# Patient Record
Sex: Male | Born: 1945 | Smoking: Former smoker
Health system: Southern US, Community
[De-identification: ages and names within clinical notes are randomized; demographics above are authoritative.]

## PROBLEM LIST (undated history)

## (undated) DIAGNOSIS — J9691 Respiratory failure, unspecified with hypoxia: Secondary | ICD-10-CM

## (undated) DIAGNOSIS — E7800 Pure hypercholesterolemia, unspecified: Secondary | ICD-10-CM

## (undated) DIAGNOSIS — M1990 Unspecified osteoarthritis, unspecified site: Secondary | ICD-10-CM

## (undated) DIAGNOSIS — N200 Calculus of kidney: Secondary | ICD-10-CM

## (undated) DIAGNOSIS — E669 Obesity, unspecified: Secondary | ICD-10-CM

## (undated) DIAGNOSIS — I10 Essential (primary) hypertension: Secondary | ICD-10-CM

## (undated) DIAGNOSIS — I509 Heart failure, unspecified: Secondary | ICD-10-CM

## (undated) DIAGNOSIS — G4730 Sleep apnea, unspecified: Secondary | ICD-10-CM

---

## 2005-06-22 ENCOUNTER — Ambulatory Visit: Payer: Self-pay | Admitting: Gastroenterology

## 2005-07-06 ENCOUNTER — Ambulatory Visit: Payer: Self-pay | Admitting: Gastroenterology

## 2010-12-28 ENCOUNTER — Other Ambulatory Visit: Payer: Self-pay | Admitting: Cardiology

## 2010-12-28 ENCOUNTER — Ambulatory Visit
Admission: RE | Admit: 2010-12-28 | Discharge: 2010-12-28 | Disposition: A | Discharge status: Home / Self Care | Payer: Medicare Other | Source: Ambulatory Visit | Attending: Cardiology | Admitting: Cardiology

## 2010-12-28 DIAGNOSIS — R0602 Shortness of breath: Secondary | ICD-10-CM

## 2011-02-08 ENCOUNTER — Ambulatory Visit: Payer: Medicare Other | Admitting: *Deleted

## 2011-08-16 ENCOUNTER — Telehealth: Payer: Self-pay | Admitting: Internal Medicine

## 2011-08-16 NOTE — Telephone Encounter (Signed)
lmonvm adviisng the pt of his new pt appt in may with dr Julien Nordmann and for him to return my call to confirm the message

## 2011-08-18 ENCOUNTER — Telehealth: Payer: Self-pay | Admitting: Internal Medicine

## 2011-08-18 NOTE — Telephone Encounter (Signed)
Dave Brown called from dr Donato Heinz office regarding the pt is aware of the appt on 08/30/2011 with dr Julien Nordmann.

## 2011-08-19 ENCOUNTER — Telehealth: Payer: Self-pay | Admitting: Internal Medicine

## 2011-08-19 NOTE — Telephone Encounter (Signed)
Referred by Dr. Corliss Parish Dx- M-Spike

## 2011-08-30 ENCOUNTER — Other Ambulatory Visit (HOSPITAL_BASED_OUTPATIENT_CLINIC_OR_DEPARTMENT_OTHER): Payer: Medicare Other | Admitting: Lab

## 2011-08-30 ENCOUNTER — Ambulatory Visit (HOSPITAL_BASED_OUTPATIENT_CLINIC_OR_DEPARTMENT_OTHER): Payer: Medicare Other | Admitting: Internal Medicine

## 2011-08-30 ENCOUNTER — Telehealth: Payer: Self-pay | Admitting: Internal Medicine

## 2011-08-30 ENCOUNTER — Ambulatory Visit: Payer: Medicare Other

## 2011-08-30 VITALS — BP 122/81 | HR 74 | Temp 97.9°F | Ht 70.5 in | Wt 283.0 lb

## 2011-08-30 DIAGNOSIS — D472 Monoclonal gammopathy: Secondary | ICD-10-CM

## 2011-08-30 DIAGNOSIS — I10 Essential (primary) hypertension: Secondary | ICD-10-CM

## 2011-08-30 DIAGNOSIS — E785 Hyperlipidemia, unspecified: Secondary | ICD-10-CM

## 2011-08-30 LAB — CBC WITH DIFFERENTIAL/PLATELET
BASO%: 0.7 % (ref 0.0–2.0)
EOS%: 4 % (ref 0.0–7.0)
LYMPH%: 33.2 % (ref 14.0–49.0)
MCHC: 34.6 g/dL (ref 32.0–36.0)
MCV: 92.6 fL (ref 79.3–98.0)
MONO%: 10.5 % (ref 0.0–14.0)
Platelets: 167 10*3/uL (ref 140–400)
RBC: 4.89 10*6/uL (ref 4.20–5.82)
WBC: 8.4 10*3/uL (ref 4.0–10.3)
nRBC: 0 % (ref 0–0)

## 2011-08-30 NOTE — Telephone Encounter (Signed)
appts made and printed for pt,pt aware that rad. Will all with bmbx time

## 2011-08-30 NOTE — Progress Notes (Signed)
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  HPI Tamim Puma is a 66 y.o. male was past medical history significant for hypertension, dyslipidemia, degenerative joint disease, sleep apnea, history of kidney stones as well as borderline diabetes mellitus. The patient has a long history of hypertension but she was not followed by a physician for several years. He was recently seen by her primary care physician and was found to have renal insufficiency. The patient was referred to Dr. Moshe Cipro. Sure to several studies including serum protein electrophoreses which showed elevated M spike of 0.9 g/dL. Quantitative immunoglobulin showed IgG was 1941, IgA 213 and IgM 96. Immunofixation showed IgG monoclonal protein with Light chain specificity. She referred the patient to me today for evaluation and recommendation regarding this abnormality and to rule out multiple myeloma. The patient is feeling fine with no specific complaints. He denied having any significant weight loss or night sweats, no aching pain. He has some shortness of breath with exertion secondary to overweight.  Past medical history: Significant for hypertension, dyslipidemia, degenerative joint disease, borderline diabetes mellitus, sleep apnea and a history of kidney stones. The patient denied having any history of coronary artery disease or stroke  Family history: Mother had bone cancer and father had cancer of unknown type.  Social history: He is married and has 4 children. He was accompanied his wife Jenny Reichmann today. He used to work in farming as well as Architect. He has a history of smoking and but quit 20 years ago. No history of alcohol or drug abuse.    Current Outpatient Prescriptions  Medication Sig Dispense Refill  . amLODipine (NORVASC) 5 MG tablet Take 5 mg by mouth daily.      .  hydrochlorothiazide (HYDRODIURIL) 25 MG tablet Take 25 mg by mouth daily.      Marland Kitchen lisinopril (PRINIVIL,ZESTRIL) 10 MG tablet Take 10 mg by mouth daily.      . metoprolol (LOPRESSOR) 50 MG tablet Take 50 mg by mouth 2 (two) times daily.      . rosuvastatin (CRESTOR) 10 MG tablet Take 10 mg by mouth daily.        Review of Systems  A comprehensive review of systems was negative.  Physical Exam  [id number], healthy, no distress, well nourished and well developed SKIN: skin color, texture, turgor are normal HEAD: Normocephalic, No masses, lesions, tenderness or abnormalities EYES: normal EARS: External ears normal OROPHARYNX:no exudate and no erythema  NECK: supple, no adenopathy LYMPH:  no palpable lymphadenopathy, no hepatosplenomegaly LUNGS: clear to auscultation  HEART: regular rate & rhythm, no murmurs and no gallops ABDOMEN:abdomen soft, non-tender, normal bowel sounds and no masses or organomegaly BACK: Back symmetric, no curvature. EXTREMITIES:no joint deformities, effusion, or inflammation, no edema, no skin discoloration, no clubbing, no cyanosis  NEURO: alert & oriented x 3 with fluent speech, no focal motor/sensory deficits  PERFORMANCE STATUS: ECOG   Studies/Results: No results found.   ASSESSMENT: This is a very pleasant 66 years old white male was that monoclonal paraproteinemia to rule out multiple myeloma.  PLAN: I have a lengthy discussion with the patient today about his current condition. I ordered several studies today including repeat CBC, commence metabolic panel, LDH, myeloma panel. I also referred the patient to interventional radiology for consideration of bone marrow biopsy and aspirate. Worse in the specimen for morphology, flow cytometry, cytogenetics and myeloma panel. I  would see the patient back for followup visit in 3 weeks for evaluation and discussion of his scan and biopsy results.  All questions were answered. The patient knows to call the clinic  with any problems, questions or concerns. We can certainly see the patient much sooner if necessary.  Thank you so much for allowing me to participate in the care of Dave Brown. I will continue to follow up the patient with you and assist in his care.  I spent 25 minutes counseling the patient face to face. The total time spent in the appointment was 50 minutes.   Carime Dinkel K. 08/30/2011, 2:50 PM

## 2011-09-02 ENCOUNTER — Other Ambulatory Visit: Payer: Self-pay | Admitting: Radiology

## 2011-09-02 LAB — COMPREHENSIVE METABOLIC PANEL
ALT: 12 U/L (ref 0–53)
AST: 17 U/L (ref 0–37)
Alkaline Phosphatase: 85 U/L (ref 39–117)
Creatinine, Ser: 1.94 mg/dL — ABNORMAL HIGH (ref 0.50–1.35)
Total Bilirubin: 0.6 mg/dL (ref 0.3–1.2)

## 2011-09-02 LAB — SPEP & IFE WITH QIG
Albumin ELP: 52.1 % — ABNORMAL LOW (ref 55.8–66.1)
Alpha-2-Globulin: 12.5 % — ABNORMAL HIGH (ref 7.1–11.8)
Beta Globulin: 5.5 % (ref 4.7–7.2)
Total Protein, Serum Electrophoresis: 7.9 g/dL (ref 6.0–8.3)

## 2011-09-02 LAB — BETA 2 MICROGLOBULIN, SERUM: Beta-2 Microglobulin: 4.3 mg/L — ABNORMAL HIGH (ref 1.01–1.73)

## 2011-09-02 LAB — KAPPA/LAMBDA LIGHT CHAINS: Kappa free light chain: 40 mg/dL — ABNORMAL HIGH (ref 0.33–1.94)

## 2011-09-05 ENCOUNTER — Ambulatory Visit (HOSPITAL_COMMUNITY)
Admission: RE | Admit: 2011-09-05 | Discharge: 2011-09-05 | Disposition: A | Discharge status: Home / Self Care | Payer: Medicare Other | Source: Ambulatory Visit | Attending: Internal Medicine | Admitting: Internal Medicine

## 2011-09-05 ENCOUNTER — Encounter (HOSPITAL_COMMUNITY): Payer: Self-pay

## 2011-09-05 DIAGNOSIS — R7309 Other abnormal glucose: Secondary | ICD-10-CM | POA: Insufficient documentation

## 2011-09-05 DIAGNOSIS — I10 Essential (primary) hypertension: Secondary | ICD-10-CM | POA: Insufficient documentation

## 2011-09-05 DIAGNOSIS — G4730 Sleep apnea, unspecified: Secondary | ICD-10-CM | POA: Insufficient documentation

## 2011-09-05 DIAGNOSIS — E785 Hyperlipidemia, unspecified: Secondary | ICD-10-CM | POA: Insufficient documentation

## 2011-09-05 DIAGNOSIS — D472 Monoclonal gammopathy: Secondary | ICD-10-CM

## 2011-09-05 HISTORY — DX: Calculus of kidney: N20.0

## 2011-09-05 HISTORY — DX: Sleep apnea, unspecified: G47.30

## 2011-09-05 HISTORY — DX: Unspecified osteoarthritis, unspecified site: M19.90

## 2011-09-05 HISTORY — DX: Essential (primary) hypertension: I10

## 2011-09-05 HISTORY — DX: Pure hypercholesterolemia, unspecified: E78.00

## 2011-09-05 HISTORY — DX: Obesity, unspecified: E66.9

## 2011-09-05 LAB — CBC
Hemoglobin: 14.9 g/dL (ref 13.0–17.0)
MCH: 31.4 pg (ref 26.0–34.0)
RBC: 4.75 MIL/uL (ref 4.22–5.81)
WBC: 8.1 10*3/uL (ref 4.0–10.5)

## 2011-09-05 LAB — PROTIME-INR
INR: 0.98 (ref 0.00–1.49)
Prothrombin Time: 13.2 seconds (ref 11.6–15.2)

## 2011-09-05 MED ORDER — FENTANYL CITRATE 0.05 MG/ML IJ SOLN
INTRAMUSCULAR | Status: AC | PRN
  Administered 2011-09-05: 50 ug via INTRAVENOUS
  Administered 2011-09-05: 100 ug via INTRAVENOUS

## 2011-09-05 MED ORDER — MIDAZOLAM HCL 5 MG/5ML IJ SOLN
INTRAMUSCULAR | Status: AC | PRN
  Administered 2011-09-05: 1 mg via INTRAVENOUS
  Administered 2011-09-05: 2 mg via INTRAVENOUS

## 2011-09-05 MED ORDER — SODIUM CHLORIDE 0.9 % IV SOLN
Freq: Once | INTRAVENOUS | Status: AC
  Administered 2011-09-05: 08:00:00 via INTRAVENOUS

## 2011-09-05 NOTE — Procedures (Signed)
CT Left iliac bone marrow bx No complication No blood loss. See complete dictation in [facility].

## 2011-09-05 NOTE — H&P (Signed)
Dave Brown is an 66 y.o. male.   Chief Complaint: "I'm here for a bone marrow biopsy" HPI: Patient with history of monoclonal paraproteinemia presents today for CT guided bone marrow biopsy to r/o multiple myeloma.  PMH: hypertension, dyslipidemia, DJD, borderline DM, sleep apnea, kidney stones, renal insufficiency No past surgical history on file. Family History:  Mother with bone cancer; father had cancer of unknown type                                                                                                                                    Social History: Prior smoker, lives in [place]. Married with 4 children. Previously worked in Conservation officer, historic buildings.No alcohol use.  Allergies: Not on File  Current outpatient prescriptions:amLODipine (NORVASC) 5 MG tablet, Take 5 mg by mouth daily., Disp: , Rfl: ;  hydrochlorothiazide (HYDRODIURIL) 25 MG tablet, Take 25 mg by mouth daily., Disp: , Rfl: ;  lisinopril (PRINIVIL,ZESTRIL) 10 MG tablet, Take 10 mg by mouth daily., Disp: , Rfl: ;  metoprolol (LOPRESSOR) 50 MG tablet, Take 50 mg by mouth 2 (two) times daily., Disp: , Rfl:  rosuvastatin (CRESTOR) 10 MG tablet, Take 10 mg by mouth daily., Disp: , Rfl:  Current facility-administered medications:0.9 %  sodium chloride infusion, , Intravenous, Once, Ascencion Dike, PA  Results for orders placed in visit on 08/30/11  CBC WITH DIFFERENTIAL      Component Value Range   WBC 8.4  4.0 - 10.3 (10e3/uL)   NEUT# 4.4  1.5 - 6.5 (10e3/uL)   HGB 15.7  13.0 - 17.1 (g/dL)   HCT 45.3  38.4 - 49.9 (%)   Platelets 167  140 - 400 (10e3/uL)   MCV 92.6  79.3 - 98.0 (fL)   MCH 32.0  27.2 - 33.4 (pg)   MCHC 34.6  32.0 - 36.0 (g/dL)   RBC 4.89  4.20 - 5.82 (10e6/uL)   RDW 14.3  11.0 - 14.6 (%)   lymph# 2.8  0.9 - 3.3 (10e3/uL)   MONO# 0.9  0.1 - 0.9 (10e3/uL)   Eosinophils Absolute 0.3  0.0 - 0.5 (10e3/uL)   Basophils Absolute 0.1  0.0 - 0.1 (10e3/uL)   NEUT% 51.6  39.0 - 75.0 (%)   LYMPH% 33.2  14.0  - 49.0 (%)   MONO% 10.5  0.0 - 14.0 (%)   EOS% 4.0  0.0 - 7.0 (%)   BASO% 0.7  0.0 - 2.0 (%)   nRBC 0  0 - 0 (%)  COMPREHENSIVE METABOLIC PANEL      Component Value Range   Sodium 136  135 - 145 (mEq/L)   Potassium 3.6  3.5 - 5.3 (mEq/L)   Chloride 99  96 - 112 (mEq/L)   CO2 26  19 - 32 (mEq/L)   Glucose, Bld 91  70 - 99 (mg/dL)   BUN 34 (*) 6 - 23 (mg/dL)   Creatinine, Ser 1.94 (*) 0.50 -  1.35 (mg/dL)   Total Bilirubin 0.6  0.3 - 1.2 (mg/dL)   Alkaline Phosphatase 85  39 - 117 (U/L)   AST 17  0 - 37 (U/L)   ALT 12  0 - 53 (U/L)   Total Protein 7.9  6.0 - 8.3 (g/dL)   Albumin 4.2  3.5 - 5.2 (g/dL)   Calcium 9.8  8.4 - 10.5 (mg/dL)  LACTATE DEHYDROGENASE      Component Value Range   LDH 133  94 - 250 (U/L)  BETA 2 MICROGLOBULINE, SERUM      Component Value Range   Beta-2 Microglobulin 4.30 (*) 1.01 - 1.73 (mg/L)  KAPPA/LAMBDA LIGHT CHAINS      Component Value Range   Kappa free light chain 40.00 (*) 0.33 - 1.94 (mg/dL)   Lambda Free Lght Chn 2.65 (*) 0.57 - 2.63 (mg/dL)   Kappa:Lambda Ratio 15.09 (*) 0.26 - 1.65   SPEP & IFE WITH QIG      Component Value Range   IgG (Immunoglobin G), Serum 1980 (*) 650 - 1600 (mg/dL)   IgA 202  68 - 379 (mg/dL)   IgM, Serum 90  41 - 251 (mg/dL)   Immunofix Electr Int *     Total Protein, serum electrophor 7.9  6.0 - 8.3 (g/dL)   Albumin ELP 52.1 (*) 55.8 - 66.1 (%)   Alpha-1-Globulin 4.7  2.9 - 4.9 (%)   Alpha-2-Globulin 12.5 (*) 7.1 - 11.8 (%)   Beta Globulin 5.5  4.7 - 7.2 (%)   Beta 2 4.5  3.2 - 6.5 (%)   Gamma Globulin 20.7 (*) 11.1 - 18.8 (%)   M-Spike, % 0.75     SPE Interp. *     COMMENT (PROTEIN ELECTROPHOR) *      Review of Systems  Constitutional: Positive for malaise/fatigue. Negative for fever and chills.  Respiratory: Positive for cough and shortness of breath.   Cardiovascular: Negative for chest pain.  Gastrointestinal: Negative for nausea, vomiting and abdominal pain.  Musculoskeletal: Positive for back pain.    Neurological: Negative for headaches.  Endo/Heme/Allergies: Does not bruise/bleed easily.    Blood pressure 141/81, pulse 88, temperature 97.4 F (36.3 C), temperature source Oral, resp. rate 20, height 5\' 10"  (1.778 m), weight 285 lb (129.275 kg), SpO2 97.00%. Physical Exam  Constitutional: He is oriented to person, place, and time. He appears well-developed and well-nourished.  Cardiovascular: Normal rate and regular rhythm.   Respiratory: Effort normal and breath sounds normal.  GI: Soft. Bowel sounds are normal. There is no tenderness.       obese  Musculoskeletal: Normal range of motion. He exhibits no edema.  Neurological: He is alert and oriented to person, place, and time.     Assessment/Plan Patient with monoclonal paraproteinemia; plan is for CT guided bone marrow biopsy to rule out multiple myeloma.Details/risks of procedure d/w pt with his understanding and consent.  Kela Baccari,D KEVIN 09/05/2011, 8:28 AM

## 2011-09-05 NOTE — Discharge Instructions (Signed)
Biopsy Care After Refer to this sheet in the next few weeks. These instructions provide you with information on caring for yourself after your procedure. Your caregiver may also give you more specific instructions. Your treatment has been planned according to current medical practices, but problems sometimes occur. Call your caregiver if you have any problems or questions after your procedure. If you had a fine needle biopsy, you may have soreness at the biopsy site for 1 to 2 days. If you had an open biopsy, you may have soreness at the biopsy site for 3 to 4 days. HOME CARE INSTRUCTIONS   You may resume normal diet and activities as directed.   Change bandages (dressings) as directed. If your wound was closed with a skin glue (adhesive), it will wear off and begin to peel in 7 days.   Only take over-the-counter or prescription medicines for pain, discomfort, or fever as directed by your caregiver.   Ask your caregiver when you can bathe and get your wound wet.  SEEK IMMEDIATE MEDICAL CARE IF:   You have increased bleeding (more than a small spot) from the biopsy site.   You notice redness, swelling, or increasing pain at the biopsy site.   You have pus coming from the biopsy site.   You have a fever.   You notice a bad smell coming from the biopsy site or dressing.   You have a rash, have difficulty breathing, or have any allergic problems.  MAKE SURE YOU:   Understand these instructions.   Will watch your condition.   Will get help right away if you are not doing well or get worse.  Document Released: 10/29/2004 Document Revised: 03/31/2011 Document Reviewed: 10/07/2010 [facility] Patient Information 2012 [place].

## 2011-09-09 ENCOUNTER — Telehealth: Payer: Self-pay | Admitting: Medical Oncology

## 2011-09-09 NOTE — Telephone Encounter (Signed)
Requests test results from Bone marrow biopsy. I returned wife's call and told her that the results are reviewed at f/u  appointment with Dr. Julien Nordmann. She voices understanding.

## 2011-09-21 ENCOUNTER — Telehealth: Payer: Self-pay | Admitting: Internal Medicine

## 2011-09-21 ENCOUNTER — Ambulatory Visit (HOSPITAL_BASED_OUTPATIENT_CLINIC_OR_DEPARTMENT_OTHER): Payer: Medicare Other | Admitting: Internal Medicine

## 2011-09-21 VITALS — BP 133/89 | HR 80 | Temp 97.9°F | Ht 70.0 in | Wt 284.3 lb

## 2011-09-21 DIAGNOSIS — D472 Monoclonal gammopathy: Secondary | ICD-10-CM

## 2011-09-21 NOTE — Progress Notes (Signed)
[place] Telephone:(336) [phone]   Fax:(336) [phone]  OFFICE PROGRESS NOTE  No primary provider on file. No primary provider on file.  DIAGNOSIS: Monoclonal gammopathy of undetermined significance (MGUS).  PRIOR THERAPY: None  CURRENT THERAPY: None  INTERVAL HISTORY: Dave Brown 66 y.o. male returns to the clinic today for followup visit accompanied by his wife. The patient is doing fine today with no specific complaints. He underwent several studies since his initial evaluation including myeloma panel as well as bone marrow biopsy and aspirate. He is here today for evaluation and discussion of his lab and biopsy results. He denied having any significant complaints except for shortness breath with exertion.  MEDICAL HISTORY: Past Medical History  Diagnosis Date  . Hypertension   . Obesity   . High cholesterol   . Sleep apnea   . Degenerative joint disease   . Kidney stones     ALLERGIES:   has no known allergies.  MEDICATIONS:  Current Outpatient Prescriptions  Medication Sig Dispense Refill  . amLODipine (NORVASC) 5 MG tablet Take 5 mg by mouth daily.      . hydrochlorothiazide (HYDRODIURIL) 25 MG tablet Take 25 mg by mouth daily.      Marland Kitchen lisinopril (PRINIVIL,ZESTRIL) 10 MG tablet Take 10 mg by mouth daily.      . metoprolol (LOPRESSOR) 50 MG tablet Take 50 mg by mouth 2 (two) times daily.      . rosuvastatin (CRESTOR) 10 MG tablet Take 10 mg by mouth daily.       REVIEW OF SYSTEMS:  A comprehensive review of systems was negative except for: Respiratory: positive for dyspnea on exertion   PHYSICAL EXAMINATION: General appearance: alert, cooperative and no distress Neck: no adenopathy Lymph nodes: Cervical, supraclavicular, and axillary nodes normal. Resp: clear to auscultation bilaterally Cardio: regular rate and rhythm, S1, S2 normal, no murmur, click, rub or gallop GI: soft, non-tender; bowel sounds normal; no masses,  no  organomegaly Extremities: extremities normal, atraumatic, no cyanosis or edema  ECOG PERFORMANCE STATUS: 1 - Symptomatic but completely ambulatory  Blood pressure 133/89, pulse 80, temperature 97.9 F (36.6 C), temperature source Oral, height 5\' 10"  (1.778 m), weight 284 lb 4.8 oz (128.958 kg).  LABORATORY DATA: Lab Results  Component Value Date   WBC 8.1 09/05/2011   HGB 14.9 09/05/2011   HCT 42.6 09/05/2011   MCV 89.7 09/05/2011   PLT 155 09/05/2011      Chemistry      Component Value Date/Time   NA 136 08/30/2011 1346   K 3.6 08/30/2011 1346   CL 99 08/30/2011 1346   CO2 26 08/30/2011 1346   BUN 34* 08/30/2011 1346   CREATININE 1.94* 08/30/2011 1346      Component Value Date/Time   CALCIUM 9.8 08/30/2011 1346   ALKPHOS 85 08/30/2011 1346   AST 17 08/30/2011 1346   ALT 12 08/30/2011 1346   BILITOT 0.6 08/30/2011 1346       RADIOGRAPHIC STUDIES: Ct Biopsy  09/05/2011  *RADIOLOGY REPORT*  Clinical data: Monoclonal paraproteinemia  CT GUIDED DEEP ILIAC BONE ASPIRATION AND CORE BIOPSY:  Technique: The procedure, risks (including but not limited to bleeding, infection, organ damage), benefits, and alternatives were explained to the patient.  Questions regarding the procedure were encouraged and answered.  The patient understands and consents to the procedure. Patient was placed supine on the CT gantry and limited axial scans through the pelvis were obtained. Appropriate skin entry site was identified.  Skin site was marked, prepped with Betadine,   draped in usual sterile fashion, and infiltrated locally with 1% lidocaine.  Intravenous Fentanyl and Versed were administered as conscious sedation during continuous cardiorespiratory monitoring by the radiology RN, with a total moderate sedation time of 10 minutes.  Under CT fluoroscopic guidance an 11-gauge Cook trocar bone needle was advanced into the  iliac bone just lateral to the sacroiliac joint. Once needle tip position was confirmed, coaxial core and  aspiration samples were obtained. The final sample was obtained using the guiding needle itself, which was then removed. Postprocedure scans show no hematoma or fracture. Patient tolerated procedure well, with no immediate complication.  IMPRESSION:  1. Technically successful CT guided  iliac bone core and aspiration biopsy.  Original Report Authenticated By: Trecia Rogers, M.D.    ASSESSMENT: This is a very pleasant 66 years old white male with monoclonal gammopathy of undetermined significance. The bone marrow biopsy and aspirate showed only 1% plasma cells, but his protein study showed elevated IgG level as well as free kappa light chain.  PLAN: I discussed the lab and biopsy results with the patient and his wife. I recommended for him continuous observation for now with repeat CBC, comprehensive metabolic panel, LDH and myeloma panel in 3 months. The patient was advised to call me immediately if he has any concerning symptoms in the interval.  All questions were answered. The patient knows to call the clinic with any problems, questions or concerns. We can certainly see the patient much sooner if necessary.  I spent 15 minutes counseling the patient face to face. The total time spent in the appointment was 25 minutes .

## 2011-09-21 NOTE — Telephone Encounter (Signed)
Gave pt appt for August 2013 lab before MD visit

## 2011-12-16 ENCOUNTER — Other Ambulatory Visit (HOSPITAL_BASED_OUTPATIENT_CLINIC_OR_DEPARTMENT_OTHER): Payer: Medicare Other | Admitting: Lab

## 2011-12-16 DIAGNOSIS — D472 Monoclonal gammopathy: Secondary | ICD-10-CM

## 2011-12-16 LAB — CBC WITH DIFFERENTIAL/PLATELET
Basophils Absolute: 0 10*3/uL (ref 0.0–0.1)
Eosinophils Absolute: 0.2 10*3/uL (ref 0.0–0.5)
HGB: 13.8 g/dL (ref 13.0–17.1)
MCV: 93.5 fL (ref 79.3–98.0)
MONO#: 0.6 10*3/uL (ref 0.1–0.9)
NEUT#: 3.1 10*3/uL (ref 1.5–6.5)
RBC: 4.3 10*6/uL (ref 4.20–5.82)
RDW: 13.8 % (ref 11.0–14.6)
WBC: 6.4 10*3/uL (ref 4.0–10.3)
lymph#: 2.4 10*3/uL (ref 0.9–3.3)

## 2011-12-20 LAB — COMPREHENSIVE METABOLIC PANEL
Albumin: 4 g/dL (ref 3.5–5.2)
BUN: 25 mg/dL — ABNORMAL HIGH (ref 6–23)
CO2: 25 mEq/L (ref 19–32)
Calcium: 9.4 mg/dL (ref 8.4–10.5)
Chloride: 104 mEq/L (ref 96–112)
Glucose, Bld: 123 mg/dL — ABNORMAL HIGH (ref 70–99)
Potassium: 3.8 mEq/L (ref 3.5–5.3)
Sodium: 138 mEq/L (ref 135–145)
Total Protein: 7.5 g/dL (ref 6.0–8.3)

## 2011-12-20 LAB — SPEP & IFE WITH QIG
Albumin ELP: 51 % — ABNORMAL LOW (ref 55.8–66.1)
Alpha-2-Globulin: 12.2 % — ABNORMAL HIGH (ref 7.1–11.8)
Beta Globulin: 5.6 % (ref 4.7–7.2)
IgG (Immunoglobin G), Serum: 1730 mg/dL — ABNORMAL HIGH (ref 650–1600)
M-Spike, %: 0.76 g/dL
Total Protein, Serum Electrophoresis: 7.5 g/dL (ref 6.0–8.3)

## 2011-12-20 LAB — BETA 2 MICROGLOBULIN, SERUM: Beta-2 Microglobulin: 3.83 mg/L — ABNORMAL HIGH (ref 1.01–1.73)

## 2011-12-20 LAB — KAPPA/LAMBDA LIGHT CHAINS: Kappa free light chain: 49.6 mg/dL — ABNORMAL HIGH (ref 0.33–1.94)

## 2011-12-21 ENCOUNTER — Telehealth: Payer: Self-pay | Admitting: Internal Medicine

## 2011-12-21 ENCOUNTER — Ambulatory Visit (HOSPITAL_BASED_OUTPATIENT_CLINIC_OR_DEPARTMENT_OTHER): Payer: Medicare Other | Admitting: Internal Medicine

## 2011-12-21 VITALS — BP 117/52 | HR 80 | Temp 98.4°F | Resp 20 | Ht 70.0 in | Wt 281.2 lb

## 2011-12-21 DIAGNOSIS — D472 Monoclonal gammopathy: Secondary | ICD-10-CM | POA: Insufficient documentation

## 2011-12-21 NOTE — Patient Instructions (Signed)
Follow up in 6 months 

## 2011-12-21 NOTE — Telephone Encounter (Signed)
Gave pt appt calendar for February and March 2014 lab and MD

## 2011-12-21 NOTE — Progress Notes (Signed)
[place] Telephone:(336) [phone]   Fax:(336) [phone]  OFFICE PROGRESS NOTE  DIAGNOSIS: Monoclonal gammopathy of undetermined significance (MGUS).   PRIOR THERAPY: None   CURRENT THERAPY: Observation.  INTERVAL HISTORY: Colie Castor 66 y.o. male returns to the clinic today for routine three-month followup visit accompanied by his wife. The patient is feeling fine with no specific complaints. He denied having any significant weight loss or night sweats. He has no chest pain, shortness breath, cough or hemoptysis. The patient denied having any bleeding issues. He denied having any aching pain. He has repeat CBC, comprehensive metabolic panel, LDH and myeloma panel performed recently and he is here today for evaluation and discussion of his lab results.  MEDICAL HISTORY: Past Medical History  Diagnosis Date  . Hypertension   . Obesity   . High cholesterol   . Sleep apnea   . Degenerative joint disease   . Kidney stones     ALLERGIES:   has no known allergies.  MEDICATIONS:  Current Outpatient Prescriptions  Medication Sig Dispense Refill  . amLODipine (NORVASC) 5 MG tablet Take 5 mg by mouth daily.      . hydrochlorothiazide (HYDRODIURIL) 25 MG tablet Take 25 mg by mouth daily.      Marland Kitchen lisinopril (PRINIVIL,ZESTRIL) 10 MG tablet Take 10 mg by mouth daily.      . metoprolol (LOPRESSOR) 50 MG tablet Take 50 mg by mouth 2 (two) times daily.      . rosuvastatin (CRESTOR) 10 MG tablet Take 10 mg by mouth daily.        REVIEW OF SYSTEMS:  A comprehensive review of systems was negative.   PHYSICAL EXAMINATION: General appearance: alert, cooperative and no distress Head: Normocephalic, without obvious abnormality, atraumatic Neck: no adenopathy Lymph nodes: Cervical, supraclavicular, and axillary nodes normal. Resp: clear to auscultation bilaterally Cardio: regular rate and rhythm, S1, S2 normal, no murmur, click, rub or gallop GI: soft, non-tender; bowel sounds  normal; no masses,  no organomegaly Extremities: extremities normal, atraumatic, no cyanosis or edema  ECOG PERFORMANCE STATUS: 1 - Symptomatic but completely ambulatory  Blood pressure 117/52, pulse 80, temperature 98.4 F (36.9 C), temperature source Oral, resp. rate 20, height 5\' 10"  (1.778 m), weight 281 lb 3.2 oz (127.551 kg).  LABORATORY DATA: Lab Results  Component Value Date   WBC 6.4 12/16/2011   HGB 13.8 12/16/2011   HCT 40.2 12/16/2011   MCV 93.5 12/16/2011   PLT 130* 12/16/2011      Chemistry      Component Value Date/Time   NA 138 12/16/2011 1000   K 3.8 12/16/2011 1000   CL 104 12/16/2011 1000   CO2 25 12/16/2011 1000   BUN 25* 12/16/2011 1000   CREATININE 1.88* 12/16/2011 1000      Component Value Date/Time   CALCIUM 9.4 12/16/2011 1000   ALKPHOS 75 12/16/2011 1000   AST 15 12/16/2011 1000   ALT 13 12/16/2011 1000   BILITOT 0.6 12/16/2011 1000       RADIOGRAPHIC STUDIES: No results found.  ASSESSMENT: This is a very pleasant 66 years old white male with monoclonal gammopathy of undetermined significance (MGUS) currently on observation. The patient is doing fine and he has no evidence for disease progression based on the recent myeloma panel.  PLAN: I discussed the lab result with the patient and his wife. I recommended for him to continue on observation for now. I would see him back for followup visit in 6  months with repeat CBC, comprehensive metabolic panel, LDH and myeloma panel. The patient was advised to call me immediately if he has any concerning symptoms in the interval.  All questions were answered. The patient knows to call the clinic with any problems, questions or concerns. We can certainly see the patient much sooner if necessary.

## 2012-06-22 ENCOUNTER — Other Ambulatory Visit (HOSPITAL_BASED_OUTPATIENT_CLINIC_OR_DEPARTMENT_OTHER): Payer: Medicare Other | Admitting: Lab

## 2012-06-22 DIAGNOSIS — D472 Monoclonal gammopathy: Secondary | ICD-10-CM

## 2012-06-22 LAB — COMPREHENSIVE METABOLIC PANEL (CC13)
ALT: 13 U/L (ref 0–55)
AST: 18 U/L (ref 5–34)
Albumin: 3.6 g/dL (ref 3.5–5.0)
Alkaline Phosphatase: 80 U/L (ref 40–150)
Chloride: 104 mEq/L (ref 98–107)
Potassium: 3.7 mEq/L (ref 3.5–5.1)
Sodium: 138 mEq/L (ref 136–145)
Total Protein: 7.8 g/dL (ref 6.4–8.3)

## 2012-06-22 LAB — CBC WITH DIFFERENTIAL/PLATELET
Basophils Absolute: 0 10*3/uL (ref 0.0–0.1)
EOS%: 3.4 % (ref 0.0–7.0)
HCT: 41.6 % (ref 38.4–49.9)
HGB: 14.2 g/dL (ref 13.0–17.1)
MCH: 31.4 pg (ref 27.2–33.4)
MCV: 91.8 fL (ref 79.3–98.0)
MONO%: 9.3 % (ref 0.0–14.0)
NEUT%: 48.1 % (ref 39.0–75.0)
RDW: 14.4 % (ref 11.0–14.6)

## 2012-06-25 ENCOUNTER — Encounter: Payer: Self-pay | Admitting: Internal Medicine

## 2012-06-25 ENCOUNTER — Ambulatory Visit (HOSPITAL_BASED_OUTPATIENT_CLINIC_OR_DEPARTMENT_OTHER): Payer: Medicare Other | Admitting: Internal Medicine

## 2012-06-25 ENCOUNTER — Telehealth: Payer: Self-pay | Admitting: Internal Medicine

## 2012-06-25 VITALS — BP 150/70 | HR 84 | Temp 98.1°F | Resp 20 | Ht 70.0 in | Wt 273.1 lb

## 2012-06-25 DIAGNOSIS — D472 Monoclonal gammopathy: Secondary | ICD-10-CM

## 2012-06-25 LAB — IGG, IGA, IGM
IgA: 207 mg/dL (ref 68–379)
IgG (Immunoglobin G), Serum: 1770 mg/dL — ABNORMAL HIGH (ref 650–1600)

## 2012-06-25 NOTE — Progress Notes (Signed)
[place] [phone]   [phone]  OFFICE PROGRESS NOTE  No primary provider on file. No primary provider on file.  DIAGNOSIS: Monoclonal gammopathy of undetermined significance (MGUS).   PRIOR THERAPY: None   CURRENT THERAPY: Observation.  INTERVAL HISTORY: Dave Brown 67 y.o. male returns to the clinic today for routine six-month followup visit accompanied his wife. The patient is feeling fine today with no specific complaints. He denied having any significant weight loss or night sweats. He has no chest pain, shortness breath, cough or hemoptysis. Has no aching pain. The patient had repeat myeloma panel performed recently and he is here for evaluation and discussion of his lab results. IgG was 1770, IgA 207 and IgM 85  MEDICAL HISTORY: Past Medical History  Diagnosis Date  . Hypertension   . Obesity   . High cholesterol   . Sleep apnea   . Degenerative joint disease   . Kidney stones     ALLERGIES:  has No Known Allergies.  MEDICATIONS:  Current Outpatient Prescriptions  Medication Sig Dispense Refill  . amLODipine (NORVASC) 5 MG tablet Take 5 mg by mouth daily.      . hydrochlorothiazide (HYDRODIURIL) 25 MG tablet Take 25 mg by mouth daily.      Marland Kitchen lisinopril (PRINIVIL,ZESTRIL) 10 MG tablet Take 10 mg by mouth daily.      . metoprolol (LOPRESSOR) 50 MG tablet Take 50 mg by mouth 2 (two) times daily.      . rosuvastatin (CRESTOR) 10 MG tablet Take 10 mg by mouth daily.      . metoprolol succinate (TOPROL-XL) 50 MG 24 hr tablet Take 50 mg by mouth daily.       No current facility-administered medications for this visit.   REVIEW OF SYSTEMS:  A comprehensive review of systems was negative.   PHYSICAL EXAMINATION: General appearance: alert, cooperative and no distress Head: Normocephalic, without obvious abnormality, atraumatic Neck: no adenopathy Lymph nodes: Cervical, supraclavicular, and axillary nodes normal. Resp: clear  to auscultation bilaterally Cardio: regular rate and rhythm, S1, S2 normal, no murmur, click, rub or gallop GI: soft, non-tender; bowel sounds normal; no masses,  no organomegaly Extremities: extremities normal, atraumatic, no cyanosis or edema  ECOG PERFORMANCE STATUS: 0 - Asymptomatic  Blood pressure 150/70, pulse 84, temperature 98.1 F (36.7 C), temperature source Oral, resp. rate 20, height 5\' 10"  (1.778 m), weight 273 lb 1.6 oz (123.877 kg).  LABORATORY DATA: Lab Results  Component Value Date   WBC 7.2 06/22/2012   HGB 14.2 06/22/2012   HCT 41.6 06/22/2012   MCV 91.8 06/22/2012   PLT 131* 06/22/2012      Chemistry      Component Value Date/Time   NA 138 06/22/2012 0901   NA 138 12/16/2011 1000   K 3.7 06/22/2012 0901   K 3.8 12/16/2011 1000   CL 104 06/22/2012 0901   CL 104 12/16/2011 1000   CO2 23 06/22/2012 0901   CO2 25 12/16/2011 1000   BUN 31.7* 06/22/2012 0901   BUN 25* 12/16/2011 1000   CREATININE 2.2* 06/22/2012 0901   CREATININE 1.88* 12/16/2011 1000      Component Value Date/Time   CALCIUM 9.9 06/22/2012 0901   CALCIUM 9.4 12/16/2011 1000   ALKPHOS 80 06/22/2012 0901   ALKPHOS 75 12/16/2011 1000   AST 18 06/22/2012 0901   AST 15 12/16/2011 1000   ALT 13 06/22/2012 0901   ALT 13 12/16/2011 1000   BILITOT  0.67 06/22/2012 0901   BILITOT 0.6 12/16/2011 1000       RADIOGRAPHIC STUDIES: No results found.  ASSESSMENT: This is a very pleasant 67 years old white male with monoclonal gammopathy of undetermined significance. The patient is currently on observation with no evidence for disease progression on his recent blood work.   PLAN: I discussed the lab result with the patient and his wife. I recommended for him to come back for followup visit in 6 months with repeat myeloma panel. The patient was advised to call immediately she has any concerning symptoms in the interval.  All questions were answered. The patient knows to call the clinic with any problems, questions or  concerns. We can certainly see the patient much sooner if necessary.

## 2012-06-25 NOTE — Telephone Encounter (Signed)
Gave pt appt for August ,labs and MD on September 2014

## 2012-06-25 NOTE — Patient Instructions (Signed)
No evidence for disease progression on his recent blood work.  Followup in 6 months with repeat myeloma panel.

## 2012-12-21 ENCOUNTER — Other Ambulatory Visit (HOSPITAL_BASED_OUTPATIENT_CLINIC_OR_DEPARTMENT_OTHER): Payer: Medicare Other | Admitting: Lab

## 2012-12-21 DIAGNOSIS — D472 Monoclonal gammopathy: Secondary | ICD-10-CM

## 2012-12-21 LAB — CBC WITH DIFFERENTIAL/PLATELET
Basophils Absolute: 0 10*3/uL (ref 0.0–0.1)
Eosinophils Absolute: 0.3 10*3/uL (ref 0.0–0.5)
HGB: 14.6 g/dL (ref 13.0–17.1)
MONO#: 0.7 10*3/uL (ref 0.1–0.9)
NEUT#: 3.5 10*3/uL (ref 1.5–6.5)
RDW: 14.5 % (ref 11.0–14.6)
WBC: 7.1 10*3/uL (ref 4.0–10.3)
lymph#: 2.6 10*3/uL (ref 0.9–3.3)

## 2012-12-21 LAB — COMPREHENSIVE METABOLIC PANEL (CC13)
Albumin: 3.6 g/dL (ref 3.5–5.0)
BUN: 29.8 mg/dL — ABNORMAL HIGH (ref 7.0–26.0)
Calcium: 9.6 mg/dL (ref 8.4–10.4)
Chloride: 105 mEq/L (ref 98–109)
Glucose: 109 mg/dl (ref 70–140)
Potassium: 3.8 mEq/L (ref 3.5–5.1)
Total Protein: 8.1 g/dL (ref 6.4–8.3)

## 2012-12-25 ENCOUNTER — Other Ambulatory Visit: Payer: Medicare Other

## 2012-12-25 LAB — BETA 2 MICROGLOBULIN, SERUM: Beta-2 Microglobulin: 3.65 mg/L — ABNORMAL HIGH (ref 1.01–1.73)

## 2012-12-25 LAB — KAPPA/LAMBDA LIGHT CHAINS: Kappa free light chain: 48.1 mg/dL — ABNORMAL HIGH (ref 0.33–1.94)

## 2012-12-25 LAB — IGG, IGA, IGM: IgA: 195 mg/dL (ref 68–379)

## 2012-12-26 ENCOUNTER — Other Ambulatory Visit: Payer: Medicare Other

## 2012-12-27 ENCOUNTER — Encounter: Payer: Self-pay | Admitting: Internal Medicine

## 2012-12-27 ENCOUNTER — Ambulatory Visit (HOSPITAL_BASED_OUTPATIENT_CLINIC_OR_DEPARTMENT_OTHER): Payer: Medicare Other | Admitting: Internal Medicine

## 2012-12-27 ENCOUNTER — Telehealth: Payer: Self-pay | Admitting: Internal Medicine

## 2012-12-27 VITALS — BP 141/85 | HR 78 | Temp 97.0°F | Resp 21 | Ht 70.0 in | Wt 277.4 lb

## 2012-12-27 DIAGNOSIS — D472 Monoclonal gammopathy: Secondary | ICD-10-CM

## 2012-12-27 NOTE — Progress Notes (Signed)
[place] [phone]   [phone]  OFFICE PROGRESS NOTE  Dave Brown [address] [place] [address]  DIAGNOSIS: Monoclonal gammopathy of undetermined significance (MGUS).   PRIOR THERAPY: None   CURRENT THERAPY: Observation.  INTERVAL HISTORY: Dave Brown 67 y.o. male returns to the clinic today for followup visit accompanied by his wife. The patient is feeling fine today with no specific complaints. He denied having any significant weight loss or night sweats. He denied having any palpable lymphadenopathy. He has no nausea or vomiting. He denied having any significant chest pain, shortness breath, cough or hemoptysis. He had repeat myeloma panel performed recently and he is here for evaluation and discussion of his lab results.  MEDICAL HISTORY: Past Medical History  Diagnosis Date  . Hypertension   . Obesity   . High cholesterol   . Sleep apnea   . Degenerative joint disease   . Kidney stones     ALLERGIES:  has No Known Allergies.  MEDICATIONS:  Current Outpatient Prescriptions  Medication Sig Dispense Refill  . amLODipine (NORVASC) 5 MG tablet Take 5 mg by mouth daily.      . hydrochlorothiazide (HYDRODIURIL) 25 MG tablet Take 25 mg by mouth daily.      Marland Kitchen lisinopril (PRINIVIL,ZESTRIL) 10 MG tablet Take 10 mg by mouth daily.      . metoprolol succinate (TOPROL-XL) 50 MG 24 hr tablet Take 50 mg by mouth daily.      . rosuvastatin (CRESTOR) 10 MG tablet Take 10 mg by mouth daily.       No current facility-administered medications for this visit.    REVIEW OF SYSTEMS:  A comprehensive review of systems was negative.   PHYSICAL EXAMINATION: General appearance: alert, cooperative and no distress Head: Normocephalic, without obvious abnormality, atraumatic Neck: no adenopathy Lymph nodes: Cervical, supraclavicular, and axillary nodes normal. Resp: clear to auscultation bilaterally Cardio: regular rate and  rhythm, S1, S2 normal, no murmur, click, rub or gallop GI: soft, non-tender; bowel sounds normal; no masses,  no organomegaly Extremities: extremities normal, atraumatic, no cyanosis or edema  ECOG PERFORMANCE STATUS: 1 - Symptomatic but completely ambulatory  Blood pressure 141/85, pulse 78, temperature 97 F (36.1 C), temperature source Oral, resp. rate 21, height 5\' 10"  (1.778 m), weight 277 lb 6.4 oz (125.828 kg).  LABORATORY DATA: Lab Results  Component Value Date   WBC 7.1 12/21/2012   HGB 14.6 12/21/2012   HCT 42.7 12/21/2012   MCV 91.5 12/21/2012   PLT 131* 12/21/2012      Chemistry      Component Value Date/Time   NA 139 12/21/2012 0924   NA 138 12/16/2011 1000   K 3.8 12/21/2012 0924   K 3.8 12/16/2011 1000   CL 104 06/22/2012 0901   CL 104 12/16/2011 1000   CO2 24 12/21/2012 0924   CO2 25 12/16/2011 1000   BUN 29.8* 12/21/2012 0924   BUN 25* 12/16/2011 1000   CREATININE 2.0* 12/21/2012 0924   CREATININE 1.88* 12/16/2011 1000      Component Value Date/Time   CALCIUM 9.6 12/21/2012 0924   CALCIUM 9.4 12/16/2011 1000   ALKPHOS 77 12/21/2012 0924   ALKPHOS 75 12/16/2011 1000   AST 17 12/21/2012 0924   AST 15 12/16/2011 1000   ALT 14 12/21/2012 0924   ALT 13 12/16/2011 1000   BILITOT 0.52 12/21/2012 0924   BILITOT 0.6 12/16/2011 1000       RADIOGRAPHIC STUDIES: No  results found.  ASSESSMENT AND PLAN: This is a very pleasant 67 years old white male with history of monoclonal neuropathy of undetermined significance currently on observation. The patient has no evidence for disease progression based on the recent myeloma panel. I discussed the lab result with the patient and recommended for him to continue on observation with repeat myeloma panel in 6 months. He was advised to call immediately if he has any concerning symptoms in the interval.  The patient voices understanding of current disease status and treatment options and is in agreement with the current care plan.  All  questions were answered. The patient knows to call the clinic with any problems, questions or concerns. We can certainly see the patient much sooner if necessary.

## 2012-12-27 NOTE — Telephone Encounter (Signed)
gv and printed appt sched and avs forpt for March 2015  °

## 2012-12-29 NOTE — Patient Instructions (Signed)
Followup visit in 6 months with repeat myeloma panel.

## 2013-01-23 IMAGING — CT CT BIOPSY
3 series · 18 of 32 positions shown, 23 images · IV contrast (APPLIED)
Comparison: none

CLINICAL DATA: Monoclonal paraproteinemia

CT GUIDED DEEP ILIAC BONE ASPIRATION AND CORE BIOPSY:
TECHNIQUE: The procedure, risks (including but not limited to
bleeding, infection, organ damage), benefits, and alternatives were
explained to the patient.  Questions regarding the procedure were
encouraged and answered.  The patient understands and consents to
the procedure. Patient was placed supine on the CT gantry and
limited axial scans through the pelvis were obtained. Appropriate
skin entry site was identified. Skin site was marked, prepped with
Betadine,   draped in usual sterile fashion, and infiltrated
locally with 1% lidocaine.

[Series 2: bone (person_name) · axial · 0.86mm/px · z∈[-210,-105]mm · 8 of 26 slices shown (1 of 2)]
[im 3/26  bone]
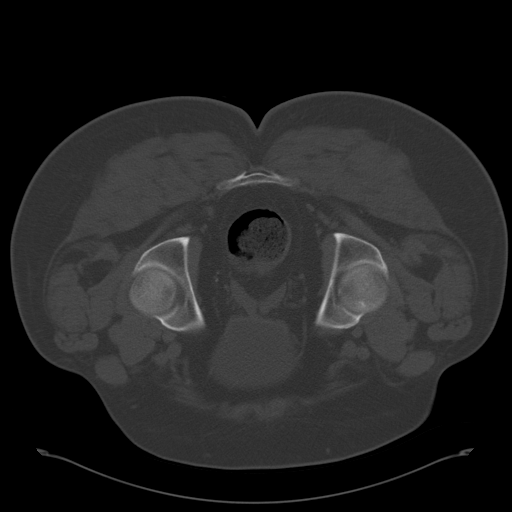
[im 6/26  bone]
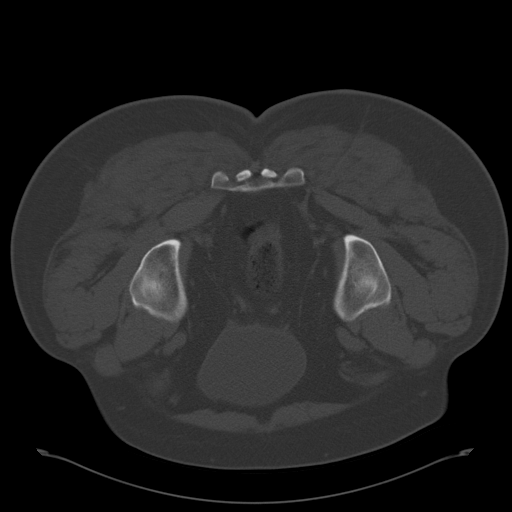
[im 9/26  bone]
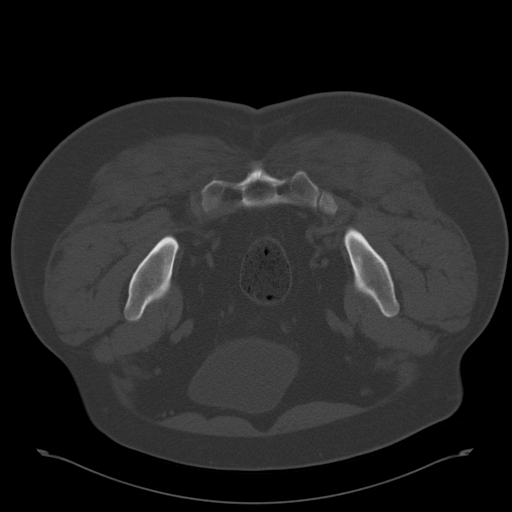
[im 12/26  bone]
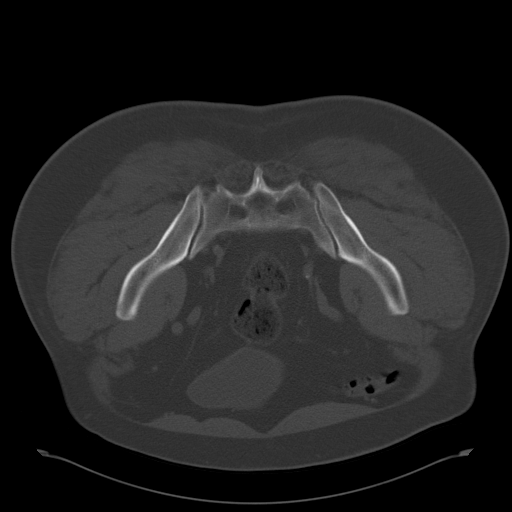
[im 15/26  bone]
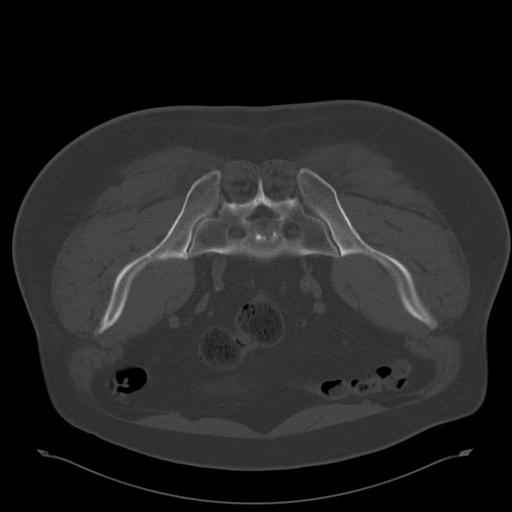
[im 18/26  bone]
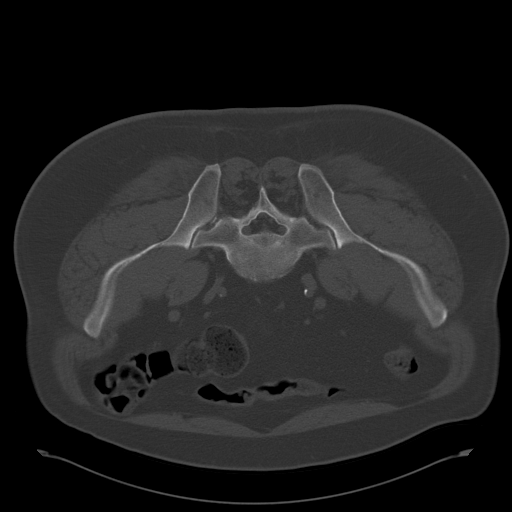
[im 21/26  bone]
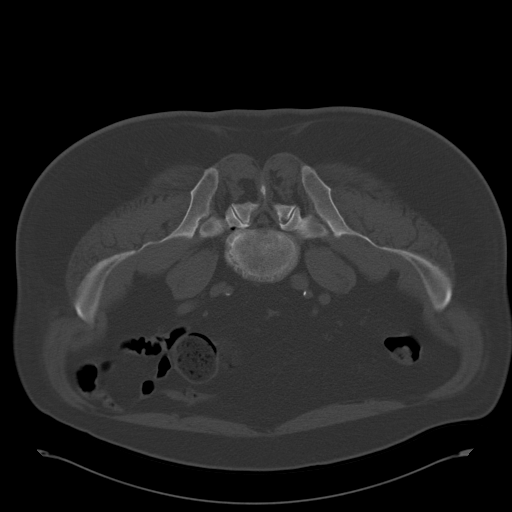
[im 24/26  bone]
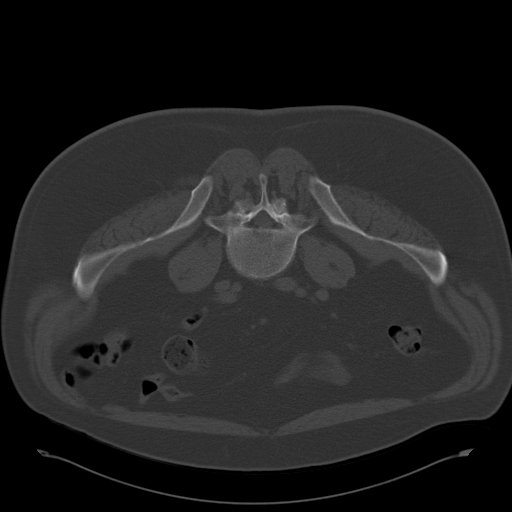

[Series 4: (hospital) 6.0 b30f · axial · 1.71mm/px · z∈[-160,-157]mm · 6 of 12 slices shown, 11 images]
[im 2/12  soft-tissue]
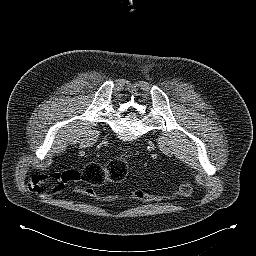
[im 2/12  lung]
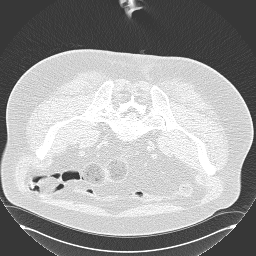
[im 2/12  bone]
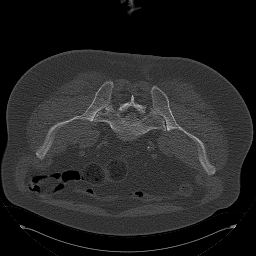
[im 4/12  soft-tissue]
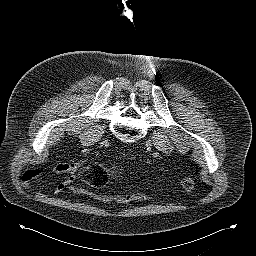
[im 4/12  lung]
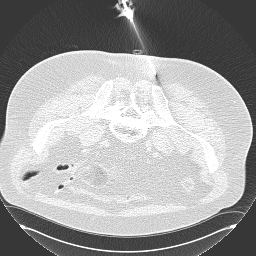
[im 5/12  soft-tissue]
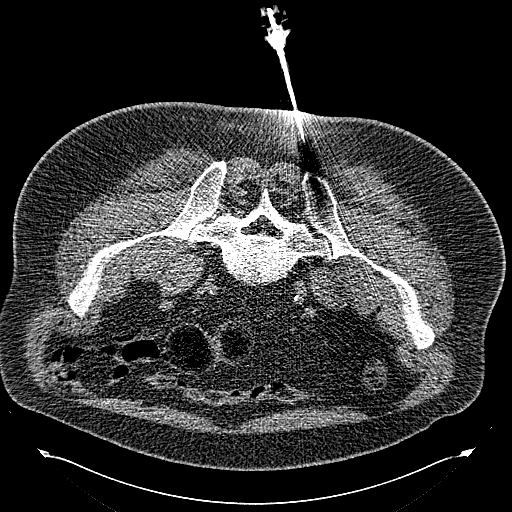
[im 5/12  lung]
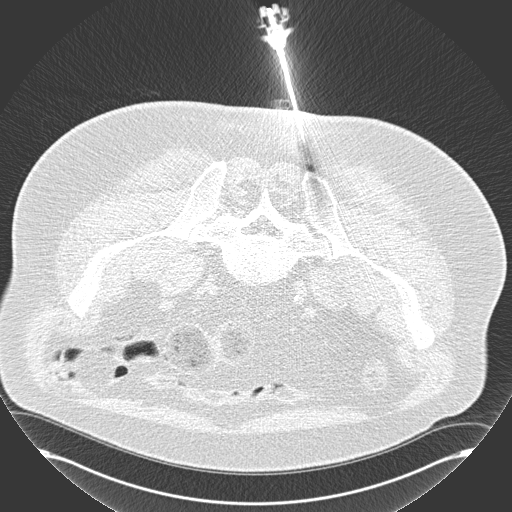
[im 7/12  soft-tissue]
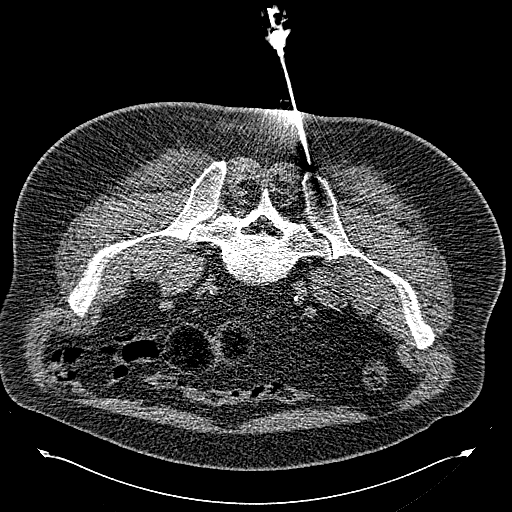
[im 8/12  soft-tissue]
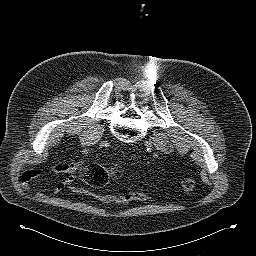
[im 10/12  soft-tissue]
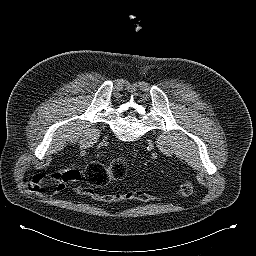
[im 10/12  lung]
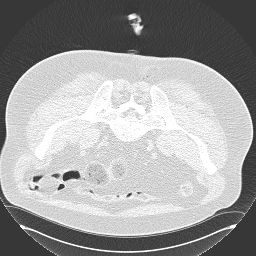

[Series 5: bone (person_name) · axial · 0.86mm/px · z∈[-167,-147]mm · 4 of 8 slices shown (2 of 2)]
[im 2/8  bone]
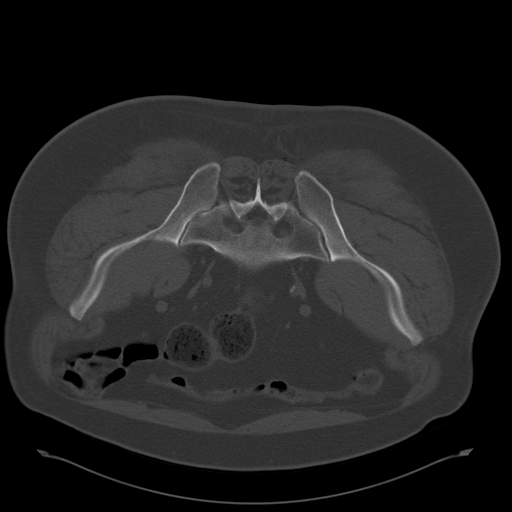
[im 3/8  bone]
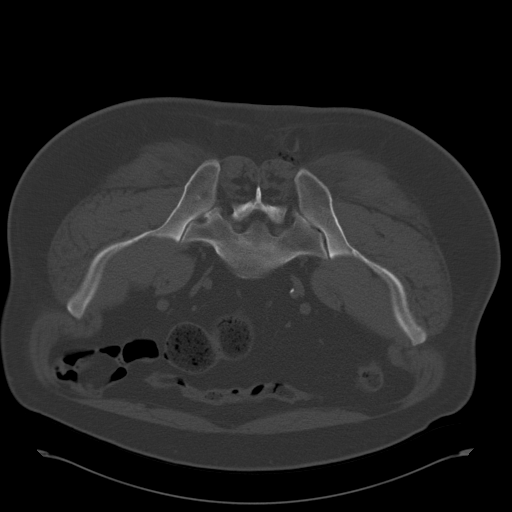
[im 5/8  bone]
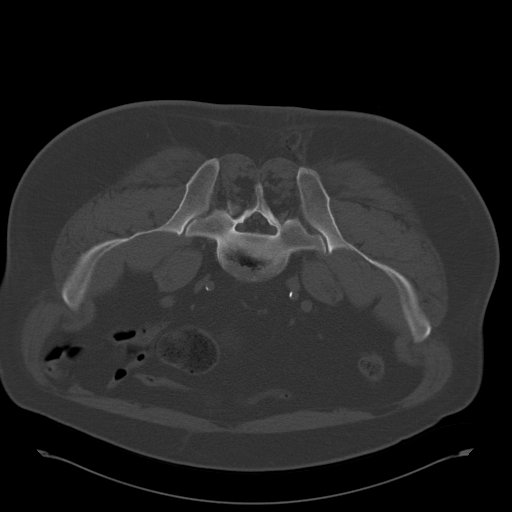
[im 6/8  bone]
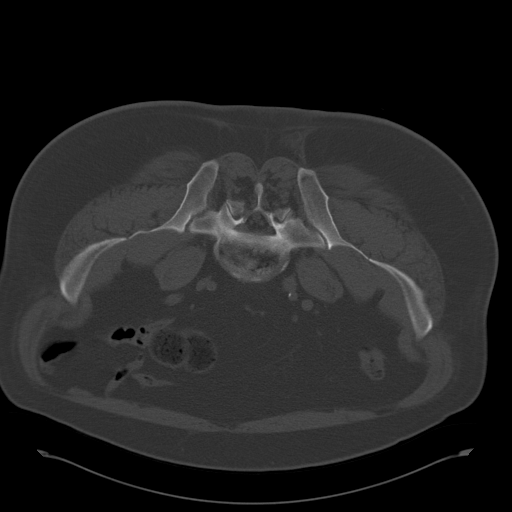

[18 of 32 positions shown; findings below may reference images not displayed]

Intravenous Fentanyl and Versed were administered as conscious
sedation during continuous cardiorespiratory monitoring by the
radiology RN, with a total moderate sedation time of 10 minutes.

Under CT fluoroscopic guidance an 11-gauge Cook trocar bone needle
was advanced into the  iliac bone just lateral to the sacroiliac
joint. Once needle tip position was confirmed, coaxial core and
aspiration samples were obtained. The final sample was obtained
using the guiding needle itself, which was then removed.
Postprocedure scans show no hematoma or fracture. Patient tolerated
procedure well, with no immediate complication.
IMPRESSION: 1. Technically successful CT guided  iliac bone core and aspiration
biopsy.

## 2013-06-26 ENCOUNTER — Other Ambulatory Visit: Payer: Self-pay | Admitting: *Deleted

## 2013-06-26 ENCOUNTER — Other Ambulatory Visit: Payer: Medicare Other

## 2013-06-26 ENCOUNTER — Ambulatory Visit: Payer: Medicare Other | Admitting: Internal Medicine

## 2013-06-27 ENCOUNTER — Telehealth: Payer: Self-pay | Admitting: Internal Medicine

## 2013-06-27 NOTE — Telephone Encounter (Signed)
lvm for pt regarding to March appt....mailed pt appt sched/avs and letter °

## 2013-07-15 ENCOUNTER — Other Ambulatory Visit: Payer: Medicare Other

## 2013-07-17 ENCOUNTER — Telehealth: Payer: Self-pay | Admitting: Internal Medicine

## 2013-07-17 NOTE — Telephone Encounter (Signed)
, °

## 2013-07-18 ENCOUNTER — Other Ambulatory Visit (HOSPITAL_BASED_OUTPATIENT_CLINIC_OR_DEPARTMENT_OTHER): Payer: Medicare Other

## 2013-07-18 ENCOUNTER — Other Ambulatory Visit: Payer: Self-pay | Admitting: Medical Oncology

## 2013-07-18 ENCOUNTER — Telehealth: Payer: Self-pay | Admitting: Medical Oncology

## 2013-07-18 DIAGNOSIS — D472 Monoclonal gammopathy: Secondary | ICD-10-CM

## 2013-07-18 LAB — COMPREHENSIVE METABOLIC PANEL (CC13)
ALT: 12 U/L (ref 0–55)
ANION GAP: 12 meq/L — AB (ref 3–11)
AST: 14 U/L (ref 5–34)
Albumin: 3.8 g/dL (ref 3.5–5.0)
Alkaline Phosphatase: 82 U/L (ref 40–150)
BILIRUBIN TOTAL: 0.59 mg/dL (ref 0.20–1.20)
BUN: 35.9 mg/dL — AB (ref 7.0–26.0)
CALCIUM: 10.1 mg/dL (ref 8.4–10.4)
CHLORIDE: 105 meq/L (ref 98–109)
CO2: 20 meq/L — AB (ref 22–29)
CREATININE: 2 mg/dL — AB (ref 0.7–1.3)
Glucose: 169 mg/dl — ABNORMAL HIGH (ref 70–140)
POTASSIUM: 3.4 meq/L — AB (ref 3.5–5.1)
SODIUM: 138 meq/L (ref 136–145)
Total Protein: 7.8 g/dL (ref 6.4–8.3)

## 2013-07-18 LAB — CBC WITH DIFFERENTIAL/PLATELET
BASO%: 0.5 % (ref 0.0–2.0)
Basophils Absolute: 0 10*3/uL (ref 0.0–0.1)
EOS ABS: 0.3 10*3/uL (ref 0.0–0.5)
EOS%: 3.8 % (ref 0.0–7.0)
HCT: 44.5 % (ref 38.4–49.9)
HGB: 14.9 g/dL (ref 13.0–17.1)
LYMPH%: 33 % (ref 14.0–49.0)
MCH: 30.8 pg (ref 27.2–33.4)
MCHC: 33.6 g/dL (ref 32.0–36.0)
MCV: 91.7 fL (ref 79.3–98.0)
MONO#: 0.8 10*3/uL (ref 0.1–0.9)
MONO%: 9.4 % (ref 0.0–14.0)
NEUT%: 53.3 % (ref 39.0–75.0)
NEUTROS ABS: 4.6 10*3/uL (ref 1.5–6.5)
Platelets: 145 10*3/uL (ref 140–400)
RBC: 4.85 10*6/uL (ref 4.20–5.82)
RDW: 15.5 % — AB (ref 11.0–14.6)
WBC: 8.6 10*3/uL (ref 4.0–10.3)
lymph#: 2.9 10*3/uL (ref 0.9–3.3)

## 2013-07-18 LAB — LACTATE DEHYDROGENASE (CC13): LDH: 173 U/L (ref 125–245)

## 2013-07-18 NOTE — Telephone Encounter (Signed)
Pt did nto show up for labs - I called and did not get an answer.

## 2013-07-22 ENCOUNTER — Encounter: Payer: Self-pay | Admitting: Internal Medicine

## 2013-07-22 ENCOUNTER — Ambulatory Visit (HOSPITAL_BASED_OUTPATIENT_CLINIC_OR_DEPARTMENT_OTHER): Payer: Medicare Other | Admitting: Internal Medicine

## 2013-07-22 VITALS — BP 153/86 | HR 93 | Temp 97.7°F | Resp 20 | Ht 70.0 in | Wt 282.1 lb

## 2013-07-22 DIAGNOSIS — D472 Monoclonal gammopathy: Secondary | ICD-10-CM

## 2013-07-22 LAB — KAPPA/LAMBDA LIGHT CHAINS
KAPPA FREE LGHT CHN: 29 mg/dL — AB (ref 0.33–1.94)
Kappa:Lambda Ratio: 8.81 — ABNORMAL HIGH (ref 0.26–1.65)
LAMBDA FREE LGHT CHN: 3.29 mg/dL — AB (ref 0.57–2.63)

## 2013-07-22 LAB — IGG, IGA, IGM
IGA: 200 mg/dL (ref 68–379)
IgG (Immunoglobin G), Serum: 1420 mg/dL (ref 650–1600)
IgM, Serum: 81 mg/dL (ref 41–251)

## 2013-07-22 LAB — BETA 2 MICROGLOBULIN, SERUM: BETA 2 MICROGLOBULIN: 5.66 mg/L — AB (ref <=2.51)

## 2013-07-22 NOTE — Progress Notes (Signed)
[place] [phone]) [phone]   Fax:(336) [phone]  OFFICE PROGRESS NOTE  [address] [place] [address]  DIAGNOSIS: Monoclonal gammopathy of undetermined significance (MGUS).   PRIOR THERAPY: None   CURRENT THERAPY: Observation.  INTERVAL HISTORY: [name] 68 y.o. male returns to the clinic today for followup visit. The patient is feeling fine today with no specific complaints except for increasing fatigue and shortness breath with exertion after he gained 10 more pounds since his last visit. He denied having any significant night sweats. He denied having any palpable lymphadenopathy. He has no nausea or vomiting. He denied having any significant chest pain, shortness breath, cough or hemoptysis. He had repeat myeloma panel performed recently and he is here for evaluation and discussion of his lab results.  MEDICAL HISTORY: Past Medical History  Diagnosis Date  . Hypertension   . Obesity   . High cholesterol   . Sleep apnea   . Degenerative joint disease   . Kidney stones     ALLERGIES:  has No Known Allergies.  MEDICATIONS:  Current Outpatient Prescriptions  Medication Sig Dispense Refill  . amLODipine (NORVASC) 5 MG tablet Take 5 mg by mouth daily.      . hydrochlorothiazide (HYDRODIURIL) 25 MG tablet Take 25 mg by mouth daily.      Marland Kitchen lisinopril (PRINIVIL,ZESTRIL) 10 MG tablet Take 10 mg by mouth daily.      . metoprolol succinate (TOPROL-XL) 50 MG 24 hr tablet Take 50 mg by mouth daily.      . rosuvastatin (CRESTOR) 10 MG tablet Take 10 mg by mouth daily.       No current facility-administered medications for this visit.    REVIEW OF SYSTEMS:  A comprehensive review of systems was negative.   PHYSICAL EXAMINATION: General appearance: alert, cooperative and no distress Head: Normocephalic, without obvious abnormality, atraumatic Neck: no adenopathy Lymph nodes: Cervical, supraclavicular, and axillary nodes  normal. Resp: clear to auscultation bilaterally Cardio: regular rate and rhythm, S1, S2 normal, no murmur, click, rub or gallop GI: soft, non-tender; bowel sounds normal; no masses,  no organomegaly Extremities: extremities normal, atraumatic, no cyanosis or edema  ECOG PERFORMANCE STATUS: 1 - Symptomatic but completely ambulatory  Blood pressure 153/86, pulse 93, temperature 97.7 F (36.5 C), resp. rate 20, height 5\' 10"  (1.778 m), weight 282 lb 1.6 oz (127.96 kg), SpO2 97.00%.  LABORATORY DATA: Lab Results  Component Value Date   WBC 8.6 07/18/2013   HGB 14.9 07/18/2013   HCT 44.5 07/18/2013   MCV 91.7 07/18/2013   PLT 145 07/18/2013      Chemistry      Component Value Date/Time   NA 138 07/18/2013 0935   NA 138 12/16/2011 1000   K 3.4* 07/18/2013 0935   K 3.8 12/16/2011 1000   CL 104 06/22/2012 0901   CL 104 12/16/2011 1000   CO2 20* 07/18/2013 0935   CO2 25 12/16/2011 1000   BUN 35.9* 07/18/2013 0935   BUN 25* 12/16/2011 1000   CREATININE 2.0* 07/18/2013 0935   CREATININE 1.88* 12/16/2011 1000      Component Value Date/Time   CALCIUM 10.1 07/18/2013 0935   CALCIUM 9.4 12/16/2011 1000   ALKPHOS 82 07/18/2013 0935   ALKPHOS 75 12/16/2011 1000   AST 14 07/18/2013 0935   AST 15 12/16/2011 1000   ALT 12 07/18/2013 0935   ALT 13 12/16/2011 1000   BILITOT 0.59 07/18/2013 0935   BILITOT  0.6 12/16/2011 1000     Other lab results: Free kappa light chain 29.00, free lambda light chains 3.29 with a kappa/lambda ratio of 8.81. IgG 1420, IgA 200 and IgM 81.  RADIOGRAPHIC STUDIES: No results found.  ASSESSMENT AND PLAN: This is a very pleasant 68 years old white male with history of monoclonal neuropathy of undetermined significance currently on observation. The patient has no evidence for disease progression based on the recent myeloma panel. I discussed the lab result with the patient and recommended for him to continue on observation with repeat myeloma panel in 6 months. He was advised to call  immediately if he has any concerning symptoms in the interval.  The patient voices understanding of current disease status and treatment options and is in agreement with the current care plan.  All questions were answered. The patient knows to call the clinic with any problems, questions or concerns. We can certainly see the patient much sooner if necessary.  Disclaimer: This note was dictated with voice recognition software. Similar sounding words can inadvertently be transcribed and may not be corrected upon review.

## 2013-07-23 ENCOUNTER — Telehealth: Payer: Self-pay | Admitting: Internal Medicine

## 2013-07-23 NOTE — Telephone Encounter (Signed)
s.w. pt wife and advised on SEpt appt...ok and aware

## 2014-01-13 ENCOUNTER — Telehealth: Payer: Self-pay | Admitting: Internal Medicine

## 2014-01-13 NOTE — Telephone Encounter (Signed)
returned pt call no vm no answer..... °

## 2014-01-15 ENCOUNTER — Other Ambulatory Visit: Payer: Medicare Other

## 2014-01-22 ENCOUNTER — Ambulatory Visit: Payer: Medicare Other | Admitting: Internal Medicine

## 2014-02-17 ENCOUNTER — Other Ambulatory Visit: Payer: Medicare Other

## 2014-02-24 ENCOUNTER — Ambulatory Visit: Payer: Medicare Other | Admitting: Internal Medicine

## 2015-07-06 ENCOUNTER — Encounter: Payer: Self-pay | Admitting: Gastroenterology

## 2020-05-01 ENCOUNTER — Observation Stay (HOSPITAL_COMMUNITY)
Admission: EM | Admit: 2020-05-01 | Discharge: 2020-05-03 | Disposition: A | Discharge status: Home / Self Care | Payer: Medicare HMO | Attending: Internal Medicine | Admitting: Internal Medicine

## 2020-05-01 ENCOUNTER — Emergency Department (HOSPITAL_COMMUNITY): Payer: Medicare HMO

## 2020-05-01 ENCOUNTER — Encounter (HOSPITAL_COMMUNITY): Payer: Self-pay

## 2020-05-01 ENCOUNTER — Other Ambulatory Visit: Payer: Self-pay

## 2020-05-01 DIAGNOSIS — M1990 Unspecified osteoarthritis, unspecified site: Secondary | ICD-10-CM | POA: Diagnosis present

## 2020-05-01 DIAGNOSIS — G4730 Sleep apnea, unspecified: Secondary | ICD-10-CM | POA: Diagnosis present

## 2020-05-01 DIAGNOSIS — N1832 Chronic kidney disease, stage 3b: Secondary | ICD-10-CM | POA: Insufficient documentation

## 2020-05-01 DIAGNOSIS — R2681 Unsteadiness on feet: Secondary | ICD-10-CM | POA: Diagnosis not present

## 2020-05-01 DIAGNOSIS — E872 Acidosis: Secondary | ICD-10-CM | POA: Diagnosis not present

## 2020-05-01 DIAGNOSIS — I129 Hypertensive chronic kidney disease with stage 1 through stage 4 chronic kidney disease, or unspecified chronic kidney disease: Secondary | ICD-10-CM | POA: Insufficient documentation

## 2020-05-01 DIAGNOSIS — N179 Acute kidney failure, unspecified: Secondary | ICD-10-CM | POA: Diagnosis not present

## 2020-05-01 DIAGNOSIS — E871 Hypo-osmolality and hyponatremia: Secondary | ICD-10-CM | POA: Diagnosis not present

## 2020-05-01 DIAGNOSIS — Z87891 Personal history of nicotine dependence: Secondary | ICD-10-CM | POA: Diagnosis not present

## 2020-05-01 DIAGNOSIS — E7800 Pure hypercholesterolemia, unspecified: Secondary | ICD-10-CM | POA: Diagnosis present

## 2020-05-01 DIAGNOSIS — U071 COVID-19: Secondary | ICD-10-CM | POA: Diagnosis not present

## 2020-05-01 DIAGNOSIS — I10 Essential (primary) hypertension: Secondary | ICD-10-CM | POA: Diagnosis not present

## 2020-05-01 DIAGNOSIS — D472 Monoclonal gammopathy: Secondary | ICD-10-CM | POA: Diagnosis present

## 2020-05-01 DIAGNOSIS — Z79899 Other long term (current) drug therapy: Secondary | ICD-10-CM | POA: Diagnosis not present

## 2020-05-01 DIAGNOSIS — R059 Cough, unspecified: Secondary | ICD-10-CM | POA: Diagnosis present

## 2020-05-01 DIAGNOSIS — N184 Chronic kidney disease, stage 4 (severe): Secondary | ICD-10-CM | POA: Diagnosis present

## 2020-05-01 DIAGNOSIS — J1282 Pneumonia due to coronavirus disease 2019: Secondary | ICD-10-CM | POA: Diagnosis not present

## 2020-05-01 DIAGNOSIS — N189 Chronic kidney disease, unspecified: Secondary | ICD-10-CM

## 2020-05-01 DIAGNOSIS — E669 Obesity, unspecified: Secondary | ICD-10-CM | POA: Diagnosis present

## 2020-05-01 LAB — CBC WITH DIFFERENTIAL/PLATELET
Abs Immature Granulocytes: 0.04 10*3/uL (ref 0.00–0.07)
Basophils Absolute: 0 10*3/uL (ref 0.0–0.1)
Basophils Relative: 0 %
Eosinophils Absolute: 0 10*3/uL (ref 0.0–0.5)
Eosinophils Relative: 0 %
HCT: 44.7 % (ref 39.0–52.0)
Hemoglobin: 15 g/dL (ref 13.0–17.0)
Immature Granulocytes: 1 %
Lymphocytes Relative: 11 %
Lymphs Abs: 0.8 10*3/uL (ref 0.7–4.0)
MCH: 30.2 pg (ref 26.0–34.0)
MCHC: 33.6 g/dL (ref 30.0–36.0)
MCV: 89.9 fL (ref 80.0–100.0)
Monocytes Absolute: 0.4 10*3/uL (ref 0.1–1.0)
Monocytes Relative: 6 %
Neutro Abs: 5.7 10*3/uL (ref 1.7–7.7)
Neutrophils Relative %: 82 %
Platelets: 86 10*3/uL — ABNORMAL LOW (ref 150–400)
RBC: 4.97 MIL/uL (ref 4.22–5.81)
RDW: 13.3 % (ref 11.5–15.5)
WBC: 6.9 10*3/uL (ref 4.0–10.5)
nRBC: 0 % (ref 0.0–0.2)

## 2020-05-01 LAB — PROCALCITONIN: Procalcitonin: 1.2 ng/mL

## 2020-05-01 LAB — COMPREHENSIVE METABOLIC PANEL
ALT: 25 U/L (ref 0–44)
AST: 49 U/L — ABNORMAL HIGH (ref 15–41)
Albumin: 3 g/dL — ABNORMAL LOW (ref 3.5–5.0)
Alkaline Phosphatase: 60 U/L (ref 38–126)
Anion gap: 13 (ref 5–15)
BUN: 71 mg/dL — ABNORMAL HIGH (ref 8–23)
CO2: 18 mmol/L — ABNORMAL LOW (ref 22–32)
Calcium: 9 mg/dL (ref 8.9–10.3)
Chloride: 97 mmol/L — ABNORMAL LOW (ref 98–111)
Creatinine, Ser: 3.49 mg/dL — ABNORMAL HIGH (ref 0.61–1.24)
GFR, Estimated: 18 mL/min — ABNORMAL LOW (ref >60)
Glucose, Bld: 113 mg/dL — ABNORMAL HIGH (ref 70–99)
Potassium: 3.4 mmol/L — ABNORMAL LOW (ref 3.5–5.1)
Sodium: 128 mmol/L — ABNORMAL LOW (ref 135–145)
Total Bilirubin: 0.7 mg/dL (ref 0.3–1.2)
Total Protein: 7.3 g/dL (ref 6.5–8.1)

## 2020-05-01 LAB — RESP PANEL BY RT-PCR (FLU A&B, COVID) ARPGX2
Influenza A by PCR: NEGATIVE
Influenza B by PCR: NEGATIVE
SARS Coronavirus 2 by RT PCR: POSITIVE — AB

## 2020-05-01 LAB — MAGNESIUM: Magnesium: 1.9 mg/dL (ref 1.7–2.4)

## 2020-05-01 LAB — D-DIMER, QUANTITATIVE: D-Dimer, Quant: 2.04 ug/mL-FEU — ABNORMAL HIGH (ref 0.00–0.50)

## 2020-05-01 LAB — C-REACTIVE PROTEIN: CRP: 11.9 mg/dL — ABNORMAL HIGH (ref <1.0)

## 2020-05-01 MED ORDER — SERTRALINE HCL 50 MG PO TABS
25.0000 mg | ORAL_TABLET | Freq: Every day | ORAL | Status: DC
  Administered 2020-05-01 – 2020-05-03 (×3): 25 mg via ORAL
  Filled 2020-05-01 (×3): qty 1

## 2020-05-01 MED ORDER — ONDANSETRON HCL 4 MG PO TABS: 4.0000 mg | ORAL_TABLET | Freq: Four times a day (QID) | ORAL | Status: DC | PRN

## 2020-05-01 MED ORDER — ONDANSETRON HCL 4 MG/2ML IJ SOLN: 4.0000 mg | Freq: Four times a day (QID) | INTRAMUSCULAR | Status: DC | PRN

## 2020-05-01 MED ORDER — ALBUTEROL SULFATE HFA 108 (90 BASE) MCG/ACT IN AERS
2.0000 | INHALATION_SPRAY | Freq: Once | RESPIRATORY_TRACT | Status: AC
  Administered 2020-05-01: 2 via RESPIRATORY_TRACT
  Filled 2020-05-01: qty 6.7

## 2020-05-01 MED ORDER — SODIUM CHLORIDE 0.9 % IV SOLN
100.0000 mg | Freq: Every day | INTRAVENOUS | Status: DC
  Administered 2020-05-02 – 2020-05-03 (×2): 100 mg via INTRAVENOUS
  Filled 2020-05-01 (×2): qty 20

## 2020-05-01 MED ORDER — ZINC SULFATE 220 (50 ZN) MG PO CAPS
220.0000 mg | ORAL_CAPSULE | Freq: Every day | ORAL | Status: DC
  Administered 2020-05-01 – 2020-05-03 (×3): 220 mg via ORAL
  Filled 2020-05-01 (×3): qty 1

## 2020-05-01 MED ORDER — PREDNISONE 20 MG PO TABS: 50.0000 mg | ORAL_TABLET | Freq: Every day | ORAL | Status: DC

## 2020-05-01 MED ORDER — ACETAMINOPHEN 325 MG PO TABS: 650.0000 mg | ORAL_TABLET | Freq: Four times a day (QID) | ORAL | Status: DC | PRN

## 2020-05-01 MED ORDER — SODIUM CHLORIDE 0.9 % IV BOLUS (SEPSIS)
1000.0000 mL | Freq: Once | INTRAVENOUS | Status: AC
  Administered 2020-05-01: 1000 mL via INTRAVENOUS

## 2020-05-01 MED ORDER — IPRATROPIUM-ALBUTEROL 20-100 MCG/ACT IN AERS
1.0000 | INHALATION_SPRAY | Freq: Two times a day (BID) | RESPIRATORY_TRACT | Status: DC
  Administered 2020-05-01 – 2020-05-03 (×3): 1 via RESPIRATORY_TRACT
  Filled 2020-05-01: qty 4

## 2020-05-01 MED ORDER — SODIUM CHLORIDE 0.9 % IV SOLN
100.0000 mg | INTRAVENOUS | Status: AC
  Administered 2020-05-01 (×2): 100 mg via INTRAVENOUS
  Filled 2020-05-01 (×2): qty 20

## 2020-05-01 MED ORDER — METOPROLOL SUCCINATE ER 50 MG PO TB24
50.0000 mg | ORAL_TABLET | Freq: Every day | ORAL | Status: DC
  Administered 2020-05-02 – 2020-05-03 (×2): 50 mg via ORAL
  Filled 2020-05-01 (×2): qty 1

## 2020-05-01 MED ORDER — HYDROCOD POLST-CPM POLST ER 10-8 MG/5ML PO SUER: 5.0000 mL | Freq: Two times a day (BID) | ORAL | Status: DC | PRN

## 2020-05-01 MED ORDER — POTASSIUM CHLORIDE IN NACL 20-0.9 MEQ/L-% IV SOLN
INTRAVENOUS | Status: DC
  Filled 2020-05-01: qty 1000

## 2020-05-01 MED ORDER — IPRATROPIUM-ALBUTEROL 20-100 MCG/ACT IN AERS: 1.0000 | INHALATION_SPRAY | Freq: Four times a day (QID) | RESPIRATORY_TRACT | Status: DC

## 2020-05-01 MED ORDER — GUAIFENESIN-DM 100-10 MG/5ML PO SYRP
10.0000 mL | ORAL_SOLUTION | ORAL | Status: DC | PRN
  Administered 2020-05-01: 10 mL via ORAL
  Filled 2020-05-01: qty 10

## 2020-05-01 MED ORDER — METHYLPREDNISOLONE SODIUM SUCC 125 MG IJ SOLR
0.5000 mg/kg | Freq: Two times a day (BID) | INTRAMUSCULAR | Status: DC
  Administered 2020-05-01 – 2020-05-03 (×4): 63.75 mg via INTRAVENOUS
  Filled 2020-05-01 (×4): qty 2

## 2020-05-01 MED ORDER — BISACODYL 5 MG PO TBEC: 5.0000 mg | DELAYED_RELEASE_TABLET | Freq: Every day | ORAL | Status: DC | PRN

## 2020-05-01 MED ORDER — ASCORBIC ACID 500 MG PO TABS
500.0000 mg | ORAL_TABLET | Freq: Every day | ORAL | Status: DC
  Administered 2020-05-01 – 2020-05-03 (×3): 500 mg via ORAL
  Filled 2020-05-01 (×3): qty 1

## 2020-05-01 MED ORDER — PREDNISONE 50 MG PO TABS
60.0000 mg | ORAL_TABLET | Freq: Once | ORAL | Status: AC
  Administered 2020-05-01: 60 mg via ORAL
  Filled 2020-05-01: qty 1

## 2020-05-01 MED ORDER — ROSUVASTATIN CALCIUM 10 MG PO TABS
10.0000 mg | ORAL_TABLET | Freq: Every day | ORAL | Status: DC
  Administered 2020-05-01 – 2020-05-03 (×3): 10 mg via ORAL
  Filled 2020-05-01 (×3): qty 1

## 2020-05-01 MED ORDER — PANTOPRAZOLE SODIUM 40 MG PO TBEC
40.0000 mg | DELAYED_RELEASE_TABLET | Freq: Every day | ORAL | Status: DC
  Administered 2020-05-01 – 2020-05-03 (×3): 40 mg via ORAL
  Filled 2020-05-01 (×3): qty 1

## 2020-05-01 NOTE — ED Triage Notes (Signed)
Pt has had covid exposure per son, pt c/o cough, weakness, loss of appetite that has gotten worse over the past few days. Pt is very hard of hearing.

## 2020-05-01 NOTE — ED Notes (Signed)
Pt. Sitting in chair at bedside eating breakfast.

## 2020-05-01 NOTE — Care Management Obs Status (Signed)
[facility] NOTIFICATION   Patient Details  Name: Verne Lanuza MRN: [id number] Date of Birth: 11-Jun-1945   Medicare Observation Status Notification Given:  Yes (mailed to [address] [place], [place] [address])    Tommy Medal 05/01/2020, 2:12 PM

## 2020-05-01 NOTE — ED Notes (Signed)
Date and time results received: 05/01/20 0556   Test: COVID Critical Value: Positive  Name of Provider Notified: [facility]  Orders Received? Or Actions Taken?: NA

## 2020-05-01 NOTE — ED Provider Notes (Signed)
[facility] EMERGENCY DEPARTMENT Provider Note   CSN: [id number] Arrival date & time: 05/01/20  0315     History Chief Complaint  Patient presents with  . Cough    Covid exposure per son    Dave Brown is a 75 y.o. male.  The history is provided by the patient.  Cough Severity:  Moderate Onset quality:  Gradual Duration:  14 days Timing:  Intermittent Progression:  Worsening Chronicity:  New Relieved by:  Nothing Worsened by:  Nothing Associated symptoms: chills, myalgias and shortness of breath   Patient with history of hypertension, hypercholesterolemia presents with cough, fatigue, loss of appetite.  He reports this has been ongoing since Christmas day He reports increasing cough and shortness of breath.  He also reports vomiting. Patient is unvaccinated for COVID-19.  He reports his wife passed away yesterday from Covid.    Past Medical History:  Diagnosis Date  . Degenerative joint disease   . High cholesterol   . Hypertension   . Kidney stones   . Obesity   . Sleep apnea     Patient Active Problem List   Diagnosis Date Noted  . 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    History reviewed. No pertinent surgical history.     No family history on file.  Social History   Tobacco Use  . Smoking status: Former Smoker    Types: Cigars    Quit date: 05/07/1998    Years since quitting: 22.0  Substance Use Topics  . Alcohol use: No  . Drug use: No    Home Medications Prior to Admission medications   Medication Sig Start Date End Date Taking? Authorizing Provider  amLODipine (NORVASC) 5 MG tablet Take 5 mg by mouth daily.    [provider]  hydrochlorothiazide (HYDRODIURIL) 25 MG tablet Take 25 mg by mouth daily.    [provider]  lisinopril (PRINIVIL,ZESTRIL) 10 MG tablet Take 10 mg by mouth daily.    [provider]  metoprolol succinate (TOPROL-XL) 50 MG 24 hr tablet Take 50 mg by mouth daily.  05/27/12   [provider]  rosuvastatin (CRESTOR) 10 MG tablet Take 10 mg by mouth daily.    [provider]    Allergies    Patient has no known allergies.  Review of Systems   Review of Systems  Constitutional: Positive for chills.  Respiratory: Positive for cough and shortness of breath.   Gastrointestinal: Positive for vomiting.  Musculoskeletal: Positive for myalgias.  All other systems reviewed and are negative.   Physical Exam Updated Vital Signs BP 104/70 (BP Location: Left Arm)   Pulse 94   Temp 98.4 F (36.9 C) (Oral)   Resp 14   Ht 1.778 m (5\' 10" )   Wt 128 kg   SpO2 94%   BMI 40.49 kg/m   Physical Exam CONSTITUTIONAL: elderly, ill appearing HEAD: Normocephalic/atraumatic EYES: EOMI/PERRL ENMT: Mask in place NECK: supple no meningeal signs SPINE/BACK:entire spine nontender CV: S1/S2 noted, no murmurs/rubs/gallops noted LUNGS: tachypneic, wheezing bilaterally ABDOMEN: soft, nontender, no rebound or guarding, bowel sounds noted throughout abdomen GU:no cva tenderness NEURO: Pt is awake/alert/appropriate, moves all extremitiesx4.  No facial droop.   EXTREMITIES: pulses normal/equal, full ROM SKIN: warm, color normal PSYCH: no abnormalities of mood noted, alert and oriented to situation  ED Results / Procedures / Treatments   Labs (all labs ordered are listed, but only abnormal results are displayed) Labs Reviewed  RESP PANEL BY RT-PCR (FLU A&B, COVID)  ARPGX2 - Abnormal; Notable for the following components:      Result Value   SARS Coronavirus 2 by RT PCR POSITIVE (*)    All other components within normal limits  CBC WITH DIFFERENTIAL/PLATELET - Abnormal; Notable for the following components:   Platelets 86 (*)    All other components within normal limits  COMPREHENSIVE METABOLIC PANEL - Abnormal; Notable for the following components:   Sodium 128 (*)    Potassium 3.4 (*)    Chloride 97 (*)    CO2 18 (*)    Glucose, Bld 113 (*)     BUN 71 (*)    Creatinine, Ser 3.49 (*)    Albumin 3.0 (*)    AST 49 (*)    GFR, Estimated 18 (*)    All other components within normal limits    EKG None  Radiology DG Chest Port 1 View  Result Date: 05/01/2020 CLINICAL DATA:  Cough, COVID exposure EXAM: PORTABLE CHEST 1 VIEW COMPARISON:  12/28/2010 FINDINGS: The lungs are symmetrically well inflated. Patchy peripheral pulmonary opacities are seen within the a right mid and lower lung zone, possibly infectious in the acute setting. Bronchitic changes are noted with interstitial thickening best appreciated within the perihilar regions. No pneumothorax or pleural effusion. Cardiac size within normal limits. No acute bone abnormality. IMPRESSION: Patchy pulmonary infiltrates within the a right peripheral mid and lower lung zone, likely infectious or inflammatory in the acute setting. Superimposed changes of probable airway inflammation.  In Electronically Signed   By: Fidela Salisbury MD   On: 05/01/2020 04:51    Procedures Procedures    Medications Ordered in ED Medications  sodium chloride 0.9 % bolus 1,000 mL (1,000 mLs Intravenous New Bag/Given 05/01/20 0636)  remdesivir 100 mg in sodium chloride 0.9 % 100 mL IVPB (100 mg Intravenous New Bag/Given 05/01/20 0644)  remdesivir 100 mg in sodium chloride 0.9 % 100 mL IVPB (has no administration in time range)  albuterol (VENTOLIN HFA) 108 (90 Base) MCG/ACT inhaler 2 puff (2 puffs Inhalation Given 05/01/20 0428)  predniSONE (DELTASONE) tablet 60 mg (60 mg Oral Given 05/01/20 0429)    ED Course  I have reviewed the triage vital signs and the nursing notes.  Pertinent labs & imaging results that were available during my care of the patient were reviewed by me and considered in my medical decision making (see chart for details).    MDM Rules/Calculators/A&P                           6:49 AM Patient presents with cough and weakness that he reports he has had since December 25 Here patient was  wheezing and mildly tachypneic.  However he was not significantly hypoxic, lowest pulse ox reading was 93%.  Patient was found to be positive for COVID-19.  Patient is unvaccinated.  Of note, patient's wife just passed away yesterday in this emergency department from COVID-19.  In addition to COVID-19, patient also found to have acute on chronic renal failure.  Baseline creatinine is around 2.6 from outside records, he is now over 3.  I feel patient is high risk for worsening if he would be discharged. After discussed with patient, he will be admitted for rehydration and monitoring.  Remdesivir has also been ordered. Discussed with Dr. Alcario Drought for admission.  Discussed with his son Marcello Moores via the phone and he was updated as well  Layken Doenges was evaluated in Emergency Department on  05/01/2020 for the symptoms described in the history of present illness. He was evaluated in the context of the global COVID-19 pandemic, which necessitated consideration that the patient might be at risk for infection with the SARS-CoV-2 virus that causes COVID-19. Institutional protocols and algorithms that pertain to the evaluation of patients at risk for COVID-19 are in a state of rapid change based on information released by regulatory bodies including the CDC and federal and state organizations. These policies and algorithms were followed during the patient's care in the ED.  Final Clinical Impression(s) / ED Diagnoses Final diagnoses:  COVID-19  AKI (acute kidney injury) [facility])    Rx / [place] Orders ED Discharge Orders    None       Ripley Fraise, MD 05/01/20 [phone]

## 2020-05-01 NOTE — H&P (Signed)
History and Physical  Fertile EGB:[id number] DOB: 1946-02-01 DOA: 05/01/2020  PCP: Aletha Halim., PA-C  Patient coming from: Home   I have personally briefly reviewed patient's old medical records in [place]  Chief Complaint: cough   HPI: Dave Brown is a 75 y.o. male with medical history significant for stage III CKD, MGUS (followed by Dr. Earlie Server), DJD, OSA, HTN, Hyperlipidemia who was brought to the emergency department from home by son with cough and COVID exposure.  Apparently his wife died of COVID yesterday in this emergency department.  The patient reports that he has had symptoms for about 14 days of cough shortness of breath chills myalgias and malaise.  He reports that he has had loss of appetite.  He reports that he is unvaccinated for COVID-19.  His main complaint is shortness of breath, cough that is mostly nonproductive and vomiting.  He says that he has not been eating and drinking well for the past several days because of symptoms.  He reports headache.  He denies fever.  He is having chills.  ED Course: Patient tested positive for SARS 2 coronavirus emergency department.  His temperature was 98.4, pulse 94, respirations 14, pulse ox 94% on room air.  His sodium was 128, CO 3.4, CO2 18, creatinine 3.49.  Albumin 3.0, CRP 11.9 AST 49 procalcitonin 1.20.  WBC 16.9, Platelet count 86.  His baseline creatinine was most recently noted to be around 2.  Admission was requested for acute renal failure with incidental covid pneumonia in a high risk patient.  CXR with findings of patchy right sided pulmonary infiltrates.   Review of Systems: Review of Systems  Constitutional: Positive for chills, diaphoresis, malaise/fatigue and weight loss. Negative for fever.  HENT: Positive for congestion and sore throat. Negative for ear discharge, ear pain, hearing loss, nosebleeds and tinnitus.   Eyes: Negative.   Respiratory: Positive for cough, sputum production,  shortness of breath and wheezing.   Cardiovascular: Negative.   Gastrointestinal: Negative.   Genitourinary: Negative.   Musculoskeletal: Positive for joint pain and myalgias. Negative for back pain and falls.  Skin: Negative.   Neurological: Positive for dizziness, weakness and headaches. Negative for sensory change, speech change, focal weakness and loss of consciousness.  Endo/Heme/Allergies: Negative.   Psychiatric/Behavioral: Positive for depression. Negative for hallucinations, memory loss and suicidal ideas. The patient is nervous/anxious and has insomnia.   All other systems reviewed and are negative.  Past Medical History:  Diagnosis Date  . Degenerative joint disease   . High cholesterol   . Hypertension   . Kidney stones   . Obesity   . Sleep apnea    History reviewed. No pertinent surgical history.   reports that he quit smoking about 22 years ago. His smoking use included cigars. He does not have any smokeless tobacco history on file. He reports that he does not drink alcohol and does not use drugs.  No Known Allergies  History reviewed. No pertinent family history.  Prior to Admission medications   Medication Sig Start Date End Date Taking? Authorizing Provider  amLODipine (NORVASC) 5 MG tablet Take 5 mg by mouth daily.   Yes [provider]  hydrochlorothiazide (HYDRODIURIL) 25 MG tablet Take 25 mg by mouth daily.   Yes [provider]  lisinopril (PRINIVIL,ZESTRIL) 10 MG tablet Take 10 mg by mouth daily.   Yes [provider]  metoprolol succinate (TOPROL-XL) 50 MG 24 hr tablet Take 50 mg by  mouth daily. 05/27/12  Yes [provider]  rosuvastatin (CRESTOR) 10 MG tablet Take 10 mg by mouth daily.   Yes [provider]  sertraline (ZOLOFT) 25 MG tablet Take 25 mg by mouth daily. 02/20/20  Yes [provider]    Physical Exam: Vitals:   05/01/20 0615 05/01/20 0630 05/01/20 0700 05/01/20 0730  BP: 104/60 115/73  126/74 (!) 112/58  Pulse: 93 94 84 85  Resp: 19 (!) 21 (!) 25 (!) 26  Temp:      TempSrc:      SpO2: 94% 94% 96% 95%  Weight:      Height:       Constitutional: chronically ill appearing male, NAD, calm, comfortable Eyes: PERRL, lids and conjunctivae normal ENMT: Mucous membranes are moist. Posterior pharynx clear of any exudate or lesions.Normal dentition.  Neck: normal, supple, no masses, no thyromegaly Respiratory: bibasilar rales heard, no wheezing, no crackles. No increased work of breathing.   Cardiovascular: normal s1, s2 sounds, no murmurs / rubs / gallops. No extremity edema. 2+ pedal pulses. No carotid bruits.  Abdomen: no tenderness, no masses palpated. No hepatosplenomegaly. Bowel sounds positive.  Musculoskeletal: no clubbing / cyanosis. No joint deformity upper and lower extremities. Good ROM, no contractures. Normal muscle tone.  Skin: no rashes, lesions, ulcers. No induration Neurologic: CN 2-12 grossly intact. Sensation intact, DTR normal. Strength 5/5 in all 4.  Psychiatric:  Alert and oriented x 3. Normal mood.   Labs on Admission: I have personally reviewed following labs and imaging studies  CBC: Recent Labs  Lab 05/01/20 0438  WBC 6.9  NEUTROABS 5.7  HGB 15.0  HCT 44.7  MCV 89.9  PLT 86*   Basic Metabolic Panel: Recent Labs  Lab 05/01/20 0438  NA 128*  K 3.4*  CL 97*  CO2 18*  GLUCOSE 113*  BUN 71*  CREATININE 3.49*  CALCIUM 9.0  MG 1.9   GFR: Estimated Creatinine Clearance: 25 mL/min (A) (by C-G formula based on SCr of 3.49 mg/dL (H)). Liver Function Tests: Recent Labs  Lab 05/01/20 0438  AST 49*  ALT 25  ALKPHOS 60  BILITOT 0.7  PROT 7.3  ALBUMIN 3.0*   No results for input(s): LIPASE, AMYLASE in the last 168 hours. No results for input(s): AMMONIA in the last 168 hours. Coagulation Profile: No results for input(s): INR, PROTIME in the last 168 hours. Cardiac Enzymes: No results for input(s): CKTOTAL, CKMB, CKMBINDEX, TROPONINI  in the last 168 hours. BNP (last 3 results) No results for input(s): PROBNP in the last 8760 hours. HbA1C: No results for input(s): HGBA1C in the last 72 hours. CBG: No results for input(s): GLUCAP in the last 168 hours. Lipid Profile: No results for input(s): CHOL, HDL, LDLCALC, TRIG, CHOLHDL, LDLDIRECT in the last 72 hours. Thyroid Function Tests: No results for input(s): TSH, T4TOTAL, FREET4, T3FREE, THYROIDAB in the last 72 hours. Anemia Panel: No results for input(s): VITAMINB12, FOLATE, FERRITIN, TIBC, IRON, RETICCTPCT in the last 72 hours. Urine analysis: No results found for: COLORURINE, APPEARANCEUR, [place], [place], [place], [place], BILIRUBINUR, KETONESUR, PROTEINUR, [place], NITRITE, LEUKOCYTESUR  Radiological Exams on Admission: DG Chest Port 1 View  Result Date: 05/01/2020 CLINICAL DATA:  Cough, COVID exposure EXAM: PORTABLE CHEST 1 VIEW COMPARISON:  12/28/2010 FINDINGS: The lungs are symmetrically well inflated. Patchy peripheral pulmonary opacities are seen within the a right mid and lower lung zone, possibly infectious in the acute setting. Bronchitic changes are noted with interstitial thickening best appreciated within the perihilar regions.  No pneumothorax or pleural effusion. Cardiac size within normal limits. No acute bone abnormality. IMPRESSION: Patchy pulmonary infiltrates within the a right peripheral mid and lower lung zone, likely infectious or inflammatory in the acute setting. Superimposed changes of probable airway inflammation.  In Electronically Signed   By: Fidela Salisbury MD   On: 05/01/2020 04:51   Assessment/Plan Principal Problem:   Acute renal failure (ARF) (HCC) Active Problems:   MGUS (monoclonal gammopathy of unknown significance)   Pneumonia due to COVID-19 virus   Sleep apnea   CKD (chronic kidney disease)   Hypertension   Degenerative joint disease   High cholesterol   Obesity   AKI on CKD stage 3b  - He has had vomiting and poor oral  intake for past several days.  He is dehydrated and should respond to gentle IV fluid hydration.    Covid Pneumonia - fortunately has currently has no oxygen requirement, but he is high risk for adverse outcomes and has been started on steroids and remdesivir.  Monitor inflammatory markers.  If stable, hopeful that he can discharge home tomorrow and complete remdesivir treatment outpatient.   Essential hypertension - plan to resume home meds when reconciled. I asked pharmacy to expedite home med reconciliation.   Hyperlipidemia - he had been previously on rosuvastatin and would restart after home meds have been confirmed.   DVT prophylaxis: SCDs  Code Status: Full   Family Communication: son   Disposition Plan: home   Consults called: n/a  Admission status: OBV   Sakinah Rosamond MD [facility] How to contact the [facility] Attending or Consulting provider [place] or covering provider during after hours [place], for this patient?  1. Check the care team in [facility] and look for a) attending/consulting TR[facility] provider listed and b) the [facility] team listed 2. Log into [email or web address] and use 's universal password to access. If you do not have the password, please contact the hospital operator. 3. Locate the [facility] provider you are looking for under [facility] and page to a number that you can be directly reached. 4. If you still have difficulty reaching the provider, please page the [facility] (Director on Call) for the Hospitalists listed on amion for assistance.   If 7PM-7AM, please contact night-coverage [email or web address] Password [email or web address]  05/01/2020, 9:50 AM

## 2020-05-01 NOTE — Evaluation (Signed)
Physical Therapy Evaluation Patient Details Name: Dave Brown MRN: [id number] DOB: 29-Dec-1945 Today's Date: 05/01/2020   History of Present Illness  Dave Brown is a 75 y.o. male with medical history significant for stage III CKD, MGUS (followed by Dr. Earlie Server), DJD, OSA, HTN, Hyperlipidemia who was brought to the emergency department from home by son with cough and COVID exposure.  Apparently his wife died of COVID yesterday in this emergency department.  The patient reports that he has had symptoms for about 14 days of cough shortness of breath chills myalgias and malaise.  He reports that he has had loss of appetite.  He reports that he is unvaccinated for COVID-19.  His main complaint is shortness of breath, cough that is mostly nonproductive and vomiting.  He says that he has not been eating and drinking well for the past several days because of symptoms.  He reports headache.  He denies fever.  He is having chills.    Clinical Impression  Patient functioning near baseline for functional mobility and gait, demonstrates good return for getting into/out of bed and ambulating in room without loss of balance.  Plan:  Patient discharged from physical therapy to care of nursing for ambulation daily as tolerated for length of stay.     Follow Up Recommendations No PT follow up    Equipment Recommendations  None recommended by PT    Recommendations for Other Services       Precautions / Restrictions Precautions Precautions: None Restrictions Weight Bearing Restrictions: No      Mobility  Bed Mobility Overal bed mobility: Modified Independent                  Transfers Overall transfer level: Modified independent                  Ambulation/Gait Ambulation/Gait assistance: Modified independent (Device/Increase time) Gait Distance (Feet): 20 Feet Assistive device: None Gait Pattern/deviations: WFL(Within Functional Limits) Gait velocity: decreased   General Gait  Details: slightly labored cadence witout loss of balance or having to lean on objects in room  Stairs            Wheelchair Mobility    Modified Rankin (Stroke Patients Only)       Balance Overall balance assessment: No apparent balance deficits (not formally assessed)                                           Pertinent Vitals/Pain Pain Assessment: Faces Faces Pain Scale: Hurts a little bit Pain Location: low back Pain Descriptors / Indicators: Sore Pain Intervention(s): Limited activity within patient's tolerance;Monitored during session;Repositioned    Home Living Family/patient expects to be discharged to:: Private residence Living Arrangements: Children Available Help at Discharge: Family;Available PRN/intermittently Type of Home: House Home Access: Ramped entrance     Home Layout: One level Home Equipment: Walker - 2 wheels      Prior Function Level of Independence: Independent         Comments: Hydrographic surveyor, drives     Journalist, newspaper        Extremity/Trunk Assessment   Upper Extremity Assessment Upper Extremity Assessment: Overall WFL for tasks assessed    Lower Extremity Assessment Lower Extremity Assessment: Overall WFL for tasks assessed    Cervical / Trunk Assessment Cervical / Trunk Assessment: Normal  Communication   Communication: HOH  Cognition Arousal/Alertness:  Awake/alert Behavior During Therapy: WFL for tasks assessed/performed Overall Cognitive Status: Within Functional Limits for tasks assessed                                        General Comments      Exercises     Assessment/Plan    PT Assessment Patent does not need any further PT services  PT Problem List         PT Treatment Interventions      PT Goals (Current goals can be found in the Care Plan section)  Acute Rehab PT Goals Patient Stated Goal: return home with family to assist PT Goal Formulation: With  patient Time For Goal Achievement: 05/01/20 Potential to Achieve Goals: Good    Frequency     Barriers to discharge        Co-evaluation               AM-PAC PT "6 Clicks" Mobility  Outcome Measure Help needed turning from your back to your side while in a flat bed without using bedrails?: None Help needed moving from lying on your back to sitting on the side of a flat bed without using bedrails?: None Help needed moving to and from a bed to a chair (including a wheelchair)?: None Help needed standing up from a chair using your arms (e.g., wheelchair or bedside chair)?: None Help needed to walk in hospital room?: None Help needed climbing 3-5 steps with a railing? : None 6 Click Score: 24    End of Session   Activity Tolerance: Patient tolerated treatment well Patient left: in chair;with call bell/phone within reach Nurse Communication: Mobility status PT Visit Diagnosis: Unsteadiness on feet (R26.81);Other abnormalities of gait and mobility (R26.89);Muscle weakness (generalized) (M62.81)    Time: 9628-3662 PT Time Calculation (min) (ACUTE ONLY): 20 min   Charges:   PT Evaluation $PT Eval Moderate Complexity: 1 Mod PT Treatments $Therapeutic Activity: 8-22 mins        11:30 AM, 05/01/20 Lonell Grandchild, MPT Physical Therapist with [facility] [phone] office [phone] mobile phone

## 2020-05-02 DIAGNOSIS — U071 COVID-19: Secondary | ICD-10-CM | POA: Diagnosis not present

## 2020-05-02 DIAGNOSIS — N179 Acute kidney failure, unspecified: Secondary | ICD-10-CM

## 2020-05-02 DIAGNOSIS — D472 Monoclonal gammopathy: Secondary | ICD-10-CM | POA: Diagnosis not present

## 2020-05-02 DIAGNOSIS — N1832 Chronic kidney disease, stage 3b: Secondary | ICD-10-CM

## 2020-05-02 DIAGNOSIS — J1282 Pneumonia due to coronavirus disease 2019: Secondary | ICD-10-CM

## 2020-05-02 LAB — CBC WITH DIFFERENTIAL/PLATELET
Abs Immature Granulocytes: 0.03 10*3/uL (ref 0.00–0.07)
Basophils Absolute: 0 10*3/uL (ref 0.0–0.1)
Basophils Relative: 0 %
Eosinophils Absolute: 0 10*3/uL (ref 0.0–0.5)
Eosinophils Relative: 0 %
HCT: 42.6 % (ref 39.0–52.0)
Hemoglobin: 14.5 g/dL (ref 13.0–17.0)
Immature Granulocytes: 0 %
Lymphocytes Relative: 5 %
Lymphs Abs: 0.5 10*3/uL — ABNORMAL LOW (ref 0.7–4.0)
MCH: 30.9 pg (ref 26.0–34.0)
MCHC: 34 g/dL (ref 30.0–36.0)
MCV: 90.8 fL (ref 80.0–100.0)
Monocytes Absolute: 0.3 10*3/uL (ref 0.1–1.0)
Monocytes Relative: 3 %
Neutro Abs: 8.4 10*3/uL — ABNORMAL HIGH (ref 1.7–7.7)
Neutrophils Relative %: 92 %
Platelets: 134 10*3/uL — ABNORMAL LOW (ref 150–400)
RBC: 4.69 MIL/uL (ref 4.22–5.81)
RDW: 13.6 % (ref 11.5–15.5)
WBC: 9.3 10*3/uL (ref 4.0–10.5)
nRBC: 0 % (ref 0.0–0.2)

## 2020-05-02 LAB — PHOSPHORUS: Phosphorus: 3.6 mg/dL (ref 2.5–4.6)

## 2020-05-02 LAB — COMPREHENSIVE METABOLIC PANEL
ALT: 27 U/L (ref 0–44)
AST: 41 U/L (ref 15–41)
Albumin: 2.6 g/dL — ABNORMAL LOW (ref 3.5–5.0)
Alkaline Phosphatase: 58 U/L (ref 38–126)
Anion gap: 12 (ref 5–15)
BUN: 67 mg/dL — ABNORMAL HIGH (ref 8–23)
CO2: 14 mmol/L — ABNORMAL LOW (ref 22–32)
Calcium: 8.8 mg/dL — ABNORMAL LOW (ref 8.9–10.3)
Chloride: 103 mmol/L (ref 98–111)
Creatinine, Ser: 3.06 mg/dL — ABNORMAL HIGH (ref 0.61–1.24)
GFR, Estimated: 21 mL/min — ABNORMAL LOW (ref >60)
Glucose, Bld: 233 mg/dL — ABNORMAL HIGH (ref 70–99)
Potassium: 3.6 mmol/L (ref 3.5–5.1)
Sodium: 129 mmol/L — ABNORMAL LOW (ref 135–145)
Total Bilirubin: 0.4 mg/dL (ref 0.3–1.2)
Total Protein: 6.6 g/dL (ref 6.5–8.1)

## 2020-05-02 LAB — GLUCOSE, CAPILLARY: Glucose-Capillary: 259 mg/dL — ABNORMAL HIGH (ref 70–99)

## 2020-05-02 LAB — MAGNESIUM: Magnesium: 1.9 mg/dL (ref 1.7–2.4)

## 2020-05-02 LAB — C-REACTIVE PROTEIN: CRP: 6.1 mg/dL — ABNORMAL HIGH (ref <1.0)

## 2020-05-02 LAB — D-DIMER, QUANTITATIVE: D-Dimer, Quant: 2.47 ug/mL-FEU — ABNORMAL HIGH (ref 0.00–0.50)

## 2020-05-02 LAB — HEMOGLOBIN A1C
Hgb A1c MFr Bld: 6.9 % — ABNORMAL HIGH (ref 4.8–5.6)
Mean Plasma Glucose: 151.33 mg/dL

## 2020-05-02 LAB — FERRITIN: Ferritin: 2750 ng/mL — ABNORMAL HIGH (ref 24–336)

## 2020-05-02 MED ORDER — SODIUM BICARBONATE 8.4 % IV SOLN
INTRAVENOUS | Status: AC
  Filled 2020-05-02: qty 150

## 2020-05-02 MED ORDER — SODIUM BICARBONATE 8.4 % IV SOLN
INTRAVENOUS | Status: DC
  Filled 2020-05-02 (×5): qty 850

## 2020-05-02 MED ORDER — INSULIN ASPART 100 UNIT/ML ~~LOC~~ SOLN
0.0000 [IU] | Freq: Three times a day (TID) | SUBCUTANEOUS | Status: DC
  Administered 2020-05-02: 5 [IU] via SUBCUTANEOUS
  Administered 2020-05-03: 3 [IU] via SUBCUTANEOUS
  Administered 2020-05-03: 8 [IU] via SUBCUTANEOUS

## 2020-05-02 MED ORDER — INSULIN ASPART 100 UNIT/ML ~~LOC~~ SOLN
0.0000 [IU] | Freq: Every day | SUBCUTANEOUS | Status: DC
  Administered 2020-05-02: 3 [IU] via SUBCUTANEOUS

## 2020-05-02 MED ORDER — SODIUM BICARBONATE 8.4 % IV SOLN: INTRAVENOUS | Status: DC

## 2020-05-02 NOTE — Progress Notes (Signed)
PROGRESS NOTE    Dave Brown  KWI:[id number] DOB: 1946-02-27 DOA: 05/01/2020 PCP: Aletha Halim., PA-C    Brief Narrative:  75 year old male admitted to the hospital with dehydration.  He was found to have acute kidney injury on chronic kidney disease stage III.  Patient was having frequent diarrhea secondary to COVID-19.  Imaging in the emergency room indicated possible COVID-19 pneumonia.  He was admitted for IV fluids, remdesivir and steroids.   Assessment & Plan:   Principal Problem:   Acute renal failure (ARF) (HCC) Active Problems:   MGUS (monoclonal gammopathy of unknown significance)   Pneumonia due to COVID-19 virus   Sleep apnea   CKD (chronic kidney disease)   Hypertension   Degenerative joint disease   High cholesterol   Obesity   Acute kidney injury on chronic kidney disease stage IIIb. -Baseline creatinine of 2.5 -Presents with a creatinine of 3.4, secondary to volume depletion -Renal function improving with hydration -Continue IV fluids for another 24 hours  Normal anion gap metabolic acidosis -Suspect is related to diarrhea -Add bicarbonate to IV fluids  Hyponatremia -Secondary to volume loss from GI tract -Continue IV fluids  COVID-19 pneumonia -Started on steroids and remdesivir -Continue pulmonary hygiene   DVT prophylaxis: SCDs Start: 05/01/20 0737  Code Status: Full code Family Communication: Discussed with patient Disposition Plan: Status is: Observation  The patient remains OBS appropriate and will d/c before 2 midnights.  Dispo: The patient is from: Home              Anticipated d/c is to: Home              Anticipated d/c date is: 1 day              Patient currently is not medically stable to d/c.   Consultants:     Procedures:     Antimicrobials:       Subjective: He says he is feeling better.  He was previously having diarrhea, but that is since resolved.  No nausea or vomiting.  Feels that shortness of breath is  improving  Objective: Vitals:   05/01/20 2045 05/01/20 2103 05/02/20 0531 05/02/20 1534  BP:  113/72 128/70 118/76  Pulse:  93 84 76  Resp:  17 19   Temp:  (!) 96.9 F (36.1 C) (!) 97 F (36.1 C) 97.9 F (36.6 C)  TempSrc:    Oral  SpO2: 91% 96% 94% 94%  Weight:      Height:        Intake/Output Summary (Last 24 hours) at 05/02/2020 2031 Last data filed at 05/02/2020 1307 Gross per 24 hour  Intake 746.39 ml  Output 1350 ml  Net -603.61 ml   Filed Weights   05/01/20 0341  Weight: 128 kg    Examination:  General exam: Appears calm and comfortable  Respiratory system: Clear to auscultation. Respiratory effort normal. Cardiovascular system: S1 & S2 heard, RRR. No JVD, murmurs, rubs, gallops or clicks. No pedal edema. Gastrointestinal system: Abdomen is nondistended, soft and nontender. No organomegaly or masses felt. Normal bowel sounds heard. Central nervous system: Alert and oriented. No focal neurological deficits. Extremities: Symmetric 5 x 5 power. Skin: No rashes, lesions or ulcers Psychiatry: Judgement and insight appear normal. Mood & affect appropriate.     Data Reviewed: I have personally reviewed following labs and imaging studies  CBC: Recent Labs  Lab 05/01/20 0438 05/02/20 1437  WBC 6.9 9.3  NEUTROABS 5.7 8.4*  HGB 15.0  14.5  HCT 44.7 42.6  MCV 89.9 90.8  PLT 86* 825*   Basic Metabolic Panel: Recent Labs  Lab 05/01/20 0438 05/02/20 1437  NA 128* 129*  K 3.4* 3.6  CL 97* 103  CO2 18* 14*  GLUCOSE 113* 233*  BUN 71* 67*  CREATININE 3.49* 3.06*  CALCIUM 9.0 8.8*  MG 1.9 1.9  PHOS  --  3.6   GFR: Estimated Creatinine Clearance: 28.5 mL/min (A) (by C-G formula based on SCr of 3.06 mg/dL (H)). Liver Function Tests: Recent Labs  Lab 05/01/20 0438 05/02/20 1437  AST 49* 41  ALT 25 27  ALKPHOS 60 58  BILITOT 0.7 0.4  PROT 7.3 6.6  ALBUMIN 3.0* 2.6*   No results for input(s): LIPASE, AMYLASE in the last 168 hours. No results for  input(s): AMMONIA in the last 168 hours. Coagulation Profile: No results for input(s): INR, PROTIME in the last 168 hours. Cardiac Enzymes: No results for input(s): CKTOTAL, CKMB, CKMBINDEX, TROPONINI in the last 168 hours. BNP (last 3 results) No results for input(s): PROBNP in the last 8760 hours. HbA1C: No results for input(s): HGBA1C in the last 72 hours. CBG: No results for input(s): GLUCAP in the last 168 hours. Lipid Profile: No results for input(s): CHOL, HDL, LDLCALC, TRIG, CHOLHDL, LDLDIRECT in the last 72 hours. Thyroid Function Tests: No results for input(s): TSH, T4TOTAL, FREET4, T3FREE, THYROIDAB in the last 72 hours. Anemia Panel: Recent Labs    05/02/20 1437  FERRITIN 2,750*   Sepsis Labs: Recent Labs  Lab 05/01/20 0438  PROCALCITON 1.20    Recent Results (from the past 240 hour(s))  Resp Panel by RT-PCR (Flu A&B, Covid) Nasopharyngeal Swab     Status: Abnormal   Collection Time: 05/01/20  4:38 AM   Specimen: Nasopharyngeal Swab; Nasopharyngeal(NP) swabs in vial transport medium  Result Value Ref Range Status   SARS Coronavirus 2 by RT PCR POSITIVE (A) NEGATIVE Final    Comment: RESULT CALLED TO, READ BACK BY AND VERIFIED WITH: THOMPSON,R AT 0539 BY HUFFINES,S ON 05/01/20. (NOTE) SARS-CoV-2 target nucleic acids are DETECTED.  The SARS-CoV-2 RNA is generally detectable in upper respiratory specimens during the acute phase of infection. Positive results are indicative of the presence of the identified virus, but do not rule out bacterial infection or co-infection with other pathogens not detected by the test. Clinical correlation with patient history and other diagnostic information is necessary to determine patient infection status. The expected result is Negative.  Fact Sheet for Patients: EntrepreneurPulse.com.au  Fact Sheet for Healthcare Providers: IncredibleEmployment.be  This test is not yet approved or  cleared by the [place] FDA and  has been authorized for detection and/or diagnosis of SARS-CoV-2 by FDA under an Emergency Use Authorization (EUA).  This EUA will remain in effect (meaning this  test can be used) for the duration of  the COVID-19 declaration under Section 564(b)(1) of the Act, 21 U.S.C. section 360bbb-3(b)(1), unless the authorization is terminated or revoked sooner.     Influenza A by PCR NEGATIVE NEGATIVE Final   Influenza B by PCR NEGATIVE NEGATIVE Final    Comment: (NOTE) The Xpert Xpress SARS-CoV-2/FLU/RSV plus assay is intended as an aid in the diagnosis of influenza from Nasopharyngeal swab specimens and should not be used as a sole basis for treatment. Nasal washings and aspirates are unacceptable for Xpert Xpress SARS-CoV-2/FLU/RSV testing.  Fact Sheet for Patients: EntrepreneurPulse.com.au  Fact Sheet for Healthcare Providers: IncredibleEmployment.be  This test is not yet approved or  cleared by the [place] and has been authorized for detection and/or diagnosis of SARS-CoV-2 by FDA under an Emergency Use Authorization (EUA). This EUA will remain in effect (meaning this test can be used) for the duration of the COVID-19 declaration under Section 564(b)(1) of the Act, 21 U.S.C. section 360bbb-3(b)(1), unless the authorization is terminated or revoked.  Performed at [facility], [address] [place], [place] [address]          Radiology Studies: [facility] Chest [facility] 1 View  Result Date: 05/01/2020 CLINICAL DATA:  Cough, COVID exposure EXAM: PORTABLE CHEST 1 VIEW COMPARISON:  12/28/2010 FINDINGS: The lungs are symmetrically well inflated. Patchy peripheral pulmonary opacities are seen within the a right mid and lower lung zone, possibly infectious in the acute setting. Bronchitic changes are noted with interstitial thickening best appreciated within the perihilar regions. No pneumothorax or pleural  effusion. Cardiac size within normal limits. No acute bone abnormality. IMPRESSION: Patchy pulmonary infiltrates within the a right peripheral mid and lower lung zone, likely infectious or inflammatory in the acute setting. Superimposed changes of probable airway inflammation.  In Electronically Signed   By: Fidela Salisbury MD   On: 05/01/2020 04:51        Scheduled Meds: . vitamin C  500 mg Oral Daily  . insulin aspart  0-15 Units Subcutaneous TID WC  . insulin aspart  0-5 Units Subcutaneous QHS  . Ipratropium-Albuterol  1 puff Inhalation BID  . methylPREDNISolone (SOLU-MEDROL) injection  0.5 mg/kg Intravenous Q12H   Followed by  . [START ON 05/04/2020] predniSONE  50 mg Oral Daily  . metoprolol succinate  50 mg Oral Daily  . pantoprazole  40 mg Oral Daily  . rosuvastatin  10 mg Oral Daily  . sertraline  25 mg Oral Daily  . zinc sulfate  220 mg Oral Daily   Continuous Infusions: . remdesivir 100 mg in NS 100 mL 100 mg (05/02/20 1035)  . sodium bicarbonate (isotonic) 150 mEq in D5W 1000 mL infusion 100 mL/hr at 05/02/20 1759     LOS: 0 days    Time spent: 51mins    Kathie Dike, MD [facility]   If 7PM-7AM, please contact night-coverage [email or web address]  05/02/2020, 8:31 PM

## 2020-05-03 ENCOUNTER — Encounter (HOSPITAL_COMMUNITY): Payer: Self-pay | Admitting: Family Medicine

## 2020-05-03 DIAGNOSIS — N1832 Chronic kidney disease, stage 3b: Secondary | ICD-10-CM | POA: Diagnosis not present

## 2020-05-03 DIAGNOSIS — D472 Monoclonal gammopathy: Secondary | ICD-10-CM | POA: Diagnosis not present

## 2020-05-03 DIAGNOSIS — U071 COVID-19: Secondary | ICD-10-CM | POA: Diagnosis not present

## 2020-05-03 DIAGNOSIS — N179 Acute kidney failure, unspecified: Secondary | ICD-10-CM | POA: Diagnosis not present

## 2020-05-03 LAB — CBC WITH DIFFERENTIAL/PLATELET
Abs Immature Granulocytes: 0.04 10*3/uL (ref 0.00–0.07)
Basophils Absolute: 0 10*3/uL (ref 0.0–0.1)
Basophils Relative: 0 %
Eosinophils Absolute: 0 10*3/uL (ref 0.0–0.5)
Eosinophils Relative: 0 %
HCT: 38.9 % — ABNORMAL LOW (ref 39.0–52.0)
Hemoglobin: 13.2 g/dL (ref 13.0–17.0)
Immature Granulocytes: 1 %
Lymphocytes Relative: 4 %
Lymphs Abs: 0.3 10*3/uL — ABNORMAL LOW (ref 0.7–4.0)
MCH: 30.8 pg (ref 26.0–34.0)
MCHC: 33.9 g/dL (ref 30.0–36.0)
MCV: 90.9 fL (ref 80.0–100.0)
Monocytes Absolute: 0.2 10*3/uL (ref 0.1–1.0)
Monocytes Relative: 3 %
Neutro Abs: 7.4 10*3/uL (ref 1.7–7.7)
Neutrophils Relative %: 92 %
Platelets: 109 10*3/uL — ABNORMAL LOW (ref 150–400)
RBC: 4.28 MIL/uL (ref 4.22–5.81)
RDW: 13.7 % (ref 11.5–15.5)
WBC: 8 10*3/uL (ref 4.0–10.5)
nRBC: 0 % (ref 0.0–0.2)

## 2020-05-03 LAB — COMPREHENSIVE METABOLIC PANEL
ALT: 25 U/L (ref 0–44)
AST: 33 U/L (ref 15–41)
Albumin: 2.4 g/dL — ABNORMAL LOW (ref 3.5–5.0)
Alkaline Phosphatase: 49 U/L (ref 38–126)
Anion gap: 10 (ref 5–15)
BUN: 66 mg/dL — ABNORMAL HIGH (ref 8–23)
CO2: 20 mmol/L — ABNORMAL LOW (ref 22–32)
Calcium: 8.5 mg/dL — ABNORMAL LOW (ref 8.9–10.3)
Chloride: 102 mmol/L (ref 98–111)
Creatinine, Ser: 2.77 mg/dL — ABNORMAL HIGH (ref 0.61–1.24)
GFR, Estimated: 23 mL/min — ABNORMAL LOW (ref >60)
Glucose, Bld: 297 mg/dL — ABNORMAL HIGH (ref 70–99)
Potassium: 3.6 mmol/L (ref 3.5–5.1)
Sodium: 132 mmol/L — ABNORMAL LOW (ref 135–145)
Total Bilirubin: 0.5 mg/dL (ref 0.3–1.2)
Total Protein: 5.7 g/dL — ABNORMAL LOW (ref 6.5–8.1)

## 2020-05-03 LAB — C-REACTIVE PROTEIN: CRP: 2.8 mg/dL — ABNORMAL HIGH (ref <1.0)

## 2020-05-03 LAB — FERRITIN: Ferritin: 2865 ng/mL — ABNORMAL HIGH (ref 24–336)

## 2020-05-03 LAB — PHOSPHORUS: Phosphorus: 3.1 mg/dL (ref 2.5–4.6)

## 2020-05-03 LAB — GLUCOSE, CAPILLARY
Glucose-Capillary: 174 mg/dL — ABNORMAL HIGH (ref 70–99)
Glucose-Capillary: 289 mg/dL — ABNORMAL HIGH (ref 70–99)

## 2020-05-03 LAB — MAGNESIUM: Magnesium: 1.6 mg/dL — ABNORMAL LOW (ref 1.7–2.4)

## 2020-05-03 LAB — D-DIMER, QUANTITATIVE: D-Dimer, Quant: 2.63 ug/mL-FEU — ABNORMAL HIGH (ref 0.00–0.50)

## 2020-05-03 MED ORDER — IPRATROPIUM-ALBUTEROL 20-100 MCG/ACT IN AERS: 1.0000 | INHALATION_SPRAY | Freq: Two times a day (BID) | RESPIRATORY_TRACT | Status: DC

## 2020-05-03 MED ORDER — PREDNISONE 20 MG PO TABS: 40.0000 mg | ORAL_TABLET | Freq: Every day | ORAL | 0 refills | Status: DC

## 2020-05-03 MED ORDER — GUAIFENESIN-DM 100-10 MG/5ML PO SYRP: 10.0000 mL | ORAL_SOLUTION | ORAL | 0 refills | Status: DC | PRN

## 2020-05-03 NOTE — Discharge Summary (Signed)
Physician Discharge Summary  Dave Brown SEG:[id number] DOB: 01/15/1946 DOA: 05/01/2020  PCP: Aletha Halim., PA-C  Admit date: 05/01/2020 Discharge date: 05/03/2020  Admitted From: Home Disposition: Home  Recommendations for Outpatient Follow-up:  1. Follow up with PCP in 1-2 weeks 2. Please obtain BMP/CBC in one week 3. Patient will need to isolate at home until 1/17  Discharge Condition: Stable CODE STATUS: Full code Diet recommendation: Heart healthy  Brief/Interim Summary: 75 year old male admitted to the hospital with dehydration.  He was found to have acute kidney injury on chronic kidney disease stage III.  Patient was having frequent diarrhea secondary to COVID-19.  Imaging in the emergency room indicated possible COVID-19 pneumonia.  He was admitted for IV fluids, remdesivir and steroids  Discharge Diagnoses:  Principal Problem:   Acute renal failure (ARF) ([place]) Active Problems:   MGUS (monoclonal gammopathy of unknown significance)   Pneumonia due to COVID-19 virus   Sleep apnea   CKD (chronic kidney disease)   Hypertension   Degenerative joint disease   High cholesterol   Obesity  Acute kidney injury on chronic kidney disease stage IIIb. -Baseline creatinine of 2.5 -Presents with a creatinine of 3.4, secondary to volume depletion -Renal function improving with hydration -On discharge, creatinine improved to 2.7  Normal anion gap metabolic acidosis -Suspect is related to diarrhea -Improved with bicarbonate infusion  Hyponatremia -Secondary to volume loss from GI tract -Improved with IV fluids  COVID-19 pneumonia -Started on steroids and remdesivir -Continue pulmonary hygiene -Since patient's condition has improved, CRP has trended down, he has been transitioned to prednisone taper   Discharge Instructions  Discharge Instructions    Diet - low sodium heart healthy   Complete by: As directed    Increase activity slowly   Complete by: As directed       Allergies as of 05/03/2020   No Known Allergies     Medication List    STOP taking these medications   hydrochlorothiazide 25 MG tablet Commonly known as: HYDRODIURIL   lisinopril 10 MG tablet Commonly known as: ZESTRIL     TAKE these medications   amLODipine 5 MG tablet Commonly known as: NORVASC Take 5 mg by mouth daily.   guaiFENesin-dextromethorphan 100-10 MG/5ML syrup Commonly known as: ROBITUSSIN DM Take 10 mLs by mouth every 4 (four) hours as needed for cough.   Ipratropium-Albuterol 20-100 MCG/ACT Aers respimat Commonly known as: COMBIVENT Inhale 1 puff into the lungs 2 (two) times daily.   metoprolol succinate 50 MG 24 hr tablet Commonly known as: TOPROL-XL Take 50 mg by mouth daily.   predniSONE 20 MG tablet Commonly known as: DELTASONE Take 2 tablets (40 mg total) by mouth daily.   rosuvastatin 10 MG tablet Commonly known as: CRESTOR Take 10 mg by mouth daily.   sertraline 25 MG tablet Commonly known as: ZOLOFT Take 25 mg by mouth daily.       No Known Allergies  Consultations:     Procedures/Studies: DG Chest Port 1 View  Result Date: 05/01/2020 CLINICAL DATA:  Cough, COVID exposure EXAM: PORTABLE CHEST 1 VIEW COMPARISON:  12/28/2010 FINDINGS: The lungs are symmetrically well inflated. Patchy peripheral pulmonary opacities are seen within the a right mid and lower lung zone, possibly infectious in the acute setting. Bronchitic changes are noted with interstitial thickening best appreciated within the perihilar regions. No pneumothorax or pleural effusion. Cardiac size within normal limits. No acute bone abnormality. IMPRESSION: Patchy pulmonary infiltrates within the a right peripheral mid and lower lung  zone, likely infectious or inflammatory in the acute setting. Superimposed changes of probable airway inflammation.  In Electronically Signed   By: Fidela Salisbury MD   On: 05/01/2020 04:51       Subjective: Feeling better.  No longer  having any vomiting or diarrhea.  Denies any shortness of breath.  Discharge Exam: Vitals:   05/02/20 2051 05/02/20 2126 05/03/20 0420 05/03/20 0817  BP:  129/69 125/66   Pulse:  81 77   Resp:  19 18   Temp:  98 F (36.7 C) 97.9 F (36.6 C)   TempSrc:      SpO2: 93% 96% 98% 94%  Weight:      Height:        General: Pt is alert, awake, not in acute distress Cardiovascular: RRR, S1/S2 +, no rubs, no gallops Respiratory: CTA bilaterally, no wheezing, no rhonchi Abdominal: Soft, NT, ND, bowel sounds + Extremities: no edema, no cyanosis    The results of significant diagnostics from this hospitalization (including imaging, microbiology, ancillary and laboratory) are listed below for reference.     Microbiology: Recent Results (from the past 240 hour(s))  Resp Panel by RT-PCR (Flu A&B, Covid) Nasopharyngeal Swab     Status: Abnormal   Collection Time: 05/01/20  4:38 AM   Specimen: Nasopharyngeal Swab; Nasopharyngeal(NP) swabs in vial transport medium  Result Value Ref Range Status   SARS Coronavirus 2 by RT PCR POSITIVE (A) NEGATIVE Final    Comment: RESULT CALLED TO, READ BACK BY AND VERIFIED WITH: THOMPSON,R AT 1638 BY HUFFINES,S ON 05/01/20. (NOTE) SARS-CoV-2 target nucleic acids are DETECTED.  The SARS-CoV-2 RNA is generally detectable in upper respiratory specimens during the acute phase of infection. Positive results are indicative of the presence of the identified virus, but do not rule out bacterial infection or co-infection with other pathogens not detected by the test. Clinical correlation with patient history and other diagnostic information is necessary to determine patient infection status. The expected result is Negative.  Fact Sheet for Patients: EntrepreneurPulse.com.au  Fact Sheet for Healthcare Providers: IncredibleEmployment.be  This test is not yet approved or cleared by the [place] FDA and  has been  authorized for detection and/or diagnosis of SARS-CoV-2 by FDA under an Emergency Use Authorization (EUA).  This EUA will remain in effect (meaning this  test can be used) for the duration of  the COVID-19 declaration under Section 564(b)(1) of the Act, 21 U.S.C. section 360bbb-3(b)(1), unless the authorization is terminated or revoked sooner.     Influenza A by PCR NEGATIVE NEGATIVE Final   Influenza B by PCR NEGATIVE NEGATIVE Final    Comment: (NOTE) The Xpert Xpress SARS-CoV-2/FLU/RSV plus assay is intended as an aid in the diagnosis of influenza from Nasopharyngeal swab specimens and should not be used as a sole basis for treatment. Nasal washings and aspirates are unacceptable for Xpert Xpress SARS-CoV-2/FLU/RSV testing.  Fact Sheet for Patients: EntrepreneurPulse.com.au  Fact Sheet for Healthcare Providers: IncredibleEmployment.be  This test is not yet approved or cleared by the [place] FDA and has been authorized for detection and/or diagnosis of SARS-CoV-2 by FDA under an Emergency Use Authorization (EUA). This EUA will remain in effect (meaning this test can be used) for the duration of the COVID-19 declaration under Section 564(b)(1) of the Act, 21 U.S.C. section 360bbb-3(b)(1), unless the authorization is terminated or revoked.  Performed at [facility], [address], [place], [place] [address]      Labs: BNP (last 3 results) No  results for input(s): BNP in the last 8760 hours. Basic Metabolic Panel: Recent Labs  Lab 05/01/20 0438 05/02/20 1437 05/03/20 1120  NA 128* 129* 132*  K 3.4* 3.6 3.6  CL 97* 103 102  CO2 18* 14* 20*  GLUCOSE 113* 233* 297*  BUN 71* 67* 66*  CREATININE 3.49* 3.06* 2.77*  CALCIUM 9.0 8.8* 8.5*  MG 1.9 1.9 1.6*  PHOS  --  3.6 3.1   Liver Function Tests: Recent Labs  Lab 05/01/20 0438 05/02/20 1437 05/03/20 1120  AST 49* 41 33  ALT 25 27 25   ALKPHOS 60 58 49  BILITOT 0.7 0.4 0.5   PROT 7.3 6.6 5.7*  ALBUMIN 3.0* 2.6* 2.4*   No results for input(s): LIPASE, AMYLASE in the last 168 hours. No results for input(s): AMMONIA in the last 168 hours. CBC: Recent Labs  Lab 05/01/20 0438 05/02/20 1437 05/03/20 1120  WBC 6.9 9.3 8.0  NEUTROABS 5.7 8.4* 7.4  HGB 15.0 14.5 13.2  HCT 44.7 42.6 38.9*  MCV 89.9 90.8 90.9  PLT 86* 134* 109*   Cardiac Enzymes: No results for input(s): CKTOTAL, CKMB, CKMBINDEX, TROPONINI in the last 168 hours. BNP: Invalid input(s): POCBNP CBG: Recent Labs  Lab 05/02/20 2126 05/03/20 0905 05/03/20 1131  GLUCAP 259* 174* 289*   D-Dimer Recent Labs    05/02/20 1437 05/03/20 1120  DDIMER 2.47* 2.63*   Hgb A1c Recent Labs    05/02/20 1437  HGBA1C 6.9*   Lipid Profile No results for input(s): CHOL, HDL, LDLCALC, TRIG, CHOLHDL, LDLDIRECT in the last 72 hours. Thyroid function studies No results for input(s): TSH, T4TOTAL, T3FREE, THYROIDAB in the last 72 hours.  Invalid input(s): FREET3 Anemia work up Recent Labs    05/02/20 1437 05/03/20 1120  FERRITIN 2,750* 2,865*   Urinalysis No results found for: COLORURINE, APPEARANCEUR, LABSPEC, [place], GLUCOSEU, [place], [place], [place], PROTEINUR, UROBILINOGEN, NITRITE, LEUKOCYTESUR Sepsis Labs Invalid input(s): PROCALCITONIN,  WBC,  LACTICIDVEN Microbiology Recent Results (from the past 240 hour(s))  Resp Panel by RT-PCR (Flu A&B, Covid) Nasopharyngeal Swab     Status: Abnormal   Collection Time: 05/01/20  4:38 AM   Specimen: Nasopharyngeal Swab; Nasopharyngeal(NP) swabs in vial transport medium  Result Value Ref Range Status   SARS Coronavirus 2 by RT PCR POSITIVE (A) NEGATIVE Final    Comment: RESULT CALLED TO, READ BACK BY AND VERIFIED WITH: THOMPSON,R AT 9147 BY HUFFINES,S ON 05/01/20. (NOTE) SARS-CoV-2 target nucleic acids are DETECTED.  The SARS-CoV-2 RNA is generally detectable in upper respiratory specimens during the acute phase of infection. Positive  results are indicative of the presence of the identified virus, but do not rule out bacterial infection or co-infection with other pathogens not detected by the test. Clinical correlation with patient history and other diagnostic information is necessary to determine patient infection status. The expected result is Negative.  Fact Sheet for Patients: EntrepreneurPulse.com.au  Fact Sheet for Healthcare Providers: IncredibleEmployment.be  This test is not yet approved or cleared by the [place] FDA and  has been authorized for detection and/or diagnosis of SARS-CoV-2 by FDA under an Emergency Use Authorization (EUA).  This EUA will remain in effect (meaning this  test can be used) for the duration of  the COVID-19 declaration under Section 564(b)(1) of the Act, 21 U.S.C. section 360bbb-3(b)(1), unless the authorization is terminated or revoked sooner.     Influenza A by PCR NEGATIVE NEGATIVE Final   Influenza B by PCR NEGATIVE NEGATIVE Final    Comment: (NOTE)  The Xpert Xpress SARS-CoV-2/FLU/RSV plus assay is intended as an aid in the diagnosis of influenza from Nasopharyngeal swab specimens and should not be used as a sole basis for treatment. Nasal washings and aspirates are unacceptable for Xpert Xpress SARS-CoV-2/FLU/RSV testing.  Fact Sheet for Patients: EntrepreneurPulse.com.au  Fact Sheet for Healthcare Providers: IncredibleEmployment.be  This test is not yet approved or cleared by the [place] FDA and has been authorized for detection and/or diagnosis of SARS-CoV-2 by FDA under an Emergency Use Authorization (EUA). This EUA will remain in effect (meaning this test can be used) for the duration of the COVID-19 declaration under Section 564(b)(1) of the Act, 21 U.S.C. section 360bbb-3(b)(1), unless the authorization is terminated or revoked.  Performed at [facility], [address]  [place], [place] [address]      Time coordinating discharge: 6mins  SIGNED:   Kathie Dike, MD  [facility] 05/03/2020, 9:05 PM   If 7PM-7AM, please contact night-coverage [email or web address]

## 2020-05-03 NOTE — Plan of Care (Signed)

## 2020-05-04 LAB — GLUCOSE, CAPILLARY: Glucose-Capillary: 211 mg/dL — ABNORMAL HIGH (ref 70–99)

## 2020-05-13 DIAGNOSIS — U071 COVID-19: Secondary | ICD-10-CM

## 2020-05-13 DIAGNOSIS — N189 Chronic kidney disease, unspecified: Secondary | ICD-10-CM

## 2020-05-13 DIAGNOSIS — N179 Acute kidney failure, unspecified: Secondary | ICD-10-CM

## 2022-02-17 IMAGING — DX DG CHEST 1V PORT
1 series · 1 of 1 positions shown · non-contrast
Comparison: 12/28/2010

CLINICAL DATA: Cough, COVID exposure

EXAM:
PORTABLE CHEST 1 VIEW

[chest ap grid]
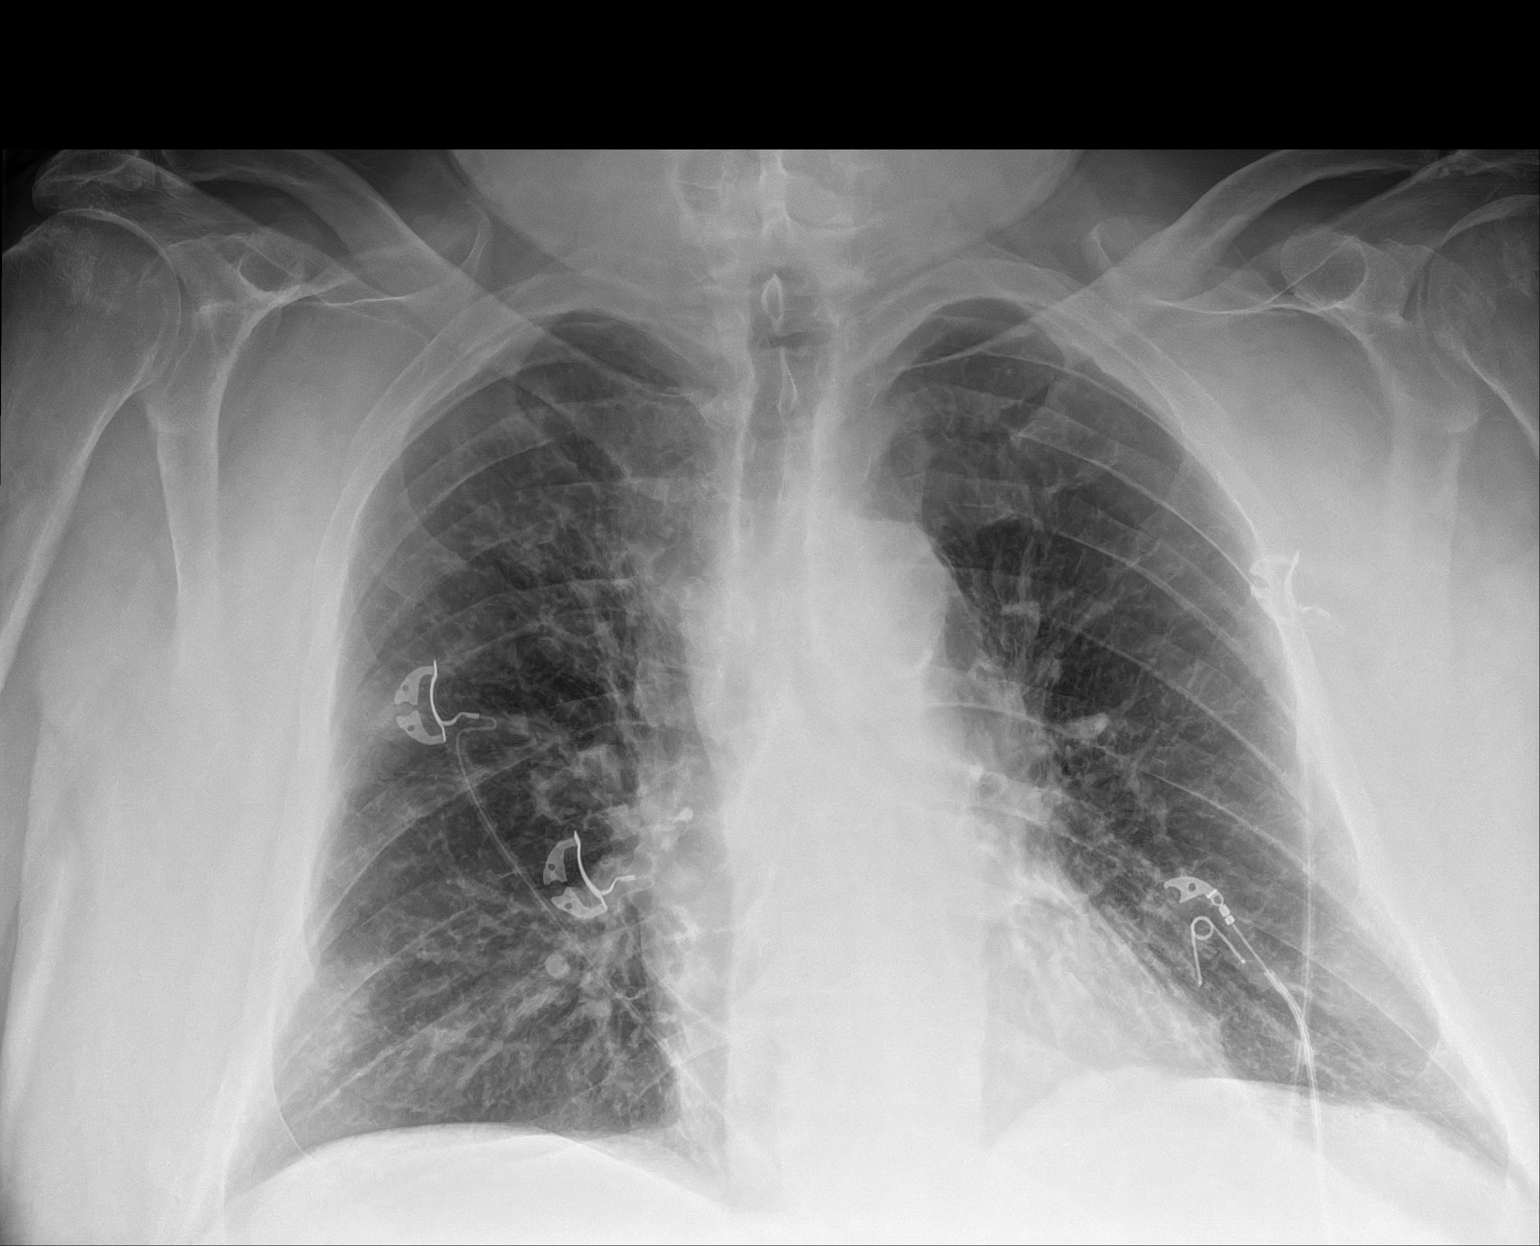

[1 of 1 positions shown; findings below may reference images not displayed]

FINDINGS: The lungs are symmetrically well inflated. Patchy peripheral
pulmonary opacities are seen within the a right mid and lower lung
zone, possibly infectious in the acute setting. Bronchitic changes
are noted with interstitial thickening best appreciated within the
perihilar regions. No pneumothorax or pleural effusion. Cardiac size
within normal limits. No acute bone abnormality.
IMPRESSION: Patchy pulmonary infiltrates within the a right peripheral mid and
lower lung zone, likely infectious or inflammatory in the acute
setting.

Superimposed changes of probable airway inflammation.  In

## 2022-02-23 ENCOUNTER — Ambulatory Visit: Payer: Medicare HMO | Admitting: Cardiology

## 2022-02-23 ENCOUNTER — Encounter: Payer: Self-pay | Admitting: Cardiology

## 2022-02-23 VITALS — BP 109/65 | HR 74 | Resp 16 | Ht 70.0 in | Wt 294.0 lb

## 2022-02-23 DIAGNOSIS — D472 Monoclonal gammopathy: Secondary | ICD-10-CM

## 2022-02-23 DIAGNOSIS — R7989 Other specified abnormal findings of blood chemistry: Secondary | ICD-10-CM | POA: Insufficient documentation

## 2022-02-23 DIAGNOSIS — R600 Localized edema: Secondary | ICD-10-CM | POA: Insufficient documentation

## 2022-02-23 DIAGNOSIS — N184 Chronic kidney disease, stage 4 (severe): Secondary | ICD-10-CM

## 2022-02-23 MED ORDER — FUROSEMIDE 40 MG PO TABS: 40.0000 mg | ORAL_TABLET | Freq: Every day | ORAL | 3 refills | Status: DC

## 2022-02-23 NOTE — Progress Notes (Signed)
Patient referred by Aletha Halim., PA-C for leg edema  Subjective:   Dave Brown, male    DOB: Dec 13, 1945, 76 y.o.   MRN: [id number]   Chief Complaint  Patient presents with   Hypertension   New Patient (Initial Visit)   Dizziness     HPI  76 y.o. Caucasian male with hypertension, hyperlipidemia, obesity, CKD 4, OSA-not on CPAP, MGUS, referred for evaluation of leg edema, elevated BNP  Patient is here with his daughter [name].  Patient 5 days ago, and has lived fairly sedentary lifestyle since then.  Over the last few weeks, he has had worsening leg edema and exertional dyspnea, as well as orthopnea.  He denies any chest pain, palpitations, reports lightheadedness, but no syncope.  He was diagnosed with MGUS several years ago, and has seen Dr. Earlie Server in the past.  He has not seen him recently.  Patient has known OSA, but is not using CPAP currently.  He has seen nephrology in the past, but has not followed up in the recent times.  His creatinine has worsened over  Time.   Past Medical History:  Diagnosis Date   Degenerative joint disease    High cholesterol    Hypertension    Kidney stones    Obesity    Sleep apnea      History reviewed. No pertinent surgical history.   Social History   Tobacco Use  Smoking Status Former   Types: Cigars   Quit date: 05/07/1998   Years since quitting: 23.8  Smokeless Tobacco Not on file    Social History   Substance and Sexual Activity  Alcohol Use No     History reviewed. No pertinent family history.    Current Outpatient Medications:    amLODipine (NORVASC) 5 MG tablet, Take 5 mg by mouth daily., Disp: , Rfl:    Ipratropium-Albuterol (COMBIVENT) 20-100 MCG/ACT AERS respimat, Inhale 1 puff into the lungs 2 (two) times daily., Disp: , Rfl:    lisinopril (ZESTRIL) 10 MG tablet, Take 10 mg by mouth daily., Disp: , Rfl:    metoprolol succinate (TOPROL-XL) 50 MG 24 hr tablet, Take 50 mg by mouth daily., Disp: , Rfl:     rosuvastatin (CRESTOR) 10 MG tablet, Take 10 mg by mouth daily., Disp: , Rfl:    sertraline (ZOLOFT) 25 MG tablet, Take 25 mg by mouth daily., Disp: , Rfl:    spironolactone (ALDACTONE) 25 MG tablet, Take 25 mg by mouth daily., Disp: , Rfl:    Cardiovascular and other pertinent studies:  Reviewed external labs and tests, independently interpreted  EKG 02/23/2022: Sinus rhythm 73 bpm First degree A-V block  Left anterior fascicular block   Recent labs: 02/15/2022: Glucose 95, BUN/Cr 31/2.29. EGFR 29. Na/K 139/4.8.  BNP 157 Rest of the CMP normal  01/2021: H/H 14/45. MCV 95. Platelets 136 HbA1C 6.6% Chol 150, TG 134, HDL 38, LDL 93   Review of Systems  Constitutional: Positive for malaise/fatigue.  Cardiovascular:  Positive for dyspnea on exertion, leg swelling and orthopnea. Negative for chest pain, palpitations and syncope.         Vitals:   02/23/22 1113 02/23/22 1114  BP:    Pulse:    Resp:    SpO2: 94% 96%    Orthostatic VS for the past 72 hrs (Last 3 readings):  Orthostatic BP Patient Position BP Location Cuff Size Orthostatic Pulse  02/23/22 1114 101/43 Standing Left Arm Large 85  02/23/22 1113 101/58 Sitting Left Arm  Large 73  02/23/22 1110 117/58 Supine Left Arm Large 69     Body mass index is 42.18 kg/m. Filed Weights   02/23/22 1102  Weight: 294 lb (133.4 kg)     Objective:   Physical Exam Vitals and nursing note reviewed.  Constitutional:      General: He is not in acute distress.    Appearance: He is obese.  Neck:     Vascular: No JVD.  Cardiovascular:     Rate and Rhythm: Normal rate and regular rhythm.     Heart sounds: Normal heart sounds. No murmur heard. Pulmonary:     Effort: Pulmonary effort is normal.     Breath sounds: Normal breath sounds. No wheezing or rales.  Musculoskeletal:     Right lower leg: Edema (2+) present.     Left lower leg: Edema (1+) present.         Visit diagnoses:   ICD-10-CM   1. Elevated  brain natriuretic peptide (BNP) level  R79.89 EKG 12-Lead    PCV ECHOCARDIOGRAM COMPLETE    2. Leg edema  R60.0 PCV ECHOCARDIOGRAM COMPLETE    furosemide (LASIX) 40 MG tablet    3. CKD (chronic kidney disease) stage 4, GFR 15-29 ml/min (HCC)  N18.4 Ambulatory referral to Nephrology    4. MGUS (monoclonal gammopathy of unknown significance)  D47.2 PCV ECHOCARDIOGRAM COMPLETE    Ambulatory referral to Hematology / Oncology       Orders Placed This Encounter  Procedures   Ambulatory referral to Hematology / Oncology   Ambulatory referral to Nephrology   EKG 12-Lead   PCV ECHOCARDIOGRAM COMPLETE     Meds ordered this encounter  Medications   furosemide (LASIX) 40 MG tablet    Sig: Take 1 tablet (40 mg total) by mouth daily.    Dispense:  90 tablet    Refill:  3     Assessment & Recommendations:    76 y.o. Caucasian male with hypertension, hyperlipidemia, obesity, CKD 4, OSA-not on CPAP, MGUS, referred for evaluation of leg edema, elevated BNP  Heart failure: Symptoms of leg edema, exertional dyspnea, elevated BNP, suspect congestive heart failure.  It remains to be seen if this is HFrEF or HFpEF.  Recommend echocardiogram with strain imaging.  Started Lasix 40 mg daily. Given his prior history of MGUS, and CKD 4, there is a small possibility of amyloidosis.  Pending echocardiogram, I recommend reestablishing care with hematologist Dr. Earlie Server. I have not discontinued his current medications that include spironolactone and lisinopril.  Strongly recommend establishing care with nephrology to assess if he can continue the above medications.  Okay to continue metoprolol succinate.  I have stopped amlodipine to reduce confounding factor for his leg edema. Discussed low-salt diet.  Patient has no insight in his dietary restrictions.  His son cooks, mostly with canned food.  Strongly recommend avoiding adding salt in foods like canned food, fries, pickles, outside food etc. Recommend  patient monitoring and chronic care management with our pharmacist Haynes Dage, especially to monitor his daily weights closely.  Further recommendations after above testing.  Thank you for referring the patient to [place]. Please feel free to contact with any questions.   Nigel Mormon, MD Pager: [phone] Office: [phone]

## 2022-02-25 ENCOUNTER — Telehealth: Payer: Self-pay | Admitting: Internal Medicine

## 2022-02-25 NOTE — Telephone Encounter (Signed)
Scheduled appointment per 10/31 referral . Patient is aware of appointment date and time. Patient is aware to arrive 15 mins prior to appointment time and to bring updated insurance cards. Patient is aware of location.

## 2022-03-03 ENCOUNTER — Telehealth: Payer: Self-pay

## 2022-03-03 NOTE — Telephone Encounter (Signed)
1-week initial follow-up from starting home weight monitoring with RPM. Started lasix '40mg'$  at last visit. Weights have been fluctuating. Unable to get ahold of patient yet for initial follow-up.   Average Weight Level 278.27 lbs Lowest Weight Level 273.4 lbs Highest Weight Level 287.4 lbs  03/03/2022 Thursday at 08:03 AM 275.6      03/02/2022 Wednesday at 07:20 AM 273.4      03/01/2022 Tuesday at 08:31 AM 278      02/28/2022 Monday at 07:47 AM 277.2      02/27/2022 'Sunday at 07:33 AM 278.2      02/26/2022 Saturday at 08:41 AM 282      02/25/2022 Friday at 08:25 AM 274.4      11'$ /05/2021 Thursday at 08:09 AM 287.4

## 2022-03-14 ENCOUNTER — Ambulatory Visit: Payer: Medicare HMO

## 2022-03-14 DIAGNOSIS — D472 Monoclonal gammopathy: Secondary | ICD-10-CM

## 2022-03-14 DIAGNOSIS — R7989 Other specified abnormal findings of blood chemistry: Secondary | ICD-10-CM

## 2022-03-14 DIAGNOSIS — R600 Localized edema: Secondary | ICD-10-CM

## 2022-03-15 ENCOUNTER — Other Ambulatory Visit: Payer: Self-pay | Admitting: Internal Medicine

## 2022-03-15 DIAGNOSIS — D472 Monoclonal gammopathy: Secondary | ICD-10-CM

## 2022-03-16 ENCOUNTER — Inpatient Hospital Stay: Payer: Medicare HMO

## 2022-03-16 ENCOUNTER — Inpatient Hospital Stay: Payer: Medicare HMO | Admitting: Internal Medicine

## 2022-03-28 ENCOUNTER — Encounter: Payer: Self-pay | Admitting: Cardiology

## 2022-03-28 ENCOUNTER — Telehealth: Payer: Self-pay

## 2022-03-28 ENCOUNTER — Ambulatory Visit: Payer: Medicare HMO | Admitting: Cardiology

## 2022-03-28 VITALS — BP 133/74 | HR 69 | Resp 21 | Ht 70.0 in | Wt 288.4 lb

## 2022-03-28 DIAGNOSIS — I10 Essential (primary) hypertension: Secondary | ICD-10-CM

## 2022-03-28 DIAGNOSIS — I5032 Chronic diastolic (congestive) heart failure: Secondary | ICD-10-CM | POA: Insufficient documentation

## 2022-03-28 DIAGNOSIS — R600 Localized edema: Secondary | ICD-10-CM

## 2022-03-28 MED ORDER — FUROSEMIDE 40 MG PO TABS: 40.0000 mg | ORAL_TABLET | Freq: Every day | ORAL | 3 refills | Status: DC

## 2022-03-28 NOTE — Telephone Encounter (Signed)
Patient weight has been fluctuating at home.  Average Weight Level 273.53 lbs Lowest Weight Level 272.6 lbs Highest Weight Level 274.4 lbs  03/27/2022 'Sunday at 07:56 AM 274.4      03/26/2022 Saturday at 08:41 AM 272.6      03/25/2022 Friday at 07:02 AM 273.6      03/24/2022 Thursday at 09:21 AM 280.2      03/23/2022 Wednesday at 07:38 AM 279      03/22/2022 Tuesday at 07:14 AM 271.2      03/21/2022 Monday at 07:02 AM 279.8      03/20/2022 Sunday at 08:07 AM 277.2      11'$ /25/2023 Saturday at 08:10 AM 279.8

## 2022-03-28 NOTE — Progress Notes (Signed)
Patient referred by Aletha Halim., PA-C for leg edema  Subjective:   Dave Brown, male    DOB: Jan 28, 1946, 76 y.o.   MRN: [id number]   Chief Complaint  Patient presents with   Results    Echocardiogram     HPI  76 y.o. Caucasian male with hypertension, hyperlipidemia, obesity, CKD 4, OSA-not on CPAP, MGUS, referred for evaluation of leg edema, elevated BNP  Leg edema has improved. Reviewed recent test results with the patient, details below.   Initial consultation visit 02/2022: Patient is here with his daughter Dave Brown.  Patient 5 days ago, and has lived fairly sedentary lifestyle since then.  Over the last few weeks, he has had worsening leg edema and exertional dyspnea, as well as orthopnea.  He denies any chest pain, palpitations, reports lightheadedness, but no syncope.  He was diagnosed with MGUS several years ago, and has seen Dr. Earlie Server in the past.  He has not seen him recently.  Patient has known OSA, but is not using CPAP currently.  He has seen nephrology in the past, but has not followed up in the recent times.  His creatinine has worsened over    Current Outpatient Medications:    furosemide (LASIX) 40 MG tablet, Take 1 tablet (40 mg total) by mouth daily., Disp: 90 tablet, Rfl: 3   Ipratropium-Albuterol (COMBIVENT) 20-100 MCG/ACT AERS respimat, Inhale 1 puff into the lungs 2 (two) times daily., Disp: , Rfl:    lisinopril (ZESTRIL) 10 MG tablet, Take 10 mg by mouth daily., Disp: , Rfl:    metoprolol succinate (TOPROL-XL) 50 MG 24 hr tablet, Take 50 mg by mouth daily., Disp: , Rfl:    rosuvastatin (CRESTOR) 10 MG tablet, Take 10 mg by mouth daily., Disp: , Rfl:    sertraline (ZOLOFT) 25 MG tablet, Take 25 mg by mouth daily., Disp: , Rfl:    spironolactone (ALDACTONE) 25 MG tablet, Take 25 mg by mouth daily., Disp: , Rfl:    Cardiovascular and other pertinent studies:  Reviewed external labs and tests, independently interpreted  Echocardiogram  03/14/2022: Normal LV systolic function with visual EF 55-60%. Left ventricle cavity is normal in size. Moderate concentric hypertrophy of the left ventricle. Normal global wall motion. Doppler evidence of grade I (impaired) diastolic dysfunction, normal LAP. Calculated EF 60%. Structurally normal tricuspid valve with trace regurgitation. No evidence of pulmonary hypertension. No significant change compared to 12/2010.   EKG 02/23/2022: Sinus rhythm 73 bpm First degree A-V block  Left anterior fascicular block   Recent labs: 02/15/2022: Glucose 95, BUN/Cr 31/2.29. EGFR 29. Na/K 139/4.8.  BNP 157 Rest of the CMP normal  01/2021: H/H 14/45. MCV 95. Platelets 136 HbA1C 6.6% Chol 150, TG 134, HDL 38, LDL 93   Review of Systems  Constitutional: Positive for malaise/fatigue.  Cardiovascular:  Positive for dyspnea on exertion and orthopnea. Negative for chest pain, leg swelling, palpitations and syncope.         Vitals:   03/28/22 1305  BP: 133/74  Pulse: 69  Resp: (!) 21  SpO2: 95%    Body mass index is 41.38 kg/m. Filed Weights   03/28/22 1305  Weight: 288 lb 6.4 oz (130.8 kg)     Objective:   Physical Exam Vitals and nursing note reviewed.  Constitutional:      General: He is not in acute distress.    Appearance: He is obese.  Neck:     Vascular: No JVD.  Cardiovascular:  Rate and Rhythm: Normal rate and regular rhythm.     Heart sounds: Normal heart sounds. No murmur heard. Pulmonary:     Effort: Pulmonary effort is normal.     Breath sounds: Normal breath sounds. No wheezing or rales.  Musculoskeletal:     Right lower leg: No edema.     Left lower leg: No edema.         Visit diagnoses:   ICD-10-CM   1. Chronic heart failure with preserved ejection fraction (HCC)  I50.32     2. Leg edema  R60.0 furosemide (LASIX) 40 MG tablet    3. Primary hypertension  I10        Meds ordered this encounter  Medications   furosemide (LASIX) 40 MG  tablet    Sig: Take 1 tablet (40 mg total) by mouth daily.    Dispense:  90 tablet    Refill:  3     Assessment & Recommendations:    76 y.o. Caucasian male with hypertension, hyperlipidemia, obesity, CKD 4, OSA-not on CPAP, MGUS, HFpEF  HFpEF: Low suspicion of cardiac amyloidosis based on echocardiogram. Clinically euvolemic today,  Refilled lasix 40 mg daily.  Hypertension: Controlled  CKD: Continue f/u w/CKD  F/u in 3 months   Nigel Mormon, MD Pager: [phone] Office: [phone]

## 2022-03-30 ENCOUNTER — Ambulatory Visit: Payer: Medicare HMO | Admitting: Cardiology

## 2022-04-04 ENCOUNTER — Inpatient Hospital Stay: Payer: Medicare HMO | Admitting: Internal Medicine

## 2022-04-04 ENCOUNTER — Inpatient Hospital Stay: Payer: Medicare HMO

## 2022-04-04 ENCOUNTER — Telehealth: Payer: Self-pay | Admitting: Internal Medicine

## 2022-04-04 NOTE — Telephone Encounter (Signed)
Patient's daughter called to r/s appointment. Patient is r/s

## 2022-04-13 ENCOUNTER — Other Ambulatory Visit: Payer: Self-pay

## 2022-04-13 ENCOUNTER — Inpatient Hospital Stay (HOSPITAL_BASED_OUTPATIENT_CLINIC_OR_DEPARTMENT_OTHER): Payer: Medicare HMO | Admitting: Internal Medicine

## 2022-04-13 ENCOUNTER — Inpatient Hospital Stay: Payer: Medicare HMO | Attending: Internal Medicine

## 2022-04-13 VITALS — BP 128/71 | HR 71 | Temp 98.2°F | Resp 18 | Wt 283.6 lb

## 2022-04-13 DIAGNOSIS — Z87891 Personal history of nicotine dependence: Secondary | ICD-10-CM | POA: Insufficient documentation

## 2022-04-13 DIAGNOSIS — N189 Chronic kidney disease, unspecified: Secondary | ICD-10-CM | POA: Insufficient documentation

## 2022-04-13 DIAGNOSIS — D472 Monoclonal gammopathy: Secondary | ICD-10-CM | POA: Diagnosis present

## 2022-04-13 DIAGNOSIS — I130 Hypertensive heart and chronic kidney disease with heart failure and stage 1 through stage 4 chronic kidney disease, or unspecified chronic kidney disease: Secondary | ICD-10-CM | POA: Insufficient documentation

## 2022-04-13 DIAGNOSIS — I509 Heart failure, unspecified: Secondary | ICD-10-CM | POA: Diagnosis not present

## 2022-04-13 DIAGNOSIS — Z79899 Other long term (current) drug therapy: Secondary | ICD-10-CM | POA: Insufficient documentation

## 2022-04-13 LAB — CMP (CANCER CENTER ONLY)
ALT: 11 U/L (ref 0–44)
AST: 16 U/L (ref 15–41)
Albumin: 3.6 g/dL (ref 3.5–5.0)
Alkaline Phosphatase: 78 U/L (ref 38–126)
Anion gap: 8 (ref 5–15)
BUN: 40 mg/dL — ABNORMAL HIGH (ref 8–23)
CO2: 29 mmol/L (ref 22–32)
Calcium: 9.6 mg/dL (ref 8.9–10.3)
Chloride: 104 mmol/L (ref 98–111)
Creatinine: 2.86 mg/dL — ABNORMAL HIGH (ref 0.61–1.24)
GFR, Estimated: 22 mL/min — ABNORMAL LOW (ref >60)
Glucose, Bld: 133 mg/dL — ABNORMAL HIGH (ref 70–99)
Potassium: 3.8 mmol/L (ref 3.5–5.1)
Sodium: 141 mmol/L (ref 135–145)
Total Bilirubin: 0.5 mg/dL (ref 0.3–1.2)
Total Protein: 7.6 g/dL (ref 6.5–8.1)

## 2022-04-13 LAB — CBC WITH DIFFERENTIAL (CANCER CENTER ONLY)
Abs Immature Granulocytes: 0.02 10*3/uL (ref 0.00–0.07)
Basophils Absolute: 0 10*3/uL (ref 0.0–0.1)
Basophils Relative: 0 %
Eosinophils Absolute: 0.1 10*3/uL (ref 0.0–0.5)
Eosinophils Relative: 2 %
HCT: 44.7 % (ref 39.0–52.0)
Hemoglobin: 14.7 g/dL (ref 13.0–17.0)
Immature Granulocytes: 0 %
Lymphocytes Relative: 33 %
Lymphs Abs: 3 10*3/uL (ref 0.7–4.0)
MCH: 32.7 pg (ref 26.0–34.0)
MCHC: 32.9 g/dL (ref 30.0–36.0)
MCV: 99.3 fL (ref 80.0–100.0)
Monocytes Absolute: 0.6 10*3/uL (ref 0.1–1.0)
Monocytes Relative: 6 %
Neutro Abs: 5.2 10*3/uL (ref 1.7–7.7)
Neutrophils Relative %: 59 %
Platelet Count: 147 10*3/uL — ABNORMAL LOW (ref 150–400)
RBC: 4.5 MIL/uL (ref 4.22–5.81)
RDW: 13.2 % (ref 11.5–15.5)
WBC Count: 8.9 10*3/uL (ref 4.0–10.5)
nRBC: 0 % (ref 0.0–0.2)

## 2022-04-13 LAB — LACTATE DEHYDROGENASE: LDH: 110 U/L (ref 98–192)

## 2022-04-13 NOTE — Progress Notes (Signed)
[facility] Telephone:([phone]) [phone]   Fax:([phone]  CONSULT NOTE  REFERRING PHYSICIAN: Bing Matter, PA-C  REASON FOR CONSULTATION:  76 years old white male with history of monoclonal gammopathy of undetermined significance.  HPI Dave Brown is a 76 y.o. male with past medical history significant for degenerative joint disease, hypertension, dyslipidemia, kidney stone, history of sleep apnea, congestive heart failure, chronic kidney disease as well as history of monoclonal gammopathy of undetermined significance that was diagnosed in May 2013.  I have seen the patient at that time and followed him closely until March 2015 when he was lost to follow-up.  He had a bone marrow biopsy and aspirate on Sep 05, 2011 that showed trilineage hematopoiesis with slight plasmacytosis with estimated plasma cells of 10-15 with mixed pattern of kappa and lambda light chain staining.  His last myeloma panel on July 18, 2013 showed IgG of 1420, IgA 200 and IgM 81.  Free kappa light chain was 29 mg/DL.  Free lambda light chain was 3.29 mg/DL with a kappa lambda ratio of 8.81.  The patient was lost to follow-up since that time.  He was seen recently by his primary care provider and he was advised to follow-up with me again regarding his history of MGUS.  He was noted to have some deterioration of his renal function recently and he is followed by nephrology. When seen today he is feeling fine with no concerning complaints.  He denied having any chest pain, shortness of breath, cough or hemoptysis.  He has no nausea, vomiting, diarrhea or constipation.  He has no headache or visual changes.  He is also followed by cardiology for congestive heart failure. Family history significant for mother and father died from old age. The patient is a widow and has 4 children 3 sons and 1 daughter.  He was accompanied today by his daughter Dave Brown.  He used to work in [employer].  He has a history of smoking  but quit in 1980.  He also has a history of alcohol drinking but quit long time ago and no history of drug abuse. HPI  Past Medical History:  Diagnosis Date   Degenerative joint disease    High cholesterol    Hypertension    Kidney stones    Obesity    Sleep apnea     No past surgical history on file.  No family history on file.  Social History Social History   Tobacco Use   Smoking status: Former    Types: Cigars    Quit date: 05/07/1998    Years since quitting: 23.9  Vaping Use   Vaping Use: Never used  Substance Use Topics   Alcohol use: No   Drug use: No    No Known Allergies  Current Outpatient Medications  Medication Sig Dispense Refill   furosemide (LASIX) 40 MG tablet Take 1 tablet (40 mg total) by mouth daily. 90 tablet 3   Ipratropium-Albuterol (COMBIVENT) 20-100 MCG/ACT AERS respimat Inhale 1 puff into the lungs 2 (two) times daily.     lisinopril (ZESTRIL) 10 MG tablet Take 10 mg by mouth daily.     metoprolol succinate (TOPROL-XL) 50 MG 24 hr tablet Take 50 mg by mouth daily.     rosuvastatin (CRESTOR) 10 MG tablet Take 10 mg by mouth daily.     sertraline (ZOLOFT) 25 MG tablet Take 25 mg by mouth daily.     spironolactone (ALDACTONE) 25 MG tablet Take 25 mg by mouth  daily.     No current facility-administered medications for this visit.    Review of Systems  Constitutional: negative Eyes: negative Ears, nose, mouth, throat, and face: negative Respiratory: negative Cardiovascular: negative Gastrointestinal: negative Genitourinary:negative Integument/breast: negative Hematologic/lymphatic: negative Musculoskeletal:negative Neurological: negative Behavioral/Psych: negative Endocrine: negative Allergic/Immunologic: negative  Physical Exam  LDJ:TTSVX, healthy, no distress, well nourished, and well developed SKIN: skin color, texture, turgor are normal, no rashes or significant lesions HEAD: Normocephalic, No masses, lesions, tenderness or  abnormalities EYES: normal, PERRLA, Conjunctiva are pink and non-injected EARS: External ears normal, Canals clear OROPHARYNX:no exudate, no erythema, and lips, buccal mucosa, and tongue normal  NECK: supple, no adenopathy, no JVD LYMPH:  no palpable lymphadenopathy, no hepatosplenomegaly BREAST:not examined LUNGS: clear to auscultation , and palpation HEART: regular rate & rhythm, no murmurs, and no gallops ABDOMEN:abdomen soft, non-tender, obese, normal bowel sounds, and no masses or organomegaly BACK: Back symmetric, no curvature., No CVA tenderness EXTREMITIES:no joint deformities, effusion, or inflammation, no edema  NEURO: alert & oriented x 3 with fluent speech, no focal motor/sensory deficits  PERFORMANCE STATUS: ECOG 1  LABORATORY DATA: Lab Results  Component Value Date   WBC 8.9 04/13/2022   HGB 14.7 04/13/2022   HCT 44.7 04/13/2022   MCV 99.3 04/13/2022   PLT 147 (L) 04/13/2022      Chemistry      Component Value Date/Time   NA 132 (L) 05/03/2020 1120   NA 138 07/18/2013 0935   K 3.6 05/03/2020 1120   K 3.4 (L) 07/18/2013 0935   CL 102 05/03/2020 1120   CL 104 06/22/2012 0901   CO2 20 (L) 05/03/2020 1120   CO2 20 (L) 07/18/2013 0935   BUN 66 (H) 05/03/2020 1120   BUN 35.9 (H) 07/18/2013 0935   CREATININE 2.77 (H) 05/03/2020 1120   CREATININE 2.0 (H) 07/18/2013 0935      Component Value Date/Time   CALCIUM 8.5 (L) 05/03/2020 1120   CALCIUM 10.1 07/18/2013 0935   ALKPHOS 49 05/03/2020 1120   ALKPHOS 82 07/18/2013 0935   AST 33 05/03/2020 1120   AST 14 07/18/2013 0935   ALT 25 05/03/2020 1120   ALT 12 07/18/2013 0935   BILITOT 0.5 05/03/2020 1120   BILITOT 0.59 07/18/2013 0935       RADIOGRAPHIC STUDIES: No results found.  ASSESSMENT: This is a very pleasant 76 years old white male with history of monoclonal gammopathy of undetermined significance (MGUS) that was diagnosed in May 2013 and the patient was followed closely until March 2015 when he was  lost to follow-up.   PLAN: I had a lengthy discussion with the patient and his daughter today about his current condition and further investigation to rule out any disease progression. I recommended for the patient to have repeat myeloma panel today for evaluation of his disease. If the myeloma panel showed no concerning progression, I will continue to monitor him closely with repeat myeloma panel in 6 months and then annually after that. If there is any concerning abnormalities on the myeloma panel compared to several years ago, I would consider The patient for additional studies including repeat bone marrow biopsy as well as skeletal bone survey before discussion of any treatment options. For the congestive heart failure the patient will continue his routine follow-up visit by cardiology. For the renal insufficiency he is followed by nephrology. He was advised to call immediately if he has any other concerning symptoms in the interval.  The patient voices understanding of current disease  status and treatment options and is in agreement with the current care plan.  All questions were answered. The patient knows to call the clinic with any problems, questions or concerns. We can certainly see the patient much sooner if necessary.  Thank you so much for allowing me to participate in the care of Dave Brown. I will continue to follow up the patient with you and assist in his care.  The total time spent in the appointment was 60 minutes.  Disclaimer: This note was dictated with voice recognition software. Similar sounding words can inadvertently be transcribed and may not be corrected upon review.   Eilleen Kempf April 13, 2022, 2:26 PM

## 2022-04-14 LAB — IGG, IGA, IGM
IgA: 119 mg/dL (ref 61–437)
IgG (Immunoglobin G), Serum: 1379 mg/dL (ref 603–1613)
IgM (Immunoglobulin M), Srm: 71 mg/dL (ref 15–143)

## 2022-04-14 LAB — KAPPA/LAMBDA LIGHT CHAINS
Kappa free light chain: 779.2 mg/L — ABNORMAL HIGH (ref 3.3–19.4)
Kappa, lambda light chain ratio: 30.08 — ABNORMAL HIGH (ref 0.26–1.65)
Lambda free light chains: 25.9 mg/L (ref 5.7–26.3)

## 2022-04-14 LAB — BETA 2 MICROGLOBULIN, SERUM: Beta-2 Microglobulin: 5.7 mg/L — ABNORMAL HIGH (ref 0.6–2.4)

## 2022-06-27 ENCOUNTER — Encounter: Payer: Self-pay | Admitting: Cardiology

## 2022-06-27 ENCOUNTER — Ambulatory Visit: Payer: Medicare HMO | Admitting: Cardiology

## 2022-06-27 ENCOUNTER — Telehealth: Payer: Self-pay

## 2022-06-27 VITALS — BP 126/71 | HR 77 | Resp 16 | Ht 70.0 in | Wt 285.0 lb

## 2022-06-27 DIAGNOSIS — I10 Essential (primary) hypertension: Secondary | ICD-10-CM

## 2022-06-27 DIAGNOSIS — R600 Localized edema: Secondary | ICD-10-CM

## 2022-06-27 DIAGNOSIS — I5032 Chronic diastolic (congestive) heart failure: Secondary | ICD-10-CM

## 2022-06-27 DIAGNOSIS — N184 Chronic kidney disease, stage 4 (severe): Secondary | ICD-10-CM

## 2022-06-27 MED ORDER — FUROSEMIDE 40 MG PO TABS: 40.0000 mg | ORAL_TABLET | Freq: Every day | ORAL | 3 refills | Status: DC

## 2022-06-27 NOTE — Progress Notes (Signed)
Patient referred by Aletha Halim., PA-C for leg edema  Subjective:   Dave Brown, male    DOB: Dec 09, 1945, 77 y.o.   MRN: [id number]   Chief Complaint  Patient presents with   Congestive Heart Failure   Follow-up    3 month    HPI  77 y.o. Caucasian male with hypertension, hyperlipidemia, obesity, CKD 4, OSA-not on CPAP, MGUS, HFpEF  Patient weight fluctuates at home but overall is better than in office weights.   Weight            lb         --          273.9 (271.0 - 277.2)   06/27/22 8:00 AM                       272.2   lb                      06/26/22 8:00 AM                       277.2   lb                      06/25/22 8:25 AM                       276.0   lb                      06/24/22 8:49 AM                       276.2   lb                      06/24/22 8:48 AM                       273.4   lb                      06/23/22 8:48 AM                     276.8   lb                      06/22/22 8:47 AM                     273.2   lb                      06/21/22 8:48 AM                     273.6   Lb     Initial consultation visit 02/2022: Patient is here with his daughter [name].  Patient 5 days ago, and has lived fairly sedentary lifestyle since then.  Over the last few weeks, he has had worsening leg edema and exertional dyspnea, as well as orthopnea.  He denies any chest pain, palpitations, reports lightheadedness, but no syncope.  He was diagnosed with MGUS several years ago, and has seen Dr. Earlie Server in the past.  He has not seen him recently.  Patient has known OSA, but is not using CPAP currently.  He has seen nephrology in the past, but  has not followed up in the recent times.  His creatinine has worsened over    Current Outpatient Medications:    furosemide (LASIX) 40 MG tablet, Take 1 tablet (40 mg total) by mouth daily., Disp: 90 tablet, Rfl: 3   Ipratropium-Albuterol (COMBIVENT) 20-100 MCG/ACT AERS respimat, Inhale 1 puff into the lungs 2 (two) times daily.,  Disp: , Rfl:    lisinopril (ZESTRIL) 10 MG tablet, Take 10 mg by mouth daily., Disp: , Rfl:    metoprolol succinate (TOPROL-XL) 50 MG 24 hr tablet, Take 50 mg by mouth daily., Disp: , Rfl:    rosuvastatin (CRESTOR) 10 MG tablet, Take 10 mg by mouth daily., Disp: , Rfl:    sertraline (ZOLOFT) 25 MG tablet, Take 25 mg by mouth daily., Disp: , Rfl:    spironolactone (ALDACTONE) 25 MG tablet, Take 25 mg by mouth daily., Disp: , Rfl:    Cardiovascular and other pertinent studies:  Reviewed external labs and tests, independently interpreted  Echocardiogram 03/14/2022: Normal LV systolic function with visual EF 55-60%. Left ventricle cavity is normal in size. Moderate concentric hypertrophy of the left ventricle. Normal global wall motion. Doppler evidence of grade I (impaired) diastolic dysfunction, normal LAP. Calculated EF 60%. Structurally normal tricuspid valve with trace regurgitation. No evidence of pulmonary hypertension. No significant change compared to 12/2010.   EKG 02/23/2022: Sinus rhythm 73 bpm First degree A-V block  Left anterior fascicular block   Recent labs: 02/15/2022: Glucose 95, BUN/Cr 31/2.29. EGFR 29. Na/K 139/4.8.  BNP 157 Rest of the CMP normal  01/2021: H/H 14/45. MCV 95. Platelets 136 HbA1C 6.6% Chol 150, TG 134, HDL 38, LDL 93   Review of Systems  Constitutional: Positive for malaise/fatigue.  Cardiovascular:  Positive for dyspnea on exertion and orthopnea. Negative for chest pain, leg swelling, palpitations and syncope.         There were no vitals filed for this visit.   There is no height or weight on file to calculate BMI. There were no vitals filed for this visit.    Objective:   Physical Exam Vitals and nursing note reviewed.  Constitutional:      General: He is not in acute distress.    Appearance: He is obese.  Neck:     Vascular: No JVD.  Cardiovascular:     Rate and Rhythm: Normal rate and regular rhythm.     Heart  sounds: Normal heart sounds. No murmur heard. Pulmonary:     Effort: Pulmonary effort is normal.     Breath sounds: Normal breath sounds. No wheezing or rales.  Musculoskeletal:     Right lower leg: No edema.     Left lower leg: No edema.         Visit diagnoses:   ICD-10-CM   1. Chronic heart failure with preserved ejection fraction (HCC)  I50.32     2. Primary hypertension  I10     3. CKD (chronic kidney disease) stage 4, GFR 15-29 ml/min (HCC)  N18.4     4. Leg edema  R60.0 furosemide (LASIX) 40 MG tablet       Meds ordered this encounter  Medications   furosemide (LASIX) 40 MG tablet    Sig: Take 1 tablet (40 mg total) by mouth daily.    Dispense:  90 tablet    Refill:  3     Assessment & Recommendations:    77 y.o. Caucasian male with hypertension, hyperlipidemia, obesity, CKD 4, OSA-not on CPAP, MGUS, HFpEF  HFpEF: Low suspicion of cardiac amyloidosis based on echocardiogram. Clinically euvolemic today. Weights improving as per home check.  Refilled lasix 40 mg daily. Not a candidate for SGLT2i, MRA due to renal dysfunction.  Hypertension: Controlled  CKD: Continue f/u w/CKD  F/u in 6 months   Nigel Mormon, MD Pager: [phone] Office: [phone]

## 2022-06-27 NOTE — Telephone Encounter (Signed)
Patient weight fluctuates at home but overall is better than in office weights.  Weight lb -- 273.9 (271.0 - 277.2)  06/27/22 8:00 AM  272.2 lb    06/26/22 8:00 AM  277.2 lb    06/25/22 8:25 AM  276.0 lb    06/24/22 8:49 AM  276.2 lb    06/24/22 8:48 AM  273.4 lb    06/23/22 8:48 AM  276.8 lb    06/22/22 8:47 AM  273.2 lb    06/21/22 8:48 AM  273.6 Lb

## 2022-07-26 ENCOUNTER — Other Ambulatory Visit: Payer: Self-pay

## 2022-07-26 DIAGNOSIS — R600 Localized edema: Secondary | ICD-10-CM

## 2022-07-26 MED ORDER — FUROSEMIDE 40 MG PO TABS: 40.0000 mg | ORAL_TABLET | Freq: Every day | ORAL | 3 refills | Status: DC

## 2022-12-28 ENCOUNTER — Ambulatory Visit: Payer: Medicare HMO | Admitting: Cardiology

## 2023-01-06 ENCOUNTER — Ambulatory Visit: Payer: Medicare HMO | Admitting: Cardiology

## 2023-03-22 ENCOUNTER — Telehealth: Payer: Self-pay | Admitting: Pharmacist

## 2023-03-24 NOTE — Telephone Encounter (Signed)
Opened in error

## 2023-04-10 NOTE — Progress Notes (Signed)
 HPI   Dave Brown presents today for complete physical exam. SAWV and medication check  Health maintenance reviewed and updated.  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 --his weight has decreased he lost 8 pounds since October visit  Wt Readings from Last 3 Encounters:  04/10/23 127 kg (280 lb 6.4 oz)  07/20/22 131 kg (288 lb)  05/03/22 128 kg (281 lb 8.4 oz)      CKD Severe stage 4 currently -patient has been asked many times to see Nephrology and went once 3/24 no follow up yet  - his issues worsened when he was in the hospital in January with Covid - last GFR was in the 20's - he is currently on HCTZ - he really does not want to see a specialist-PATIENT states that he had untreated hypertension for years when he was younger   Lab Results  Component Value Date   CREATININE 2.59 (H) 04/07/2022   BUN 38 (H) 04/07/2022   NA 140 04/07/2022   K 4.1 04/07/2022   CL 102 04/07/2022   CO2 32 (H) 04/07/2022    Hypertension  This is a chronic problem. The current episode started more than 1 year ago. The problem has been waxing and waning since onset. The problem is controlled. Risk factors for coronary artery disease include diabetes mellitus, dyslipidemia, obesity and male gender. Past treatments include beta blockers, diuretics, ACE inhibitors and calcium  channel blockers. The current treatment provides significant improvement. There are no compliance problems. BP Readings from Last 3 Encounters:  04/10/23 102/62  07/20/22 103/62  05/03/22 109/75           Hyperlipidemia  This is a chronic problem. The current episode started more than 1 year ago. The problem is controlled. Recent lipid tests were reviewed and are normal. Exacerbating diseases include diabetes. There are no known factors aggravating his hyperlipidemia. He is currently on no antihyperlipidemic treatment. The current treatment  provides significant improvement of lipids. There are no compliance problems.  Risk factors for coronary artery disease include dyslipidemia, hypertension, male sex and obesity.  Lab Results  Component Value Date   CHOL 148 04/07/2022   CHOL 150 02/22/2021   CHOL 166 08/18/2020   Lab Results  Component Value Date   HDL 33 (L) 04/07/2022   HDL 38 02/22/2021   HDL 38 08/18/2020   No results found for: [facility] Lab Results  Component Value Date   TRIG 137 04/07/2022   TRIG 134 02/22/2021   TRIG 145 08/18/2020   No results found for: CHOLHDL  Elevated blood sugar/borderline - patient has a past history of elevated blood sugar - patient was diagnosed in the past with Diabetes but he does not think he has Diabetes - no chest pain- patient's blood sugars had improved   Lab Results  Component Value Date   HGBA1C 6.5 (H) 04/07/2022        Panic or Anxiety- patient states he gets occasional panic attacks when he is in a small space - he does get short of breath during that time -    SLeep Apnea- patient has a Cpap at home but he feels that no one is monitoring this issue - he states he feels raising the head of his bead has been more helpfl    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 Left ventricle cavity  is normal in size. Moderate concentric hypertrophy of the left ventricle.  Normal global wall motion. Doppler evidence of grade I (impaired)  diastolic dysfunction, normal LAP. Calculated EF 60%.  Structurally normal tricuspid valve with trace regurgitation. No evidence  of pulmonary hypertension.  No significant change compared to 12/2010.     Review of Systems   12 point systems were reviewed and negative except for as noted above.  Medical History   @[email or web address] @ Past Medical History:  Diagnosis Date  . Hearing loss   . Hypercholesterolemia   . Hypertension   . Hypokalemia   . Obesity   . Osteoarthritis   . Sleep apnea    Past Surgical History:  Procedure Laterality Date  . BLADDER STONE REMOVAL     Procedure: BLADDER STONE REMOVAL   Family History  Problem Relation Name Age of Onset  . Cancer Mother     Social History   Social History Narrative  . Not on file   No Known Allergies  Immunization History  Administered Date(s) Administered  . Tdap 08/11/2010    Physical Exam   Vitals:   04/10/23 1123  BP: 102/62  Pulse: 67  Resp: 18  Temp: 98 F (36.7 C)  SpO2: 97%   General Appearance:    Alert, cooperative, no distress, appears stated age  Head:    Normocephalic, without obvious abnormality, atraumatic  Eyes:    conjunctiva/corneas clear,  Ears:    Normal TM's and external ear canals, both ears  Nose:   septum midline,   Throat:   Lips, mucosa, and tongue normal; teeth and gums normal  Neck:   Supple, symmetrical, trachea midline, no adenopathy;       thyroid:  No enlargement/tenderness/nodules;   Back:     Symmetric,   Lungs:     Clear to auscultation bilaterally, no wheezes or   crackles,      respirations unlabored     Heart:    Regular rate and rhythm, S1 and S2 normal, no murmur, rub   or gallop  Abdomen:     Soft, non-tender,    no masses, no organomegaly  Extremities:   Extremities normal, atraumatic, no cyanosis or edema     Skin:   Skin color, texture, turgor normal, no rashes or lesions  Lymph nodes:   Cervical, nodes normal  Neurologic:  GU:   Normal  appearing strength, At baseline. No focal deficit.     I have personally spent 30 minutes involved in face-to-face and non-face-to-face activities for this patient on the day of the visit.  Professional time spent  includes the following activities, in addition to those noted in the documentation:   Assessment/Plan    1. Medicare annual wellness visit, subsequent (Primary)   2. Annual physical exam   3. Hypercholesterolemia - Lipid Panel  4. Vitamin D deficiency, unspecified  - Vitamin D, 25-Hydroxy  5. Chronic kidney disease, stage 4 (severe) (CMD)  - CBC with Differential - Comprehensive Metabolic Panel - Hemoglobin A1C With Estimated Average Glucose  6. Encounter for Medicare annual wellness exam   Outpatient Encounter Medications as of 04/10/2023  Medication Sig Dispense Refill  . furosemide  (LASIX ) 40 mg tablet Take 40 mg by mouth daily.    SABRA glucose blood (Precision Q-I-D Test) test strip Please check blood sugars twice a day and as needed 100 strip 3  . ipratropium-albuteroL  (Combivent  Respimat) 20-100 mcg/actuation inhaler INHALE 1 PUFF INTO THE LUNGS FOUR TIMES DAILY 12 g 0  . lisinopriL (PRINIVIL) 10 mg tablet TAKE 1 TABLET EVERY DAY 90 tablet 0  . metoprolol  succinate (TOPROL  XL) 50 mg 24 hr tablet TAKE 1 TABLET EVERY DAY 90 tablet 0  . rosuvastatin  (CRESTOR ) 10 mg tablet TAKE 1 TABLET EVERY DAY 90 tablet 0  . sertraline  (ZOLOFT ) 25 mg tablet TAKE 1 TABLET EVERY DAY 90 tablet 0  . spironolactone (ALDACTONE) 25 mg tablet TAKE 1 TABLET EVERY DAY (STOP TAKING HYDROCHLOROTHIAZIDE) (NEEDS APPOINTMENT IN NOVEMBER) 90 tablet 3  . [DISCONTINUED] cholecalciferol (VITAMIN D3) 1,250 mcg (50,000 unit) capsule Take one pill every 7 days (weekly) (Patient not taking: Reported on 04/10/2023) 12 capsule 1   No facility-administered encounter medications on file as of 04/10/2023.  Will check all blood work and call and message with results.  Congestive heart failure-you are due to follow-up with your cardiologist his office is now part of [facility] I will give you his phone number please call and make a follow-up appointment. Their phone number is [phone].  Chronic kidney  disease-you are due to follow-up with nephrology as well that provider wanted to see you in September please make a follow-up appointment as well I will check kidney functions today.  MGUS-I reviewed your oncology note you were supposed to follow-up with blood work around June or July 2024 again please call your oncologist office-Dr [facility] for a follow-up appointment and testing requested.  Will check all blood work today; medications refilled.  Please call with any additional questions or concerns.  Age appropriate wellness instructions reviewed and provided in AVS. Risks, benefits, and alternatives of the medications and treatment plan prescribed today were discussed, and patient expressed understanding. Plan follow-up as discussed or as needed if any worsening symptoms or change in condition.  Medication list given to patient. No barriers to learning identified.  Electronically signed by: Kristen Diane Kaplan, PA-C 04/10/2023 11:53 AM  [facility]  - [facility] Medicare Wellness Visit Type:: Subsequent Annual Wellness Visit  Name: Dave Brown Date of Birth: [birth year] Age: 77 y.o. MRN: [id number] Visit Date: 04/10/2023  History obtained from: patient  Living Arrangements/Support System/Health Assessment/Pain/Stress Marital status: widowed Number of children: 4 Occupation: retired Living arrangements: other (son lives with patient.) Does the patient have a support system (family, friend, church, Conservation officer, nature, Catering manager)?: Yes Patient rates overall health status as: (Patient-Rptd) (P) good Do you have any dental concerns?: No In the past month, have you experienced a change in your bladder control?: No   Do you have any difficulty obtaining your medications?: No   Do you have trouble consistently taking or remembering to take all of your medications as prescribed?: No Patient rates overall stress level as: None Does stress affect daily life?:  No Typical amount of pain: some Does pain affect daily life?: No Are you currently prescribed opioids?: No                Depression Screening Today's PHQ-2 results: Patient Health Questionnaire-2 Score: 0 (04/10/23 1127)  Today's PHQ-9 result:    PHQ-9 Question # 9    Interpretation: PHQ-2 Interpretation: Negative (  None-minimal Depression Severity) (04/10/23 1127)    Depression Plan: Normal/Negative Screening  Social History (Tobacco/Drugs/Sexual Activity) Corbitt reports that he has quit smoking. He has never been exposed to tobacco smoke. He has never used smokeless tobacco. Tobacco Use?: No How many times in the past year have you used a recreational drug or used a prescription medication for nonmedical reasons?: None Risk factors for sexually transmitted infections (i.e., multiple sexual partners): (Patient-Rptd) (P) No Are you bothered by sexual problems?: (Patient-Rptd) (P) No  Alcohol Screening How often do you have a drink containing alcohol?: Never How many standard drinks containing alcohol do you have on a typical day?: Never, 1 or 2 drinks How often do you have six or more drinks on one occasion?: Never Audit-C Score: 0  Physical Activity Regular exercise?: (!) No      Diet How many meals a day?: 2 Eats fruit and vegetables daily?: Yes Most meals are obtained by: preparing their own meals, eating out, having others provide food  Home and Transportation Safety All rugs have non-skid backing?: Yes All stairs or steps have railings?: Yes Grab bars in the bathtub or shower?: Yes Have non-skid surface in bathtub or shower?: Yes Good home lighting?: Yes Regular seat belt use?: Yes      Activities of Daily Living Feed self?: Yes Bathe self?: Yes Dress self?: Yes Use toilet without assistance?: Yes Walk without assistance?: Yes    Instrumental Activities of Daily Living Manage finances?: Yes Shop for themselves?: Yes Prepare meals?: Yes Use  the telephone?: Yes Manage medications?: Yes   Performs basic housework/laundry?: Yes Drives?: Yes Primary transportation is: driving  Hearing Concerns about hearing?: (!) Yes Uses hearing aids?: (!) Yes Hear whispered voice? (Observed): (!) No  Vision Concerns about vision?: No Vision exam performed?: (!) No  Fall Risk Is the patient ambulatory?: Yes One or more falls in the last year:: No Feels unsteady when walking:: No  Cognitive Assessment   Are there any memory concerns by the patient, others, or providers?: No              Advance Directives Living will?: (!) No   Healthcare POA?: (!) No       Who is your in case of emergency contact?: (Patient-Rptd) (P) [name] humbles   Emergency contact's phone number: (Patient-Rptd) EL[place]) [phone]   Past Medical History:  Diagnosis Date  . Hearing loss   . Hypercholesterolemia   . Hypertension   . Hypokalemia   . Obesity   . Osteoarthritis   . Sleep apnea    Family History  Problem Relation Name Age of Onset  . Cancer Mother     Social History   Socioeconomic History  . Marital status: Married    Spouse name: None  . Number of children: None  . Years of education: None  . Highest education level: None  Occupational History  . None  Tobacco Use  . Smoking status: Former    Passive exposure: Never  . Smokeless tobacco: Never  Substance and Sexual Activity  . Alcohol use: No  . Drug use: No  . Sexual activity: None  Other Topics Concern  . None  Social History Narrative  . None   Social Drivers of Health   Food Insecurity: Low Risk  (04/09/2023)   Food vital sign   . Within the past 12 months, you worried that your food would run out before you got money to buy more: Never true   . Within the  past 12 months, the food you bought just didn't last and you didn't have money to get more: Never true  Transportation Needs: No Transportation Needs (04/09/2023)   Transportation   . In the past 12  months, has lack of reliable transportation kept you from medical appointments, meetings, work or from getting things needed for daily living? : No  Safety: Not on file  Living Situation: Low Risk  (04/09/2023)   Living Situation   . What is your living situation today?: I have a steady place to live   . Think about the place you live. Do you have problems with any of the following? Choose all that apply:: None/None on this list   Past Surgical History:  Procedure Laterality Date  . BLADDER STONE REMOVAL     Procedure: BLADDER STONE REMOVAL    Current Outpatient Medications:  .  furosemide  (LASIX ) 40 mg tablet, Take 40 mg by mouth daily., Disp: , Rfl:  .  glucose blood (Precision Q-I-D Test) test strip, Please check blood sugars twice a day and as needed, Disp: 100 strip, Rfl: 3 .  ipratropium-albuteroL  (Combivent  Respimat) 20-100 mcg/actuation inhaler, INHALE 1 PUFF INTO THE LUNGS FOUR TIMES DAILY, Disp: 12 g, Rfl: 0 .  metoprolol  succinate (TOPROL  XL) 50 mg 24 hr tablet, TAKE 1 TABLET EVERY DAY, Disp: 90 tablet, Rfl: 0 .  spironolactone (ALDACTONE) 25 mg tablet, TAKE 1 TABLET EVERY DAY (STOP TAKING HYDROCHLOROTHIAZIDE) (NEEDS APPOINTMENT IN NOVEMBER), Disp: 90 tablet, Rfl: 3 .  lisinopriL (PRINIVIL) 10 mg tablet, Take 1 tablet (10 mg total) by mouth daily., Disp: 90 tablet, Rfl: 3 .  rosuvastatin  (CRESTOR ) 10 mg tablet, Take 1 tablet (10 mg total) by mouth daily., Disp: 90 tablet, Rfl: 3 .  sertraline  (ZOLOFT ) 25 mg tablet, Take 1 tablet (25 mg total) by mouth daily., Disp: 90 tablet, Rfl: 3 No Known Allergies Patient Active Problem List   Diagnosis Date Noted  . Benign hypertension with chronic kidney disease, stage IV (CMD) 07/20/2022  . Microhematuria 07/20/2022  . Chronic congestive heart failure (HCC) 04/07/2022  . Morbid obesity with BMI of 40.0-44.9, adult (HCC) 02/22/2021  . Stage 4 chronic kidney disease (HCC) 08/18/2020  . Hearing loss 09/30/2015  . Hyperglycemia 09/30/2015   . Hyperlipidemia 09/30/2015  . Hypertension 09/30/2015  . Hypopotassemia 09/30/2015  . Kidney function abnormal 09/30/2015  . Obesity 09/30/2015  . Osteoarthritis 09/30/2015  . Sleep apnea 09/30/2015  . 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     Other History I reviewed and updated the following risk factors and conditions as appropriate:        Vital Signs BP 102/62 (BP Location: Left arm, Patient Position: Sitting)   Pulse 67   Temp 98 F (36.7 C) (Oral)   Resp 18   Ht 1.778 m (5' 10)   Wt 127 kg (280 lb 6.4 oz)   SpO2 97%   BMI 40.23 kg/m   Screening and Immunizations Health Maintenance Status       Date Due Completion Dates   Comprehensive Annual Visit 04/09/2024 04/10/2023, 04/10/2023 (Done)   Medicare Annual Wellness (AWV) Subsequent Visits 04/09/2024 04/10/2023, 04/10/2023   Diabetes Screening 04/09/2024 04/07/2022, 04/07/2022   Depression Screening 04/09/2024 04/10/2023       Immunization History  Administered Date(s) Administered  . Tdap 08/11/2010    Assessment/Plan: Subsequent Annual Wellness Visit: The topics above were reviewed with the patient.  Healthy lifestyle principles reviewed.  Recommendations provided when indicated.  Follow up 1 year  for next wellness visit.  Orders Placed This Encounter  Procedures  . CBC with Differential  . Comprehensive Metabolic Panel  . Hemoglobin A1C With Estimated Average Glucose  . Lipid Panel  . Vitamin D, 25-Hydroxy  . CBC with Differential    New Medications Ordered This Visit  Medications  . lisinopriL (PRINIVIL) 10 mg tablet    Sig: Take 1 tablet (10 mg total) by mouth daily.    Dispense:  90 tablet    Refill:  3  . rosuvastatin  (CRESTOR ) 10 mg tablet    Sig: Take 1 tablet (10 mg total) by mouth daily.    Dispense:  90 tablet    Refill:  3  . sertraline  (ZOLOFT ) 25 mg tablet    Sig: Take 1 tablet (25 mg total) by mouth daily.    Dispense:  90 tablet    Refill:  3     Patient Care Team: Kristen Diane Kaplan, PA-C as PCP - General (Physician Assistant)  Electronically signed by: Josette Loa Aho, PA-C 04/10/2023 12:06 PM

## 2023-08-22 NOTE — Progress Notes (Signed)
 Subjective Patient ID: Dave Brown is a 78 y.o. male.  Chief Complaint  Patient presents with  . Anxiety  . Chronic Kidney Disease  . Diabetes    Glimiperide prescription goes to [facility].  All other prescriptions go to [facility].  . Hyperlipidemia  . Hypertension  . Vitamin D Deficiency  . Hip Pain    Patient complains of left hip pain.    SABRA other    Patient also has a skin lesion on left cheek and a possible cyst on upper back.   History of Present Illness The patient presents for follow-up on diabetes, chronic kidney disease, hyperlipidemia, and hypertension. He is accompanied by his daughter and is also complaining of left hip pain and has a cyst on his left cheek that he would like evaluated.  Diabetes management is the primary reason for the visit. He has been consistently taking glimepiride without adverse effects. Recent blood work, including A1c levels, is being monitored to assess improvement. The A1c was previously at 6.6, and efforts are being made to lower it further.  Chronic kidney disease is being managed by a nephrologist, Dr. Emmalene. The nephrologist advised avoiding NSAIDs and naproxen. No changes were made to the current medication regimen. Blood tests and urine tests were conducted, showing elevated protein levels consistent with kidney disease. A follow-up visit with the nephrologist is scheduled for 08/30/2023.  Left hip pain is reported as intermittent and severe, attributed to arthritis. The pain radiates down the leg but does not interfere significantly with mobility. No orthopedic consultation has been sought, and no x-rays have been done previously. The pain is exacerbated by certain movements but alleviated by adjusting leg position.  A cyst under the right eye and a similar lesion on the back are noted. The cysts are described as sebaceous, with no significant discomfort unless they become red or inflamed.  Fatigue upon standing and walking  is reported, a symptom that has been present for some time.  Cardiology care was discontinued a year ago after a diagnosis of mild heart failure, which was well-managed. Blood pressure monitoring continues.    The following information was reviewed by members of the visit team:  Tobacco  Allergies      Anxiety    Chronic Kidney Disease  Diabetes  Hyperlipidemia  Hypertension Associated symptoms include anxiety.  Hip Pain     Review of Systems  Objective Vitals:   08/22/23 0908  BP: (!) 90/58  Pulse: 70  Resp: 18  Temp: 98.2 F (36.8 C)  TempSrc: Oral  SpO2: 94%  Weight: 129 kg (283 lb 6.4 oz)  Height: 1.778 m (5' 10)    Physical Exam Vitals and nursing note reviewed.  Constitutional:      General: He is not in acute distress.    Appearance: Normal appearance.  Eyes:     General: No scleral icterus.    Conjunctiva/sclera: Conjunctivae normal.  Musculoskeletal:     Left hip: Tenderness (left gluteal tenderness) present. Normal range of motion.     Comments: Left hip rotation intact   Skin:    Comments: Small sebaceous cyst on right cheek - no erythema Raised non erythematous large sebaceous cyst mid back - no erythema   Neurological:     Mental Status: He is alert.  Psychiatric:        Mood and Affect: Mood normal.        Behavior: Behavior normal.        Thought Content: Thought  content normal.    I have personally spent 32 minutes involved in face-to-face and non-face-to-face activities for this patient on the day of the visit.  Professional time spent includes the following activities, in addition to those noted in the documentation:  - preparing to see the patient (e.g., review of recent and/or remote lab/imaging/study results, provider notes, and patient messages/phone calls available in current EMR, CareEverywhere, and scanned records) -obtaining and/or reviewing separately obtained history either through past provider notes, patient phone  calls, and/or patient's family member(s)/caregiver(s) -performing a medically appropriate examination and/or evaluation -counseling and educating the patient/family/caregiver -ordering medications, tests, or procedures -documenting clinical information in the electronic or other health record -reviewing most up to date studies or expert consensus guidelines for screening/diagnosing/treating pertinent conditions/symptoms -independently interpreting results (not separately reported) and communicating results to the patient/family/caregiver -care coordination (not separately reported) -referring and communicating with other health care professionals (when not separately reported)     Assessment/Plan Diagnoses and all orders for this visit:  Type 2 diabetes mellitus with stage 4 chronic kidney disease, without long-term current use of insulin     (CMD) -     Albumin, Random Urine; Future -     Albumin, Random Urine -     Hemoglobin A1C With Estimated Average Glucose  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 Diabetes Mellitus. - His A1c levels have been borderline for an extended period, necessitating close monitoring. The goal is to achieve an A1c level closer to 6. His current A1c is 6.6. - Blood work will be conducted today to reassess his A1c levels. - He will continue his glimepiride regimen. - Discussed the importance of controlling diabetes to improve overall health.  2.  Chronic Kidney Disease Stage IV. - He is under the care of a nephrologist, Dr. Emmalene, who has advised him to avoid NSAIDs and naproxen. - The nephrologist has ordered future blood work to monitor his condition. - He will continue his current medications, including spironolactone and furosemide . - He has a follow-up appointment with the nephrologist on 08/30/2023.  3. Hyperlipidemia. - No changes were made during this visit. - He will continue his current medication regimen. - Reviewed the importance of maintaining lipid levels within target ranges. - Will monitor lipid levels during routine follow-ups.  4. Hypertension. - His blood pressure is slightly low but stable. - He will continue his current medications, including metoprolol . - Discussed the importance of maintaining stable blood pressure. - Will continue to monitor blood pressure during follow-up visits.  5. Left Hip Pain. - The pain could be due to arthritis or chronic ligament wear and tear. - An x-ray of the left hip will be ordered to further investigate  the cause of the pain. - Advised to apply ice and heat alternately to the affected area and perform simple stretches if possible. - If the x-ray reveals significant issues, a referral to an orthopedist will be considered.  6. Sebaceous Cyst on Right cheek  - The cyst appears to be a simple sebaceous cyst. - Advised to monitor the cyst for any changes such as redness, warmth, or enlargement. - No immediate intervention is required unless these symptoms develop. - Discussed the benign nature of sebaceous cysts and when to seek further evaluation.  7. Sebaceous Cyst on Back. - The cyst on his back appears similar to the one on his right eye but larger. - Warm compresses are recommended to help reduce the size of the cyst. - Advised not to poke or press on the cyst to avoid infection. - If the cyst becomes red, hot, or painful, further evaluation will be  necessary.  8. Heart Failure. - He was released from cardiology care a year ago after being diagnosed with mild heart failure, which was well-managed. - Advised to monitor his blood pressure. - Discussed the importance of monitoring for any new symptoms of heart failure. - Will continue to monitor heart health during routine follow-ups.  9. Health Maintenance. - He is overdue for a tetanus vaccine, last received in 2012. - Advised to get the tetanus vaccine at his local pharmacy, as it is covered 100% by Medicare. - Discussed the importance of staying up-to-date with vaccinations. - Encouraged to consider other recommended vaccines such as pneumonia and shingles.   Electronically signed: Kristen Diane Kaplan, PA-C 08/22/2023  9:34 AM

## 2023-09-20 ENCOUNTER — Other Ambulatory Visit: Payer: Self-pay | Admitting: Cardiology

## 2023-09-20 DIAGNOSIS — R600 Localized edema: Secondary | ICD-10-CM

## 2023-11-30 NOTE — Progress Notes (Signed)
 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 Initially Lost to follow-up since initial office visit March, 2024.  Referred back by PCP. Patient is here with his granddaughter. CKD dates back several years to 2013, current baseline creatinine 2.5-2.6 with eGFR 62ml/min. Has been seen by [facility] nephrology group in the past but lost to follow-up for several years. Was told his major risk factor for kidney disease was his blood pressure.  Reviewed recent labs from [facility] dated 11/28/2023. Most recent BUN/creatinine 50/3.4 compared to 44/3.1 last office visit, eGFR 17 mL/min. His electrolytes are within normal limits. Most recent hemoglobin stable at 13.9. PTH 106, vitamin D 44. Most recent hemoglobin A1c trending down to 5.6%.  Last UPCR-557 mg/g.  Recent urine ACR 61 mg/g creatinine. He is on maintenance spironolactone and furosemide .  No significant lower extremity edema.  Weight down about 7 pounds since last office visit. Blood pressure low normal, asymptomatic.On metoprolol , lisinopril. Denies any uremic or lower urinary tract symptoms.   Review of Systems A complete ROS was performed with pertinent positives and negatives noted in the HPI. The remainder of the ROS are negative.  Meds Ordered in [facility]  Medication Sig Dispense Refill  . furosemide  (LASIX ) 40 mg tablet Take 1 tablet (40 mg total) by mouth daily. 90 tablet 1  . glimepiride (AMARYL) 1 mg tablet TAKE 1 TABLET BY MOUTH IN THE MORNING BEFORE BREAKFAST 30 tablet 0  . glucose blood (Precision Q-I-D Test) test strip Please check blood sugars twice a day and as needed 100 strip 3  . ipratropium-albuteroL  (Combivent   Respimat) 20-100 mcg/actuation inhaler INHALE 1 PUFF INTO THE LUNGS FOUR TIMES DAILY 12 g 0  . lisinopriL (PRINIVIL) 10 mg tablet Take 1 tablet (10 mg total) by mouth daily. 90 tablet 3  . metoprolol  succinate (TOPROL  XL) 50 mg 24 hr tablet TAKE 1 TABLET EVERY DAY 90 tablet 3  . rosuvastatin  (CRESTOR ) 10 mg tablet Take 1 tablet (10 mg total) by mouth daily. 90 tablet 3  . sertraline  (ZOLOFT ) 25 mg tablet Take 1 tablet (25 mg total) by mouth daily. 90 tablet 3   No current Epic-ordered facility-administered medications on file.      Objective:     Vitals:   11/30/23 1549 11/30/23 1550  BP: (!) 94/59 (!) 94/58  Patient Position: Sitting Sitting  Pulse: 69 67  Weight: 127 kg (279 lb)    Wt Readings from Last 3 Encounters:  11/30/23 127 kg (279 lb)  08/22/23 129 kg (283 lb 6.4 oz)  05/22/23 127 kg (281 lb)     Physical Exam: General appearance: alert, appears stated age, cooperative, and no distress Head: normocephalic, atraumatic Eyes: Perrla, EOMI Lungs: Ctab Heart: S1S2 present,regular rate and rhythm, no murmurs Abdomen: is Obese,soft, no tenderness, bs present Extremities: extremities normal, atraumatic, no cyanosis or edema Skin: Skin color, texture, turgor normal. No rashes or lesions Neurologic: alert and oriented times 3,CN I-XII intact, no neurologic deficits   Results for orders placed or performed in visit on 08/22/23  Albumin, Random Urine   Collection Time: 08/22/23  9:29 AM  Result Value Ref Range   Albumin, Urine 43 Not Established mg/L   Creatinine, Urine 71 >=20 mg/dL   Albumin/Creatinine Ratio, Urine 61 (H) 0 -  30 mg/g creat  Hemoglobin A1C With Estimated Average Glucose   Collection Time: 08/22/23 10:55 AM  Result Value Ref Range   Hemoglobin A1c 5.6 <5.7 %   Estimated Average Glucose 114 mg/dL    Assessment:   1. Benign hypertension with chronic kidney disease, stage IV    (CMD)   2. Microalbuminuria   3. Chronic diastolic congestive heart  failure    (CMD)   4. MGUS (monoclonal gammopathy of unknown significance)     Plan:  -Current GFR down to 17 mL/min with no evidence of volume overload. -Will discontinue spironolactone to allow for increased renal perfusion, possibly GFR increased.  - Continue current dose of furosemide . - Phos, bicarb, PTH, vitamin D, hemoglobin at goal for CKD stage. - Remains on low-dose lisinopril for long-term renal mortality benefit as tolerated. -Continue to monitor home BP readings. -Return to clinic in 4 months.   No orders of the defined types were placed in this encounter.  Orders Placed This Encounter  Procedures  . Vitamin D, 25-Hydroxy    Standing Status:   Future    Expected Date:   03/21/2024    Expiration Date:   11/29/2024    Release to patient::   Immediate  . Urinalysis with Microscopic    Standing Status:   Future    Expected Date:   03/21/2024    Expiration Date:   11/29/2024    Release to patient::   Immediate  . Albumin, Random Urine    Standing Status:   Future    Expected Date:   03/21/2024    Expiration Date:   11/29/2024    Release to patient::   Immediate  . CBC with Differential    Standing Status:   Future    Expected Date:   03/21/2024    Expiration Date:   11/29/2024    Release to patient::   Immediate  . Parathyroid Hormone (PTH), Intact    Standing Status:   Future    Expected Date:   03/21/2024    Expiration Date:   11/29/2024    Release to patient::   Immediate  . Protein, Total, Random Urine    Standing Status:   Future    Expected Date:   03/21/2024    Expiration Date:   11/29/2024    Release to patient::   Immediate  . Renal Function Panel    Standing Status:   Future    Expected Date:   03/21/2024    Expiration Date:   11/29/2024    Release to patient::   Immediate    ADETOYE  [email or web address], MD, FASN 11/30/2023 4:49 PM

## 2024-02-01 ENCOUNTER — Other Ambulatory Visit: Payer: Self-pay

## 2024-02-01 ENCOUNTER — Inpatient Hospital Stay (HOSPITAL_COMMUNITY)
Admission: EM | Admit: 2024-02-01 | Discharge: 2024-02-05 | DRG: 291 | Disposition: A | Discharge status: Home / Self Care | Source: Ambulatory Visit | Attending: Family Medicine | Admitting: Family Medicine

## 2024-02-01 ENCOUNTER — Encounter (HOSPITAL_COMMUNITY): Payer: Self-pay | Admitting: Emergency Medicine

## 2024-02-01 ENCOUNTER — Emergency Department (HOSPITAL_COMMUNITY)

## 2024-02-01 DIAGNOSIS — I2489 Other forms of acute ischemic heart disease: Secondary | ICD-10-CM | POA: Diagnosis present

## 2024-02-01 DIAGNOSIS — I50813 Acute on chronic right heart failure: Principal | ICD-10-CM

## 2024-02-01 DIAGNOSIS — I5031 Acute diastolic (congestive) heart failure: Secondary | ICD-10-CM | POA: Diagnosis present

## 2024-02-01 DIAGNOSIS — N184 Chronic kidney disease, stage 4 (severe): Secondary | ICD-10-CM | POA: Diagnosis present

## 2024-02-01 DIAGNOSIS — Z79899 Other long term (current) drug therapy: Secondary | ICD-10-CM

## 2024-02-01 DIAGNOSIS — E1122 Type 2 diabetes mellitus with diabetic chronic kidney disease: Secondary | ICD-10-CM | POA: Diagnosis present

## 2024-02-01 DIAGNOSIS — D696 Thrombocytopenia, unspecified: Secondary | ICD-10-CM | POA: Diagnosis present

## 2024-02-01 DIAGNOSIS — Z1152 Encounter for screening for COVID-19: Secondary | ICD-10-CM

## 2024-02-01 DIAGNOSIS — Z66 Do not resuscitate: Secondary | ICD-10-CM | POA: Diagnosis present

## 2024-02-01 DIAGNOSIS — E870 Hyperosmolality and hypernatremia: Secondary | ICD-10-CM | POA: Diagnosis present

## 2024-02-01 DIAGNOSIS — Z6841 Body Mass Index (BMI) 40.0 and over, adult: Secondary | ICD-10-CM | POA: Diagnosis not present

## 2024-02-01 DIAGNOSIS — D472 Monoclonal gammopathy: Secondary | ICD-10-CM | POA: Diagnosis present

## 2024-02-01 DIAGNOSIS — I5033 Acute on chronic diastolic (congestive) heart failure: Secondary | ICD-10-CM | POA: Diagnosis present

## 2024-02-01 DIAGNOSIS — G4733 Obstructive sleep apnea (adult) (pediatric): Secondary | ICD-10-CM | POA: Diagnosis present

## 2024-02-01 DIAGNOSIS — E7800 Pure hypercholesterolemia, unspecified: Secondary | ICD-10-CM | POA: Diagnosis present

## 2024-02-01 DIAGNOSIS — I130 Hypertensive heart and chronic kidney disease with heart failure and stage 1 through stage 4 chronic kidney disease, or unspecified chronic kidney disease: Secondary | ICD-10-CM | POA: Diagnosis present

## 2024-02-01 DIAGNOSIS — I5082 Biventricular heart failure: Secondary | ICD-10-CM | POA: Diagnosis present

## 2024-02-01 DIAGNOSIS — R0902 Hypoxemia: Secondary | ICD-10-CM

## 2024-02-01 DIAGNOSIS — Z87891 Personal history of nicotine dependence: Secondary | ICD-10-CM | POA: Diagnosis not present

## 2024-02-01 DIAGNOSIS — D631 Anemia in chronic kidney disease: Secondary | ICD-10-CM | POA: Diagnosis present

## 2024-02-01 DIAGNOSIS — D539 Nutritional anemia, unspecified: Secondary | ICD-10-CM

## 2024-02-01 DIAGNOSIS — J9601 Acute respiratory failure with hypoxia: Secondary | ICD-10-CM

## 2024-02-01 DIAGNOSIS — R001 Bradycardia, unspecified: Secondary | ICD-10-CM | POA: Diagnosis present

## 2024-02-01 DIAGNOSIS — E66813 Obesity, class 3: Secondary | ICD-10-CM | POA: Diagnosis present

## 2024-02-01 DIAGNOSIS — E162 Hypoglycemia, unspecified: Secondary | ICD-10-CM

## 2024-02-01 DIAGNOSIS — E11649 Type 2 diabetes mellitus with hypoglycemia without coma: Secondary | ICD-10-CM | POA: Diagnosis not present

## 2024-02-01 LAB — RESP PANEL BY RT-PCR (RSV, FLU A&B, COVID)  RVPGX2
Influenza A by PCR: NEGATIVE
Influenza B by PCR: NEGATIVE
Resp Syncytial Virus by PCR: NEGATIVE
SARS Coronavirus 2 by RT PCR: NEGATIVE

## 2024-02-01 LAB — BASIC METABOLIC PANEL WITH GFR
Anion gap: 9 (ref 5–15)
BUN: 30 mg/dL — ABNORMAL HIGH (ref 8–23)
CO2: 27 mmol/L (ref 22–32)
Calcium: 9.9 mg/dL (ref 8.9–10.3)
Chloride: 110 mmol/L (ref 98–111)
Creatinine, Ser: 2.55 mg/dL — ABNORMAL HIGH (ref 0.61–1.24)
GFR, Estimated: 25 mL/min — ABNORMAL LOW (ref >60)
Glucose, Bld: 61 mg/dL — ABNORMAL LOW (ref 70–99)
Potassium: 3.9 mmol/L (ref 3.5–5.1)
Sodium: 146 mmol/L — ABNORMAL HIGH (ref 135–145)

## 2024-02-01 LAB — CBC
HCT: 43.4 % (ref 39.0–52.0)
Hemoglobin: 12.7 g/dL — ABNORMAL LOW (ref 13.0–17.0)
MCH: 31.5 pg (ref 26.0–34.0)
MCHC: 29.3 g/dL — ABNORMAL LOW (ref 30.0–36.0)
MCV: 107.7 fL — ABNORMAL HIGH (ref 80.0–100.0)
Platelets: 143 K/uL — ABNORMAL LOW (ref 150–400)
RBC: 4.03 MIL/uL — ABNORMAL LOW (ref 4.22–5.81)
RDW: 14 % (ref 11.5–15.5)
WBC: 8.8 K/uL (ref 4.0–10.5)
nRBC: 0 % (ref 0.0–0.2)

## 2024-02-01 LAB — PRO BRAIN NATRIURETIC PEPTIDE: Pro Brain Natriuretic Peptide: 6941 pg/mL — ABNORMAL HIGH (ref <300.0)

## 2024-02-01 LAB — CBG MONITORING, ED
Glucose-Capillary: 69 mg/dL — ABNORMAL LOW (ref 70–99)
Glucose-Capillary: 74 mg/dL (ref 70–99)

## 2024-02-01 LAB — TROPONIN T, HIGH SENSITIVITY: Troponin T High Sensitivity: 85 ng/L — ABNORMAL HIGH (ref 0–19)

## 2024-02-01 MED ORDER — ACETAMINOPHEN 650 MG RE SUPP: 650.0000 mg | Freq: Four times a day (QID) | RECTAL | Status: DC | PRN

## 2024-02-01 MED ORDER — SERTRALINE HCL 25 MG PO TABS
25.0000 mg | ORAL_TABLET | Freq: Every day | ORAL | Status: DC
  Administered 2024-02-02 – 2024-02-05 (×4): 25 mg via ORAL
  Filled 2024-02-01 (×4): qty 1

## 2024-02-01 MED ORDER — ROSUVASTATIN CALCIUM 10 MG PO TABS
10.0000 mg | ORAL_TABLET | Freq: Every day | ORAL | Status: DC
  Administered 2024-02-02 – 2024-02-05 (×4): 10 mg via ORAL
  Filled 2024-02-01 (×4): qty 1

## 2024-02-01 MED ORDER — FUROSEMIDE 10 MG/ML IJ SOLN
60.0000 mg | Freq: Once | INTRAMUSCULAR | Status: AC
  Administered 2024-02-01: 60 mg via INTRAVENOUS
  Filled 2024-02-01: qty 8

## 2024-02-01 MED ORDER — ENOXAPARIN SODIUM 60 MG/0.6ML IJ SOSY
60.0000 mg | PREFILLED_SYRINGE | INTRAMUSCULAR | Status: DC
  Administered 2024-02-01: 60 mg via SUBCUTANEOUS
  Filled 2024-02-01: qty 0.6

## 2024-02-01 MED ORDER — ACETAMINOPHEN 325 MG PO TABS: 650.0000 mg | ORAL_TABLET | Freq: Four times a day (QID) | ORAL | Status: DC | PRN

## 2024-02-01 MED ORDER — FUROSEMIDE 10 MG/ML IJ SOLN
40.0000 mg | Freq: Two times a day (BID) | INTRAMUSCULAR | Status: DC
  Administered 2024-02-02 – 2024-02-05 (×7): 40 mg via INTRAVENOUS
  Filled 2024-02-01 (×7): qty 4

## 2024-02-01 NOTE — Progress Notes (Signed)
 Subjective Patient ID: Dave Brown is a 78 y.o. male.  Chief Complaint  Patient presents with  . other    Patient has concerns with breathing worsening at night.   History of Present Illness The patient presents for evaluation of shortness of breath. He is accompanied by his son.  He reports experiencing shortness of breath, which is more pronounced during physical activity such as walking. This symptom has been progressively worsening over the past month. He does not experience any chest pain or dizziness. He also reports no leg swelling but mentions a slight increase in abdominal swelling. Despite reducing his food intake, he has not observed any weight loss. He has gained 14 pounds since August 2025. He has not had a recent consultation with his cardiologist and is unsure of the date of his next appointment. He has an adjustable bed that allows him to elevate his head, which provides some relief from his symptoms. He used a CPAP machine for 4 to 5 years but discontinued its use due to lack of benefit. He has been using Combivent  inhaler for the past 1 to 2 years but is uncertain of its effectiveness. He does not take any extra potassium supplements. He does not experience any palpitations or rapid heartbeats. His son notes that he becomes easily short of breath during ambulation. He was diagnosed with heart failure in 06/2023 and was prescribed furosemide  40 mg daily. His nephrologist discontinued his spironolactone in 11/2023 due to CKD stage 4   He has a history of chronic kidney disease and is under the care of a nephrologist, with a follow-up appointment scheduled for 04/2024.  He has a persistent cough that produces phlegm, a symptom he has had for some time. He has not had a recent consultation with his hematologist, Dr. Gatha.  He continues to take metoprolol  for hypertension, lisinopril, and a cholesterol-lowering medication.  He is on glimepiride for diabetes management.     Medical History[1] Family History[2] Social History[3] Surgical History[4] Current Medications[5] Allergies[6] Patient Active Problem List   Diagnosis Date Noted  . Morbid obesity with body mass index (BMI) of 40.0 to 44.9 in adult (CMS/HCC) 08/22/2023  . Microalbuminuria 05/22/2023  . Diabetes mellitus, type II (CMS/HCC) 05/15/2023  . Benign hypertension with chronic kidney disease, stage IV    (CMD) 07/20/2022  . Microhematuria 07/20/2022  . Chronic congestive heart failure (HCC) 04/07/2022  . Morbid obesity with BMI of 40.0-44.9, adult (HCC) 02/22/2021  . Stage 4 chronic kidney disease (HCC) 08/18/2020  . Hearing loss 09/30/2015  . Hyperglycemia 09/30/2015  . Hyperlipidemia 09/30/2015  . Hypertension 09/30/2015  . Hypopotassemia 09/30/2015  . Kidney function abnormal 09/30/2015  . Obesity 09/30/2015  . Osteoarthritis 09/30/2015  . Sleep apnea 09/30/2015  . 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     The following information was reviewed by members of the visit team:  Allergies      HPI  Review of Systems  Objective Vitals:   02/01/24 1343  BP: 148/74  Pulse: 71  Resp: (!) 24  Temp: 98 F (36.7 C)  TempSrc: Oral  SpO2: 94%  Weight: 133 kg (293 lb)  Height: 1.778 m (5' 10)    Physical Exam Vitals and nursing note reviewed. Exam conducted with a chaperone present (son).  Constitutional:      General: He is not in acute distress.    Appearance: Normal appearance.  HENT:     Right Ear: Tympanic membrane normal.  Left Ear: Tympanic membrane normal.     Mouth/Throat:     Mouth: Mucous membranes are moist.  Eyes:     General: No scleral icterus.    Conjunctiva/sclera: Conjunctivae normal.  Neck:     Vascular: No hepatojugular reflux.  Cardiovascular:     Rate and Rhythm: Normal rate and regular rhythm.  Pulmonary:     Breath sounds: Rales (bilateral lower lobe) present. No wheezing.     Comments: Patient is leaning  back in chair to assist in his breathing  Abdominal:     General: Abdomen is protuberant. There is distension.  Musculoskeletal:     Right lower leg: Edema (+1 pitting) present.     Left lower leg: Edema (+1 pitting) present.  Neurological:     Mental Status: He is alert. Mental status is at baseline.   I have personally spent 30 minutes involved in face-to-face and non-face-to-face activities for this patient on the day of the visit.  Professional time spent includes the following activities, in addition to those noted in the documentation:   - preparing to see the patient (e.g., review of recent and/or remote lab/imaging/study results, provider notes, and patient messages/phone calls available in current EMR, CareEverywhere, and scanned records) -obtaining and/or reviewing separately obtained history either through past provider notes, patient phone calls, and/or patient's family member(s)/caregiver(s) -performing a medically appropriate examination and/or evaluation -counseling and educating the patient/family/caregiver -ordering medications, tests, or procedures -documenting clinical information in the electronic or other health record -reviewing most up to date studies or expert consensus guidelines for screening/diagnosing/treating pertinent conditions/symptoms -independently interpreting results (not separately reported) and communicating results to the patient/family/caregiver -care coordination (not separately reported) -referring and communicating with other health care professionals (when not separately reported)      Assessment/Plan Diagnoses and all orders for this visit:  Shortness of breath  Acute on chronic congestive heart failure, unspecified heart failure type  Stage 4 chronic kidney disease    (CMD)  Benign hypertension with chronic kidney disease, stage IV    (CMD)  MGUS (monoclonal gammopathy of unknown significance)  Other orders -     glimepiride (AMARYL)  1 mg tablet; Take 1 tablet (1 mg total) by mouth daily before breakfast.   Problem List Items Addressed This Visit     MGUS (monoclonal gammopathy of unknown significance)   Stage 4 chronic kidney disease (HCC)   Benign hypertension with chronic kidney disease, stage IV    (CMD)   Other Visit Diagnoses       Shortness of breath    -  Primary     Acute on chronic congestive heart failure, unspecified heart failure type       Relevant Medications   glimepiride (AMARYL) 1 mg tablet        Assessment & Plan 1. Progressive congestive heart failure: - Presents with lower extremity pitting edema, changes in lung exam (rales at bases), acute shortness of breath, abdominal distention, and a 14-pound weight gain since 11/2023. - Physical exam findings include rales at lung bases and pitting edema in lower extremities. - Discussed increasing the dosage of furosemide , obtaining blood work, and a chest x-ray as potential outpatient management options. Recommended going directly to the emergency room for further evaluation and treatment due to the severity of symptoms. Advised to inform the hospital about the discontinuation of spironolactone and the subsequent worsening of symptoms for cardiologist consultation. - He is comfortable with the plan to go to the emergency room for  rapid imaging, blood work, and potentially intravenous medications to manage fluid overload.  2. Chronic kidney disease stage IV: - Nephrology stopped his spironolactone, which he feels has increased his shortness of breath. He is due to follow up with nephrology in 04/2024.  3. Medication management: - He is currently taking metoprolol  for blood pressure, lisinopril, a cholesterol pill, and glimepiride for diabetes. He is not taking any extra potassium.  4. Cough: - Persistent cough that produces phlegm, a symptom he has had for some time.     Electronically signed: Kristen Diane Kaplan, PA-C 02/01/2024  1:59  PM        [1] Past Medical History: Diagnosis Date  . Hearing loss   . Hypercholesterolemia   . Hypertension   . Hypokalemia   . Obesity   . Osteoarthritis   . Sleep apnea   [2] Family History Problem Relation Name Age of Onset  . Cancer Mother    [3] Social History Socioeconomic History  . Marital status: Married  Tobacco Use  . Smoking status: Former    Passive exposure: Never  . Smokeless tobacco: Never  Substance and Sexual Activity  . Alcohol use: No  . Drug use: No  . Sexual activity: Not Currently   Social Drivers of Health   Food Insecurity: Low Risk  (04/09/2023)   Food vital sign   . Within the past 12 months, you worried that your food would run out before you got money to buy more: Never true   . Within the past 12 months, the food you bought just didn't last and you didn't have money to get more: Never true  Transportation Needs: No Transportation Needs (04/09/2023)   Transportation   . In the past 12 months, has lack of reliable transportation kept you from medical appointments, meetings, work or from getting things needed for daily living? : No  Living Situation: Low Risk  (04/09/2023)   Living Situation   . What is your living situation today?: I have a steady place to live   . Think about the place you live. Do you have problems with any of the following? Choose all that apply:: None/None on this list  [4] Past Surgical History: Procedure Laterality Date  . BLADDER STONE REMOVAL     Procedure: BLADDER STONE REMOVAL  [5]  Current Outpatient Medications:  .  furosemide  (LASIX ) 40 mg tablet, Take 1 tablet (40 mg total) by mouth daily., Disp: 90 tablet, Rfl: 1 .  ipratropium-albuteroL  (Combivent  Respimat) 20-100 mcg/actuation inhaler, INHALE 1 PUFF INTO THE LUNGS FOUR TIMES DAILY, Disp: 12 g, Rfl: 0 .  lisinopriL (PRINIVIL) 10 mg tablet, Take 1 tablet (10 mg total) by mouth daily., Disp: 90 tablet, Rfl: 3 .  metoprolol  succinate (TOPROL  XL) 50 mg  24 hr tablet, TAKE 1 TABLET EVERY DAY, Disp: 90 tablet, Rfl: 3 .  rosuvastatin  (CRESTOR ) 10 mg tablet, Take 1 tablet (10 mg total) by mouth daily., Disp: 90 tablet, Rfl: 3 .  sertraline  (ZOLOFT ) 25 mg tablet, Take 1 tablet (25 mg total) by mouth daily., Disp: 90 tablet, Rfl: 3 .  glimepiride (AMARYL) 1 mg tablet, Take 1 tablet (1 mg total) by mouth daily before breakfast., Disp: 90 tablet, Rfl: 1 .  glucose blood (Precision Q-I-D Test) test strip, Please check blood sugars twice a day and as needed (Patient not taking: Reported on 02/01/2024), Disp: 100 strip, Rfl: 3 [6] No Known Allergies

## 2024-02-01 NOTE — ED Triage Notes (Signed)
 Patient c/o SOB x 1 month. Patient report worsening SOB and wheezing when moving. Patient report taking inhaler without relief. PCP recommended to be seen for further work up. Patient denies chest pain.

## 2024-02-01 NOTE — H&P (Signed)
 History and Physical    Dave Brown FMW:[id number] DOB: 08-23-1945 DOA: 02/01/2024  PCP: Debrah Josette ORN., PA-C  Patient coming from: Home  Chief Complaint: Shortness of breath  HPI: Dave Brown is a 78 y.o. male with medical history significant of hypertension, hyperlipidemia, CKD stage IV, chronic thrombocytopenia, HFpEF, MGUS, class III obesity (BMI 43.27), OSA presenting with chief complaint of shortness of breath. Spironolactone was stopped by nephrology 2 months ago to allow for increased renal perfusion. He was continued on furosemide  40 mg daily.  Patient is endorsing progressively worsening dyspnea, orthopnea, and cough for the past few weeks despite taking furosemide .  He was using his home inhaler without any improvement of his dyspnea.  Denies chest pain.  No other complaints.  ED Course: Slightly bradycardic with heart rate in the high 50s to low 60s, tachypneic with respiratory rate up to 30, oxygen  saturation 88% on room air and placed on 2 L [place].  Labs notable for hemoglobin 12.7 (previously 14.3 in December 2024), MCV 107.7, platelet count 143k (at baseline), sodium 146, glucose 61, creatinine 2.5 (stable), proBNP 6941, COVID/influenza/RSV PCR negative, initial troponin 85 and repeat pending.  Chest x-ray showing cardiomegaly, likely central vascular congestion, and small bilateral pleural effusions.  EKG showing sinus rhythm with first-degree AV block, LAFB, and no significant change compared to previous EKG from November 2023.  Patient was given IV Lasix  60 mg.   Review of Systems:  Review of Systems  All other systems reviewed and are negative.   Past Medical History:  Diagnosis Date   Degenerative joint disease    High cholesterol    Hypertension    Kidney stones    Obesity    Sleep apnea     History reviewed. No pertinent surgical history.   reports that he quit smoking about 25 years ago. His smoking use included cigars. He does not have any smokeless tobacco  history on file. He reports that he does not drink alcohol and does not use drugs.  No Known Allergies  History reviewed. No pertinent family history.  Prior to Admission medications   Medication Sig Start Date End Date Taking? Authorizing Provider  Cholecalciferol 1.25 MG (50000 UT) capsule Take 50,000 Units by mouth once a week. 04/10/22   [provider]  furosemide  (LASIX ) 40 MG tablet Take 1 tablet (40 mg total) by mouth daily. PT. MUST MAKE AN APPOINTMENT IN ORDER TO RECEIVE FUTURE REFILLS. FIRST ATTEMPT. 09/20/23   [name], Newman PARAS, MD  Ipratropium-Albuterol  (COMBIVENT ) 20-100 MCG/ACT AERS respimat Inhale 1 puff into the lungs 2 (two) times daily. 05/03/20   [name], [name], MD  lisinopril (ZESTRIL) 10 MG tablet Take 10 mg by mouth daily. 01/03/22   [provider]  metoprolol  succinate (TOPROL -XL) 50 MG 24 hr tablet Take 50 mg by mouth daily. 05/27/12   [provider]  rosuvastatin  (CRESTOR ) 10 MG tablet Take 10 mg by mouth daily.    [provider]  sertraline  (ZOLOFT ) 25 MG tablet Take 25 mg by mouth daily. 02/20/20   [provider]  spironolactone (ALDACTONE) 25 MG tablet Take 25 mg by mouth daily. 12/21/21   [provider]    Physical Exam: Vitals:   02/01/24 1515 02/01/24 1530 02/01/24 1733 02/01/24 1811  BP: (!) 145/69 137/77  139/72  Pulse: 61 60  60  Resp: (!) 23 (!) 30  (!) 29  Temp:    98.2 F (36.8 C)  TempSrc:    Oral  SpO2: (!) 88%  94% 95% 98%  Weight:      Height:        Physical Exam Vitals reviewed.  Constitutional:      General: He is not in acute distress. HENT:     Head: Normocephalic and atraumatic.  Eyes:     Extraocular Movements: Extraocular movements intact.  Cardiovascular:     Rate and Rhythm: Normal rate and regular rhythm.     Pulses: Normal pulses.  Pulmonary:     Effort: Pulmonary effort is normal. No respiratory distress.     Breath sounds: Rales present.  Abdominal:      General: Bowel sounds are normal.     Palpations: Abdomen is soft.     Tenderness: There is no abdominal tenderness. There is no guarding.  Musculoskeletal:     Cervical back: Normal range of motion.     Right lower leg: Edema present.     Left lower leg: Edema present.     Comments: 1+ pitting edema of bilateral lower extremities  Skin:    General: Skin is warm and dry.  Neurological:     General: No focal deficit present.     Mental Status: He is alert and oriented to person, place, and time.     Labs on Admission: I have personally reviewed following labs and imaging studies  CBC: Recent Labs  Lab 02/01/24 1532  WBC 8.8  HGB 12.7*  HCT 43.4  MCV 107.7*  PLT 143*   Basic Metabolic Panel: Recent Labs  Lab 02/01/24 1532  NA 146*  K 3.9  CL 110  CO2 27  GLUCOSE 61*  BUN 30*  CREATININE 2.55*  CALCIUM  9.9   GFR: Estimated Creatinine Clearance: 32.3 mL/min (A) (by C-G formula based on SCr of 2.55 mg/dL (H)). Liver Function Tests: No results for input(s): AST, ALT, ALKPHOS, BILITOT, PROT, ALBUMIN in the last 168 hours. No results for input(s): LIPASE, AMYLASE in the last 168 hours. No results for input(s): AMMONIA in the last 168 hours. Coagulation Profile: No results for input(s): INR, PROTIME in the last 168 hours. Cardiac Enzymes: No results for input(s): CKTOTAL, CKMB, CKMBINDEX, TROPONINI in the last 168 hours. BNP (last 3 results) Recent Labs    02/01/24 1532  PROBNP 6,941.0*   HbA1C: No results for input(s): HGBA1C in the last 72 hours. CBG: Recent Labs  Lab 02/01/24 2007 02/01/24 2034  GLUCAP 69* 74   Lipid Profile: No results for input(s): CHOL, HDL, LDLCALC, TRIG, CHOLHDL, LDLDIRECT in the last 72 hours. Thyroid Function Tests: No results for input(s): TSH, T4TOTAL, FREET4, T3FREE, THYROIDAB in the last 72 hours. Anemia Panel: No results for input(s): VITAMINB12, FOLATE, FERRITIN,  TIBC, IRON, RETICCTPCT in the last 72 hours. Urine analysis: No results found for: COLORURINE, APPEARANCEUR, LABSPEC, PHURINE, GLUCOSEU, HGBUR, BILIRUBINUR, KETONESUR, PROTEINUR, UROBILINOGEN, NITRITE, LEUKOCYTESUR  Radiological Exams on Admission: DG Chest 2 View Result Date: 02/01/2024 CLINICAL DATA:  Shortness of breath EXAM: CHEST - 2 VIEW COMPARISON:  May 01, 2020 FINDINGS: Likely central vascular congestion. Likely small bilateral pleural effusions with likely bibasilar subsegmental atelectasis. No pneumothorax. Cardiomegaly.  No acute osseous findings. IMPRESSION: Likely central vascular congestion. Small bilateral pleural effusions. Cardiomegaly. Electronically Signed   By: Michaeline Blanch M.D.   On: 02/01/2024 15:50    Assessment and Plan  Acute hypoxemic respiratory failure secondary to acute on chronic HFpEF Spironolactone was stopped by nephrology 2 months ago to allow for increased renal perfusion. He was continued on furosemide  40 mg daily.  Presenting with volume overload.  proBNP 6941. Chest x-ray showing cardiomegaly, likely central vascular congestion, and small bilateral pleural effusions.  Oxygen  saturation 88% on room air, currently stable on 1.5 L [place].  Last echo done in November 2023 showing EF 55 to 60%, moderate concentric hypertrophy of the left ventricle, grade 1 diastolic dysfunction, and trace tricuspid valve regurgitation.  Continue diuresis with IV Lasix  40 mg twice daily.  Monitor intake and output, daily weights, and renal function.  Low-sodium diet with fluid restriction.  Repeat echocardiogram ordered.  Macrocytic anemia Hemoglobin currently 12.7 and was previously 14.3 in December 2024.  Discussed with the patient and he denies any symptoms of GI bleed or any other bleeding.  Continue to monitor H&H.  Check B12 and folate level.  Borderline hypernatremia Continue to monitor labs.  Hypoglycemia Glucose 61 on initial labs.  No  history of diabetes.  Discussed with the patient and he informed me that he did not eat anything all day today due to having a doctor's appointment this morning and had to get fasting labs.  He was given food to eat and most recent CBG in the 70s.  Continue CBG checks every 4 hours for now.  Elevated troponin Likely due to demand ischemia in the setting of acutely decompensated CHF.  Troponin 85 and EKG without acute ischemic changes.  Patient is not endorsing chest pain.  Trend troponin.  CKD stage IV Creatinine stable, monitor renal function.  Hypertension Currently normotensive.  Continue IV diuresis.  Holding metoprolol  at this time due to slight bradycardia.  Hyperlipidemia Continue Crestor .  Chronic thrombocytopenia Platelet count at baseline, monitor labs.  OSA Patient states he is no longer using CPAP.  Mood disorder Continue Zoloft .  Home medications initiated per patient's reported history.  Awaiting pharmacy med rec for verification.   DVT prophylaxis: Lovenox Code Status: DNR pre-arrest interventions desired (discussed with the patient) Level of care: Progressive Care Unit Admission status: It is my clinical opinion that admission to INPATIENT is reasonable and necessary because of the expectation that this patient will require hospital care that crosses at least 2 midnights to treat this condition based on the medical complexity of the problems presented.  Given the aforementioned information, the predictability of an adverse outcome is felt to be significant.  Editha Ram MD [facility]  If 7PM-7AM, please contact night-coverage [email or web address]  02/01/2024, 8:55 PM

## 2024-02-01 NOTE — ED Provider Notes (Signed)
 [place] EMERGENCY DEPARTMENT AT [facility] Provider Note   CSN: [id number] Arrival date & time: 02/01/24  1446     Patient presents with: Shortness of Breath   Dave Brown is a 78 y.o. male.   This is a pleasant 78 year old male who is here today for 1 month of increasing dyspnea on exertion, orthopnea extremity swelling.  He reportedly has gained 14 pounds over the last month.  He went to his PCP today recommend he go to the ED for further evaluation.  Patient with history of heart failure with preserved ejection fraction, was taken off of his spironolactone a few months ago due to his chronic kidney disease.   Shortness of Breath      Prior to Admission medications   Medication Sig Start Date End Date Taking? Authorizing Provider  Cholecalciferol 1.25 MG (50000 UT) capsule Take 50,000 Units by mouth once a week. 04/10/22   [provider]  furosemide  (LASIX ) 40 MG tablet Take 1 tablet (40 mg total) by mouth daily. PT. MUST MAKE AN APPOINTMENT IN ORDER TO RECEIVE FUTURE REFILLS. FIRST ATTEMPT. 09/20/23   [name], Newman PARAS, MD  Ipratropium-Albuterol  (COMBIVENT ) 20-100 MCG/ACT AERS respimat Inhale 1 puff into the lungs 2 (two) times daily. 05/03/20   [name], [name], MD  lisinopril (ZESTRIL) 10 MG tablet Take 10 mg by mouth daily. 01/03/22   [provider]  metoprolol  succinate (TOPROL -XL) 50 MG 24 hr tablet Take 50 mg by mouth daily. 05/27/12   [provider]  rosuvastatin  (CRESTOR ) 10 MG tablet Take 10 mg by mouth daily.    [provider]  sertraline  (ZOLOFT ) 25 MG tablet Take 25 mg by mouth daily. 02/20/20   [provider]  spironolactone (ALDACTONE) 25 MG tablet Take 25 mg by mouth daily. 12/21/21   [provider]    Allergies: Patient has no known allergies.    Review of Systems  Respiratory:  Positive for shortness of breath.     Updated Vital Signs BP 139/72 (BP Location: Left Arm)   Pulse 60   Temp  98.2 F (36.8 C) (Oral)   Resp (!) 29   Ht 5' 9 (1.753 m)   Wt 132.9 kg   SpO2 98%   BMI 43.27 kg/m   Physical Exam Vitals and nursing note reviewed.  Pulmonary:     Comments: Mild tachypnea, diminished lung sounds at the bases. Musculoskeletal:     Right lower leg: Edema present.     Left lower leg: Edema present.  Neurological:     General: No focal deficit present.     Mental Status: He is alert.     (all labs ordered are listed, but only abnormal results are displayed) Labs Reviewed  BASIC METABOLIC PANEL WITH GFR - Abnormal; Notable for the following components:      Result Value   Sodium 146 (*)    Glucose, Bld 61 (*)    BUN 30 (*)    Creatinine, Ser 2.55 (*)    GFR, Estimated 25 (*)    All other components within normal limits  CBC - Abnormal; Notable for the following components:   RBC 4.03 (*)    Hemoglobin 12.7 (*)    MCV 107.7 (*)    MCHC 29.3 (*)    Platelets 143 (*)    All other components within normal limits  PRO BRAIN NATRIURETIC PEPTIDE - Abnormal; Notable for the following components:   Pro Brain Natriuretic Peptide 6,941.0 (*)  All other components within normal limits  TROPONIN T, HIGH SENSITIVITY - Abnormal; Notable for the following components:   Troponin T High Sensitivity 85 (*)    All other components within normal limits  RESP PANEL BY RT-PCR (RSV, FLU A&B, COVID)  RVPGX2  TROPONIN T, HIGH SENSITIVITY    EKG: EKG Interpretation Date/Time:  Thursday February 01 2024 15:11:02 EDT Ventricular Rate:  58 PR Interval:  241 QRS Duration:  123 QT Interval:  486 QTC Calculation: 478 R Axis:   -66  Text Interpretation: Sinus rhythm Prolonged PR interval Nonspecific IVCD with LAD Nonspecific T abnormalities, lateral leads Confirmed by Mannie Pac [phone]) on 02/01/2024 5:32:35 PM  Radiology: DG Chest 2 View Result Date: 02/01/2024 CLINICAL DATA:  Shortness of breath EXAM: CHEST - 2 VIEW COMPARISON:  May 01, 2020 FINDINGS: Likely  central vascular congestion. Likely small bilateral pleural effusions with likely bibasilar subsegmental atelectasis. No pneumothorax. Cardiomegaly.  No acute osseous findings. IMPRESSION: Likely central vascular congestion. Small bilateral pleural effusions. Cardiomegaly. Electronically Signed   By: Michaeline Blanch M.D.   On: 02/01/2024 15:50     .Critical Care  Performed by: Mannie Pac DASEN, DO Authorized by: Mannie Pac DASEN, DO   Critical care provider statement:    Critical care time (minutes):  33   Critical care was necessary to treat or prevent imminent or life-threatening deterioration of the following conditions:  Respiratory failure    Medications Ordered in the ED  furosemide  (LASIX ) injection 60 mg (60 mg Intravenous Given 02/01/24 1744)                                    Medical Decision Making 78 year old male here today with shortness of breath.  Differential diagnoses include CHF exacerbation, pleural effusion, pulmonary edema, less likely ACS.  Plan-patient with a known history of heart failure with preserved ejection fraction, was taken off of spironolactone a few months ago, likely the cause of his slowly developing fluid overload.  Patient gets short of breath with a simple conversation, unable to ambulate more than a few feet.  Have ordered him some Lasix .  Have placed 1 2 L for comfort.  Will admit patient to hospitalist for heart failure exacerbation.  Reassessment 7:10 PM-my dependent review the patient's chest x-ray does show mild pulmonary edema.  His BNP is 6900.  Troponin mildly elevated at 85, believe this is representative of his heart failure exacerbation.  No chest pain.  Will admit patient to hospitalist.  I reviewed the patient's most recent outpatient PCP and cardiology notes.  Amount and/or Complexity of Data Reviewed Labs: ordered. Radiology: ordered.  Risk Prescription drug management.        Final diagnoses:  Acute on chronic  right-sided heart failure [facility])  Hypoxia    ED Discharge Orders     None          Mannie Pac DASEN, DO 02/01/24 1910

## 2024-02-01 NOTE — ED Notes (Signed)
 Lab contacted and BNP added on.

## 2024-02-02 ENCOUNTER — Inpatient Hospital Stay (HOSPITAL_COMMUNITY)

## 2024-02-02 DIAGNOSIS — I5033 Acute on chronic diastolic (congestive) heart failure: Secondary | ICD-10-CM

## 2024-02-02 DIAGNOSIS — N184 Chronic kidney disease, stage 4 (severe): Secondary | ICD-10-CM | POA: Diagnosis not present

## 2024-02-02 DIAGNOSIS — J9601 Acute respiratory failure with hypoxia: Secondary | ICD-10-CM | POA: Diagnosis not present

## 2024-02-02 LAB — COMPREHENSIVE METABOLIC PANEL WITH GFR
ALT: 10 U/L (ref 0–44)
AST: 17 U/L (ref 15–41)
Albumin: 3.6 g/dL (ref 3.5–5.0)
Alkaline Phosphatase: 76 U/L (ref 38–126)
Anion gap: 8 (ref 5–15)
BUN: 30 mg/dL — ABNORMAL HIGH (ref 8–23)
CO2: 29 mmol/L (ref 22–32)
Calcium: 9.8 mg/dL (ref 8.9–10.3)
Chloride: 108 mmol/L (ref 98–111)
Creatinine, Ser: 2.52 mg/dL — ABNORMAL HIGH (ref 0.61–1.24)
GFR, Estimated: 25 mL/min — ABNORMAL LOW (ref >60)
Glucose, Bld: 76 mg/dL (ref 70–99)
Potassium: 3.9 mmol/L (ref 3.5–5.1)
Sodium: 145 mmol/L (ref 135–145)
Total Bilirubin: 0.4 mg/dL (ref 0.0–1.2)
Total Protein: 6.5 g/dL (ref 6.5–8.1)

## 2024-02-02 LAB — CBC
HCT: 39.8 % (ref 39.0–52.0)
Hemoglobin: 11.8 g/dL — ABNORMAL LOW (ref 13.0–17.0)
MCH: 31.7 pg (ref 26.0–34.0)
MCHC: 29.6 g/dL — ABNORMAL LOW (ref 30.0–36.0)
MCV: 107 fL — ABNORMAL HIGH (ref 80.0–100.0)
Platelets: 118 K/uL — ABNORMAL LOW (ref 150–400)
RBC: 3.72 MIL/uL — ABNORMAL LOW (ref 4.22–5.81)
RDW: 14.1 % (ref 11.5–15.5)
WBC: 7.9 K/uL (ref 4.0–10.5)
nRBC: 0 % (ref 0.0–0.2)

## 2024-02-02 LAB — VITAMIN B12: Vitamin B-12: 201 pg/mL (ref 180–914)

## 2024-02-02 LAB — TROPONIN T, HIGH SENSITIVITY
Troponin T High Sensitivity: 100 ng/L (ref 0–19)
Troponin T High Sensitivity: 98 ng/L — ABNORMAL HIGH (ref 0–19)

## 2024-02-02 LAB — ECHOCARDIOGRAM COMPLETE
Area-P 1/2: 4.21 cm2
Calc EF: 76.4 %
Height: 69 in
MV VTI: 2.52 cm2
S' Lateral: 2.7 cm
Single Plane A2C EF: 74.2 %
Single Plane A4C EF: 75.7 %
Weight: 4663.17 [oz_av]

## 2024-02-02 LAB — GLUCOSE, CAPILLARY
Glucose-Capillary: 145 mg/dL — ABNORMAL HIGH (ref 70–99)
Glucose-Capillary: 69 mg/dL — ABNORMAL LOW (ref 70–99)
Glucose-Capillary: 79 mg/dL (ref 70–99)
Glucose-Capillary: 81 mg/dL (ref 70–99)
Glucose-Capillary: 81 mg/dL (ref 70–99)
Glucose-Capillary: 96 mg/dL (ref 70–99)

## 2024-02-02 LAB — FOLATE: Folate: 14.2 ng/mL (ref >5.9)

## 2024-02-02 MED ORDER — METOPROLOL SUCCINATE ER 50 MG PO TB24: 50.0000 mg | ORAL_TABLET | Freq: Every morning | ORAL | Status: DC

## 2024-02-02 MED ORDER — IPRATROPIUM-ALBUTEROL 0.5-2.5 (3) MG/3ML IN SOLN
3.0000 mL | Freq: Four times a day (QID) | RESPIRATORY_TRACT | Status: DC
  Administered 2024-02-02 – 2024-02-03 (×3): 3 mL via RESPIRATORY_TRACT
  Filled 2024-02-02 (×3): qty 3

## 2024-02-02 MED ORDER — HEPARIN SODIUM (PORCINE) 5000 UNIT/ML IJ SOLN
5000.0000 [IU] | Freq: Three times a day (TID) | INTRAMUSCULAR | Status: DC
  Administered 2024-02-02 – 2024-02-05 (×8): 5000 [IU] via SUBCUTANEOUS
  Filled 2024-02-02 (×8): qty 1

## 2024-02-02 NOTE — Progress Notes (Addendum)
 Hypoglycemic Event  CBG: 69  Treatment: 8 oz juice/soda  Symptoms: None  Follow-up CBG: Time:0748 CBG Result:unknown at this time/Recheck 96  Possible Reasons for Event: Inadequate meal intake/AM meal ordered  Comments/MD notified:Dr. Jadine notified via secure chat    Raynard Faster, RN

## 2024-02-02 NOTE — Progress Notes (Signed)
  Echocardiogram 2D Echocardiogram has been performed.  Dave Brown 02/02/2024, 12:10 PM

## 2024-02-02 NOTE — Evaluation (Signed)
 Occupational Therapy Evaluation Patient Details Name: Dave Brown MRN: [id number] DOB: 09/02/1945 Today's Date: 02/02/2024   History of Present Illness   78 y.o. male presenting with chief complaint of shortness of breath. Dx of CHF, respiratory failure. 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 Daughter lives close by property to provide some assist as well. Currently, patient presents with deficits outlined below (see OT Problem List for details) most significantly DOE/decreased activity tolerance for BADL's and functional mobility. Initiated energy conservation, breathing integration and pacing. OT recommended to patient HHOT services however patient politely declined. Patient requires continued Acute care hospital level OT services to progress safety and functional performance and allow for discharge.       If plan is discharge home, recommend the following:   A little help with walking and/or transfers;A little help with bathing/dressing/bathroom;Assistance with cooking/housework;Help with stairs or ramp for entrance;Assist for transportation     Functional Status Assessment   Patient has had a recent decline in their functional status and demonstrates the ability to make significant improvements in function in a reasonable and predictable amount of time.     Equipment Recommendations   None recommended by OT      Precautions/Restrictions   Precautions Precautions: Fall Recall of Precautions/Restrictions: Intact Restrictions Weight Bearing Restrictions Per Provider Order: No     Mobility Bed Mobility Overal bed mobility: Modified Independent             General bed mobility  comments: HOB up, used rail    Transfers Overall transfer level: Modified independent Equipment used: Rolling walker (2 wheels)                      Balance Overall balance assessment: Modified Independent                                         ADL either performed or assessed with clinical judgement   ADL Overall ADL's : At baseline                                       General ADL Comments: abdominal body habitus limits reach to LE's at baseline, needs education re AE     Vision Baseline Vision/History: 0 No visual deficits;1 Wears glasses              Pertinent Vitals/Pain Pain Assessment Pain Assessment: No/denies pain     Extremity/Trunk Assessment Upper Extremity Assessment Upper Extremity Assessment: Right hand dominant;Overall [facility] for tasks assessed   Lower Extremity Assessment Lower Extremity Assessment: Defer to PT evaluation   Cervical / Trunk Assessment Cervical / Trunk Assessment: Normal;Other exceptions (abdominal increased body habitus)   Communication Communication Communication: Impaired Factors Affecting Communication: Hearing impaired   Cognition Arousal: Alert Behavior During Therapy: WFL for tasks assessed/performed Cognition: No apparent impairments             OT - Cognition Comments: mild decreased insight into deficits                 Following  commands: Intact       Cueing  General Comments   Cueing Techniques: Verbal cues;Tactile cues  DOE with light activity, Sats on %RA 97%, educated on IS use and patient able to teach back wiht min cues for 10 reps, initiated ECT education for pacing and breathing education           Home Living Family/patient expects to be discharged to:: Private residence Living Arrangements: Children Available Help at Discharge: Family;Available PRN/intermittently Type of Home: House Home Access: Ramped entrance     Home Layout: One level      Bathroom Shower/Tub: Walk-in shower;Door   [employer] Toilet: Handicapped height Bathroom Accessibility: Yes How Accessible: Accessible via walker Home Equipment: Agricultural consultant (2 wheels);Rollator (4 wheels);Wheelchair - manual;Cane - single point;Lift chair;Adaptive equipment;Hand held shower head;Shower seat - built in [employer]: Educational psychologist Comments: lives with son and daughter in law who work      Prior Functioning/Environment Prior Level of Function : Independent/Modified Independent             Mobility Comments: walks without AD, denies falls in 6 months, states he doesn't go out much ADLs Comments: indep, son cooks most meals but patient does complete weekend breakfast prep    OT Problem List: Obesity;Cardiopulmonary status limiting activity;Decreased knowledge of use of DME or AE;Decreased activity tolerance   OT Treatment/Interventions: Self-care/ADL training;Therapeutic exercise;Energy conservation;Therapeutic activities;Patient/family education      OT Goals(Current goals can be found in the care plan section)   Acute Rehab OT Goals Patient Stated Goal: to get home to my son OT Goal Formulation: With patient Time For Goal Achievement: 02/16/24 Potential to Achieve Goals: Good ADL Goals Pt Will Perform Lower Body Bathing: with modified independence;sit to/from stand Pt Will Perform Lower Body Dressing: with modified independence;sit to/from stand Pt Will Transfer to Toilet: with modified independence;regular height toilet;ambulating Pt Will Perform Toileting - Clothing Manipulation and hygiene: with modified independence;sit to/from stand Pt Will Perform Tub/Shower Transfer: Shower transfer;ambulating;shower seat;with modified independence Pt/caregiver will Perform Home Exercise Program: With written HEP provided;Independently Additional ADL Goal #1: Patient will teach back ECT/5 P's indep for A/IADL's   OT Frequency:  Min 1X/week     AM-PAC OT 6 Clicks Daily Activity     Outcome Measure Help from another person eating meals?: None Help from another person taking care of personal grooming?: None Help from another person toileting, which includes using toliet, bedpan, or urinal?: None Help from another person bathing (including washing, rinsing, drying)?: None Help from another person to put on and taking off regular upper body clothing?: None Help from another person to put on and taking off regular lower body clothing?: None 6 Click Score: 24   End of Session Equipment Utilized During Treatment: Gait belt Nurse Communication: Mobility status;Other (comment) (asked if IS training was allowed and nursing ok'd)  Activity Tolerance: Other (comment) (DOE) Patient left: in chair;with call bell/phone within reach;with chair alarm set  OT Visit Diagnosis: Unsteadiness on feet (R26.81)                Time: 8561-8544 OT Time Calculation (min): 17 min Charges:  OT General Charges $OT Visit: 1 Visit OT Evaluation $OT Eval Low Complexity: 1 Low Cabell Lazenby OT/L Acute Rehabilitation Department  [phone]  02/02/2024, 3:22 PM

## 2024-02-02 NOTE — Plan of Care (Signed)
  Problem: Education: Goal: Knowledge of General Education information will improve Description: Including pain rating scale, medication(s)/side effects and non-pharmacologic comfort measures Outcome: Progressing   Problem: Health Behavior/Discharge Planning: Goal: Ability to manage health-related needs will improve Outcome: Progressing   Problem: Clinical Measurements: Goal: Diagnostic test results will improve Outcome: Progressing   Problem: Coping: Goal: Level of anxiety will decrease Outcome: Progressing   Problem: Elimination: Goal: Will not experience complications related to urinary retention Outcome: Progressing   Problem: Skin Integrity: Goal: Risk for impaired skin integrity will decrease Outcome: Progressing   Problem: Education: Goal: Ability to demonstrate management of disease process will improve Outcome: Progressing Goal: Ability to verbalize understanding of medication therapies will improve Outcome: Progressing   Problem: Activity: Goal: Capacity to carry out activities will improve Outcome: Progressing   Problem: Cardiac: Goal: Ability to achieve and maintain adequate cardiopulmonary perfusion will improve Outcome: Progressing

## 2024-02-02 NOTE — TOC Initial Note (Signed)
 Transition of Care [facility]) - Initial/Assessment Note   Patient Details  Name: Dave Brown MRN: [id number] Date of Birth: 1945-04-29  Transition [facility]) CM/SW Contact:    Duwaine GORMAN Aran, LCSW Phone Number: 02/02/2024, 2:06 PM  Clinical Narrative: Care management consulted for heart failure screening. Patient has not had any ED visits and one admission in the past 6 months. Heart failure navigation team consulted, so consult will be cleared at this time. PT evaluation recommended HHPT. CSW met with patient to discuss recommendations. Patient politely declined HH services as he does not think he needs that at this time. Patient reported he already has DME at home: walker, cane, left chair, and ramp.  Expected Discharge Plan: Home/Self Care Barriers to Discharge: Continued Medical Work up  Patient Goals and CMS Choice Patient states their goals for this hospitalization and ongoing recovery are:: Return home Choice offered to / list presented to : NA  Expected Discharge Plan and Services In-house Referral: Clinical Social Work Post Acute Care Choice: NA Living arrangements for the past 2 months: Single Family Home           DME Arranged: N/A DME Agency: NA HH Arranged: Patient Refused HH  Prior Living Arrangements/Services Living arrangements for the past 2 months: Single Family Home Lives with:: Self Patient language and need for interpreter reviewed:: Yes Do you feel safe going back to the place where you live?: Yes      Need for Family Participation in Patient Care: No (Comment) Care giver support system in place?: Yes (comment) Current home services: DME (Walker, cane, lift chair, ramp) Criminal Activity/Legal Involvement Pertinent to Current Situation/Hospitalization: No - Comment as needed  Activities of Daily Living ADL Screening (condition at time of admission) Independently performs ADLs?: Yes (appropriate for developmental age) Is the patient deaf or have difficulty  hearing?: Yes Does the patient have difficulty seeing, even when wearing glasses/contacts?: No Does the patient have difficulty concentrating, remembering, or making decisions?: No  Emotional Assessment Appearance:: Appears stated age Attitude/Demeanor/Rapport: Engaged Affect (typically observed): Appropriate Orientation: : Oriented to Self, Oriented to Place, Oriented to  Time, Oriented to Situation Alcohol / Substance Use: Not Applicable Psych Involvement: No (comment)  Admission diagnosis:  Hypoxia [R09.02] Acute on chronic right-sided heart failure (HCC) [I50.813] Acute on chronic heart failure with preserved ejection fraction (HFpEF) (HCC) [I50.33] Patient Active Problem List   Diagnosis Date Noted   Acute on chronic heart failure with preserved ejection fraction (HFpEF) (HCC) 02/01/2024   Acute hypoxemic respiratory failure (HCC) 02/01/2024   Macrocytic anemia 02/01/2024   Hypoglycemia 02/01/2024   Chronic heart failure with preserved ejection fraction (HCC) 03/28/2022   Elevated brain natriuretic peptide (BNP) level 02/23/2022   Leg edema 02/23/2022   AKI (acute kidney injury)    COVID-19    Acute renal failure (ARF) 05/01/2020   Pneumonia due to COVID-19 virus 05/01/2020   Sleep apnea    CKD (chronic kidney disease) stage 4, GFR 15-29 ml/min (HCC)    Hypertension    Degenerative joint disease    High cholesterol    Obesity    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   PCP:  Debrah Josette ORN., PA-C Pharmacy:   [facility] [address] [place] - [address] [place] [address] Phone: [phone] Fax: [phone]  [facility] - [place], [place] - [address] [place] [address] Phone: [phone] Fax: [phone]  Social  Drivers of Health (SDOH) Social History: SDOH Screenings   Food Insecurity: No Food Insecurity (02/01/2024)  Housing: Low Risk   (02/01/2024)  Transportation Needs: No Transportation Needs (02/01/2024)  Utilities: Not At Risk (02/01/2024)  Social Connections: Unknown (02/01/2024)  Tobacco Use: Medium Risk (02/01/2024)   SDOH Interventions:    Readmission Risk Interventions     No data to display

## 2024-02-02 NOTE — Hospital Course (Signed)
 78 year old man PMH CKD stage IV, thrombocytopenia, diastolic CHF, presented with shortness of breath, orthopnea, cough.  Admitted for acute hypoxic respiratory failure secondary to CHF.  Consultants None   Procedures/Events 10/9 admit

## 2024-02-02 NOTE — Evaluation (Signed)
 Physical Therapy Evaluation Patient Details Name: Dave Brown MRN: [id number] DOB: 21-Dec-1945 Today's Date: 02/02/2024  History of Present Illness  78 y.o. male presenting with chief complaint of shortness of breath. Dx of CHF, respiratory failure. 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 Pt ambulated 50' with RW, no loss of balance, SpO2 91-95% on room air walking, RR 44, distance limited by +3/4 dyspnea.  Pt currently with functional limitations due to the deficits listed below (see PT Problem List). Pt will benefit from acute skilled PT to increase their independence and safety with mobility to allow discharge.           If plan is discharge home, recommend the following: Assistance with cooking/housework;Assist for transportation;Help with stairs or ramp for entrance   Can travel by private vehicle        Equipment Recommendations None recommended by PT  Recommendations for Other Services       Functional Status Assessment Patient has had a recent decline in their functional status and demonstrates the ability to make significant improvements in function in a reasonable and predictable amount of time.     Precautions / Restrictions Precautions Precautions: Fall Recall of Precautions/Restrictions: Intact Restrictions Weight Bearing Restrictions Per Provider Order: No      Mobility  Bed Mobility Overal bed mobility: Modified Independent             General bed mobility comments: HOB up, used rail    Transfers Overall transfer level: Modified independent Equipment used: Rolling walker (2 wheels)                    Ambulation/Gait Ambulation/Gait assistance: Supervision Gait Distance (Feet): 50 Feet Assistive device: Rolling walker (2 wheels) Gait Pattern/deviations: Step-through  pattern, Decreased stride length Gait velocity: WFL     General Gait Details: steady, no loss of balance, SpO2 91-95% on room air, RR 44, 3/4 dyspnea  Stairs            Wheelchair Mobility     Tilt Bed    Modified Rankin (Stroke Patients Only)       Balance Overall balance assessment: Modified Independent                                           Pertinent Vitals/Pain Pain Assessment Pain Assessment: No/denies pain    Home Living Family/patient expects to be discharged to:: Private residence Living Arrangements: Children Available Help at Discharge: Family;Available PRN/intermittently Type of Home: House Home Access: Ramped entrance       Home Layout: One level Home Equipment: Agricultural consultant (2 wheels);Rollator (4 wheels);Wheelchair - [employer] - single point Additional Comments: lives with son and daughter in law who work    Prior Function Prior Level of Function : Independent/Modified Independent             Mobility Comments: walks without AD, denies falls in 6 months, states he doesn't go out much       Extremity/Trunk Assessment   Upper Extremity Assessment Upper Extremity Assessment: Overall WFL for tasks assessed    Lower Extremity Assessment Lower Extremity Assessment: Overall WFL for tasks assessed    Cervical / Trunk Assessment Cervical / Trunk Assessment: Normal  Communication   Communication Communication: Impaired  Factors Affecting Communication: Hearing impaired    Cognition Arousal: Alert Behavior During Therapy: WFL for tasks assessed/performed   PT - Cognitive impairments: No apparent impairments                         Following commands: Intact       Cueing Cueing Techniques: Verbal cues, Tactile cues     General Comments      Exercises     Assessment/Plan    PT Assessment Patient needs continued PT services  PT Problem List Cardiopulmonary status limiting activity;Decreased  activity tolerance       PT Treatment Interventions Gait training;Therapeutic exercise;Functional mobility training;Therapeutic activities;Patient/family education    PT Goals (Current goals can be found in the Care Plan section)  Acute Rehab PT Goals Patient Stated Goal: to be able to get out more PT Goal Formulation: With patient Time For Goal Achievement: 02/16/24 Potential to Achieve Goals: Good    Frequency Min 3X/week     Co-evaluation               AM-PAC PT 6 Clicks Mobility  Outcome Measure Help needed turning from your back to your side while in a flat bed without using bedrails?: None Help needed moving from lying on your back to sitting on the side of a flat bed without using bedrails?: A Little Help needed moving to and from a bed to a chair (including a wheelchair)?: None Help needed standing up from a chair using your arms (e.g., wheelchair or bedside chair)?: None Help needed to walk in hospital room?: None Help needed climbing 3-5 steps with a railing? : None 6 Click Score: 23    End of Session Equipment Utilized During Treatment: Gait belt Activity Tolerance: Patient limited by fatigue Patient left: in chair;with call bell/phone within reach;with chair alarm set Nurse Communication: Mobility status PT Visit Diagnosis: Difficulty in walking, not elsewhere classified (R26.2)    Time: 1003-1017 PT Time Calculation (min) (ACUTE ONLY): 14 min   Charges:   PT Evaluation $PT Eval Moderate Complexity: 1 Mod   PT General Charges $$ ACUTE PT VISIT: 1 Visit        Sylvan Delon Copp PT 02/02/2024  Acute Rehabilitation Services  Office [phone]

## 2024-02-02 NOTE — Progress Notes (Signed)
 SATURATION QUALIFICATIONS: (This note is used to comply with regulatory documentation for home oxygen )  Patient Saturations on Room Air at Rest = 93%  Patient Saturations on Room Air while Ambulating = 91-95%   Sylvan Delon Copp PT 02/02/2024  Acute Rehabilitation Services  Office [phone]

## 2024-02-02 NOTE — Progress Notes (Addendum)
  Progress Note   Patient: Dave Brown FMW:[id number] DOB: December 21, 1945 DOA: 02/01/2024     1 DOS: the patient was seen and examined on 02/02/2024   Brief hospital course: 78 year old man PMH CKD stage IV, thrombocytopenia, diastolic CHF, presented with shortness of breath, orthopnea, cough.  Admitted for acute hypoxic respiratory failure secondary to CHF.  Consultants None   Procedures/Events 10/9 admit  Assessment and Plan: Acute hypoxic respiratory failure Acute on chronic diastolic CHF Nephrology previously stopped spironolactone 2D echocardiogram grade 3 diastolic dysfunction Responding to Lasix , continue diuresis, follow BMP, renal function currently stable  Elevated troponin Demand ischemia Mild bradycardia Some component of demand ischemia from shortness of breath and heart failure, troponin also elevated related to CKD No signs or symptoms of ACS, EKG nonacute, no further evaluation suggested Hold beta-blocker  CKD stage IV Creatinine appears to be stable.  Monitor with BMP.  Diabetes mellitus type 2 Hypoglycemia Hypoglycemia may be related to poor oral intake.  Follow clinically. Continue to hold glimepiride  Macrocytic anemia Folate and B12 within normal limits.  Mild.  Follow-up as an outpatient.  Chronic thrombocytopenia Stable  Obesity Body mass index is 43.04 kg/m.        Subjective:  Feels better but still SOB  Physical Exam: Vitals:   02/02/24 0500 02/02/24 0820 02/02/24 1043 02/02/24 1427  BP:  126/67  (!) 95/53  Pulse:  66  69  Resp:    17  Temp:  98.5 F (36.9 C)  98.4 F (36.9 C)  TempSrc:  Oral  Oral  SpO2:  99% 91% 92%  Weight: 132.2 kg     Height:       Physical Exam Vitals reviewed.  Constitutional:      General: He is not in acute distress.    Appearance: He is not ill-appearing or toxic-appearing.  Cardiovascular:     Rate and Rhythm: Normal rate and regular rhythm.     Heart sounds: No murmur heard. Pulmonary:      Effort: Pulmonary effort is normal. No respiratory distress.     Breath sounds: No wheezing, rhonchi or rales.  Musculoskeletal:     Right lower leg: Edema present.     Left lower leg: Edema present.  Neurological:     Mental Status: He is alert.  Psychiatric:        Mood and Affect: Mood normal.        Behavior: Behavior normal.     Data Reviewed: CBG 1 borderline low Creatinine stable 2.52 Troponins stable 85, 100, 98 Platelets stable 118 2D echocardiogram LVEF 60 to 65%.  No wall motion abnormalities.  Grade 3 diastolic dysfunction.  Family Communication: none  Disposition: Status is: Inpatient Remains inpatient appropriate because: CHF     Time spent: 35 minutes  Author: Toribio Door, MD 02/02/2024 5:17 PM  For on call review [email or web address].

## 2024-02-02 NOTE — Plan of Care (Signed)
   Problem: Education: Goal: Knowledge of General Education information will improve Description: Including pain rating scale, medication(s)/side effects and non-pharmacologic comfort measures Outcome: Progressing   Problem: Clinical Measurements: Goal: Ability to maintain clinical measurements within normal limits will improve Outcome: Progressing   Problem: Clinical Measurements: Goal: Respiratory complications will improve Outcome: Progressing   Problem: Clinical Measurements: Goal: Cardiovascular complication will be avoided Outcome: Progressing

## 2024-02-03 DIAGNOSIS — I5033 Acute on chronic diastolic (congestive) heart failure: Secondary | ICD-10-CM | POA: Diagnosis not present

## 2024-02-03 DIAGNOSIS — J9601 Acute respiratory failure with hypoxia: Secondary | ICD-10-CM | POA: Diagnosis not present

## 2024-02-03 DIAGNOSIS — N184 Chronic kidney disease, stage 4 (severe): Secondary | ICD-10-CM | POA: Diagnosis not present

## 2024-02-03 LAB — BASIC METABOLIC PANEL WITH GFR
Anion gap: 8 (ref 5–15)
BUN: 36 mg/dL — ABNORMAL HIGH (ref 8–23)
CO2: 29 mmol/L (ref 22–32)
Calcium: 9.5 mg/dL (ref 8.9–10.3)
Chloride: 105 mmol/L (ref 98–111)
Creatinine, Ser: 2.73 mg/dL — ABNORMAL HIGH (ref 0.61–1.24)
GFR, Estimated: 23 mL/min — ABNORMAL LOW (ref >60)
Glucose, Bld: 103 mg/dL — ABNORMAL HIGH (ref 70–99)
Potassium: 3.8 mmol/L (ref 3.5–5.1)
Sodium: 143 mmol/L (ref 135–145)

## 2024-02-03 LAB — GLUCOSE, CAPILLARY
Glucose-Capillary: 87 mg/dL (ref 70–99)
Glucose-Capillary: 91 mg/dL (ref 70–99)
Glucose-Capillary: 118 mg/dL — ABNORMAL HIGH (ref 70–99)
Glucose-Capillary: 99 mg/dL (ref 70–99)

## 2024-02-03 MED ORDER — ALBUTEROL SULFATE (2.5 MG/3ML) 0.083% IN NEBU: 2.5000 mg | INHALATION_SOLUTION | RESPIRATORY_TRACT | Status: DC | PRN

## 2024-02-03 MED ORDER — IPRATROPIUM-ALBUTEROL 0.5-2.5 (3) MG/3ML IN SOLN
3.0000 mL | Freq: Two times a day (BID) | RESPIRATORY_TRACT | Status: DC
  Administered 2024-02-03: 3 mL via RESPIRATORY_TRACT
  Filled 2024-02-03 (×2): qty 3

## 2024-02-03 NOTE — Progress Notes (Signed)
 SATURATION QUALIFICATIONS: (This note is used to comply with regulatory documentation for home oxygen )  Patient Saturations on Room Air at Rest = 94%  Patient Saturations on Room Air while Ambulating = 84%  Patient Saturations on 2 Liters of oxygen  while Ambulating = 98 %  Please briefly explain why patient needs home oxygen :  Supplemental oxygen  is required to maintain oxygen  saturation level greater than 90%.

## 2024-02-03 NOTE — Plan of Care (Signed)
  Problem: Clinical Measurements: Goal: Respiratory complications will improve Outcome: Progressing   Problem: Clinical Measurements: Goal: Cardiovascular complication will be avoided Outcome: Progressing   Problem: Clinical Measurements: Goal: Diagnostic test results will improve Outcome: Progressing   Problem: Activity: Goal: Risk for activity intolerance will decrease Outcome: Progressing

## 2024-02-03 NOTE — Progress Notes (Addendum)
  Progress Note   Patient: Dave Brown FMW:[id number] DOB: [birth year] DOA: 02/01/2024     2 DOS: the patient was seen and examined on 02/03/2024   Brief hospital course: 78 year old man PMH CKD stage IV, thrombocytopenia, diastolic CHF, presented with shortness of breath, orthopnea, cough.  Admitted for acute hypoxic respiratory failure secondary to CHF.  Consultants None   Procedures/Events 10/9 admit  Assessment and Plan: Acute hypoxic respiratory failure Acute on chronic diastolic CHF Nephrology previously stopped spironolactone 2D echocardiogram grade 3 diastolic dysfunction Clinically responding to Lasix , although output not quantified, will continue diuresis, follow BMP, renal function currently at baseline Will need home oxygen .  DME ordered.   Elevated troponin Demand ischemia Mild bradycardia Some component of demand ischemia from shortness of breath and heart failure, troponin also elevated related to CKD No signs or symptoms of ACS, EKG nonacute, no further evaluation suggested Hold beta-blocker   CKD stage IV Creatinine appears to be at baseline   Diabetes mellitus type 2 Hypoglycemia Hypoglycemia resolved.  Sliding scale insulin . Continue to hold glimepiride   Macrocytic anemia Folate and B12 within normal limits.  Mild.  Follow-up as an outpatient.   Chronic thrombocytopenia Stable   Obesity Body mass index is 43.04 kg/m.  Overall improving, likely home 1 to 2 days.  Continue diuresis.    Subjective:  Feels better  Physical Exam: Vitals:   02/03/24 0717 02/03/24 0937 02/03/24 0938 02/03/24 1354  BP:    123/71  Pulse:    68  Resp:    19  Temp:    98.1 F (36.7 C)  TempSrc:    Oral  SpO2: 98% 94% 94% 98%  Weight:      Height:       Physical Exam Vitals reviewed.  Constitutional:      General: He is not in acute distress.    Appearance: He is not ill-appearing or toxic-appearing.  Cardiovascular:     Rate and Rhythm: Normal rate and  regular rhythm.     Heart sounds: No murmur heard. Pulmonary:     Effort: Pulmonary effort is normal. No respiratory distress.     Breath sounds: No wheezing, rhonchi or rales.  Musculoskeletal:     Right lower leg: Edema present.     Left lower leg: Edema present.  Neurological:     Mental Status: He is alert.  Psychiatric:        Mood and Affect: Mood normal.        Behavior: Behavior normal.     Data Reviewed: Urine output not recorded CBG stable Creatinine baseline around 2.7 possibly  Family Communication: son at bedside  Disposition: Status is: Inpatient Remains inpatient appropriate because: CHF     Time spent: 35 minutes  Author: Toribio Door, MD 02/03/2024 3:21 PM  For on call review [email or web address].

## 2024-02-03 NOTE — Progress Notes (Signed)
 Physical Therapy Treatment Patient Details Name: Dave Brown MRN: [id number] DOB: 09/17/1945 Today's Date: 02/03/2024   History of Present Illness 78 y.o. male presenting with chief complaint of shortness of breath. Dx of CHF, respiratory failure. Pt with medical history significant of hypertension, hyperlipidemia, CKD stage IV, chronic thrombocytopenia, HFpEF, MGUS, class III obesity (BMI 43.27), OSA    PT Comments  Pt ambulated in hallway and maintained 2L O2 [place] (RN reports pt required supplemental oxygen  on screening this morning).  Pt reports feeling mildly better with maintaining 2L and tolerated good distance.  Pt typically does not use oxygen  at baseline and does reports history of falls.  Son very agreeable to HHPT and recommended to pt as well (son reports he has been a little reluctant to accept).  Pt appears more agreeable at this time.  Pt and son anticipate d/c soon.   If plan is discharge home, recommend the following: Assistance with cooking/housework;Assist for transportation;Help with stairs or ramp for entrance   Can travel by private vehicle        Equipment Recommendations  None recommended by PT    Recommendations for Other Services       Precautions / Restrictions Precautions Precautions: Fall Recall of Precautions/Restrictions: Intact Precaution/Restrictions Comments: monitor sats     Mobility  Bed Mobility               General bed mobility comments: pt in recliner    Transfers Overall transfer level: Modified independent Equipment used: None                    Ambulation/Gait Ambulation/Gait assistance: Supervision, Modified independent (Device/Increase time) Gait Distance (Feet): 360 Feet Assistive device: None Gait Pattern/deviations: Step-through pattern, Decreased stride length       General Gait Details: steady, no loss of balance, took a standing rest break halfway, SpO2 97% on 2L O2 [place] (pt ambulated earlier with RN and  required supplemental O2 so maintained 2L and pt reports feeling a little less SOB with this ambulation)   Stairs             Wheelchair Mobility     Tilt Bed    Modified Rankin (Stroke Patients Only)       Balance Overall balance assessment: History of Falls                                          Communication Communication Communication: Impaired Factors Affecting Communication: Hearing impaired  Cognition Arousal: Alert Behavior During Therapy: WFL for tasks assessed/performed   PT - Cognitive impairments: No apparent impairments                         Following commands: Intact      Cueing    Exercises      General Comments        Pertinent Vitals/Pain Pain Assessment Pain Assessment: No/denies pain    Home Living                          Prior Function            PT Goals (current goals can now be found in the care plan section) Progress towards PT goals: Progressing toward goals    Frequency    Min 2X/week  PT Plan      Co-evaluation              AM-PAC PT 6 Clicks Mobility   Outcome Measure  Help needed turning from your back to your side while in a flat bed without using bedrails?: None Help needed moving from lying on your back to sitting on the side of a flat bed without using bedrails?: None Help needed moving to and from a bed to a chair (including a wheelchair)?: None Help needed standing up from a chair using your arms (e.g., wheelchair or bedside chair)?: None Help needed to walk in hospital room?: A Little Help needed climbing 3-5 steps with a railing? : A Little 6 Click Score: 22    End of Session Equipment Utilized During Treatment: Oxygen  Activity Tolerance: Patient tolerated treatment well Patient left: in chair;with call bell/phone within reach;with family/visitor present Nurse Communication: Mobility status PT Visit Diagnosis: Difficulty in walking, not  elsewhere classified (R26.2)     Time: 1025-1036 PT Time Calculation (min) (ACUTE ONLY): 11 min  Charges:    $Gait Training: 8-22 mins PT General Charges $$ ACUTE PT VISIT: 1 Visit                    Tari KLEIN, DPT Physical Therapist Acute Rehabilitation Services Office: [phone]    Tari CROME [place] 02/03/2024, 1:49 PM

## 2024-02-03 NOTE — Progress Notes (Signed)
 Pt O2 sat dropped while sleeping to 87% and pt HR was bradycardic 20's while sleeping during the night, CMT called to alert of the change. Placed O2 at 2l on pt and oxygen  sat increased to the 90's. Pt showed sign of sleep apnea. Pt stated he felt better with the O2 on. Concerned pt states he has a make shift O2 cylinder from a welding tank that he uses for O2 at times. His belief is O2 from the welding tank is the right concentration for breathing, attempting to educate pt and told pt about the resources available to evaluate one for O2 for home use. Told pt the oxygen  in the welding tank may not be pure and appropriate concentration and is not a good idea for breathing into your lungs. Pt receptive and request to be evaluate for home O2. SRP,RN

## 2024-02-04 DIAGNOSIS — I5033 Acute on chronic diastolic (congestive) heart failure: Secondary | ICD-10-CM | POA: Diagnosis not present

## 2024-02-04 DIAGNOSIS — N184 Chronic kidney disease, stage 4 (severe): Secondary | ICD-10-CM | POA: Diagnosis not present

## 2024-02-04 DIAGNOSIS — J9601 Acute respiratory failure with hypoxia: Secondary | ICD-10-CM | POA: Diagnosis not present

## 2024-02-04 LAB — BASIC METABOLIC PANEL WITH GFR
Anion gap: 11 (ref 5–15)
BUN: 39 mg/dL — ABNORMAL HIGH (ref 8–23)
CO2: 28 mmol/L (ref 22–32)
Calcium: 9.4 mg/dL (ref 8.9–10.3)
Chloride: 105 mmol/L (ref 98–111)
Creatinine, Ser: 2.6 mg/dL — ABNORMAL HIGH (ref 0.61–1.24)
GFR, Estimated: 24 mL/min — ABNORMAL LOW (ref >60)
Glucose, Bld: 90 mg/dL (ref 70–99)
Potassium: 3.8 mmol/L (ref 3.5–5.1)
Sodium: 143 mmol/L (ref 135–145)

## 2024-02-04 LAB — GLUCOSE, CAPILLARY
Glucose-Capillary: 88 mg/dL (ref 70–99)
Glucose-Capillary: 85 mg/dL (ref 70–99)
Glucose-Capillary: 91 mg/dL (ref 70–99)
Glucose-Capillary: 116 mg/dL — ABNORMAL HIGH (ref 70–99)
Glucose-Capillary: 113 mg/dL — ABNORMAL HIGH (ref 70–99)

## 2024-02-04 NOTE — Plan of Care (Signed)

## 2024-02-04 NOTE — Progress Notes (Signed)
  Progress Note   Patient: Dave Brown FMW:[id number] DOB: [birth year] DOA: 02/01/2024     3 DOS: the patient was seen and examined on 02/04/2024   Brief hospital course: 78 year old man PMH CKD stage IV, thrombocytopenia, diastolic CHF, presented with shortness of breath, orthopnea, cough.  Admitted for acute hypoxic respiratory failure secondary to CHF.  Consultants None   Procedures/Events 10/9 admit  Assessment and Plan: Acute hypoxic respiratory failure Acute on chronic diastolic CHF Nephrology previously stopped spironolactone 2D echocardiogram grade 3 diastolic dysfunction Clinically responding to Lasix , although output not quantified, will continue diuresis, follow BMP, renal function currently at baseline Will need home oxygen .  DME ordered.   Elevated troponin Demand ischemia Mild bradycardia Some component of demand ischemia from shortness of breath and heart failure, troponin also elevated related to CKD No signs or symptoms of ACS, EKG nonacute, no further evaluation suggested Holding beta-blocker   CKD stage IV Creatinine appears to be at baseline   Diabetes mellitus type 2 Hypoglycemia Hypoglycemia resolved.  Sliding scale insulin . Continue to hold glimepiride   Macrocytic anemia Folate and B12 within normal limits.  Mild.  Follow-up as an outpatient.   Chronic thrombocytopenia Stable   Obesity Body mass index is 43.04 kg/m.      Subjective:  Feels better  Physical Exam: Vitals:   02/03/24 2155 02/04/24 0500 02/04/24 0520 02/04/24 0838  BP: (!) 124/54  96/65 (!) 103/52  Pulse: 64  69 72  Resp: 20  20   Temp: 98.3 F (36.8 C)  97.9 F (36.6 C)   TempSrc: Oral  Oral   SpO2: (!) 87%  100%   Weight:  129.7 kg    Height:       Physical Exam Vitals reviewed.  Constitutional:      General: He is not in acute distress.    Appearance: He is not ill-appearing or toxic-appearing.  Cardiovascular:     Rate and Rhythm: Normal rate and regular  rhythm.     Heart sounds: No murmur heard. Pulmonary:     Effort: Pulmonary effort is normal. No respiratory distress.     Breath sounds: No wheezing, rhonchi or rales.  Neurological:     Mental Status: He is alert.  Psychiatric:        Mood and Affect: Mood normal.        Behavior: Behavior normal.     Data Reviewed: CBG stable Creatinine stable 2.6  Family Communication: none  Disposition: Status is: Inpatient Remains inpatient appropriate because: CHF     Time spent: 20 minutes  Author: Toribio Door, MD 02/04/2024 1:10 PM  For on call review [email or web address].

## 2024-02-04 NOTE — TOC Initial Note (Signed)
 Transition of Care [facility]) - Initial/Assessment Note    Patient Details  Name: Dave Brown MRN: [id number] Date of Birth: 1945-12-12  [facility]) CM/SW Contact:    Tawni CHRISTELLA Eva, LCSW Phone Number: 02/04/2024, 1:57 PM  Clinical Narrative:                  CSW received a message regarding home oxygen  arrangements. CSW has sent a referral to [employer] [facility], as the pt has [facility].The pt will require a walking oxygen  saturation note to qualify for home oxygen . Care Management to follow  Expected Discharge Plan: Home/Self Care Barriers to Discharge: Continued Medical Work up   Patient Goals and CMS Choice Patient states their goals for this hospitalization and ongoing recovery are:: Return home   Choice offered to / list presented to : NA      Expected Discharge Plan and Services In-house Referral: Clinical Social Work   Post Acute Care Choice: NA Living arrangements for the past 2 months: Single Family Home                 DME Arranged: N/A DME Agency: NA       HH Arranged: Patient Refused HH          Prior Living Arrangements/Services Living arrangements for the past 2 months: Single Family Home Lives with:: Self Patient language and need for interpreter reviewed:: Yes Do you feel safe going back to the place where you live?: Yes      Need for Family Participation in Patient Care: No (Comment) Care giver support system in place?: Yes (comment) Current home services: DME (Walker, cane, lift chair, ramp) Criminal Activity/Legal Involvement Pertinent to Current Situation/Hospitalization: No - Comment as needed  Activities of Daily Living   ADL Screening (condition at time of admission) Independently performs ADLs?: Yes (appropriate for developmental age) Is the patient deaf or have difficulty hearing?: Yes Does the patient have difficulty seeing, even when wearing glasses/contacts?: No Does the patient have difficulty concentrating,  remembering, or making decisions?: No  Permission Sought/Granted                  Emotional Assessment Appearance:: Appears stated age Attitude/Demeanor/Rapport: Engaged Affect (typically observed): Appropriate Orientation: : Oriented to Self, Oriented to Place, Oriented to  Time, Oriented to Situation Alcohol / Substance Use: Not Applicable Psych Involvement: No (comment)  Admission diagnosis:  Hypoxia [R09.02] Acute on chronic right-sided heart failure (HCC) [I50.813] Acute on chronic heart failure with preserved ejection fraction (HFpEF) (HCC) [I50.33] Patient Active Problem List   Diagnosis Date Noted   Acute on chronic heart failure with preserved ejection fraction (HFpEF) (HCC) 02/01/2024   Acute hypoxemic respiratory failure (HCC) 02/01/2024   Macrocytic anemia 02/01/2024   Hypoglycemia 02/01/2024   Chronic heart failure with preserved ejection fraction (HCC) 03/28/2022   Elevated brain natriuretic peptide (BNP) level 02/23/2022   Leg edema 02/23/2022   AKI (acute kidney injury)    COVID-19    Acute renal failure (ARF) 05/01/2020   Pneumonia due to COVID-19 virus 05/01/2020   Sleep apnea    CKD (chronic kidney disease) stage 4, GFR 15-29 ml/min (HCC)    Hypertension    Degenerative joint disease    High cholesterol    Obesity    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   PCP:  Debrah Josette ORN., PA-C Pharmacy:   [id number] - [place], [place] - [address] [place]  [address] Phone: [phone] Fax: [phone]     Social Drivers of Health (SDOH) Social History: SDOH Screenings   Food Insecurity: No Food Insecurity (02/01/2024)  Housing: Low Risk  (02/01/2024)  Transportation Needs: No Transportation Needs (02/01/2024)  Utilities: Not At Risk (02/01/2024)  Social Connections: Unknown (02/01/2024)  Tobacco Use: Medium Risk (02/01/2024)   SDOH Interventions:     Readmission Risk  Interventions     No data to display

## 2024-02-05 DIAGNOSIS — I5033 Acute on chronic diastolic (congestive) heart failure: Secondary | ICD-10-CM | POA: Diagnosis not present

## 2024-02-05 DIAGNOSIS — J9601 Acute respiratory failure with hypoxia: Secondary | ICD-10-CM | POA: Diagnosis not present

## 2024-02-05 DIAGNOSIS — N184 Chronic kidney disease, stage 4 (severe): Secondary | ICD-10-CM | POA: Diagnosis not present

## 2024-02-05 LAB — BASIC METABOLIC PANEL WITH GFR
Anion gap: 8 (ref 5–15)
BUN: 39 mg/dL — ABNORMAL HIGH (ref 8–23)
CO2: 34 mmol/L — ABNORMAL HIGH (ref 22–32)
Calcium: 9.6 mg/dL (ref 8.9–10.3)
Chloride: 100 mmol/L (ref 98–111)
Creatinine, Ser: 2.8 mg/dL — ABNORMAL HIGH (ref 0.61–1.24)
GFR, Estimated: 22 mL/min — ABNORMAL LOW (ref >60)
Glucose, Bld: 126 mg/dL — ABNORMAL HIGH (ref 70–99)
Potassium: 3.8 mmol/L (ref 3.5–5.1)
Sodium: 142 mmol/L (ref 135–145)

## 2024-02-05 LAB — GLUCOSE, CAPILLARY
Glucose-Capillary: 93 mg/dL (ref 70–99)
Glucose-Capillary: 90 mg/dL (ref 70–99)

## 2024-02-05 NOTE — TOC Transition Note (Signed)
 Transition of Care [facility]) - Discharge Note  Patient Details  Name: Dave Brown MRN: [id number] Date of Birth: 11-Jun-1945  Transition of Care [facility]) CM/SW Contact:  Duwaine GORMAN Aran, LCSW Phone Number: 02/05/2024, 12:44 PM  Clinical Narrative: Patient will no longer need home oxygen . Patient declined [facility] services again. Care management signing off.  Final next level of care: Home/Self Care Barriers to Discharge: Barriers Resolved  Patient Goals and CMS Choice Patient states their goals for this hospitalization and ongoing recovery are:: Return home Choice offered to / list presented to : NA  Discharge Plan and Services Additional resources added to the After Visit Summary for   In-house Referral: Clinical Social Work Post Acute Care Choice: NA          DME Arranged: N/A DME Agency: NA HH Arranged: Patient Refused HH  Social Drivers of Health (SDOH) Interventions SDOH Screenings   Food Insecurity: No Food Insecurity (02/01/2024)  Housing: Low Risk  (02/01/2024)  Transportation Needs: No Transportation Needs (02/01/2024)  Utilities: Not At Risk (02/01/2024)  Social Connections: Unknown (02/01/2024)  Tobacco Use: Medium Risk (02/01/2024)   Readmission Risk Interventions     No data to display

## 2024-02-05 NOTE — Progress Notes (Signed)
 SATURATION QUALIFICATIONS: (This note is used to comply with regulatory documentation for home oxygen )  Patient Saturations on Room Air at Rest = 97%  Patient Saturations on Room Air while Ambulating = 91%  Pt. Does not need home o2.

## 2024-02-05 NOTE — Discharge Summary (Signed)
 Physician Discharge Summary   Patient: Dave Brown MRN: [id number] DOB: Oct 17, 1945  Admit date:     02/01/2024  Discharge date: 02/05/24  Discharge Physician: Toribio Door   PCP: Debrah Josette ORN., PA-C   Recommendations at discharge:   Ongoing care for CHF  Discharge Diagnoses: Principal Problem:   Acute on chronic heart failure with preserved ejection fraction (HFpEF) (HCC) Active Problems:   CKD (chronic kidney disease) stage 4, GFR 15-29 ml/min (HCC)   Acute hypoxemic respiratory failure (HCC)   Macrocytic anemia   Hypoglycemia  Resolved Problems:   * No resolved hospital problems. *  Hospital Course: 78 year old man PMH CKD stage IV, thrombocytopenia, diastolic CHF, presented with shortness of breath, orthopnea, cough.  Admitted for acute hypoxic respiratory failure secondary to CHF.  CHF improved with diuresis and hypoxia resolved.  Did not require oxygen  on discharge.  Consultants None   Procedures/Events 10/9 admit  Acute hypoxic respiratory failure Acute on chronic diastolic CHF Nephrology previously stopped spironolactone 2D echocardiogram grade 3 diastolic dysfunction Improved with Lasix . Did not need home oxygen .   Elevated troponin Demand ischemia Mild bradycardia Some component of demand ischemia from shortness of breath and heart failure, troponin also elevated related to CKD No signs or symptoms of ACS, EKG nonacute, no further evaluation suggested Can resume beta-blocker   CKD stage IV Creatinine appears to be at baseline   Diabetes mellitus type 2 Hypoglycemia Hypoglycemia resolved.  Can resume glimepiride on discharge.   Macrocytic anemia Folate and B12 within normal limits.  Mild.  Follow-up as an outpatient.   Chronic thrombocytopenia Stable   Obesity Body mass index is 43.04 kg/m.  Disposition: Home health Diet recommendation:  Cardiac diet DISCHARGE MEDICATION: Allergies as of 02/05/2024   No Known Allergies       Medication List     TAKE these medications    furosemide  40 MG tablet Commonly known as: LASIX  Take 1 tablet (40 mg total) by mouth daily. PT. MUST MAKE AN APPOINTMENT IN ORDER TO RECEIVE FUTURE REFILLS. FIRST ATTEMPT. What changed:  when to take this additional instructions   glimepiride 1 MG tablet Commonly known as: AMARYL Take 1 mg by mouth daily with breakfast.   Ipratropium-Albuterol  20-100 MCG/ACT Aers respimat Commonly known as: COMBIVENT  Inhale 1 puff into the lungs 2 (two) times daily.   lisinopril 10 MG tablet Commonly known as: ZESTRIL Take 10 mg by mouth daily.   metoprolol  succinate 50 MG 24 hr tablet Commonly known as: TOPROL -XL Take 50 mg by mouth in the morning.   rosuvastatin  10 MG tablet Commonly known as: CRESTOR  Take 10 mg by mouth daily.   sertraline  25 MG tablet Commonly known as: ZOLOFT  Take 25 mg by mouth in the morning.   Tylenol  325 MG tablet Generic drug: acetaminophen  Take 325-650 mg by mouth every 8 (eight) hours as needed for mild pain (pain score 1-3) (or headaches).        Follow-up Information     Debrah Josette ORN., PA-C. Schedule an appointment as soon as possible for a visit in 1 week(s).   Specialty: Family Medicine Contact information: [address] [place] [address] [phone]         Elmira Newman PARAS, MD Follow up.   Specialties: Cardiology, Radiology Why: Office will contact you with an appointment Contact information: [address] [place] [address] [phone]                Discharge Exam: Fredricka Weights   02/03/24  [id number] 02/04/24 0500 02/05/24 0500  Weight: 130.3 kg 129.7 kg 130.3 kg   Physical Exam Vitals reviewed.  Constitutional:      General: He is not in acute distress.    Appearance: He is not ill-appearing or toxic-appearing.  Cardiovascular:     Rate and Rhythm: Normal rate and regular rhythm.     Heart sounds: No murmur heard. Pulmonary:     Effort:  Pulmonary effort is normal. No respiratory distress.     Breath sounds: No wheezing, rhonchi or rales.  Neurological:     Mental Status: He is alert.  Psychiatric:        Mood and Affect: Mood normal.        Behavior: Behavior normal.      Condition at discharge: good  The results of significant diagnostics from this hospitalization (including imaging, microbiology, ancillary and laboratory) are listed below for reference.   Imaging Studies: ECHOCARDIOGRAM COMPLETE Result Date: 02/02/2024    ECHOCARDIOGRAM REPORT   Patient Name:   Dave Brown Date of Exam: 02/02/2024 Medical Rec #:  [id number]   Height:       69.0 in Accession #:    [id number]  Weight:       291.4 lb Date of Birth:  [birth year]    BSA:          2.425 m Patient Age:    78 years    BP:           126/67 mmHg Patient Gender: M           HR:           67 bpm. Exam Location:  Inpatient Procedure: 2D Echo, Cardiac Doppler and Color Doppler (Both Spectral and Color            Flow Doppler were utilized during procedure). Indications:    I50.40* Unspecified combined systolic (congestive) and diastolic                 (congestive) heart failure.; R06.02 SOB  History:        Patient has prior history of Echocardiogram examinations, most                 recent 02/11/2022. CHF, Abnormal ECG, Arrythmias:AV Block,                 Signs/Symptoms:Edema, Shortness of Breath and Dyspnea; Risk                 Factors:Sleep Apnea, Hypertension and Dyslipidemia.  Sonographer:    Ellouise Mose RDCS Referring Phys: [id number] [facility]  Sonographer Comments: Patient is obese. Image acquisition challenging due to patient body habitus. Study delayed, paient in chair with wet chair and bed. delay IMPRESSIONS  1. Left ventricular ejection fraction, by estimation, is 60 to 65%. The left ventricle has normal function. The left ventricle has no regional wall motion abnormalities. There is moderate concentric left ventricular hypertrophy. Left ventricular  diastolic parameters are consistent with Grade III diastolic dysfunction (restrictive). Elevated left ventricular end-diastolic pressure.  2. Right ventricular systolic function is normal. The right ventricular size is normal. There is normal pulmonary artery systolic pressure.  3. Left atrial size was moderately dilated.  4. The mitral valve is normal in structure. Trivial mitral valve regurgitation. No evidence of mitral stenosis.  5. The aortic valve is tricuspid. There is mild calcification of the aortic valve. Aortic valve regurgitation is not visualized. Aortic valve sclerosis is present, with no evidence of  aortic valve stenosis.  6. The inferior vena cava is dilated in size with <50% respiratory variability, suggesting right atrial pressure of 15 mmHg. Comparison(s): Prior images unable to be directly viewed, comparison made by report only. FINDINGS  Left Ventricle: Left ventricular ejection fraction, by estimation, is 60 to 65%. The left ventricle has normal function. The left ventricle has no regional wall motion abnormalities. The left ventricular internal cavity size was normal in size. There is  moderate concentric left ventricular hypertrophy. Left ventricular diastolic parameters are consistent with Grade III diastolic dysfunction (restrictive). Elevated left ventricular end-diastolic pressure. Right Ventricle: The right ventricular size is normal. No increase in right ventricular wall thickness. Right ventricular systolic function is normal. There is normal pulmonary artery systolic pressure. The tricuspid regurgitant velocity is 2.01 m/s, and  with an assumed right atrial pressure of 15 mmHg, the estimated right ventricular systolic pressure is 31.2 mmHg. Left Atrium: Left atrial size was moderately dilated. Right Atrium: Right atrial size was normal in size. Pericardium: Trivial pericardial effusion is present. Presence of epicardial fat layer. Mitral Valve: The mitral valve is normal in structure.  Trivial mitral valve regurgitation. No evidence of mitral valve stenosis. MV peak gradient, 5.9 mmHg. The mean mitral valve gradient is 2.0 mmHg. Tricuspid Valve: The tricuspid valve is normal in structure. Tricuspid valve regurgitation is trivial. No evidence of tricuspid stenosis. Aortic Valve: The aortic valve is tricuspid. There is mild calcification of the aortic valve. Aortic valve regurgitation is not visualized. Aortic valve sclerosis is present, with no evidence of aortic valve stenosis. Pulmonic Valve: The pulmonic valve was not well visualized. Pulmonic valve regurgitation is not visualized. No evidence of pulmonic stenosis. Aorta: The aortic root and ascending aorta are structurally normal, with no evidence of dilitation. Venous: The inferior vena cava is dilated in size with less than 50% respiratory variability, suggesting right atrial pressure of 15 mmHg. IAS/Shunts: No atrial level shunt detected by color flow Doppler.  LEFT VENTRICLE PLAX 2D LVIDd:         4.10 cm      Diastology LVIDs:         2.70 cm      LV e' medial:    3.48 cm/s LV PW:         1.40 cm      LV E/e' medial:  31.9 LV IVS:        1.50 cm      LV e' lateral:   5.77 cm/s LVOT diam:     2.35 cm      LV E/e' lateral: 19.2 LV SV:         87 LV SV Index:   36 LVOT Area:     4.34 cm  LV Volumes (MOD) LV vol d, MOD A2C: 121.0 ml LV vol d, MOD A4C: 56.9 ml LV vol s, MOD A2C: 31.2 ml LV vol s, MOD A4C: 13.8 ml LV SV MOD A2C:     89.8 ml LV SV MOD A4C:     56.9 ml LV SV MOD BP:      67.7 ml RIGHT VENTRICLE            IVC RV S prime:     9.46 cm/s  IVC diam: 3.00 cm TAPSE (M-mode): 1.9 cm LEFT ATRIUM             Index        RIGHT ATRIUM           Index LA  diam:        4.20 cm 1.73 cm/m   RA Area:     15.40 cm LA Vol (A2C):   80.9 ml 33.37 ml/m  RA Volume:   40.00 ml  16.50 ml/m LA Vol (A4C):   84.4 ml 34.81 ml/m LA Biplane Vol: 83.4 ml 34.40 ml/m  AORTIC VALVE LVOT Vmax:   120.00 cm/s LVOT Vmean:  76.100 cm/s LVOT VTI:    0.200 m   AORTA Ao Root diam: 3.90 cm Ao Asc diam:  3.50 cm MITRAL VALVE                TRICUSPID VALVE MV Area (PHT): 4.21 cm     TR Peak grad:   16.2 mmHg MV Area VTI:   2.52 cm     TR Vmax:        201.00 cm/s MV Peak grad:  5.9 mmHg MV Mean grad:  2.0 mmHg     SHUNTS MV Vmax:       1.21 m/s     Systemic VTI:  0.20 m MV Vmean:      67.7 cm/s    Systemic Diam: 2.35 cm MV Decel Time: 180 msec MV E velocity: 111.00 cm/s MV A velocity: 52.00 cm/s MV E/A ratio:  2.13 Shelda Bruckner MD Electronically signed by Shelda Bruckner MD Signature Date/Time: 02/02/2024/4:15:08 PM    Final    DG Chest 2 View Result Date: 02/01/2024 CLINICAL DATA:  Shortness of breath EXAM: CHEST - 2 VIEW COMPARISON:  May 01, 2020 FINDINGS: Likely central vascular congestion. Likely small bilateral pleural effusions with likely bibasilar subsegmental atelectasis. No pneumothorax. Cardiomegaly.  No acute osseous findings. IMPRESSION: Likely central vascular congestion. Small bilateral pleural effusions. Cardiomegaly. Electronically Signed   By: Michaeline Blanch M.D.   On: 02/01/2024 15:50    Microbiology: Results for orders placed or performed during the hospital encounter of 02/01/24  Resp panel by RT-PCR (RSV, Flu A&B, Covid) Anterior Nasal Swab     Status: None   Collection Time: 02/01/24  5:34 PM   Specimen: Anterior Nasal Swab  Result Value Ref Range Status   SARS Coronavirus 2 by RT PCR NEGATIVE NEGATIVE Final    Comment: (NOTE) SARS-CoV-2 target nucleic acids are NOT DETECTED.  The SARS-CoV-2 RNA is generally detectable in upper respiratory specimens during the acute phase of infection. The lowest concentration of SARS-CoV-2 viral copies this assay can detect is 138 copies/mL. A negative result does not preclude SARS-Cov-2 infection and should not be used as the sole basis for treatment or other patient management decisions. A negative result may occur with  improper specimen collection/handling, submission of  specimen other than nasopharyngeal swab, presence of viral mutation(s) within the areas targeted by this assay, and inadequate number of viral copies(<138 copies/mL). A negative result must be combined with clinical observations, patient history, and epidemiological information. The expected result is Negative.  Fact Sheet for Patients:  BloggerCourse.com  Fact Sheet for Healthcare Providers:  SeriousBroker.it  This test is no t yet approved or cleared by the [place]  FDA and  has been authorized for detection and/or diagnosis of SARS-CoV-2 by FDA under an Emergency Use Authorization (EUA). This EUA will remain  in effect (meaning this test can be used) for the duration of the COVID-19 declaration under Section 564(b)(1) of the Act, 21 U.S.C.section 360bbb-3(b)(1), unless the authorization is terminated  or revoked sooner.       Influenza A by PCR NEGATIVE NEGATIVE Final  Influenza B by PCR NEGATIVE NEGATIVE Final    Comment: (NOTE) The Xpert Xpress SARS-CoV-2/FLU/RSV plus assay is intended as an aid in the diagnosis of influenza from Nasopharyngeal swab specimens and should not be used as a sole basis for treatment. Nasal washings and aspirates are unacceptable for Xpert Xpress SARS-CoV-2/FLU/RSV testing.  Fact Sheet for Patients: BloggerCourse.com  Fact Sheet for Healthcare Providers: SeriousBroker.it  This test is not yet approved or cleared by the [place]  FDA and has been authorized for detection and/or diagnosis of SARS-CoV-2 by FDA under an Emergency Use Authorization (EUA). This EUA will remain in effect (meaning this test can be used) for the duration of the COVID-19 declaration under Section 564(b)(1) of the Act, 21 U.S.C. section 360bbb-3(b)(1), unless the authorization is terminated or revoked.     Resp Syncytial Virus by PCR NEGATIVE NEGATIVE Final     Comment: (NOTE) Fact Sheet for Patients: BloggerCourse.com  Fact Sheet for Healthcare Providers: SeriousBroker.it  This test is not yet approved or cleared by the [place]  FDA and has been authorized for detection and/or diagnosis of SARS-CoV-2 by FDA under an Emergency Use Authorization (EUA). This EUA will remain in effect (meaning this test can be used) for the duration of the COVID-19 declaration under Section 564(b)(1) of the Act, 21 U.S.C. section 360bbb-3(b)(1), unless the authorization is terminated or revoked.  Performed at [facility], [address] [place], [place] [address]     Labs: CBC: Recent Labs  Lab 02/01/24 1532 02/02/24 0054  WBC 8.8 7.9  HGB 12.7* 11.8*  HCT 43.4 39.8  MCV 107.7* 107.0*  PLT 143* 118*   Basic Metabolic Panel: Recent Labs  Lab 02/01/24 1532 02/02/24 0054 02/03/24 0325 02/04/24 0355 02/05/24 0932  NA 146* 145 143 143 142  K 3.9 3.9 3.8 3.8 3.8  CL 110 108 105 105 100  CO2 27 29 29 28  34*  GLUCOSE 61* 76 103* 90 126*  BUN 30* 30* 36* 39* 39*  CREATININE 2.55* 2.52* 2.73* 2.60* 2.80*  CALCIUM  9.9 9.8 9.5 9.4 9.6   Liver Function Tests: Recent Labs  Lab 02/02/24 0054  AST 17  ALT 10  ALKPHOS 76  BILITOT 0.4  PROT 6.5  ALBUMIN 3.6   CBG: Recent Labs  Lab 02/04/24 1141 02/04/24 1626 02/04/24 1938 02/05/24 0749 02/05/24 1126  GLUCAP 91 116* 113* 93 90    Discharge time spent: less than 30 minutes.  Signed: Toribio Door, MD [facility] 02/05/2024

## 2024-02-05 NOTE — Progress Notes (Signed)
 Mobility Specialist - Progress Note  [place] - 2L Pre-mobility: 98% SpO2 During mobility: 97% SpO2 Post-mobility: 96% SPO2     02/05/24 1029  Mobility  Activity Ambulated independently (Simultaneous filing. User may not have seen previous data.)  Level of Assistance Independent (Simultaneous filing. User may not have seen previous data.)  Assistive Device None (Simultaneous filing. User may not have seen previous data.)  Distance Ambulated (ft) 300 ft (Simultaneous filing. User may not have seen previous data.)  Activity Response Tolerated well (Simultaneous filing. User may not have seen previous data.)  Mobility Referral Yes  Mobility visit 1 Mobility  Mobility Specialist Start Time (ACUTE ONLY) 1005  Mobility Specialist Stop Time (ACUTE ONLY) 1015  Mobility Specialist Time Calculation (min) (ACUTE ONLY) 10 min   Pt was received in bed and agreed to mobility. 1x small standing break during ambulation to check SpO2. Returned to bed with all needs met. Call bell in reach.  [employer] - Mobility Specialist

## 2024-02-23 NOTE — Progress Notes (Deleted)
 Cardiology Office Note   Date:  03/04/2024  ID:  Dave Brown, DOB April 14, 1946, MRN [id number] PCP: Dave Josette ORN., PA-C  [facility] Providers Cardiologist:  Dave JINNY Lawrence, MD { Click to update primary MD,subspecialty MD or APP then REFRESH:1}    History of Present Illness Dave Brown is a 78 y.o. male with a past medical history of HFpEF, hypertension, OSA--following with [facility], CKD stage IV--Dr. Howell, MGUS, obesity  02/02/2024 echo EF 60 to 65%, moderate concentric LVH, grade 3 DD, elevated LVEDP, normal PASP, LA moderately dilated, trivial MR 03/16/2022 echo EF 55 to 60%, moderate concentric LVH, grade 1 DD, trace TR  He established care with Dr. Lawrence in 2023 at the behest of his PCP for evaluation of pedal edema.  An echocardiogram was arranged at that time revealed a EF of 55 to 60%, moderate concentric LVH and grade 1 DD.  He had previously followed with Dr. Gatha for his MGUS, also nephrology in the past but apparently not following with them on a routine basis.  Most recently he was admitted to [facility] on 02/01/2024 to 02/05/2024 with acute on chronic heart failure exacerbation after presenting with shortness of breath, orthopnea, cough.  He was diuresed, repeat echocardiogram revealed preserved EF but grade 3 DD.  He was discharged on Lasix  and advised to follow-up with cardiology outpatient.  Evaluated by [facility], oxygen  ordered, sleep study.  BMET  ROS: ROS   Studies Reviewed      Cardiac Studies & Procedures   ______________________________________________________________________________________________     ECHOCARDIOGRAM  ECHOCARDIOGRAM COMPLETE 02/02/2024  Narrative ECHOCARDIOGRAM REPORT    Patient Name:   [name] Date of Exam: 02/02/2024 Medical Rec #:  [id number]   Height:       69.0 in Accession #:    [id number]  Weight:       291.4 lb Date of Birth:  03-Mar-1946    BSA:          2.425  m Patient Age:    78 years    BP:           126/67 mmHg Patient Gender: M           HR:           67 bpm. Exam Location:  Inpatient  Procedure: 2D Echo, Cardiac Doppler and Color Doppler (Both Spectral and Color Flow Doppler were utilized during procedure).  Indications:    I50.40* Unspecified combined systolic (congestive) and diastolic (congestive) heart failure.; R06.02 SOB  History:        Patient has prior history of Echocardiogram examinations, most recent 02/11/2022. CHF, Abnormal ECG, Arrythmias:AV Block, Signs/Symptoms:Edema, Shortness of Breath and Dyspnea; Risk Factors:Sleep Apnea, Hypertension and Dyslipidemia.  Sonographer:    Ellouise Mose RDCS Referring Phys: [id number] [name]   Sonographer Comments: Patient is obese. Image acquisition challenging due to patient body habitus. Study delayed, paient in chair with wet chair and bed. delay IMPRESSIONS   1. Left ventricular ejection fraction, by estimation, is 60 to 65%. The left ventricle has normal function. The left ventricle has no regional wall motion abnormalities. There is moderate concentric left ventricular hypertrophy. Left ventricular diastolic parameters are consistent with Grade III diastolic dysfunction (restrictive). Elevated left ventricular end-diastolic pressure. 2. Right ventricular systolic function is normal. The right ventricular size is normal. There is normal pulmonary artery systolic pressure. 3. Left atrial size was moderately dilated. 4. The mitral valve is normal in structure. Trivial mitral valve  regurgitation. No evidence of mitral stenosis. 5. The aortic valve is tricuspid. There is mild calcification of the aortic valve. Aortic valve regurgitation is not visualized. Aortic valve sclerosis is present, with no evidence of aortic valve stenosis. 6. The inferior vena cava is dilated in size with <50% respiratory variability, suggesting right atrial pressure of 15 mmHg.  Comparison(s):  Prior images unable to be directly viewed, comparison made by report only.  FINDINGS Left Ventricle: Left ventricular ejection fraction, by estimation, is 60 to 65%. The left ventricle has normal function. The left ventricle has no regional wall motion abnormalities. The left ventricular internal cavity size was normal in size. There is moderate concentric left ventricular hypertrophy. Left ventricular diastolic parameters are consistent with Grade III diastolic dysfunction (restrictive). Elevated left ventricular end-diastolic pressure.  Right Ventricle: The right ventricular size is normal. No increase in right ventricular wall thickness. Right ventricular systolic function is normal. There is normal pulmonary artery systolic pressure. The tricuspid regurgitant velocity is 2.01 m/s, and with an assumed right atrial pressure of 15 mmHg, the estimated right ventricular systolic pressure is 31.2 mmHg.  Left [facility]: Left atrial size was moderately dilated.  Right [facility]: Right atrial size was normal in size.  Pericardium: Trivial pericardial effusion is present. Presence of epicardial fat layer.  Mitral Valve: The mitral valve is normal in structure. Trivial mitral valve regurgitation. No evidence of mitral valve stenosis. MV peak gradient, 5.9 mmHg. The mean mitral valve gradient is 2.0 mmHg.  Tricuspid Valve: The tricuspid valve is normal in structure. Tricuspid valve regurgitation is trivial. No evidence of tricuspid stenosis.  Aortic Valve: The aortic valve is tricuspid. There is mild calcification of the aortic valve. Aortic valve regurgitation is not visualized. Aortic valve sclerosis is present, with no evidence of aortic valve stenosis.  Pulmonic Valve: The pulmonic valve was not well visualized. Pulmonic valve regurgitation is not visualized. No evidence of pulmonic stenosis.  Aorta: The aortic root and ascending aorta are structurally normal, with no evidence of dilitation.  Venous:  The inferior vena cava is dilated in size with less than 50% respiratory variability, suggesting right atrial pressure of 15 mmHg.  IAS/Shunts: No atrial level shunt detected by color flow Doppler.   LEFT VENTRICLE PLAX 2D LVIDd:         4.10 cm      Diastology LVIDs:         2.70 cm      LV e' medial:    3.48 cm/s LV PW:         1.40 cm      LV E/e' medial:  31.9 LV IVS:        1.50 cm      LV e' lateral:   5.77 cm/s LVOT diam:     2.35 cm      LV E/e' lateral: 19.2 LV SV:         87 LV SV Index:   36 LVOT Area:     4.34 cm  LV Volumes (MOD) LV vol d, MOD A2C: 121.0 ml LV vol d, MOD A4C: 56.9 ml LV vol s, MOD A2C: 31.2 ml LV vol s, MOD A4C: 13.8 ml LV SV MOD A2C:     89.8 ml LV SV MOD A4C:     56.9 ml LV SV MOD BP:      67.7 ml  RIGHT VENTRICLE            IVC RV S prime:  9.46 cm/s  IVC diam: 3.00 cm TAPSE (M-mode): 1.9 cm  LEFT ATRIUM             Index        RIGHT ATRIUM           Index LA diam:        4.20 cm 1.73 cm/m   RA Area:     15.40 cm LA Vol (A2C):   80.9 ml 33.37 ml/m  RA Volume:   40.00 ml  16.50 ml/m LA Vol (A4C):   84.4 ml 34.81 ml/m LA Biplane Vol: 83.4 ml 34.40 ml/m AORTIC VALVE LVOT Vmax:   120.00 cm/s LVOT Vmean:  76.100 cm/s LVOT VTI:    0.200 m  AORTA Ao Root diam: 3.90 cm Ao Asc diam:  3.50 cm  MITRAL VALVE                TRICUSPID VALVE MV Area (PHT): 4.21 cm     TR Peak grad:   16.2 mmHg MV Area VTI:   2.52 cm     TR Vmax:        201.00 cm/s MV Peak grad:  5.9 mmHg MV Mean grad:  2.0 mmHg     SHUNTS MV Vmax:       1.21 m/s     Systemic VTI:  0.20 m MV Vmean:      67.7 cm/s    Systemic Diam: 2.35 cm MV Decel Time: 180 msec MV E velocity: 111.00 cm/s MV A velocity: 52.00 cm/s MV E/A ratio:  2.13  Shelda Bruckner MD Electronically signed by Shelda Bruckner MD Signature Date/Time: 02/02/2024/4:15:08 PM    Final           ______________________________________________________________________________________________      Risk Assessment/Calculations {Does this patient have ATRIAL FIBRILLATION?[phone]} No BP recorded.  {Refresh Note OR Click here to enter BP  :1}***       Physical Exam VS:  There were no vitals taken for this visit.       Wt Readings from Last 3 Encounters:  02/05/24 287 lb 4.2 oz (130.3 kg)  06/27/22 285 lb (129.3 kg)  04/13/22 283 lb 9 oz (128.6 kg)    GEN: Well nourished, well developed in no acute distress NECK: No JVD; No carotid bruits CARDIAC: ***RRR, no murmurs, rubs, gallops RESPIRATORY:  Clear to auscultation without rales, wheezing or rhonchi  ABDOMEN: Soft, non-tender, non-distended EXTREMITIES:  No edema; No deformity   ASSESSMENT AND PLAN HFpEF -  CKD stage IV -  Hypertension -  OSA -     {Are you ordering a CV Procedure (e.g. stress test, cath, DCCV, TEE, etc)?   Press F2        :[id number]}  Dispo: ***  Signed, Delon JAYSON Hoover, NP

## 2024-03-04 NOTE — Progress Notes (Addendum)
 [facility] Sleep Medicine   Name: Dave Brown MRN: [id number] DOB: 1946/01/02 Referring provider: Kaplan, Kristen Diane, *  Chief Complaint   Chief Complaint  Patient presents with  . Consult    For SOB that is worse with activity. Pt also used to be on a CPAP several years ago but stopped wearing it due to issues with the mask.      Summary  Dave Brown is a 78 y.o. year-old male patient recently (02/01/2024 to 02/05/2024) in the hospital for congestive heart failure as well as chronic hypoxic respiratory failure, which improved with diuresis. Patient did not require oxygen  on discharge.  Patient has obstructive sleep apnea.   Subject  Patient arrives with daughter.  History of Present Illness The patient is a 78 year old male who presents for a post-hospitalization follow-up. He was admitted from 02/01/2024 through 02/05/2024 for congestive heart failure and chronic hypoxic respiratory failure, which improved with diuresis. He did not require oxygen  upon discharge. He has a history of obstructive sleep apnea and has a CPAP at home, which he has not been using due to mask issues.  He was unaware of his congestive heart failure until his recent hospitalization. He experiences shortness of breath, which is manageable when seated but exacerbated by physical activity such as walking. He has a history of smoking several packs of cigars daily during his youth, transitioning from cigarettes to cigars, and has abstained from smoking for the past 15 years. He reports no fevers or night sweats. He is currently on furosemide  40 mg daily and albuterol , which was initiated last week.  He has not used his CPAP machine for approximately 4 years. He reports dry mouth due to predominantly breathing through his mouth and snoring. He has not been informed of any episodes of apnea during sleep. He does not experience excessive daytime sleepiness, morning headaches, or  cough. He has been taking Mucinex , having consumed two bottles over the past two weeks.  He has a history of hypertension, which has been well-controlled with medication. He is on an ACE inhibitor and a beta-blocker.  He has a history of kidney disease and is under the care of a nephrologist.  He has noticed swelling in his feet and ankles, which has worsened since his hospital discharge. His weight has fluctuated between 289 and 291 pounds.  Today he is 297 pounds. He has a scheduled appointment with cardiology in 2 days.  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 Hobbies: Reading books, taking care of chickens. Exercise: Reports not exercising currently. Diet: Eats cereal, eggs, sausage, cheese, and bread for breakfast; sandwiches for lunch; and various meals prepared by his son for dinner. Uses lactose-free milk. Tobacco: Smoked cigarettes and cigars in the past, but has not smoked for at least 15 years.   Past Medical History  Medical History[1]   Past Surgical History  He  has a past surgical history that includes Bladder stone removal.  Social History  He  reports that he has quit smoking. He has never been exposed to tobacco smoke. He has never used smokeless tobacco. He reports that he does not drink alcohol and does not use drugs.  Family History  His family history includes Cancer in his mother.    Vital Signs  BP 128/72 (BP Location: Right arm, Patient Position: Sitting)   Pulse 73   Temp 98.2 F (36.8 C) (Oral)   Resp 18  Ht 1.753 m (5' 9)   Wt 135 kg (297 lb 12.8 oz)   SpO2 (!) 88% Comment: on room air  BMI 43.98 kg/m   Review of Systems   Constitutional: Negative for fever HENT: Negative for congestion, post nasal drip, sinus pain and sore throat.  Eyes: Negative Respiratory: Positive for cough and shortness of breath on exertion Endocrine: Negative Genitourinary:  Negative Musculoskeletal: Negative Skin: Negative for rashes Allergic/Immunologic: Negative for environmental allergies, food allergies.  Negative for immunocompromised.  Neurological: Negative for weakness and headache Hematological: Negative Psychiatric/behavioral: Positive sleep disturbance   Physical Exam  General - pleasant HENT - no sinus tenderness, no oral exudate, tongue scalloping, Mallampati 3 Cardiac - regular rate and rhythm, no murmurs Chest - breath sounds equal bilaterally, no wheezing or rales Ext - no edema, cyanosis, or clubbing Skin - no rashes Psych - normal mood and behavior   Pulmonary Tests  Pulmonary Functions Testing Results:  FEV1  Date Value Ref Range Status  03/04/2024 0.98 L    FEV1/FVC  Date Value Ref Range Status  03/04/2024 72 %       Sleep Tests      Chest Imaging    Cardiac Tests       Labs   Lab Results  Component Value Date   WBC 9.00 04/10/2023   HGB 14.3 04/10/2023   HCT 42.6 04/10/2023   PLT 118 (L) 04/10/2023   CHOL 129 04/10/2023   TRIG 131 04/10/2023   HDL 33 (L) 04/10/2023   LDLDIRECT 83 04/07/2022   ALT 7 04/10/2023   AST 13 04/10/2023   NA 139 04/10/2023   K 4.0 04/10/2023   CL 103 04/10/2023   CREATININE 3.12 (H) 04/10/2023   BUN 44 (H) 04/10/2023   CO2 28 04/10/2023   VITD 44.3 04/10/2023    Micro/Sputum       Assessment/Plan   Assessment & Plan 1. Congestive Heart Failure. -The patient was hospitalized from 02/01/2024 to 02/05/2024 for congestive heart failure, which improved with diuresis. He is currently on furosemide  40 mg daily. Due to persistent shortness of breath and fluid retention, he is advised to take an additional 20 mg of furosemide  immediately upon returning home. He should monitor his response to the medication and inform his cardiologist during the upcoming appointment on 03/06/2024.  -Oxygen  therapy will be initiated at home, starting with 2 L to assess its effectiveness.  -A CT  scan will be ordered to further evaluate his lung condition.  2. Chronic Hypoxic Respiratory Failure. -The patient's chronic hypoxic respiratory failure improved with diuresis during hospitalization.  -Oxygen  levels remain low, likely due to congestive heart failure and fluid accumulation.  -Oxygen  therapy will be initiated at home, starting with 2 L.  -A CT scan will be ordered to evaluate his lung condition further.  3. Obstructive Sleep Apnea. The patient has a history of obstructive sleep apnea and has not been using his CPAP due to mask issues.  -An in-lab sleep study will be ordered to reassess his condition and determine the need for updated CPAP therapy. - Epworth Sleepiness Scale 16  4.  Lower extremity edema. -The patient has swelling in his feet and ankles, which has worsened since returning from the hospital.  -This is likely due to fluid retention associated with congestive heart failure.  -He is advised to take an additional 20 mg of furosemide  immediately upon returning home and to elevate his feet when sitting as well as avoiding all salt.  5. Smoking History. -The patient has a significant smoking history, which may have contributed to his lung condition. - A CT scan will be ordered to evaluate his lungs further.  6.  Shortness of breath on exertion -This is due to combination of his smoking history, congestive heart failure, and edema.  - Symbicort inhaler will be prescribed, with instructions to use 2 puffs in the morning and 2 puffs at night, and to rinse his mouth after use to prevent mouth sores.  Follow-up The patient will follow up in 4 weeks.   Plan was discussed with Dr. Shellia as my supervising physician.   Patient Instructions   Patient Instructions  Orders placed:  - In-lab sleep study - CT scan of chest - Oxygen  ordered for home 2L to be worn at all times - Symbicort 160/4.5 2 puffs twice daily and 1 puff as needed for shortness of breath, wheezing  or chest tightness. No more than 12 puffs a day   Keep up to date on your immunizations, especially influenza and pneumonia.  Follow up: 4 weeks  Thank you for allowing us  to take care of you today.   Hadassah Alt, MSN, FNP-C [facility]    Allergies/ Medications/ Immunizations   Allergies[2]    Medication List       * Accurate as of March 04, 2024  3:30 PM. If you have any questions, ask your nurse or doctor.          START taking these medications    budesonide-formoteroL 160-4.5 mcg/actuation inhaler Commonly known as: Symbicort Inhale 2 puffs 2 (two) times a day. Started by: Hadassah Alt, NP       CONTINUE taking these medications    acetaminophen  325 mg tablet Commonly known as: TYLENOL  Take 325 mg by mouth every 8 (eight) hours as needed for mild pain (1-3) or headaches (1-2 tablets). Listed on [facility] dc summary   albuterol  sulfate 2.5 mg/0.5 mL Nebu nebulizer solution Take 0.5 mL (2.5 mg total) by nebulization every 6 (six) hours as needed for wheezing.   Combivent  Respimat 20-100 mcg/actuation inhaler Generic drug: ipratropium-albuteroL  INHALE 1 PUFF INTO THE LUNGS FOUR TIMES DAILY   furosemide  40 mg tablet Commonly known as: LASIX  Take 1 tablet (40 mg total) by mouth daily.   glimepiride 1 mg tablet Commonly known as: AMARYL Take 1 tablet (1 mg total) by mouth daily before breakfast.   lisinopriL 10 mg tablet Commonly known as: PRINIVIL Take 1 tablet (10 mg total) by mouth daily.   metoprolol  succinate 50 mg 24 hr tablet Commonly known as: TOPROL  XL TAKE 1 TABLET EVERY DAY   nebulizers Misc Use as directed.   Precision Q-I-D Test test strip Generic drug: glucose blood Please check blood sugars twice a day and as needed   rosuvastatin  10 mg tablet Commonly known as: CRESTOR  Take 1 tablet (10 mg total) by mouth daily.   sertraline  25 mg tablet Commonly known as: ZOLOFT  TAKE 1 TABLET EVERY DAY          Where to Get Your Medications     These medications were sent to [facility] [address] [place] - [address] - PHONE: [phone] - FAX: [phone]  [address], [place] [address]    Phone: [phone]  budesonide-formoteroL 160-4.5 mcg/actuation inhaler     Immunization History  Administered Date(s) Administered  . TDAP VACCINE (BOOSTRIX,ADACEL) 7Y+ 08/11/2010     Time Spent   On the  day of the visit I spent 50 minutes preparing to see the patient, obtaining and/or reviewing separately obtained history, performing a medically appropriate examination and evaluation, counseling and educating the patient/caregiver, ordering medications, test, or procedures, and documenting clinical information in the electronic medical record.This time does not include any time spent performing procedures or assesments that are separately billable.     Signature  Hadassah Duwayne Alt, NP 03/04/2024, 3:30 PM  [address]   [address]   [address] [place], [place] [address] [place], [place] [address]  [place], [place] [address] [phone]  [phone]  [phone] - [phone]  Completion of this note was preformed using [employer] software.       [1] Past Medical History: Diagnosis Date  . Hearing loss   . Hypercholesterolemia   . Hypertension   . Hypokalemia   . Obesity   . Osteoarthritis   . Sleep apnea   [2] No Known Allergies *Some images could not be shown.

## 2024-03-04 NOTE — Progress Notes (Signed)
 Oxygen  saturation on room air at rest= 87%                 Oxygen  saturation on 2 liters oxygen  via nasal cannula at rest= 93%

## 2024-03-04 NOTE — Progress Notes (Signed)
 Please pull order for oxygen  start

## 2024-03-06 ENCOUNTER — Encounter (HOSPITAL_COMMUNITY): Payer: Self-pay

## 2024-03-06 ENCOUNTER — Ambulatory Visit: Admitting: Cardiology

## 2024-03-06 ENCOUNTER — Inpatient Hospital Stay (HOSPITAL_COMMUNITY)
Admission: EM | Admit: 2024-03-06 | Discharge: 2024-03-08 | DRG: 291 | Disposition: A | Discharge status: Other Acute Inpatient Hospital | Attending: Internal Medicine | Admitting: Internal Medicine

## 2024-03-06 ENCOUNTER — Other Ambulatory Visit: Payer: Self-pay

## 2024-03-06 ENCOUNTER — Emergency Department (HOSPITAL_COMMUNITY)

## 2024-03-06 DIAGNOSIS — I10 Essential (primary) hypertension: Secondary | ICD-10-CM | POA: Insufficient documentation

## 2024-03-06 DIAGNOSIS — E11649 Type 2 diabetes mellitus with hypoglycemia without coma: Secondary | ICD-10-CM | POA: Diagnosis present

## 2024-03-06 DIAGNOSIS — R031 Nonspecific low blood-pressure reading: Secondary | ICD-10-CM

## 2024-03-06 DIAGNOSIS — I2489 Other forms of acute ischemic heart disease: Secondary | ICD-10-CM | POA: Diagnosis present

## 2024-03-06 DIAGNOSIS — J181 Lobar pneumonia, unspecified organism: Secondary | ICD-10-CM | POA: Insufficient documentation

## 2024-03-06 DIAGNOSIS — E1165 Type 2 diabetes mellitus with hyperglycemia: Secondary | ICD-10-CM | POA: Diagnosis not present

## 2024-03-06 DIAGNOSIS — E162 Hypoglycemia, unspecified: Secondary | ICD-10-CM

## 2024-03-06 DIAGNOSIS — D696 Thrombocytopenia, unspecified: Secondary | ICD-10-CM | POA: Diagnosis present

## 2024-03-06 DIAGNOSIS — Z87442 Personal history of urinary calculi: Secondary | ICD-10-CM

## 2024-03-06 DIAGNOSIS — N184 Chronic kidney disease, stage 4 (severe): Secondary | ICD-10-CM

## 2024-03-06 DIAGNOSIS — Z9981 Dependence on supplemental oxygen: Secondary | ICD-10-CM

## 2024-03-06 DIAGNOSIS — Z91199 Patient's noncompliance with other medical treatment and regimen due to unspecified reason: Secondary | ICD-10-CM

## 2024-03-06 DIAGNOSIS — Z7984 Long term (current) use of oral hypoglycemic drugs: Secondary | ICD-10-CM

## 2024-03-06 DIAGNOSIS — N179 Acute kidney failure, unspecified: Secondary | ICD-10-CM | POA: Diagnosis present

## 2024-03-06 DIAGNOSIS — I5032 Chronic diastolic (congestive) heart failure: Secondary | ICD-10-CM

## 2024-03-06 DIAGNOSIS — G4733 Obstructive sleep apnea (adult) (pediatric): Secondary | ICD-10-CM | POA: Diagnosis present

## 2024-03-06 DIAGNOSIS — I132 Hypertensive heart and chronic kidney disease with heart failure and with stage 5 chronic kidney disease, or end stage renal disease: Secondary | ICD-10-CM | POA: Diagnosis not present

## 2024-03-06 DIAGNOSIS — R062 Wheezing: Secondary | ICD-10-CM | POA: Diagnosis not present

## 2024-03-06 DIAGNOSIS — J9621 Acute and chronic respiratory failure with hypoxia: Principal | ICD-10-CM

## 2024-03-06 DIAGNOSIS — I5033 Acute on chronic diastolic (congestive) heart failure: Secondary | ICD-10-CM | POA: Diagnosis present

## 2024-03-06 DIAGNOSIS — E66813 Obesity, class 3: Secondary | ICD-10-CM | POA: Insufficient documentation

## 2024-03-06 DIAGNOSIS — Z1152 Encounter for screening for COVID-19: Secondary | ICD-10-CM

## 2024-03-06 DIAGNOSIS — N185 Chronic kidney disease, stage 5: Secondary | ICD-10-CM | POA: Diagnosis present

## 2024-03-06 DIAGNOSIS — E1122 Type 2 diabetes mellitus with diabetic chronic kidney disease: Secondary | ICD-10-CM | POA: Diagnosis present

## 2024-03-06 DIAGNOSIS — R7989 Other specified abnormal findings of blood chemistry: Secondary | ICD-10-CM

## 2024-03-06 DIAGNOSIS — Z6841 Body Mass Index (BMI) 40.0 and over, adult: Secondary | ICD-10-CM

## 2024-03-06 DIAGNOSIS — D539 Nutritional anemia, unspecified: Secondary | ICD-10-CM | POA: Diagnosis present

## 2024-03-06 DIAGNOSIS — J440 Chronic obstructive pulmonary disease with acute lower respiratory infection: Secondary | ICD-10-CM | POA: Diagnosis present

## 2024-03-06 DIAGNOSIS — E7800 Pure hypercholesterolemia, unspecified: Secondary | ICD-10-CM | POA: Diagnosis present

## 2024-03-06 DIAGNOSIS — I5043 Acute on chronic combined systolic (congestive) and diastolic (congestive) heart failure: Secondary | ICD-10-CM | POA: Diagnosis present

## 2024-03-06 DIAGNOSIS — I5031 Acute diastolic (congestive) heart failure: Secondary | ICD-10-CM | POA: Diagnosis present

## 2024-03-06 DIAGNOSIS — R579 Shock, unspecified: Secondary | ICD-10-CM | POA: Diagnosis present

## 2024-03-06 DIAGNOSIS — Z87891 Personal history of nicotine dependence: Secondary | ICD-10-CM

## 2024-03-06 DIAGNOSIS — Z7951 Long term (current) use of inhaled steroids: Secondary | ICD-10-CM

## 2024-03-06 DIAGNOSIS — Z79899 Other long term (current) drug therapy: Secondary | ICD-10-CM

## 2024-03-06 DIAGNOSIS — J9811 Atelectasis: Secondary | ICD-10-CM | POA: Diagnosis present

## 2024-03-06 HISTORY — DX: Respiratory failure, unspecified with hypoxia: J96.91

## 2024-03-06 HISTORY — DX: Heart failure, unspecified: I50.9

## 2024-03-06 LAB — TROPONIN T, HIGH SENSITIVITY
Troponin T High Sensitivity: 178 ng/L (ref 0–19)
Troponin T High Sensitivity: 196 ng/L (ref 0–19)

## 2024-03-06 LAB — COMPREHENSIVE METABOLIC PANEL WITH GFR
ALT: 18 U/L (ref 0–44)
AST: 20 U/L (ref 15–41)
Albumin: 3.7 g/dL (ref 3.5–5.0)
Alkaline Phosphatase: 91 U/L (ref 38–126)
Anion gap: 8 (ref 5–15)
BUN: 46 mg/dL — ABNORMAL HIGH (ref 8–23)
CO2: 31 mmol/L (ref 22–32)
Calcium: 9 mg/dL (ref 8.9–10.3)
Chloride: 102 mmol/L (ref 98–111)
Creatinine, Ser: 4.15 mg/dL — ABNORMAL HIGH (ref 0.61–1.24)
GFR, Estimated: 14 mL/min — ABNORMAL LOW (ref >60)
Glucose, Bld: 48 mg/dL — ABNORMAL LOW (ref 70–99)
Potassium: 4.1 mmol/L (ref 3.5–5.1)
Sodium: 141 mmol/L (ref 135–145)
Total Bilirubin: 0.3 mg/dL (ref 0.0–1.2)
Total Protein: 6.7 g/dL (ref 6.5–8.1)

## 2024-03-06 LAB — CBC
HCT: 40.9 % (ref 39.0–52.0)
Hemoglobin: 12.3 g/dL — ABNORMAL LOW (ref 13.0–17.0)
MCH: 32.7 pg (ref 26.0–34.0)
MCHC: 30.1 g/dL (ref 30.0–36.0)
MCV: 108.8 fL — ABNORMAL HIGH (ref 80.0–100.0)
Platelets: 138 K/uL — ABNORMAL LOW (ref 150–400)
RBC: 3.76 MIL/uL — ABNORMAL LOW (ref 4.22–5.81)
RDW: 14.1 % (ref 11.5–15.5)
WBC: 11 K/uL — ABNORMAL HIGH (ref 4.0–10.5)
nRBC: 0 % (ref 0.0–0.2)

## 2024-03-06 LAB — CBG MONITORING, ED
Glucose-Capillary: 199 mg/dL — ABNORMAL HIGH (ref 70–99)
Glucose-Capillary: 35 mg/dL — CL (ref 70–99)
Glucose-Capillary: 93 mg/dL (ref 70–99)

## 2024-03-06 LAB — LACTIC ACID, PLASMA
Lactic Acid, Venous: 1 mmol/L (ref 0.5–1.9)
Lactic Acid, Venous: 0.8 mmol/L (ref 0.5–1.9)

## 2024-03-06 LAB — RESP PANEL BY RT-PCR (RSV, FLU A&B, COVID)  RVPGX2
Influenza A by PCR: NEGATIVE
Influenza B by PCR: NEGATIVE
Resp Syncytial Virus by PCR: NEGATIVE
SARS Coronavirus 2 by RT PCR: NEGATIVE

## 2024-03-06 LAB — PRO BRAIN NATRIURETIC PEPTIDE: Pro Brain Natriuretic Peptide: 16139 pg/mL — ABNORMAL HIGH (ref <300.0)

## 2024-03-06 MED ORDER — FUROSEMIDE 10 MG/ML IJ SOLN
40.0000 mg | Freq: Once | INTRAMUSCULAR | Status: AC
  Administered 2024-03-06: 40 mg via INTRAVENOUS
  Filled 2024-03-06: qty 4

## 2024-03-06 MED ORDER — IPRATROPIUM BROMIDE 0.02 % IN SOLN
0.5000 mg | Freq: Once | RESPIRATORY_TRACT | Status: AC
  Administered 2024-03-06: 0.5 mg via RESPIRATORY_TRACT
  Filled 2024-03-06: qty 2.5

## 2024-03-06 MED ORDER — ALBUTEROL SULFATE (2.5 MG/3ML) 0.083% IN NEBU
5.0000 mg | INHALATION_SOLUTION | Freq: Once | RESPIRATORY_TRACT | Status: AC
  Administered 2024-03-06: 5 mg via RESPIRATORY_TRACT
  Filled 2024-03-06: qty 6

## 2024-03-06 MED ORDER — METHYLPREDNISOLONE SODIUM SUCC 125 MG IJ SOLR
125.0000 mg | Freq: Once | INTRAMUSCULAR | Status: AC
  Administered 2024-03-06: 125 mg via INTRAVENOUS
  Filled 2024-03-06: qty 2

## 2024-03-06 MED ORDER — SODIUM CHLORIDE 0.9 % IV SOLN
1.0000 g | Freq: Once | INTRAVENOUS | Status: AC
  Administered 2024-03-06: 1 g via INTRAVENOUS
  Filled 2024-03-06: qty 10

## 2024-03-06 MED ORDER — DEXTROSE 50 % IV SOLN
25.0000 g | Freq: Once | INTRAVENOUS | Status: AC
  Administered 2024-03-06: 25 g via INTRAVENOUS
  Filled 2024-03-06: qty 50

## 2024-03-06 MED ORDER — SODIUM CHLORIDE 0.9 % IV SOLN
500.0000 mg | Freq: Once | INTRAVENOUS | Status: AC
  Administered 2024-03-06: 500 mg via INTRAVENOUS
  Filled 2024-03-06: qty 5

## 2024-03-06 MED ORDER — LACTATED RINGERS IV BOLUS
250.0000 mL | Freq: Once | INTRAVENOUS | Status: AC
  Administered 2024-03-06: 250 mL via INTRAVENOUS

## 2024-03-06 NOTE — ED Notes (Signed)
MD notified of pts low blood pressure

## 2024-03-06 NOTE — Telephone Encounter (Signed)
 Spoke with patients daughter kim. She states patient has been really dizzy this morning, congested, and has a productive cough with clear phlegm. Patient had received oxygen  yesterday and she worried this could be causing this dizziness. She wasn't sure what his sats where this morning, but would check it when she could. I advised the goal was to keep his oxygen  at 90% or better and she could adjust his oxygen  as needed.  No fever, but states legs were really swollen this morning even after giving extra half dose of lasix . I spoke with Hadassah and she advised patient be seen in ER for further evaluation. I called daughter back to advise of this She will call the office back if anything else is needed. NFN

## 2024-03-06 NOTE — ED Notes (Signed)
 MD notified of pt's low blood pressure and MAP.

## 2024-03-06 NOTE — ED Provider Notes (Signed)
 [place] EMERGENCY DEPARTMENT AT [facility] Provider Note   CSN: [id number] Arrival date & time: 03/06/24  2118     Patient presents with: Shortness of Breath   Dave Brown is a 78 y.o. male.   Pt with hx copd, chronic resp failure, with increased sob in past day. Notes recently admission for same. States compliant w meds, except cpap. Uses mdi/neb treatments prn, former smoker. Denies new or worsening cough or uri symptoms. No fever or chills. No current or recent chest pain or discomfort. ?mildly increased bilateral leg edema. +orthopnea.  EMS notes blod sugar low, 38, gave glucose with improvement in mental status. Notes bp initially also soft, currently normal.   The history is provided by the patient, medical records and the EMS personnel. The history is limited by the condition of the patient.  Shortness of Breath Associated symptoms: no abdominal pain, no chest pain, no cough, no fever, no headaches, no neck pain, no rash, no sore throat and no vomiting        Prior to Admission medications   Medication Sig Start Date End Date Taking? Authorizing Provider  albuterol  (PROVENTIL ) (2.5 MG/3ML) 0.083% nebulizer solution Take 2.5 mg by nebulization every 6 (six) hours as needed for shortness of breath or wheezing. 03/04/24   [provider]  budesonide-formoterol (SYMBICORT) 160-4.5 MCG/ACT inhaler Inhale 2 puffs into the lungs in the morning and at bedtime. 03/04/24 03/04/25  [provider]  furosemide  (LASIX ) 40 MG tablet Take 1 tablet (40 mg total) by mouth daily. PT. MUST MAKE AN APPOINTMENT IN ORDER TO RECEIVE FUTURE REFILLS. FIRST ATTEMPT. Patient taking differently: Take 40 mg by mouth in the morning. 09/20/23   [name], [name], MD  glimepiride (AMARYL) 1 MG tablet Take 1 mg by mouth daily with breakfast.    [provider]  Ipratropium-Albuterol  (COMBIVENT ) 20-100 MCG/ACT AERS respimat Inhale 1 puff into the lungs 2 (two) times  daily. 05/03/20   [name], [name], MD  lisinopril (ZESTRIL) 10 MG tablet Take 10 mg by mouth daily. 01/03/22   [provider]  metoprolol  succinate (TOPROL -XL) 50 MG 24 hr tablet Take 50 mg by mouth in the morning. 05/27/12   [provider]  rosuvastatin  (CRESTOR ) 10 MG tablet Take 10 mg by mouth daily.    [provider]  sertraline  (ZOLOFT ) 25 MG tablet Take 25 mg by mouth in the morning.    [provider]  TYLENOL  325 MG tablet Take 325-650 mg by mouth every 8 (eight) hours as needed for mild pain (pain score 1-3) (or headaches).    [provider]    Allergies: Patient has no known allergies.    Review of Systems  Constitutional:  Negative for chills and fever.  HENT:  Negative for sore throat.   Respiratory:  Positive for shortness of breath. Negative for cough.   Cardiovascular:  Positive for leg swelling. Negative for chest pain.  Gastrointestinal:  Negative for abdominal pain, nausea and vomiting.  Genitourinary:  Negative for decreased urine volume, dysuria and flank pain.  Musculoskeletal:  Negative for back pain and neck pain.  Skin:  Negative for rash.  Neurological:  Negative for headaches.  Psychiatric/Behavioral:  Negative for confusion.     Updated Vital Signs BP (!) 91/55   Pulse 60   Temp 98.4 F (36.9 C) (Oral)   Resp (!) 28   Ht 1.753 m (5' 9)   Wt 133 kg   SpO2 94%   BMI 43.30  kg/m   Physical Exam Vitals and nursing note reviewed.  Constitutional:      Appearance: Normal appearance. He is well-developed.  HENT:     Head: Atraumatic.     Nose: Nose normal.     Mouth/Throat:     Mouth: Mucous membranes are moist.     Pharynx: Oropharynx is clear.  Eyes:     General: No scleral icterus.    Conjunctiva/sclera: Conjunctivae normal.     Pupils: Pupils are equal, round, and reactive to light.  Neck:     Trachea: No tracheal deviation.  Cardiovascular:     Rate and Rhythm: Normal rate and regular rhythm.      Pulses: Normal pulses.     Heart sounds: Normal heart sounds. No murmur heard.    No friction rub. No gallop.  Pulmonary:     Effort: Pulmonary effort is normal. No accessory muscle usage or respiratory distress.     Breath sounds: Wheezing and rales present.  Abdominal:     General: Bowel sounds are normal. There is no distension.     Palpations: Abdomen is soft. There is no mass.     Tenderness: There is no abdominal tenderness. There is no guarding.     Comments: Obese.   Genitourinary:    Comments: No cva tenderness. Musculoskeletal:        General: No tenderness.     Cervical back: Normal range of motion and neck supple. No rigidity.     Comments: Symmetric bilateral leg edema.   Skin:    General: Skin is warm and dry.     Findings: No rash.  Neurological:     Mental Status: He is alert.     Comments: Alert, speech clear. Motor/sens grossly intact bil.   Psychiatric:        Mood and Affect: Mood normal.     (all labs ordered are listed, but only abnormal results are displayed) Results for orders placed or performed during the hospital encounter of 03/06/24  CBG monitoring, ED   Collection Time: 03/06/24  9:22 PM  Result Value Ref Range   Glucose-Capillary 199 (H) 70 - 99 mg/dL  Troponin T, High Sensitivity   Collection Time: 03/06/24  9:23 PM  Result Value Ref Range   Troponin T High Sensitivity 178 (HH) 0 - 19 ng/L  CBC   Collection Time: 03/06/24  9:29 PM  Result Value Ref Range   WBC 11.0 (H) 4.0 - 10.5 K/uL   RBC 3.76 (L) 4.22 - 5.81 MIL/uL   Hemoglobin 12.3 (L) 13.0 - 17.0 g/dL   HCT 59.0 60.9 - 47.9 %   MCV 108.8 (H) 80.0 - 100.0 fL   MCH 32.7 26.0 - 34.0 pg   MCHC 30.1 30.0 - 36.0 g/dL   RDW 85.8 88.4 - 84.4 %   Platelets 138 (L) 150 - 400 K/uL   nRBC 0.0 0.0 - 0.2 %  Comprehensive metabolic panel with GFR   Collection Time: 03/06/24  9:29 PM  Result Value Ref Range   Sodium 141 135 - 145 mmol/L   Potassium 4.1 3.5 - 5.1 mmol/L   Chloride 102 98 -  111 mmol/L   CO2 31 22 - 32 mmol/L   Glucose, Bld 48 (L) 70 - 99 mg/dL   BUN 46 (H) 8 - 23 mg/dL   Creatinine, Ser 5.84 (H) 0.61 - 1.24 mg/dL   Calcium  9.0 8.9 - 10.3 mg/dL   Total Protein 6.7 6.5 - 8.1 g/dL  Albumin 3.7 3.5 - 5.0 g/dL   AST 20 15 - 41 U/L   ALT 18 0 - 44 U/L   Alkaline Phosphatase 91 38 - 126 U/L   Total Bilirubin 0.3 0.0 - 1.2 mg/dL   GFR, Estimated 14 (L) >60 mL/min   Anion gap 8 5 - 15  Pro Brain natriuretic peptide   Collection Time: 03/06/24  9:29 PM  Result Value Ref Range   Pro Brain Natriuretic Peptide 16,139.0 (H) <300.0 pg/mL  Lactic acid, plasma   Collection Time: 03/06/24  9:29 PM  Result Value Ref Range   Lactic Acid, Venous 1.0 0.5 - 1.9 mmol/L  Resp panel by RT-PCR (RSV, Flu A&B, Covid) Anterior Nasal Swab   Collection Time: 03/06/24  9:33 PM   Specimen: Anterior Nasal Swab  Result Value Ref Range   SARS Coronavirus 2 by RT PCR NEGATIVE NEGATIVE   Influenza A by PCR NEGATIVE NEGATIVE   Influenza B by PCR NEGATIVE NEGATIVE   Resp Syncytial Virus by PCR NEGATIVE NEGATIVE  Lactic acid, plasma   Collection Time: 03/06/24 10:55 PM  Result Value Ref Range   Lactic Acid, Venous 0.8 0.5 - 1.9 mmol/L  CBG monitoring, ED   Collection Time: 03/06/24 10:56 PM  Result Value Ref Range   Glucose-Capillary 35 (LL) 70 - 99 mg/dL   Comment 1 Document in Chart   CBG monitoring, ED   Collection Time: 03/06/24 11:31 PM  Result Value Ref Range   Glucose-Capillary 93 70 - 99 mg/dL     EKG: EKG Interpretation Date/Time:  Wednesday March 06 2024 21:22:26 EST Ventricular Rate:  61 PR Interval:  203 QRS Duration:  108 QT Interval:  446 QTC Calculation: 450 R Axis:   -57  Text Interpretation: Sinus rhythm Left anterior fascicular block Non-specific ST-t changes Confirmed by Bernard Drivers ([id number]) on 03/06/2024 9:39:37 PM  Radiology: [facility] Chest Port 1 View Result Date: 03/06/2024 EXAM: 1 VIEW(S) XRAY OF THE CHEST 03/06/2024 09:53:00 PM COMPARISON:  Comparison with 02/01/2024. CLINICAL HISTORY: SOB. FINDINGS: LUNGS AND PLEURA: Low lung volumes. Pulmonary vascularity is accentuated. Patchy airspace opacities in the right mid and lower lung with airspace opacities in the left lower lung. Small right pleural effusion. No pneumothorax. HEART AND MEDIASTINUM: Stable cardiomegaly. Aortic atherosclerotic calcification. The cardiomediastinal silhouette is accentuated. BONES AND SOFT TISSUES: No acute osseous abnormality. IMPRESSION: 1. Findings concerning for multifocal pneumonia. Small pleural effusion. Electronically signed by: Norman Gatlin MD 03/06/2024 09:59 PM EST RP Workstation: [id number]     Procedures   Medications Ordered in the ED  azithromycin (ZITHROMAX) 500 mg in sodium chloride  0.9 % 250 mL IVPB (500 mg Intravenous New Bag/Given 03/06/24 2305)  methylPREDNISolone  sodium succinate (SOLU-MEDROL ) 125 mg/2 mL injection 125 mg (125 mg Intravenous Given 03/06/24 2143)  albuterol  (PROVENTIL ) (2.5 MG/3ML) 0.083% nebulizer solution 5 mg (5 mg Nebulization Given 03/06/24 2141)  ipratropium (ATROVENT) nebulizer solution 0.5 mg (0.5 mg Nebulization Given 03/06/24 2141)  furosemide  (LASIX ) injection 40 mg (40 mg Intravenous Given 03/06/24 2142)  dextrose  50 % solution 25 g (25 g Intravenous Given 03/06/24 2257)  cefTRIAXone (ROCEPHIN) 1 g in sodium chloride  0.9 % 100 mL IVPB (1 g Intravenous New Bag/Given 03/06/24 2305)  lactated ringers bolus 250 mL (250 mLs Intravenous New Bag/Given 03/06/24 2303)                                    Medical Decision  Making Problems Addressed: Acute and chronic respiratory failure with hypoxia [facility]): acute illness or injury with systemic symptoms that poses a threat to life or bodily functions    Details: Acute/chronic Acute on chronic combined systolic and diastolic CHF (congestive heart failure) (HCC): acute illness or injury with systemic symptoms that poses a threat to life or bodily functions     Details: Acute/chronic AKI (acute kidney injury): acute illness or injury Class 3 obesity (HCC): chronic illness or injury with exacerbation, progression, or side effects of treatment that poses a threat to life or bodily functions Elevated brain natriuretic peptide (BNP) level: acute illness or injury    Details: Acute/chronic Elevated troponin: acute illness or injury    Details: Acute/chronic History of smoking: chronic illness or injury Hypoglycemia: acute illness or injury with systemic symptoms that poses a threat to life or bodily functions Stage 4 chronic kidney disease (HCC): chronic illness or injury with exacerbation, progression, or side effects of treatment that poses a threat to life or bodily functions Wheezing: acute illness or injury  Amount and/or Complexity of Data Reviewed Independent Historian: EMS External Data Reviewed: notes. Labs: ordered. Decision-making details documented in ED Course. Radiology: ordered and independent interpretation performed. Decision-making details documented in ED Course. ECG/medicine tests: ordered and independent interpretation performed. Decision-making details documented in ED Course. Discussion of management or test interpretation with external provider(s): medicine  Risk Prescription drug management. Decision regarding hospitalization.   Iv ns. Continuous pulse ox and cardiac monitoring. Labs ordered/sent. Imaging ordered.   Differential diagnosis includes chf, pna, copd, etc. Dispo decision including potential need for admission considered - will get labs and imaging and reassess.   Reviewed nursing notes and prior charts for additional history. External reports reviewed. Additional history from: EMS.   Solumedrol iv. Albuterol  and atrovent neb. Cbg ok, 199. Lasix  dose iv.   Cardiac monitor: sinus rhythm, rate 66.  Labs reviewed/interpreted by me - wbc 11, hgb 12. Aki. Glucose low. D50, po fluids/food. Trop elev - history  chronically elevated trop.  Denies chest pain. Delta trop pending. Still no chest pain or discomfort. Bnp very high - has received lasix .   Xrays reviewed/interpreted by me - ?infiltrate vs edema. Pt denies cough or fever. Bp is soft.  Will add cultures/lactate. Abx.   Hospitalists consulted for admission.  CRITICAL CARE RE: acute chr com chf, acute/chron resp failure, elevated bnp, elevated trop, soft blood pressure, aki.  Performed by: Deyona Soza E Kylin Dubs Total critical care time: 50 minutes Critical care time was exclusive of separately billable procedures and treating other patients. Critical care was necessary to treat or prevent imminent or life-threatening deterioration. Critical care was time spent personally by me on the following activities: development of treatment plan with patient and/or surrogate as well as nursing, discussions with consultants, evaluation of patient's response to treatment, examination of patient, obtaining history from patient or surrogate, ordering and performing treatments and interventions, ordering and review of laboratory studies, ordering and review of radiographic studies, pulse oximetry and re-evaluation of patient's condition.  Recheck pt, is breathing more comfortably, talking with full sentences. No chest pain or discomfort. Denies cough, denies fever/chills or sweats. No abd pain or nv, abd soft non tender.       Final diagnoses:  Acute and chronic respiratory failure with hypoxia (HCC)  Acute on chronic combined systolic and diastolic CHF (congestive heart failure) (HCC)  Wheezing  History of smoking  Class 3 obesity (HCC)  AKI (acute kidney injury)  Stage  4 chronic kidney disease (HCC)  Hypoglycemia  Elevated brain natriuretic peptide (BNP) level  Elevated troponin    ED Discharge Orders     None          Bernard Drivers, MD 03/06/24 2345

## 2024-03-06 NOTE — Progress Notes (Signed)
 Treatment given to patient.  BS are basically clear and diminished with slight wheeze auscultated in RUL.  Patient placed on 4L [place] with sat of 96%.

## 2024-03-06 NOTE — ED Triage Notes (Signed)
 [facility] from home cc of SOB. When EMS arrived he was 77% on room air.  Patient has a hx of CHF and resp failure. According to notes patient is supposed to be on 2L [place].  Patient was also 88/50 with a HR of 60. Placed on 15L [place] and came up to 100%.  BP went up to 110/70. Daughter gave 5 of albuterol  prior to EMS arrival.  CBG with ems was 38 so 1G of D10 was given.  Bilateral crackers +3 edema in lower legs.  20g IV placed in each [facility] by EMS.

## 2024-03-06 NOTE — Telephone Encounter (Signed)
 Name of Caller/Relationship to patient: luke fendt (daughter) Call back: [phone]  Reason for Call: at visit on 11/10 Oyxgen was ordered to administered at 2LPM via [place]. He started using it yesterday. Says he slept good and is really dizzy. Was supposed to see cardiologist this a.m. and they canceled it because he was dizzy. Also has a productive cough now. Yellow in Color. Denies fever.    Not able to check Pulse Ox at this time.   LOV: 03/06/2024  Provider: Hadassah Alt   Patient daughter warm transferred to [place].

## 2024-03-06 NOTE — ED Notes (Signed)
 Offered pt sandwich and water, pt refused saying he wasn't hungry.

## 2024-03-07 ENCOUNTER — Other Ambulatory Visit (HOSPITAL_COMMUNITY): Payer: Self-pay | Admitting: *Deleted

## 2024-03-07 ENCOUNTER — Inpatient Hospital Stay (HOSPITAL_COMMUNITY)

## 2024-03-07 DIAGNOSIS — N179 Acute kidney failure, unspecified: Secondary | ICD-10-CM | POA: Diagnosis present

## 2024-03-07 DIAGNOSIS — I2510 Atherosclerotic heart disease of native coronary artery without angina pectoris: Secondary | ICD-10-CM | POA: Diagnosis not present

## 2024-03-07 DIAGNOSIS — D696 Thrombocytopenia, unspecified: Secondary | ICD-10-CM | POA: Diagnosis present

## 2024-03-07 DIAGNOSIS — Z789 Other specified health status: Secondary | ICD-10-CM | POA: Diagnosis not present

## 2024-03-07 DIAGNOSIS — J9811 Atelectasis: Secondary | ICD-10-CM | POA: Diagnosis present

## 2024-03-07 DIAGNOSIS — J9601 Acute respiratory failure with hypoxia: Secondary | ICD-10-CM

## 2024-03-07 DIAGNOSIS — J9622 Acute and chronic respiratory failure with hypercapnia: Secondary | ICD-10-CM | POA: Diagnosis not present

## 2024-03-07 DIAGNOSIS — E119 Type 2 diabetes mellitus without complications: Secondary | ICD-10-CM | POA: Diagnosis not present

## 2024-03-07 DIAGNOSIS — E162 Hypoglycemia, unspecified: Secondary | ICD-10-CM | POA: Diagnosis not present

## 2024-03-07 DIAGNOSIS — I440 Atrioventricular block, first degree: Secondary | ICD-10-CM | POA: Diagnosis not present

## 2024-03-07 DIAGNOSIS — E11649 Type 2 diabetes mellitus with hypoglycemia without coma: Secondary | ICD-10-CM | POA: Diagnosis present

## 2024-03-07 DIAGNOSIS — I5043 Acute on chronic combined systolic (congestive) and diastolic (congestive) heart failure: Secondary | ICD-10-CM | POA: Diagnosis present

## 2024-03-07 DIAGNOSIS — G928 Other toxic encephalopathy: Secondary | ICD-10-CM | POA: Diagnosis not present

## 2024-03-07 DIAGNOSIS — J439 Emphysema, unspecified: Secondary | ICD-10-CM | POA: Diagnosis not present

## 2024-03-07 DIAGNOSIS — I10 Essential (primary) hypertension: Secondary | ICD-10-CM | POA: Insufficient documentation

## 2024-03-07 DIAGNOSIS — Z6841 Body Mass Index (BMI) 40.0 and over, adult: Secondary | ICD-10-CM | POA: Diagnosis not present

## 2024-03-07 DIAGNOSIS — J15212 Pneumonia due to Methicillin resistant Staphylococcus aureus: Secondary | ICD-10-CM | POA: Diagnosis not present

## 2024-03-07 DIAGNOSIS — Z4901 Encounter for fitting and adjustment of extracorporeal dialysis catheter: Secondary | ICD-10-CM | POA: Diagnosis not present

## 2024-03-07 DIAGNOSIS — D631 Anemia in chronic kidney disease: Secondary | ICD-10-CM | POA: Diagnosis not present

## 2024-03-07 DIAGNOSIS — D539 Nutritional anemia, unspecified: Secondary | ICD-10-CM

## 2024-03-07 DIAGNOSIS — R7989 Other specified abnormal findings of blood chemistry: Secondary | ICD-10-CM

## 2024-03-07 DIAGNOSIS — R578 Other shock: Secondary | ICD-10-CM

## 2024-03-07 DIAGNOSIS — I5A Non-ischemic myocardial injury (non-traumatic): Secondary | ICD-10-CM | POA: Diagnosis not present

## 2024-03-07 DIAGNOSIS — I130 Hypertensive heart and chronic kidney disease with heart failure and stage 1 through stage 4 chronic kidney disease, or unspecified chronic kidney disease: Secondary | ICD-10-CM | POA: Diagnosis not present

## 2024-03-07 DIAGNOSIS — I2489 Other forms of acute ischemic heart disease: Secondary | ICD-10-CM | POA: Diagnosis present

## 2024-03-07 DIAGNOSIS — R579 Shock, unspecified: Secondary | ICD-10-CM | POA: Diagnosis present

## 2024-03-07 DIAGNOSIS — R531 Weakness: Secondary | ICD-10-CM | POA: Diagnosis not present

## 2024-03-07 DIAGNOSIS — E1122 Type 2 diabetes mellitus with diabetic chronic kidney disease: Secondary | ICD-10-CM | POA: Diagnosis present

## 2024-03-07 DIAGNOSIS — J181 Lobar pneumonia, unspecified organism: Secondary | ICD-10-CM | POA: Diagnosis present

## 2024-03-07 DIAGNOSIS — Z1152 Encounter for screening for COVID-19: Secondary | ICD-10-CM | POA: Diagnosis not present

## 2024-03-07 DIAGNOSIS — I4581 Long QT syndrome: Secondary | ICD-10-CM | POA: Diagnosis not present

## 2024-03-07 DIAGNOSIS — E7800 Pure hypercholesterolemia, unspecified: Secondary | ICD-10-CM | POA: Diagnosis present

## 2024-03-07 DIAGNOSIS — I5031 Acute diastolic (congestive) heart failure: Secondary | ICD-10-CM | POA: Diagnosis not present

## 2024-03-07 DIAGNOSIS — E66813 Obesity, class 3: Secondary | ICD-10-CM | POA: Insufficient documentation

## 2024-03-07 DIAGNOSIS — R4182 Altered mental status, unspecified: Secondary | ICD-10-CM | POA: Diagnosis not present

## 2024-03-07 DIAGNOSIS — I4729 Other ventricular tachycardia: Secondary | ICD-10-CM | POA: Diagnosis not present

## 2024-03-07 DIAGNOSIS — I5023 Acute on chronic systolic (congestive) heart failure: Secondary | ICD-10-CM | POA: Diagnosis not present

## 2024-03-07 DIAGNOSIS — Z87891 Personal history of nicotine dependence: Secondary | ICD-10-CM | POA: Diagnosis not present

## 2024-03-07 DIAGNOSIS — N189 Chronic kidney disease, unspecified: Secondary | ICD-10-CM | POA: Diagnosis not present

## 2024-03-07 DIAGNOSIS — R062 Wheezing: Secondary | ICD-10-CM | POA: Diagnosis present

## 2024-03-07 DIAGNOSIS — I132 Hypertensive heart and chronic kidney disease with heart failure and with stage 5 chronic kidney disease, or end stage renal disease: Secondary | ICD-10-CM | POA: Diagnosis present

## 2024-03-07 DIAGNOSIS — N178 Other acute kidney failure: Secondary | ICD-10-CM

## 2024-03-07 DIAGNOSIS — J449 Chronic obstructive pulmonary disease, unspecified: Secondary | ICD-10-CM | POA: Diagnosis not present

## 2024-03-07 DIAGNOSIS — R569 Unspecified convulsions: Secondary | ICD-10-CM | POA: Diagnosis not present

## 2024-03-07 DIAGNOSIS — Z7984 Long term (current) use of oral hypoglycemic drugs: Secondary | ICD-10-CM | POA: Diagnosis not present

## 2024-03-07 DIAGNOSIS — N185 Chronic kidney disease, stage 5: Secondary | ICD-10-CM

## 2024-03-07 DIAGNOSIS — J440 Chronic obstructive pulmonary disease with acute lower respiratory infection: Secondary | ICD-10-CM | POA: Diagnosis present

## 2024-03-07 DIAGNOSIS — J188 Other pneumonia, unspecified organism: Secondary | ICD-10-CM | POA: Diagnosis not present

## 2024-03-07 DIAGNOSIS — Z7189 Other specified counseling: Secondary | ICD-10-CM | POA: Diagnosis not present

## 2024-03-07 DIAGNOSIS — E1165 Type 2 diabetes mellitus with hyperglycemia: Secondary | ICD-10-CM | POA: Diagnosis not present

## 2024-03-07 DIAGNOSIS — I5033 Acute on chronic diastolic (congestive) heart failure: Secondary | ICD-10-CM | POA: Diagnosis not present

## 2024-03-07 DIAGNOSIS — I480 Paroxysmal atrial fibrillation: Secondary | ICD-10-CM | POA: Diagnosis not present

## 2024-03-07 DIAGNOSIS — I498 Other specified cardiac arrhythmias: Secondary | ICD-10-CM | POA: Diagnosis not present

## 2024-03-07 DIAGNOSIS — R570 Cardiogenic shock: Secondary | ICD-10-CM | POA: Diagnosis not present

## 2024-03-07 DIAGNOSIS — I5030 Unspecified diastolic (congestive) heart failure: Secondary | ICD-10-CM | POA: Diagnosis not present

## 2024-03-07 DIAGNOSIS — I509 Heart failure, unspecified: Secondary | ICD-10-CM | POA: Insufficient documentation

## 2024-03-07 DIAGNOSIS — N184 Chronic kidney disease, stage 4 (severe): Secondary | ICD-10-CM

## 2024-03-07 DIAGNOSIS — Z515 Encounter for palliative care: Secondary | ICD-10-CM | POA: Diagnosis not present

## 2024-03-07 DIAGNOSIS — J9621 Acute and chronic respiratory failure with hypoxia: Secondary | ICD-10-CM | POA: Diagnosis present

## 2024-03-07 DIAGNOSIS — Z79899 Other long term (current) drug therapy: Secondary | ICD-10-CM | POA: Diagnosis not present

## 2024-03-07 DIAGNOSIS — Z7951 Long term (current) use of inhaled steroids: Secondary | ICD-10-CM | POA: Diagnosis not present

## 2024-03-07 DIAGNOSIS — N186 End stage renal disease: Secondary | ICD-10-CM | POA: Diagnosis not present

## 2024-03-07 LAB — RESPIRATORY PANEL BY PCR

## 2024-03-07 LAB — COMPREHENSIVE METABOLIC PANEL WITH GFR
ALT: 18 U/L (ref 0–44)
AST: 23 U/L (ref 15–41)
Albumin: 3.7 g/dL (ref 3.5–5.0)
Alkaline Phosphatase: 88 U/L (ref 38–126)
Anion gap: 13 (ref 5–15)
BUN: 51 mg/dL — ABNORMAL HIGH (ref 8–23)
CO2: 26 mmol/L (ref 22–32)
Calcium: 8.8 mg/dL — ABNORMAL LOW (ref 8.9–10.3)
Chloride: 101 mmol/L (ref 98–111)
Creatinine, Ser: 4.58 mg/dL — ABNORMAL HIGH (ref 0.61–1.24)
GFR, Estimated: 12 mL/min — ABNORMAL LOW (ref >60)
Glucose, Bld: 70 mg/dL (ref 70–99)
Potassium: 4.1 mmol/L (ref 3.5–5.1)
Sodium: 141 mmol/L (ref 135–145)
Total Bilirubin: 0.3 mg/dL (ref 0.0–1.2)
Total Protein: 6.7 g/dL (ref 6.5–8.1)

## 2024-03-07 LAB — MAGNESIUM: Magnesium: 2.6 mg/dL — ABNORMAL HIGH (ref 1.7–2.4)

## 2024-03-07 LAB — ECHOCARDIOGRAM LIMITED
Area-P 1/2: 4.89 cm2
Height: 69 in
S' Lateral: 2.4 cm
Weight: 4807.79 [oz_av]

## 2024-03-07 LAB — TROPONIN T, HIGH SENSITIVITY
Troponin T High Sensitivity: 247 ng/L (ref 0–19)
Troponin T High Sensitivity: 247 ng/L (ref 0–19)

## 2024-03-07 LAB — CBC
HCT: 39.3 % (ref 39.0–52.0)
Hemoglobin: 11.7 g/dL — ABNORMAL LOW (ref 13.0–17.0)
MCH: 32.6 pg (ref 26.0–34.0)
MCHC: 29.8 g/dL — ABNORMAL LOW (ref 30.0–36.0)
MCV: 109.5 fL — ABNORMAL HIGH (ref 80.0–100.0)
Platelets: 121 K/uL — ABNORMAL LOW (ref 150–400)
RBC: 3.59 MIL/uL — ABNORMAL LOW (ref 4.22–5.81)
RDW: 14.3 % (ref 11.5–15.5)
WBC: 10.5 K/uL (ref 4.0–10.5)
nRBC: 0 % (ref 0.0–0.2)

## 2024-03-07 LAB — FOLATE: Folate: 12.2 ng/mL (ref >5.9)

## 2024-03-07 LAB — PHOSPHORUS: Phosphorus: 5.7 mg/dL — ABNORMAL HIGH (ref 2.5–4.6)

## 2024-03-07 LAB — GLUCOSE, CAPILLARY
Glucose-Capillary: 99 mg/dL (ref 70–99)
Glucose-Capillary: 163 mg/dL — ABNORMAL HIGH (ref 70–99)
Glucose-Capillary: 118 mg/dL — ABNORMAL HIGH (ref 70–99)
Glucose-Capillary: 138 mg/dL — ABNORMAL HIGH (ref 70–99)

## 2024-03-07 LAB — CBG MONITORING, ED: Glucose-Capillary: 89 mg/dL (ref 70–99)

## 2024-03-07 LAB — MRSA NEXT GEN BY PCR, NASAL: MRSA by PCR Next Gen: NOT DETECTED

## 2024-03-07 LAB — VITAMIN B12: Vitamin B-12: 2517 pg/mL — ABNORMAL HIGH (ref 180–914)

## 2024-03-07 LAB — PROCALCITONIN: Procalcitonin: 0.13 ng/mL

## 2024-03-07 MED ORDER — SODIUM CHLORIDE 0.9% FLUSH: 10.0000 mL | INTRAVENOUS | Status: DC | PRN

## 2024-03-07 MED ORDER — SODIUM CHLORIDE 0.9 % IV SOLN: 250.0000 mL | INTRAVENOUS | Status: AC

## 2024-03-07 MED ORDER — PIPERACILLIN-TAZOBACTAM IN DEX 2-0.25 GM/50ML IV SOLN
2.2500 g | Freq: Three times a day (TID) | INTRAVENOUS | Status: DC
  Filled 2024-03-07 (×4): qty 50

## 2024-03-07 MED ORDER — GUAIFENESIN-DM 100-10 MG/5ML PO SYRP
15.0000 mL | ORAL_SOLUTION | ORAL | Status: DC | PRN
  Administered 2024-03-07 – 2024-03-08 (×2): 15 mL via ORAL
  Filled 2024-03-07 (×2): qty 15

## 2024-03-07 MED ORDER — SODIUM CHLORIDE 0.9 % IV SOLN
2.2500 g | Freq: Three times a day (TID) | INTRAVENOUS | Status: DC
  Filled 2024-03-07 (×4): qty 10

## 2024-03-07 MED ORDER — MIDODRINE HCL 5 MG PO TABS
10.0000 mg | ORAL_TABLET | Freq: Three times a day (TID) | ORAL | Status: DC
  Administered 2024-03-07 – 2024-03-08 (×4): 10 mg via ORAL
  Filled 2024-03-07 (×4): qty 2

## 2024-03-07 MED ORDER — ACETAMINOPHEN 325 MG PO TABS: 650.0000 mg | ORAL_TABLET | Freq: Four times a day (QID) | ORAL | Status: DC | PRN

## 2024-03-07 MED ORDER — SODIUM CHLORIDE 0.9% FLUSH
10.0000 mL | Freq: Two times a day (BID) | INTRAVENOUS | Status: DC
  Administered 2024-03-07 – 2024-03-08 (×3): 10 mL

## 2024-03-07 MED ORDER — ONDANSETRON HCL 4 MG PO TABS: 4.0000 mg | ORAL_TABLET | Freq: Four times a day (QID) | ORAL | Status: DC | PRN

## 2024-03-07 MED ORDER — ROSUVASTATIN CALCIUM 10 MG PO TABS
10.0000 mg | ORAL_TABLET | Freq: Every day | ORAL | Status: DC
  Administered 2024-03-07 – 2024-03-08 (×2): 10 mg via ORAL
  Filled 2024-03-07 (×2): qty 1

## 2024-03-07 MED ORDER — HYDROCORTISONE SOD SUC (PF) 100 MG IJ SOLR
100.0000 mg | Freq: Three times a day (TID) | INTRAMUSCULAR | Status: DC
  Administered 2024-03-07 – 2024-03-08 (×4): 100 mg via INTRAVENOUS
  Filled 2024-03-07 (×4): qty 2

## 2024-03-07 MED ORDER — HEPARIN SODIUM (PORCINE) 5000 UNIT/ML IJ SOLN
5000.0000 [IU] | Freq: Three times a day (TID) | INTRAMUSCULAR | Status: DC
  Administered 2024-03-07 – 2024-03-08 (×4): 5000 [IU] via SUBCUTANEOUS
  Filled 2024-03-07 (×4): qty 1

## 2024-03-07 MED ORDER — CHLORHEXIDINE GLUCONATE CLOTH 2 % EX PADS
6.0000 | MEDICATED_PAD | Freq: Every day | CUTANEOUS | Status: DC
  Administered 2024-03-07 – 2024-03-08 (×2): 6 via TOPICAL

## 2024-03-07 MED ORDER — ONDANSETRON HCL 4 MG/2ML IJ SOLN: 4.0000 mg | Freq: Four times a day (QID) | INTRAMUSCULAR | Status: DC | PRN

## 2024-03-07 MED ORDER — DEXTROSE 10 % IV SOLN: INTRAVENOUS | Status: DC

## 2024-03-07 MED ORDER — PERFLUTREN LIPID MICROSPHERE
1.0000 mL | INTRAVENOUS | Status: AC | PRN
  Administered 2024-03-07: 3 mL via INTRAVENOUS

## 2024-03-07 MED ORDER — ENOXAPARIN SODIUM 30 MG/0.3ML IJ SOSY
30.0000 mg | PREFILLED_SYRINGE | INTRAMUSCULAR | Status: DC
  Filled 2024-03-07: qty 0.3

## 2024-03-07 MED ORDER — SERTRALINE HCL 50 MG PO TABS
25.0000 mg | ORAL_TABLET | Freq: Every day | ORAL | Status: DC
  Administered 2024-03-07 – 2024-03-08 (×2): 25 mg via ORAL
  Filled 2024-03-07 (×2): qty 1

## 2024-03-07 MED ORDER — NOREPINEPHRINE 4 MG/250ML-% IV SOLN
0.0000 ug/min | INTRAVENOUS | Status: DC
  Administered 2024-03-07 – 2024-03-08 (×2): 2 ug/min via INTRAVENOUS
  Filled 2024-03-07 (×2): qty 250

## 2024-03-07 MED ORDER — ACETAMINOPHEN 650 MG RE SUPP
650.0000 mg | Freq: Four times a day (QID) | RECTAL | Status: DC | PRN
Start: 2024-03-07 — End: 2024-03-08

## 2024-03-07 NOTE — Progress Notes (Signed)
 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  Patient denies any fevers, chills, chest pain.  He complains of orthopnea and mild increase of his lower extremity edema. The patient was recently admitted to the hospital from 02/01/2024 to 02/05/2024 for respiratory failure due to CHF.  The patient did not require oxygen  at the time of his discharge.  His discharge weight was 286.  At his last office visit with his primary provider his weight was 291 pounds on 02/22/24.  Follow up with pulmonary on 03/04/24 office weight 29.   He was discharged home with furosemide  40 mg daily during last hospitalization.  Since the patient is discharged he has followed up with pulmonary medicine at [facility] on 03/04/2024.   He has a history of smoking several packs of cigars daily during his youth, transitioning from cigarettes to cigars, and has abstained from smoking for the past 15 years.  It was noted the patient had a CPAP machine he has not used in 4 years. The patient was set up with home oxygen  2 L during that appointment.  He was noted to have worsening lower extremity edema during that appointment.  He was prescribed Symbicort.  His weight during that appointment was 297 pounds.  In the ED, the patient was afebrile and hemodynamically stable.  Oxygen  saturation was 93-97% on 4 L.  WBC 11.0, hemoglobin 12.3, platelets 138.  Sodium 141, potassium 4.1, bicarbonate 31, serum creatinine 4.15.  proBNP 16,139.  PCT 0.13.  Lactic acid 1.9 >> 0.8.  COVID-negative chest x-ray showed patchy opacities right lower lobe and left lower lobe.  The patient was started furosemide  40 mg IV, Solu-Medrol  125 mg, ceftriaxone, and azithromycin.  He was given albuterol   and Atrovent.   Assessment/Plan: Acute on chronic respiratory failure with hypoxia - Chronically on 2 L at home - Currently on 4 L - Secondary to pneumonia and fluid overload - Wean oxygen  as tolerated back to baseline - CT chest  Acute on chronic HFpEF - 02/02/2024 echo EF 60 to 65%, normal RVF, grade 3 DD - Remains fluid overloaded - Continue IV furosemide   Acute on chronic renal failure--CKD stage IV - Baseline creatinine 2.5-2.8 - Nephrology consult appreciated - discussed with Dr. Marlee - Continue IV furosemide  for now  Lobar pneumonia - Continue ceftriaxone and azithromycin - PCT 0.13 - CT chest -COVID/RSV/Flu neg -viral respiratory panel  Elevated troponin - Secondary to demand ischemia - No chest pain presently - Troponin trend is flat  Diabetes mellitus type 2 with hyperglycemia - Patient was on Amaryl in the outpatient setting - Do not plan to restart Amaryl at this point - Check CBGs - NovoLog  sliding scale - on D10W temporarily  Morbid obesity - BMI 44.37 - Lifestyle modification  Essential hypertension - Holding metoprolol  succinate, lisinopril at this point secondary to soft BPs  Thrombocytopenia - Appears to be chronic - B12 - Folic acid - TSH  COPD -FEV1  03/04/2024 0.98 L   FEV1/FVC  03/04/2024 72 %  - Continue Symbicort or therapeutic equivalent         Family Communication:   DTR at bedside 11/13  Consultants:  renal  Code Status:  FULL  DVT Prophylaxis:   Heparin    Procedures: As Listed in Progress Note Above  Antibiotics: None   The patient is critically ill with multiple organ systems failure and requires high complexity decision making for assessment and support, frequent evaluation and titration of therapies, application of advanced monitoring technologies and extensive interpretation of multiple databases.  Critical care time - 40 mins.    Subjective: Patient denies fevers, chills, headache, chest  pain, dyspnea, nausea, vomiting, diarrhea, abdominal pain, dysuria, hematuria, hematochezia, and melena.   Objective: Vitals:   03/07/24 0600 03/07/24 0729 03/07/24 0808 03/07/24 0809  BP: (!) 108/55   110/62  Pulse: 62  61 61  Resp: (!) 23  (!) 22 (!) 23  Temp:  97.7 F (36.5 C)    TempSrc:  Axillary    SpO2: 97%  98% 97%  Weight:      Height:        Intake/Output Summary (Last 24 hours) at 03/07/2024 0829 Last data filed at 03/07/2024 9187 Gross per 24 hour  Intake 1090 ml  Output 75 ml  Net 1015 ml   Weight change:  Exam:  General:  Pt is alert, follows commands appropriately, not in acute distress HEENT: No icterus, No thrush, No neck mass, [place]/AT Cardiovascular: RRR, S1/S2, no rubs, no gallops Respiratory: bibasilar crackles.  No wheeze Abdomen: Soft/+BS, non tender, non distended, no guarding Extremities: 2 +LE edema, No lymphangitis, No petechiae, No rashes, no synovitis   Data Reviewed: I have personally reviewed following labs and imaging studies Basic Metabolic Panel: Recent Labs  Lab 03/06/24 2129 03/07/24 0442  NA 141 141  K 4.1 4.1  CL 102 101  CO2 31 26  GLUCOSE 48* 70  BUN 46* 51*  CREATININE 4.15* 4.58*  CALCIUM  9.0 8.8*  MG  --  2.6*  PHOS  --  5.7*   Liver Function Tests: Recent Labs  Lab 03/06/24 2129 03/07/24 0442  AST 20 23  ALT 18 18  ALKPHOS 91 88  BILITOT 0.3 0.3  PROT 6.7 6.7  ALBUMIN 3.7 3.7   No results for input(s): LIPASE, AMYLASE in the last 168 hours. No results for input(s): AMMONIA in the last 168 hours. Coagulation Profile: No results for input(s): INR, PROTIME in the last 168 hours. CBC: Recent Labs  Lab 03/06/24 2129 03/07/24 0442  WBC 11.0* 10.5  HGB 12.3* 11.7*  HCT 40.9 39.3  MCV 108.8* 109.5*  PLT 138* 121*   Cardiac Enzymes: No results for input(s): CKTOTAL, CKMB, CKMBINDEX, TROPONINI in the last 168 hours. BNP: Invalid input(s): POCBNP CBG: Recent Labs  Lab 03/06/24 2122  03/06/24 2256 03/06/24 2331 03/07/24 0011  GLUCAP 199* 35* 93 89   HbA1C: No results for input(s): HGBA1C in the last 72 hours. Urine analysis: No results found for: COLORURINE, APPEARANCEUR, LABSPEC, PHURINE, GLUCOSEU, HGBUR, BILIRUBINUR, KETONESUR, PROTEINUR, UROBILINOGEN, NITRITE, LEUKOCYTESUR Sepsis Labs: @LABRCNTIP (procalcitonin:4,lacticidven:4) ) Recent Results (from the past 240 hours)  Blood culture (routine x 2)     Status: None (Preliminary result)   Collection Time: 03/06/24  9:29 PM   Specimen: BLOOD  Result Value Ref Range Status   Specimen Description BLOOD RIGHT ANTECUBITAL  Final   Special Requests   Final    BOTTLES DRAWN AEROBIC AND ANAEROBIC Blood Culture adequate volume   Culture   Final    NO GROWTH < 12 HOURS Performed at [facility], [address], [place], [place] [address]    Report Status PENDING  Incomplete  Resp panel by RT-PCR (RSV, Flu  A&B, Covid) Anterior Nasal Swab     Status: None   Collection Time: 03/06/24  9:33 PM   Specimen: Anterior Nasal Swab  Result Value Ref Range Status   SARS Coronavirus 2 by RT PCR NEGATIVE NEGATIVE Final    Comment: (NOTE) SARS-CoV-2 target nucleic acids are NOT DETECTED.  The SARS-CoV-2 RNA is generally detectable in upper respiratory specimens during the acute phase of infection. The lowest concentration of SARS-CoV-2 viral copies this assay can detect is 138 copies/mL. A negative result does not preclude SARS-Cov-2 infection and should not be used as the sole basis for treatment or other patient management decisions. A negative result may occur with  improper specimen collection/handling, submission of specimen other than nasopharyngeal swab, presence of viral mutation(s) within the areas targeted by this assay, and inadequate number of viral copies(<138 copies/mL). A negative result must be combined with clinical observations, patient history, and epidemiological information. The  expected result is Negative.  Fact Sheet for Patients:  bloggercourse.com  Fact Sheet for Healthcare Providers:  seriousbroker.it  This test is no t yet approved or cleared by the [place]  FDA and  has been authorized for detection and/or diagnosis of SARS-CoV-2 by FDA under an Emergency Use Authorization (EUA). This EUA will remain  in effect (meaning this test can be used) for the duration of the COVID-19 declaration under Section 564(b)(1) of the Act, 21 U.S.C.section 360bbb-3(b)(1), unless the authorization is terminated  or revoked sooner.       Influenza A by PCR NEGATIVE NEGATIVE Final   Influenza B by PCR NEGATIVE NEGATIVE Final    Comment: (NOTE) The Xpert Xpress SARS-CoV-2/FLU/RSV plus assay is intended as an aid in the diagnosis of influenza from Nasopharyngeal swab specimens and should not be used as a sole basis for treatment. Nasal washings and aspirates are unacceptable for Xpert Xpress SARS-CoV-2/FLU/RSV testing.  Fact Sheet for Patients: bloggercourse.com  Fact Sheet for Healthcare Providers: seriousbroker.it  This test is not yet approved or cleared by the [place]  FDA and has been authorized for detection and/or diagnosis of SARS-CoV-2 by FDA under an Emergency Use Authorization (EUA). This EUA will remain in effect (meaning this test can be used) for the duration of the COVID-19 declaration under Section 564(b)(1) of the Act, 21 U.S.C. section 360bbb-3(b)(1), unless the authorization is terminated or revoked.     Resp Syncytial Virus by PCR NEGATIVE NEGATIVE Final    Comment: (NOTE) Fact Sheet for Patients: bloggercourse.com  Fact Sheet for Healthcare Providers: seriousbroker.it  This test is not yet approved or cleared by the [place]  FDA and has been authorized for detection and/or  diagnosis of SARS-CoV-2 by FDA under an Emergency Use Authorization (EUA). This EUA will remain in effect (meaning this test can be used) for the duration of the COVID-19 declaration under Section 564(b)(1) of the Act, 21 U.S.C. section 360bbb-3(b)(1), unless the authorization is terminated or revoked.  Performed at [facility], [address], [place], [place] [address]   Blood culture (routine x 2)     Status: None (Preliminary result)   Collection Time: 03/06/24 10:55 PM   Specimen: BLOOD  Result Value Ref Range Status   Specimen Description BLOOD BLOOD RIGHT HAND  Final   Special Requests   Final    BOTTLES DRAWN AEROBIC AND ANAEROBIC Blood Culture adequate volume   Culture   Final    NO GROWTH < 12 HOURS Performed at [facility], [address], [place], [place] [address]  Report Status PENDING  Incomplete     Scheduled Meds:  Chlorhexidine Gluconate Cloth  6 each Topical Daily   enoxaparin (LOVENOX) injection  30 mg Subcutaneous Q24H   Continuous Infusions:  dextrose  30 mL/hr at 03/07/24 0020    Procedures/Studies: DG Chest Port 1 View Result Date: 03/06/2024 EXAM: 1 VIEW(S) XRAY OF THE CHEST 03/06/2024 09:53:00 PM COMPARISON: Comparison with 02/01/2024. CLINICAL HISTORY: SOB. FINDINGS: LUNGS AND PLEURA: Low lung volumes. Pulmonary vascularity is accentuated. Patchy airspace opacities in the right mid and lower lung with airspace opacities in the left lower lung. Small right pleural effusion. No pneumothorax. HEART AND MEDIASTINUM: Stable cardiomegaly. Aortic atherosclerotic calcification. The cardiomediastinal silhouette is accentuated. BONES AND SOFT TISSUES: No acute osseous abnormality. IMPRESSION: 1. Findings concerning for multifocal pneumonia. Small pleural effusion. Electronically signed by: Norman Gatlin MD 03/06/2024 09:59 PM EST RP Workstation: [id number]    Alm Schneider, DO  [facility]  If 7PM-7AM, please contact  night-coverage [email or web address] Password [email or web address] 03/07/2024, 8:29 AM   LOS: 0 days

## 2024-03-07 NOTE — Hospital Course (Addendum)
 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  Patient denies any fevers, chills, chest pain.  He complains of orthopnea and mild increase of his lower extremity edema. The patient was recently admitted to the hospital from 02/01/2024 to 02/05/2024 for respiratory failure due to CHF.  The patient did not require oxygen  at the time of his discharge.  His discharge weight was 286.  At his last office visit with his primary provider his weight was 291 pounds on 02/22/24.  Follow up with pulmonary on 03/04/24 office weight 29.   He was discharged home with furosemide  40 mg daily during last hospitalization.  Since the patient is discharged he has followed up with pulmonary medicine at [facility] on 03/04/2024.   He has a history of smoking several packs of cigars daily during his youth, transitioning from cigarettes to cigars, and has abstained from smoking for the past 15 years.  It was noted the patient had a CPAP machine he has not used in 4 years. The patient was set up with home oxygen  2 L during that appointment.  He was noted to have worsening lower extremity edema during that appointment.  He was prescribed Symbicort.  His weight during that appointment was 297 pounds.  In the ED, the patient was afebrile and hemodynamically stable.  Oxygen  saturation was 93-97% on 4 L.  WBC 11.0, hemoglobin 12.3, platelets 138.  Sodium 141, potassium 4.1, bicarbonate 31, serum creatinine 4.15.  proBNP 16,139.  PCT 0.13.  Lactic acid 1.9 >> 0.8.  COVID-negative chest x-ray showed patchy opacities right lower lobe and left lower lobe.  The patient was started furosemide  40 mg IV, Solu-Medrol  125 mg, ceftriaxone, and azithromycin.  He was given albuterol  and Atrovent. The patient gradually became hypotensive.  The patient was started on low-dose Levophed 3-5 mcg/kg/min.  Nephrology was  consulted.  PCCM was consulted.  The patient became oliguric.  Renal ultrasound was negative for obstruction.  Case was discussed with nephrology and it was felt the patient need to be initiated on CRRT.  Subsequently, initial request was made to transfer the patient to [facility].  In order to expedite care, the patient will be transferred to [facility] regional ICU to critical care service where nephrology will be consulted for continued management of his renal failure and fluid overload.

## 2024-03-07 NOTE — TOC CM/SW Note (Signed)
 Transition of Care [facility]) - Inpatient Brief Assessment   Patient Details  Name: Dave Brown MRN: [id number] Date of Birth: December 02, 1945  Transition of Care [facility]) CM/SW Contact:    Lucie Lunger, LCSWA Phone Number: 03/07/2024, 9:17 AM   Clinical Narrative: Transition of Care Department [facility]) has reviewed patient and no TOC needs have been identified at this time. We will continue to monitor patient advancement through interdiciplinary progression rounds. If new patient transition needs arise, please place a TOC consult.  Transition of Care Asessment: Insurance and Status: Insurance coverage has been reviewed Patient has primary care physician: Yes Home environment has been reviewed: From home Prior level of function:: Independent Prior/Current Home Services: No current home services Social Drivers of Health Review: SDOH reviewed no interventions necessary Readmission risk has been reviewed: Yes Transition of care needs: no transition of care needs at this time

## 2024-03-07 NOTE — Consult Note (Addendum)
 TELE-PCCM CONSULT This consult was provided via telemedicine with 2-way video and audio communication.  Physician location: [facility] Patient location: [facility]  NAME:  Dave Brown, MRN:  [id number], DOB:  March 26, 1946, LOS: 0 ADMISSION DATE:  03/06/2024, CONSULTATION DATE:  03/07/24 REFERRING MD:  EDP, CHIEF COMPLAINT:  Edema   History of Present Illness:  78 year old man HFpEF, CKD4 presenting with worsening SOB, orthopnea, weight gain.  Workup reveals acute on chronic renal injury, questionable multifocal PNA but CT and Pct argue against.  BP are marginal and needs diuresis attempt.  PCCM to consult.    Pertinent  Medical History   Past Medical History:  Diagnosis Date   CHF (congestive heart failure) (HCC)    Degenerative joint disease    High cholesterol    Hypertension    Kidney stones    Obesity    Respiratory failure with hypoxia (HCC)    Sleep apnea      Significant Hospital Events: Including procedures, antibiotic start and stop dates in addition to other pertinent events   03/07/24  Interim History / Subjective:  consult  Objective   Blood pressure (!) 82/50, pulse 65, temperature 98.7 F (37.1 C), temperature source Oral, resp. rate (!) 22, height 5' 9 (1.753 m), weight (!) 136.3 kg, SpO2 94%.        Intake/Output Summary (Last 24 hours) at 03/07/2024 1351 Last data filed at 03/07/2024 1200 Gross per 24 hour  Intake 1330 ml  Output 100 ml  Net 1230 ml   Filed Weights   03/06/24 2127 03/07/24 0209  Weight: 133 kg (!) 136.3 kg    Examination: No distress Mentating okay Mild tachypnea  Lab/imaging review: CT, BMP, CBC, prior echo (syngo images reviewed)  Resolved Hospital Problem list   N/A  Assessment & Plan:  AKI on CKD  Low BP- no acidemia or gap to indicate shock state, last echo okay except diastolic dysfunction, pulse pressure preserved, doubt low flow state but can check echo just in case; no s/s of septic shock;  sugars are low but suspect more related to oral antiglycemics plus AKI, 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  Renal US  Midodrine  10mg  TID Levo for MAP 65 Check cortisol; start hydrocortisone  in interim; if cortisol > 20 can DC If no obstruction would do high dose diuretic trial if nephro agrees Otherwise may be heading for need for HD Check limited echo DC abx Will follow with you  Best Practice (right click and Reselect all SmartList Selections daily)   Diet/type: Regular consistency (see orders) DVT prophylaxis: prophylactic heparin   GI prophylaxis: N/A Lines: N/A Foley:  Yes, and it is still needed Code Status:  full code Last date of multidisciplinary goals of care discussion [per primary]  Labs   CBC: Recent Labs  Lab 03/06/24 2129 03/07/24 0442  WBC 11.0* 10.5  HGB 12.3* 11.7*  HCT 40.9 39.3  MCV 108.8* 109.5*  PLT 138* 121*    Basic Metabolic Panel: Recent Labs  Lab 03/06/24 2129 03/07/24 0442  NA 141 141  K 4.1 4.1  CL 102 101  CO2 31 26  GLUCOSE 48* 70  BUN 46* 51*  CREATININE 4.15* 4.58*  CALCIUM  9.0 8.8*  MG  --  2.6*  PHOS  --  5.7*   GFR: Estimated Creatinine Clearance: 18.2 mL/min (A) (by C-G formula based on SCr of 4.58 mg/dL (H)). Recent Labs  Lab 03/06/24 2129 03/06/24 2255 03/07/24 0442  PROCALCITON  --   --  0.13  WBC 11.0*  --  10.5  LATICACIDVEN 1.0 0.8  --     Liver Function Tests: Recent Labs  Lab 03/06/24 2129 03/07/24 0442  AST 20 23  ALT 18 18  ALKPHOS 91 88  BILITOT 0.3 0.3  PROT 6.7 6.7  ALBUMIN 3.7 3.7   No results for input(s): LIPASE, AMYLASE in the last 168 hours. No results for input(s): AMMONIA in the last 168 hours.  ABG No results found for: PHART, PCO2ART, PO2ART, HCO3, TCO2, ACIDBASEDEF, O2SAT   Coagulation Profile: No results for input(s): INR, PROTIME in the last 168 hours.  Cardiac Enzymes: No results for input(s): CKTOTAL, CKMB, CKMBINDEX, TROPONINI in the  last 168 hours.  HbA1C: Hgb A1c MFr Bld  Date/Time Value Ref Range Status  05/02/2020 02:37 PM 6.9 (H) 4.8 - 5.6 % Final    Comment:    (NOTE) Pre diabetes:          5.7%-6.4%  Diabetes:              >6.4%  Glycemic control for   <7.0% adults with diabetes     CBG: Recent Labs  Lab 03/06/24 2256 03/06/24 2331 03/07/24 0011 03/07/24 0833 03/07/24 1141  GLUCAP 35* 93 89 99 163*    Review of Systems:   Per HPI otherwise neg  Past Medical History:  He,  has a past medical history of CHF (congestive heart failure) (HCC), Degenerative joint disease, High cholesterol, Hypertension, Kidney stones, Obesity, Respiratory failure with hypoxia (HCC), and Sleep apnea.   Surgical History:  History reviewed. No pertinent surgical history.   Social History:   reports that he quit smoking about 25 years ago. His smoking use included cigars. He does not have any smokeless tobacco history on file. He reports that he does not drink alcohol and does not use drugs.   Family History:  His family history is not on file.   Allergies No Known Allergies   Home Medications  Prior to Admission medications   Medication Sig Start Date End Date Taking? Authorizing Provider  albuterol  (PROVENTIL ) (2.5 MG/3ML) 0.083% nebulizer solution Take 2.5 mg by nebulization every 6 (six) hours as needed for shortness of breath or wheezing. 03/04/24   [provider]  budesonide -formoterol (SYMBICORT) 160-4.5 MCG/ACT inhaler Inhale 2 puffs into the lungs in the morning and at bedtime. 03/04/24 03/04/25  [provider]  furosemide  (LASIX ) 40 MG tablet Take 1 tablet (40 mg total) by mouth daily. PT. MUST MAKE AN APPOINTMENT IN ORDER TO RECEIVE FUTURE REFILLS. FIRST ATTEMPT. Patient taking differently: Take 40 mg by mouth in the morning. 09/20/23   [name], [name], MD  glimepiride (AMARYL) 1 MG tablet Take 1 mg by mouth daily with breakfast.    [provider]   Ipratropium-Albuterol  (COMBIVENT ) 20-100 MCG/ACT AERS respimat Inhale 1 puff into the lungs 2 (two) times daily. 05/03/20   [name], [name], MD  lisinopril (ZESTRIL) 10 MG tablet Take 10 mg by mouth daily. 01/03/22   [provider]  metoprolol  succinate (TOPROL -XL) 50 MG 24 hr tablet Take 50 mg by mouth in the morning. 05/27/12   [provider]  rosuvastatin  (CRESTOR ) 10 MG tablet Take 10 mg by mouth daily.    [provider]  sertraline  (ZOLOFT ) 25 MG tablet Take 25 mg by mouth in the morning.    [provider]  TYLENOL  325 MG tablet Take 325-650 mg by mouth every 8 (eight) hours as needed for mild pain (pain score  1-3) (or headaches).    [provider]     Total time: 31 min cc time     Toribio JAYSON Sharps, MD 03/07/24 1:51 PM [facility]  For contact information, see Amion. If no response to pager, please call PCCM consult pager. After hours, 7PM- 7AM, please call Elink.

## 2024-03-07 NOTE — H&P (Signed)
 History and Physical    Patient: Dave Brown FMW:[id number] DOB: [birth year] DOA: 03/06/2024 DOS: the patient was seen and examined on 03/07/2024 PCP: Debrah Josette ORN., PA-C  Patient coming from: Home  Chief Complaint:  Chief Complaint  Patient presents with   Shortness of Breath   HPI: Dave Brown is a 78 y.o. male with medical history significant of HFpEF, chronic close as a education administrator, hypertension, hyperlipidemia, CKD stage IV, obesity class III, OSA who presents to the emergency department from home via EMS due to 1 day onset of increased shortness of breath.  She states that she has been compliant with her medications except CPAP.  ED course In the emergency department, patient was tachypneic, HR 64 bpm, BP 95/54, temperature 98.4 F, O2 sats 97% on 5 LPM of oxygen .  Workup in the ED showed WBC 11.0, hemoglobin 12.3, hematocrit 40.9, MCV 108.8, platelets 138.  BMP was normal except for glucose of 48, BUN 46, creatinine 4.15 (baseline creatinine at 2.5-2.9).  Troponin 178 > 196, proBNP 16,139.  Respiratory panel was negative. Chest x-ray showed findings concerning for multifocal pneumonia.  Small pleural effusion. Empiric ceftriaxone and azithromycin were given, breathing treatment was provided and IV Lasix  40 mg x 1 was given.   Review of Systems: As mentioned in the history of present illness. All other systems reviewed and are negative. Past Medical History:  Diagnosis Date   CHF (congestive heart failure) (HCC)    Degenerative joint disease    High cholesterol    Hypertension    Kidney stones    Obesity    Respiratory failure with hypoxia (HCC)    Sleep apnea    History reviewed. No pertinent surgical history. Social History:  reports that he quit smoking about 25 years ago. His smoking use included cigars. He does not have any smokeless tobacco history on file. He reports that he does not drink alcohol and does not use drugs.  No Known Allergies  History reviewed. No  pertinent family history.  Prior to Admission medications   Medication Sig Start Date End Date Taking? Authorizing Provider  albuterol  (PROVENTIL ) (2.5 MG/3ML) 0.083% nebulizer solution Take 2.5 mg by nebulization every 6 (six) hours as needed for shortness of breath or wheezing. 03/04/24   [provider]  budesonide-formoterol (SYMBICORT) 160-4.5 MCG/ACT inhaler Inhale 2 puffs into the lungs in the morning and at bedtime. 03/04/24 03/04/25  [provider]  furosemide  (LASIX ) 40 MG tablet Take 1 tablet (40 mg total) by mouth daily. PT. MUST MAKE AN APPOINTMENT IN ORDER TO RECEIVE FUTURE REFILLS. FIRST ATTEMPT. Patient taking differently: Take 40 mg by mouth in the morning. 09/20/23   [name], [name], MD  glimepiride (AMARYL) 1 MG tablet Take 1 mg by mouth daily with breakfast.    [provider]  Ipratropium-Albuterol  (COMBIVENT ) 20-100 MCG/ACT AERS respimat Inhale 1 puff into the lungs 2 (two) times daily. 05/03/20   [name], [name], MD  lisinopril (ZESTRIL) 10 MG tablet Take 10 mg by mouth daily. 01/03/22   [provider]  metoprolol  succinate (TOPROL -XL) 50 MG 24 hr tablet Take 50 mg by mouth in the morning. 05/27/12   [provider]  rosuvastatin  (CRESTOR ) 10 MG tablet Take 10 mg by mouth daily.    [provider]  sertraline  (ZOLOFT ) 25 MG tablet Take 25 mg by mouth in the morning.    [provider]  TYLENOL  325 MG tablet Take 325-650 mg by mouth every 8 (eight) hours as needed for  mild pain (pain score 1-3) (or headaches).    [provider]    Physical Exam: Vitals:   03/06/24 2215 03/06/24 2230 03/06/24 2245 03/06/24 2349  BP: (!) 90/53 (!) 89/58 (!) 91/55 94/69  Pulse: 65 61 60 (!) 57  Resp: (!) 34 (!) 30 (!) 28 (!) 24  Temp:      TempSrc:      SpO2: 97% 94% 94%   Weight:      Height:       General: Elderly male. Awake and alert and oriented x3. Not in any acute distress.  HEENT: NCAT.  PERRLA. EOMI.  Sclerae anicteric.  Moist mucosal membranes. Neck: Neck supple without lymphadenopathy. No carotid bruits. No masses palpated.  Cardiovascular: Regular rate with normal S1-S2 sounds. No murmurs, rubs or gallops auscultated. No JVD.  Respiratory: Bilateral Rales in lower lobes. No accessory muscle use. Abdomen: Soft, obese, nontender, nondistended. Active bowel sounds. No masses or hepatosplenomegaly  Skin: No rashes, lesions, or ulcerations.  Dry, warm to touch. Musculoskeletal: +2 LE edema B/L.  2+ dorsalis pedis and radial pulses. Good ROM.  No contractures  Psychiatric: Intact judgment and insight.  Mood appropriate to current condition. Neurologic: No focal neurological deficits. Strength is 5/5 x 4.  CN II - XII grossly intact.  Data Reviewed: EKG personally reviewed showed normal sinus rhythm at rate of 61 bpm with LAFB  Assessment and Plan: Acute on chronic diastolic heart failure Acute respiratory failure with hypoxia Chest x-ray personally reviewed showed bilateral blurring of costophrenic angle ( L > R ) and small pleural effusion. proBNP 16,139 Continue total input/output, daily weights and fluid restriction IV Lasix  40 mg x 1 was given in the ED Lasix  will be temporarily held at this time due to soft BP Continue heart healthy diet  Continue supplemental oxygen  to maintain O2 sats > 92% with plan to wean patient off this as tolerated (patient does not use oxygen  at baseline) Echocardiogram done on 02/02/2024 showed EF of 60 to 65%.  No WMA.  Moderate concentric LVH.  G3 DD.  Echocardiogram will be done in the morning   Presumed multifocal pneumonia Patient denies cough, fever or any ongoing respiratory symptoms Empiric IV Ceftriaxone and azithromycin was provided Procalcitonin will be checked prior to continuing IV antibiotics  Type 2 diabetes mellitus with hypoglycemia This was possibly due to glimepiride Continue D10 W and hypoglycemia protocol  Elevated troponin  possibly due to type II demand ischemia Troponin 178 > 196 (chronically elevated, this was 100 on 02/02/2024); patient denies any chest pain Continue to trend troponin  Macrocytic anemia MCV 108.8, vitamin B12 and folate levels will be checked  Acute kidney injury superimposed on CKD stage 5 creatinine 4.15 (baseline creatinine at 2.5-2.9) eGFR 14 Renally adjust medications, avoid nephrotoxic agents/dehydration/hypotension Nephrology will be consulted and we shall await further recommendations  Chronic thrombocytopenia Stable  Obesity class III (BMI 44.37) Diet and lifestyle modification Patient may need to follow-up with PCP for weight loss program    Advance Care Planning: Full code  Consults: Nephrology  Family Communication: None at bedside  Severity of Illness: The appropriate patient status for this patient is INPATIENT. Inpatient status is judged to be reasonable and necessary in order to provide the required intensity of service to ensure the patient's safety. The patient's presenting symptoms, physical exam findings, and initial radiographic and laboratory data in the context of their chronic comorbidities is felt to place them at high risk for further clinical deterioration.  Furthermore, it is not anticipated that the patient will be medically stable for discharge from the hospital within 2 midnights of admission.   * I certify that at the point of admission it is my clinical judgment that the patient will require inpatient hospital care spanning beyond 2 midnights from the point of admission due to high intensity of service, high risk for further deterioration and high frequency of surveillance required.*  Author: Tysen Roesler, DO 03/07/2024 12:01 AM  For on call review [email or web address].

## 2024-03-07 NOTE — Progress Notes (Signed)
*  PRELIMINARY RESULTS* Echocardiogram Limited 2-D has been performed with [facility].  Teresa Aida PARAS 03/07/2024, 3:28 PM

## 2024-03-07 NOTE — Consult Note (Addendum)
 Dave Brown Admit Date: 03/06/2024 03/07/2024 Bernardino KATHEE Gasman Requesting Physician:  Tat MD  Reason for Consult:  AKI on CKD4, AoC hypoxic respiratory failure HPI:  44M PMH including chronic HFpEF, CKD 4 followed by [facility] in [place], hypertension, OSA, chronic hypoxic respiratory failure on baseline 2 L nasal cannula presented with progressive dyspnea over the past 24 hours with orthopnea and increasing peripheral edema.  Found to have worsening hypoxia requiring additional oxygen  during EMS and ED assessment.  Additionally has had at least 6 pounds of weight gain since discharge based upon home scales.  In ED some concern for multifocal pneumonia based upon chest x-ray and placed on ceftriaxone and azithromycin along with Solu-Medrol .  Started on diuresis as well.  Baseline creatinine as above month ago appears to be between 2.6 and 2.8.  Presenting creatinine of 4.15 and here today is 4.58.  K is 4.1, bicarbonate 26.  Calcium  8.8 and phosphorus of 5.7.  Hemoglobin today 11.7, WBC 10.5.  Flu, RSV, COVID-negative.  Last echocardiogram 02/02/2024 with LVEF 60 to 65%, moderate LVH, grade 3 diastolic dysfunction present.  Patient seen in room with daughter [name] at bedside.  He feels fairly stable this morning but does endorse dyspnea more than his baseline.  No recent NSAID use.  No significant LUTS noted.  Does not have a Foley catheter.    Creatinine (mg/dL)  Date Value  87/79/7976 2.86 (H)  07/18/2013 2.0 (H)  12/21/2012 2.0 (H)  06/22/2012 2.2 (H)   Creatinine, Ser (mg/dL)  Date Value  88/86/7974 4.58 (H)  03/06/2024 4.15 (H)  02/05/2024 2.80 (H)  02/04/2024 2.60 (H)  02/03/2024 2.73 (H)  02/02/2024 2.52 (H)  02/01/2024 2.55 (H)  05/03/2020 2.77 (H)  05/02/2020 3.06 (H)  05/01/2020 3.49 (H)  ] I/Os: I/O last 3 completed shifts: In: 850 [IV Piggyback:850] Out: 75 [Urine:75]   ROS NSAIDS: Denies use IV Contrast no exposure TMP/SMX no identified exposure Hypotension  not present Balance of 12 systems is negative w/ exceptions as above  PMH  Past Medical History:  Diagnosis Date   CHF (congestive heart failure) (HCC)    Degenerative joint disease    High cholesterol    Hypertension    Kidney stones    Obesity    Respiratory failure with hypoxia (HCC)    Sleep apnea    PSH History reviewed. No pertinent surgical history. FH History reviewed. No pertinent family history. SH  reports that he quit smoking about 25 years ago. His smoking use included cigars. He does not have any smokeless tobacco history on file. He reports that he does not drink alcohol and does not use drugs. Allergies No Known Allergies Home medications Prior to Admission medications   Medication Sig Start Date End Date Taking? Authorizing Provider  albuterol  (PROVENTIL ) (2.5 MG/3ML) 0.083% nebulizer solution Take 2.5 mg by nebulization every 6 (six) hours as needed for shortness of breath or wheezing. 03/04/24   [provider]  budesonide-formoterol (SYMBICORT) 160-4.5 MCG/ACT inhaler Inhale 2 puffs into the lungs in the morning and at bedtime. 03/04/24 03/04/25  [provider]  furosemide  (LASIX ) 40 MG tablet Take 1 tablet (40 mg total) by mouth daily. PT. MUST MAKE AN APPOINTMENT IN ORDER TO RECEIVE FUTURE REFILLS. FIRST ATTEMPT. Patient taking differently: Take 40 mg by mouth in the morning. 09/20/23   [name], [name], MD  glimepiride (AMARYL) 1 MG tablet Take 1 mg by mouth daily with breakfast.    [provider]  Ipratropium-Albuterol  (COMBIVENT )  20-100 MCG/ACT AERS respimat Inhale 1 puff into the lungs 2 (two) times daily. 05/03/20   [name], [name], MD  lisinopril (ZESTRIL) 10 MG tablet Take 10 mg by mouth daily. 01/03/22   [provider]  metoprolol  succinate (TOPROL -XL) 50 MG 24 hr tablet Take 50 mg by mouth in the morning. 05/27/12   [provider]  rosuvastatin  (CRESTOR ) 10 MG tablet Take 10 mg by mouth daily.    [provider]  sertraline  (ZOLOFT ) 25 MG tablet Take 25 mg by mouth in the morning.    [provider]  TYLENOL  325 MG tablet Take 325-650 mg by mouth every 8 (eight) hours as needed for mild pain (pain score 1-3) (or headaches).    [provider]    Current Medications Scheduled Meds:  Chlorhexidine Gluconate Cloth  6 each Topical Daily   heparin injection (subcutaneous)  5,000 Units Subcutaneous Q8H   rosuvastatin   10 mg Oral Daily   sertraline   25 mg Oral Daily   Continuous Infusions:  dextrose  30 mL/hr at 03/07/24 0020   PRN Meds:.acetaminophen  **OR** acetaminophen , ondansetron  **OR** ondansetron  (ZOFRAN ) IV  CBC Recent Labs  Lab 03/06/24 2129 03/07/24 0442  WBC 11.0* 10.5  HGB 12.3* 11.7*  HCT 40.9 39.3  MCV 108.8* 109.5*  PLT 138* 121*   Basic Metabolic Panel Recent Labs  Lab 03/06/24 2129 03/07/24 0442  NA 141 141  K 4.1 4.1  CL 102 101  CO2 31 26  GLUCOSE 48* 70  BUN 46* 51*  CREATININE 4.15* 4.58*  CALCIUM  9.0 8.8*  PHOS  --  5.7*    Physical Exam  Blood pressure 110/62, pulse 61, temperature 97.7 F (36.5 C), temperature source Axillary, resp. rate (!) 23, height 5' 9 (1.753 m), weight (!) 136.3 kg, SpO2 95%. GEN: Elderly male, obese, chronically ill-appearing ENT: Nasal cannula in place EYES: Glasses on CV: Regular no murmur PULM: Diminished in the bases bilaterally, normal work of breathing ABD: Protuberant/obese, limited by adiposity.  Soft. SKIN: No rashes noted EXT: 2+ lower extreme edema and symmetrically noted.  Assessment 20M presenting with acute on chronic hypoxic respiratory failure, acute on chronic HFpEF, acute on chronic CKD 4.  AoC CKD4 Follows with [facility] in [place] Most likely this is cardiorenal syndrome with an acute exacerbation of HFpEF Need to exclude obstruction given age, renal ultrasound ordered Hold ACEi Once obstruction excluded would resume diuretics, recommend Lasix  120 mg every 12 hours to  be given Acute on chronic HFpEF with acute on chronic hypoxic respiratory failure and volume overload: Diuretic recommendations as above.  Decisions about antibiotics per primary hospitalist but this seems more related to volume status than pneumonia. Anemia: Mild, no ESA indicated.  Plan As above High risk of progression to dialysis dependence, discussed with patient and family.  Will follow-up after renal imaging and seeing response to potential diuretics.   Bernardino KATHEE Gasman  03/07/2024, 9:11 AM

## 2024-03-07 NOTE — Plan of Care (Signed)

## 2024-03-07 NOTE — Progress Notes (Signed)
 Spoke with Erminio Cone, NP on call about pt's ongoing oliguria, results of abd US , and clinical presentation.  Nephro/CCM rec diuresis, but pt currently on low dose levophed.  Holding off for now unless levo can be weaned off or clinical presentation changes. Will update accordingly.

## 2024-03-07 NOTE — Op Note (Signed)
 Patient:  Dave Brown  DOB:  [birth year]  MRN:  [id number]   Preop Diagnosis: Acute on chronic heart failure, need for pressor support  Postop Diagnosis: Same  Procedure: Central line placement  Surgeon: Oneil Budge, MD  Anes: Local  Indications: Patient is a 78 year old white male with multiple comorbidities including acute on chronic diastolic heart failure and hypoxia who was started on Levophed and needs central venous access.  The risks and benefits of the procedure including bleeding and infection were fully explained to the patient, who gave informed consent.  Procedure note: The procedure was performed at bedside in the ICU.  A timeout was performed.  1% Xylocaine was used for local anesthesia.  Due to body habitus, a right femoral approach was done.  A needle was advanced into the right femoral vein without difficulty.  A guidewire was then advanced.  An introducer was placed over the guidewire.  A triple-lumen 20 cm central line was then placed over the guidewire.  Good backflow of venous blood was noted on aspiration of all 3 ports.  All 3 ports were flushed with saline.  There was secured in the place using 3-0 silk sutures.  A dry sterile dressing was applied.  The patient tolerated the procedure well.  Complications: None  EBL: Minimal  Specimen: None

## 2024-03-08 ENCOUNTER — Inpatient Hospital Stay
Admission: AD | Admit: 2024-03-08 | Discharge: 2024-04-05 | DRG: 291 | Disposition: A | Source: Intra-hospital | Attending: Internal Medicine | Admitting: Internal Medicine

## 2024-03-08 ENCOUNTER — Encounter (HOSPITAL_COMMUNITY): Payer: Self-pay

## 2024-03-08 DIAGNOSIS — J9621 Acute and chronic respiratory failure with hypoxia: Secondary | ICD-10-CM | POA: Diagnosis not present

## 2024-03-08 DIAGNOSIS — N178 Other acute kidney failure: Secondary | ICD-10-CM | POA: Diagnosis not present

## 2024-03-08 DIAGNOSIS — I5A Non-ischemic myocardial injury (non-traumatic): Secondary | ICD-10-CM | POA: Insufficient documentation

## 2024-03-08 DIAGNOSIS — I5023 Acute on chronic systolic (congestive) heart failure: Secondary | ICD-10-CM | POA: Diagnosis not present

## 2024-03-08 DIAGNOSIS — J449 Chronic obstructive pulmonary disease, unspecified: Secondary | ICD-10-CM | POA: Insufficient documentation

## 2024-03-08 DIAGNOSIS — I5033 Acute on chronic diastolic (congestive) heart failure: Secondary | ICD-10-CM | POA: Diagnosis not present

## 2024-03-08 DIAGNOSIS — E162 Hypoglycemia, unspecified: Secondary | ICD-10-CM | POA: Diagnosis not present

## 2024-03-08 DIAGNOSIS — Z79899 Other long term (current) drug therapy: Secondary | ICD-10-CM | POA: Diagnosis not present

## 2024-03-08 DIAGNOSIS — H9190 Unspecified hearing loss, unspecified ear: Secondary | ICD-10-CM

## 2024-03-08 DIAGNOSIS — J810 Acute pulmonary edema: Secondary | ICD-10-CM

## 2024-03-08 DIAGNOSIS — N179 Acute kidney failure, unspecified: Secondary | ICD-10-CM | POA: Diagnosis not present

## 2024-03-08 DIAGNOSIS — I440 Atrioventricular block, first degree: Secondary | ICD-10-CM | POA: Diagnosis not present

## 2024-03-08 DIAGNOSIS — R579 Shock, unspecified: Secondary | ICD-10-CM

## 2024-03-08 DIAGNOSIS — N189 Chronic kidney disease, unspecified: Secondary | ICD-10-CM | POA: Diagnosis present

## 2024-03-08 DIAGNOSIS — G928 Other toxic encephalopathy: Secondary | ICD-10-CM | POA: Diagnosis not present

## 2024-03-08 DIAGNOSIS — J15212 Pneumonia due to Methicillin resistant Staphylococcus aureus: Secondary | ICD-10-CM

## 2024-03-08 DIAGNOSIS — I4581 Long QT syndrome: Secondary | ICD-10-CM | POA: Diagnosis not present

## 2024-03-08 DIAGNOSIS — N186 End stage renal disease: Secondary | ICD-10-CM

## 2024-03-08 DIAGNOSIS — Z4901 Encounter for fitting and adjustment of extracorporeal dialysis catheter: Secondary | ICD-10-CM

## 2024-03-08 DIAGNOSIS — R531 Weakness: Secondary | ICD-10-CM

## 2024-03-08 DIAGNOSIS — D631 Anemia in chronic kidney disease: Secondary | ICD-10-CM

## 2024-03-08 DIAGNOSIS — E66813 Obesity, class 3: Secondary | ICD-10-CM | POA: Diagnosis not present

## 2024-03-08 DIAGNOSIS — N184 Chronic kidney disease, stage 4 (severe): Secondary | ICD-10-CM | POA: Diagnosis present

## 2024-03-08 DIAGNOSIS — J9622 Acute and chronic respiratory failure with hypercapnia: Principal | ICD-10-CM | POA: Diagnosis present

## 2024-03-08 DIAGNOSIS — I480 Paroxysmal atrial fibrillation: Secondary | ICD-10-CM

## 2024-03-08 DIAGNOSIS — I5031 Acute diastolic (congestive) heart failure: Principal | ICD-10-CM | POA: Diagnosis present

## 2024-03-08 DIAGNOSIS — R7989 Other specified abnormal findings of blood chemistry: Secondary | ICD-10-CM

## 2024-03-08 DIAGNOSIS — E119 Type 2 diabetes mellitus without complications: Secondary | ICD-10-CM

## 2024-03-08 LAB — COMPREHENSIVE METABOLIC PANEL WITH GFR
ALT: 20 U/L (ref 0–44)
AST: 23 U/L (ref 15–41)
Albumin: 3.9 g/dL (ref 3.5–5.0)
Alkaline Phosphatase: 90 U/L (ref 38–126)
Anion gap: 12 (ref 5–15)
BUN: 74 mg/dL — ABNORMAL HIGH (ref 8–23)
CO2: 27 mmol/L (ref 22–32)
Calcium: 9.1 mg/dL (ref 8.9–10.3)
Chloride: 94 mmol/L — ABNORMAL LOW (ref 98–111)
Creatinine, Ser: 6.62 mg/dL — ABNORMAL HIGH (ref 0.61–1.24)
GFR, Estimated: 8 mL/min — ABNORMAL LOW (ref >60)
Glucose, Bld: 142 mg/dL — ABNORMAL HIGH (ref 70–99)
Potassium: 5.4 mmol/L — ABNORMAL HIGH (ref 3.5–5.1)
Sodium: 133 mmol/L — ABNORMAL LOW (ref 135–145)
Total Bilirubin: 0.4 mg/dL (ref 0.0–1.2)
Total Protein: 7 g/dL (ref 6.5–8.1)

## 2024-03-08 LAB — APTT: aPTT: 29 s (ref 24–36)

## 2024-03-08 LAB — CBC
HCT: 38.6 % — ABNORMAL LOW (ref 39.0–52.0)
Hemoglobin: 11.7 g/dL — ABNORMAL LOW (ref 13.0–17.0)
MCH: 32.5 pg (ref 26.0–34.0)
MCHC: 30.3 g/dL (ref 30.0–36.0)
MCV: 107.2 fL — ABNORMAL HIGH (ref 80.0–100.0)
Platelets: 139 K/uL — ABNORMAL LOW (ref 150–400)
RBC: 3.6 MIL/uL — ABNORMAL LOW (ref 4.22–5.81)
RDW: 14 % (ref 11.5–15.5)
WBC: 12.7 K/uL — ABNORMAL HIGH (ref 4.0–10.5)
nRBC: 0 % (ref 0.0–0.2)

## 2024-03-08 LAB — CBC WITH DIFFERENTIAL/PLATELET
Abs Immature Granulocytes: 0.13 K/uL — ABNORMAL HIGH (ref 0.00–0.07)
Basophils Absolute: 0 K/uL (ref 0.0–0.1)
Basophils Relative: 0 %
Eosinophils Absolute: 0 K/uL (ref 0.0–0.5)
Eosinophils Relative: 0 %
HCT: 39.8 % (ref 39.0–52.0)
Hemoglobin: 12 g/dL — ABNORMAL LOW (ref 13.0–17.0)
Immature Granulocytes: 1 %
Lymphocytes Relative: 5 %
Lymphs Abs: 0.8 K/uL (ref 0.7–4.0)
MCH: 32.4 pg (ref 26.0–34.0)
MCHC: 30.2 g/dL (ref 30.0–36.0)
MCV: 107.6 fL — ABNORMAL HIGH (ref 80.0–100.0)
Monocytes Absolute: 0.8 K/uL (ref 0.1–1.0)
Monocytes Relative: 4 %
Neutro Abs: 15.3 K/uL — ABNORMAL HIGH (ref 1.7–7.7)
Neutrophils Relative %: 90 %
Platelets: 179 K/uL (ref 150–400)
RBC: 3.7 MIL/uL — ABNORMAL LOW (ref 4.22–5.81)
RDW: 14.1 % (ref 11.5–15.5)
WBC: 17.1 K/uL — ABNORMAL HIGH (ref 4.0–10.5)
nRBC: 0 % (ref 0.0–0.2)

## 2024-03-08 LAB — RENAL FUNCTION PANEL
Albumin: 3.8 g/dL (ref 3.5–5.0)
Anion gap: 11 (ref 5–15)
BUN: 64 mg/dL — ABNORMAL HIGH (ref 8–23)
CO2: 29 mmol/L (ref 22–32)
Calcium: 8.9 mg/dL (ref 8.9–10.3)
Chloride: 97 mmol/L — ABNORMAL LOW (ref 98–111)
Creatinine, Ser: 5.86 mg/dL — ABNORMAL HIGH (ref 0.61–1.24)
GFR, Estimated: 9 mL/min — ABNORMAL LOW (ref >60)
Glucose, Bld: 141 mg/dL — ABNORMAL HIGH (ref 70–99)
Phosphorus: 7.3 mg/dL — ABNORMAL HIGH (ref 2.5–4.6)
Potassium: 4.8 mmol/L (ref 3.5–5.1)
Sodium: 136 mmol/L (ref 135–145)

## 2024-03-08 LAB — LACTIC ACID, PLASMA: Lactic Acid, Venous: 0.8 mmol/L (ref 0.5–1.9)

## 2024-03-08 LAB — GLUCOSE, CAPILLARY
Glucose-Capillary: 116 mg/dL — ABNORMAL HIGH (ref 70–99)
Glucose-Capillary: 131 mg/dL — ABNORMAL HIGH (ref 70–99)
Glucose-Capillary: 143 mg/dL — ABNORMAL HIGH (ref 70–99)
Glucose-Capillary: 152 mg/dL — ABNORMAL HIGH (ref 70–99)
Glucose-Capillary: 202 mg/dL — ABNORMAL HIGH (ref 70–99)

## 2024-03-08 LAB — PHOSPHORUS: Phosphorus: 8.6 mg/dL — ABNORMAL HIGH (ref 2.5–4.6)

## 2024-03-08 LAB — BLOOD GAS, ARTERIAL
Acid-base deficit: 3.2 mmol/L — ABNORMAL HIGH (ref 0.0–2.0)
Bicarbonate: 27.9 mmol/L (ref 20.0–28.0)
O2 Saturation: 98.8 %
Patient temperature: 37
pCO2 arterial: 82 mmHg (ref 32–48)
pH, Arterial: 7.14 — CL (ref 7.35–7.45)
pO2, Arterial: 91 mmHg (ref 83–108)

## 2024-03-08 LAB — MAGNESIUM: Magnesium: 2.7 mg/dL — ABNORMAL HIGH (ref 1.7–2.4)

## 2024-03-08 LAB — HEMOGLOBIN A1C
Hgb A1c MFr Bld: 5.2 % (ref 4.8–5.6)
Mean Plasma Glucose: 102.54 mg/dL

## 2024-03-08 LAB — T4, FREE: Free T4: 0.71 ng/dL (ref 0.61–1.12)

## 2024-03-08 LAB — MRSA NEXT GEN BY PCR, NASAL: MRSA by PCR Next Gen: NOT DETECTED

## 2024-03-08 LAB — CORTISOL-AM, BLOOD: Cortisol - AM: 64 ug/dL — ABNORMAL HIGH (ref 6.7–22.6)

## 2024-03-08 LAB — FOLATE: Folate: 11.1 ng/mL (ref >5.9)

## 2024-03-08 LAB — PROTIME-INR
INR: 1 (ref 0.8–1.2)
Prothrombin Time: 14.3 s (ref 11.4–15.2)

## 2024-03-08 LAB — TSH: TSH: 0.668 u[IU]/mL (ref 0.350–4.500)

## 2024-03-08 MED ORDER — CHLORHEXIDINE GLUCONATE CLOTH 2 % EX PADS
6.0000 | MEDICATED_PAD | Freq: Every day | CUTANEOUS | Status: DC
  Administered 2024-03-08 – 2024-03-16 (×9): 6 via TOPICAL

## 2024-03-08 MED ORDER — FUROSEMIDE 10 MG/ML IJ SOLN
160.0000 mg | Freq: Once | INTRAVENOUS | Status: AC
  Administered 2024-03-08: 160 mg via INTRAVENOUS
  Filled 2024-03-08: qty 16

## 2024-03-08 MED ORDER — POLYETHYLENE GLYCOL 3350 17 G PO PACK: 17.0000 g | PACK | Freq: Every day | ORAL | Status: DC | PRN

## 2024-03-08 MED ORDER — DEXMEDETOMIDINE HCL IN NACL 400 MCG/100ML IV SOLN
INTRAVENOUS | Status: AC
Start: 2024-03-08 — End: 2024-03-08
  Administered 2024-03-08: 0.4 ug/kg/h via INTRAVENOUS
  Filled 2024-03-08: qty 100

## 2024-03-08 MED ORDER — MIDODRINE HCL 10 MG PO TABS: 10.0000 mg | ORAL_TABLET | Freq: Three times a day (TID) | ORAL | Status: DC

## 2024-03-08 MED ORDER — NOREPINEPHRINE 4 MG/250ML-% IV SOLN
0.0000 ug/min | INTRAVENOUS | Status: DC
  Administered 2024-03-09: 15 ug/min via INTRAVENOUS
  Administered 2024-03-09: 10 ug/min via INTRAVENOUS
  Administered 2024-03-09: 3 ug/min via INTRAVENOUS
  Administered 2024-03-09: 14 ug/min via INTRAVENOUS
  Administered 2024-03-09: 10 ug/min via INTRAVENOUS
  Administered 2024-03-10 (×2): 8 ug/min via INTRAVENOUS
  Filled 2024-03-08 (×6): qty 250

## 2024-03-08 MED ORDER — IPRATROPIUM-ALBUTEROL 0.5-2.5 (3) MG/3ML IN SOLN
3.0000 mL | Freq: Four times a day (QID) | RESPIRATORY_TRACT | Status: DC
  Administered 2024-03-08 – 2024-03-11 (×13): 3 mL via RESPIRATORY_TRACT
  Filled 2024-03-08 (×13): qty 3

## 2024-03-08 MED ORDER — NOREPINEPHRINE 4 MG/250ML-% IV SOLN: 0.0000 ug/min | INTRAVENOUS | Status: DC

## 2024-03-08 MED ORDER — HEPARIN SODIUM (PORCINE) 5000 UNIT/ML IJ SOLN
5000.0000 [IU] | Freq: Three times a day (TID) | INTRAMUSCULAR | Status: DC
  Administered 2024-03-08 – 2024-03-09 (×2): 5000 [IU] via SUBCUTANEOUS
  Filled 2024-03-08 (×2): qty 1

## 2024-03-08 MED ORDER — NOREPINEPHRINE 4 MG/250ML-% IV SOLN
0.0000 ug/min | INTRAVENOUS | Status: DC
  Administered 2024-03-08: 5 ug/min via INTRAVENOUS

## 2024-03-08 MED ORDER — PRISMASOL BGK 4/2.5 32-4-2.5 MEQ/L EC SOLN: Status: DC

## 2024-03-08 MED ORDER — DEXMEDETOMIDINE HCL IN NACL 400 MCG/100ML IV SOLN
0.0000 ug/kg/h | INTRAVENOUS | Status: DC
  Administered 2024-03-08: 0.4 ug/kg/h via INTRAVENOUS
  Administered 2024-03-09: 0.5 ug/kg/h via INTRAVENOUS
  Administered 2024-03-09 (×2): 1 ug/kg/h via INTRAVENOUS
  Filled 2024-03-08 (×3): qty 100

## 2024-03-08 MED ORDER — HEPARIN SODIUM (PORCINE) 1000 UNIT/ML DIALYSIS
1000.0000 [IU] | INTRAMUSCULAR | Status: DC | PRN
  Filled 2024-03-08: qty 5

## 2024-03-08 MED ORDER — DOCUSATE SODIUM 100 MG PO CAPS
100.0000 mg | ORAL_CAPSULE | Freq: Two times a day (BID) | ORAL | Status: DC | PRN
  Administered 2024-03-28: 100 mg via ORAL
  Filled 2024-03-08: qty 1

## 2024-03-08 MED ORDER — IPRATROPIUM-ALBUTEROL 0.5-2.5 (3) MG/3ML IN SOLN
3.0000 mL | Freq: Four times a day (QID) | RESPIRATORY_TRACT | Status: DC | PRN
  Administered 2024-03-19 (×2): 3 mL via RESPIRATORY_TRACT
  Filled 2024-03-08 (×2): qty 3

## 2024-03-08 MED ORDER — INSULIN ASPART 100 UNIT/ML IJ SOLN
0.0000 [IU] | INTRAMUSCULAR | Status: DC
  Administered 2024-03-10 – 2024-03-13 (×2): 1 [IU] via SUBCUTANEOUS
  Administered 2024-03-14: 2 [IU] via SUBCUTANEOUS
  Administered 2024-03-14 – 2024-03-15 (×2): 1 [IU] via SUBCUTANEOUS
  Administered 2024-03-15: 2 [IU] via SUBCUTANEOUS
  Administered 2024-03-18: 1 [IU] via SUBCUTANEOUS
  Filled 2024-03-08 (×2): qty 2
  Filled 2024-03-08 (×5): qty 1

## 2024-03-08 MED ORDER — HYDROCORTISONE SOD SUC (PF) 100 MG IJ SOLR: 100.0000 mg | Freq: Three times a day (TID) | INTRAMUSCULAR | Status: DC

## 2024-03-08 NOTE — Progress Notes (Signed)
 eLink Physician-Brief Progress Note Patient Name: Dave Brown DOB: 21-Sep-1945 MRN: [id number]   Date of Service  03/08/2024  HPI/Events of Note  Patient admitted to [facility] ICU with acute respiratory failure secondary to decompensated heart failure and acute kidney injury, she is to receive CRRT.   eICU Interventions  New Patient Evaluation.        Kateline Kinkade U Nichole Neyer 03/08/2024, 7:48 PM

## 2024-03-08 NOTE — Progress Notes (Addendum)
 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  Patient denies any fevers, chills, chest pain.  He complains of orthopnea and mild increase of his lower extremity edema. The patient was recently admitted to the hospital from 02/01/2024 to 02/05/2024 for respiratory failure due to CHF.  The patient did not require oxygen  at the time of his discharge.  His discharge weight was 286.  At his last office visit with his primary provider his weight was 291 pounds on 02/22/24.  Follow up with pulmonary on 03/04/24 office weight 29.   He was discharged home with furosemide  40 mg daily during last hospitalization.  Since the patient is discharged he has followed up with pulmonary medicine at [facility] on 03/04/2024.   He has a history of smoking several packs of cigars daily during his youth, transitioning from cigarettes to cigars, and has abstained from smoking for the past 15 years.  It was noted the patient had a CPAP machine he has not used in 4 years. The patient was set up with home oxygen  2 L during that appointment.  He was noted to have worsening lower extremity edema during that appointment.  He was prescribed Symbicort.  His weight during that appointment was 297 pounds.  In the ED, the patient was afebrile and hemodynamically stable.  Oxygen  saturation was 93-97% on 4 L.  WBC 11.0, hemoglobin 12.3, platelets 138.  Sodium 141, potassium 4.1, bicarbonate 31, serum creatinine 4.15.  proBNP 16,139.  PCT 0.13.  Lactic acid 1.9 >> 0.8.  COVID-negative chest x-ray showed patchy opacities right lower lobe and left lower lobe.  The patient was started furosemide  40 mg IV, Solu-Medrol  125 mg, ceftriaxone, and azithromycin.  He was given albuterol   and Atrovent.   Assessment/Plan:  Acute on chronic respiratory failure with hypoxia - Chronically on 2 L at home - Currently on 4 L - Secondary to fluid overload - Wean oxygen  as tolerated back to baseline - CT chest--sm bilateral pleural effusions with bibasilar atelectasis   Acute on chronic HFpEF - 02/02/2024 echo EF 60 to 65%, normal RVF, grade 3 DD - Remains fluid overloaded - Continue IV furosemide  per nephrology - 03/07/24 Echo EF 60-65%, no WMA, no pericardial effusion   Acute on chronic renal failure--CKD stage IV - Baseline creatinine 2.5-2.8 - Nephrology consult appreciated - abd US --no hydronephrosis - discussed with Dr. Nancyann likely need to initiate CRRT - transfer to [facility] - serum creatinine up to 5.86 - discussed with CCM, Dr. Lyndia hypotension on levophed and worsen renal function>>ok to transfer to [facility]   Pulmonary opacities - initially on  ceftriaxone and azithromycin - PCT 0.13 - CT chest--sm bilateral pleural effusions with bibasilar atelectasis -COVID/RSV/Flu neg -viral respiratory panel--neg   Elevated troponin - Secondary to demand ischemia - No chest pain presently - Troponin trend is flat   Diabetes mellitus type 2 with hyperglycemia - Patient was on Amaryl in the outpatient setting - Do not plan to restart Amaryl at this point - Checking CBGs - NovoLog  sliding scale - on D10W temporarily>>d/c - started on solucortef due to concern for AI   Morbid obesity - BMI 44.37 - Lifestyle modification   Essential hypertension - Holding metoprolol  succinate, lisinopril  at this point secondary to soft BPs - now hypotensive one levophed   Thrombocytopenia - Appears to be chronic - B12--2517 - Folic acid--11.1 - TSH--0.608 -    COPD -FEV1  03/04/2024 0.98 L   FEV1/FVC  03/04/2024 72 %  - Continue Symbicort or therapeutic equivalent                 Family Communication:   DTR at bedside 11/14   Consultants:  renal    Code Status:  FULL   DVT Prophylaxis:  [place] Heparin      Procedures: As Listed in Progress Note Above   Antibiotics: Ceftriaxone/azithro 11/13   The patient is critically ill with multiple organ systems failure and requires high complexity decision making for assessment and support, frequent evaluation and titration of therapies, application of advanced monitoring technologies and extensive interpretation of multiple databases.  Critical care time - 40 mins.    Subjective: Pt mildly somnolent, arouses to voice.  Denies cp, sob, abd pain, n/v  Objective: Vitals:   03/08/24 0751 03/08/24 0800 03/08/24 0815 03/08/24 1039  BP:  (!) 85/58 (!) 93/49   Pulse:  64 (!) 59   Resp:  (!) 21 (!) 23   Temp: (!) 97.4 F (36.3 C)     TempSrc: Axillary     SpO2:  96% 94% 97%  Weight:      Height:        Intake/Output Summary (Last 24 hours) at 03/08/2024 1117 Last data filed at 03/08/2024 0800 Gross per 24 hour  Intake 722.78 ml  Output 50 ml  Net 672.78 ml   Weight change: 6.8 kg Exam:  General:  Pt is alert, follows commands appropriately, not in acute distress HEENT: No icterus, No thrush, No neck mass, [place]/AT Cardiovascular: RRR, S1/S2, no rubs, no gallops Respiratory: bilateral crackles Abdomen: Soft/+BS, non tender, non distended, no guarding Extremities: 1 + LE edema, No lymphangitis, No petechiae, No rashes, no synovitis   Data Reviewed: I have personally reviewed following labs and imaging studies Basic Metabolic Panel: Recent Labs  Lab 03/06/24 2129 03/07/24 0442 03/08/24 0311  NA 141 141 136  K 4.1 4.1 4.8  CL 102 101 97*  CO2 31 26 29   GLUCOSE 48* 70 141*  BUN 46* 51* 64*  CREATININE 4.15* 4.58* 5.86*  CALCIUM  9.0 8.8* 8.9  MG  --  2.6*  --   PHOS  --  5.7* 7.3*   Liver Function Tests: Recent Labs  Lab 03/06/24 2129 03/07/24 0442 03/08/24 0311  AST 20 23  --   ALT 18 18  --   ALKPHOS 91 88  --   BILITOT 0.3 0.3  --   PROT 6.7 6.7  --    ALBUMIN 3.7 3.7 3.8   No results for input(s): LIPASE, AMYLASE in the last 168 hours. No results for input(s): AMMONIA in the last 168 hours. Coagulation Profile: No results for input(s): INR, PROTIME in the last 168 hours. CBC: Recent Labs  Lab 03/06/24 2129 03/07/24 0442 03/08/24 0311  WBC 11.0* 10.5 12.7*  HGB 12.3* 11.7* 11.7*  HCT 40.9 39.3 38.6*  MCV 108.8* 109.5* 107.2*  PLT 138* 121* 139*   Cardiac Enzymes: No results for input(s): CKTOTAL, CKMB, CKMBINDEX, TROPONINI in the last 168 hours. BNP: Invalid input(s): POCBNP CBG: Recent Labs  Lab 03/07/24 1633 03/07/24 2001 03/08/24 0007 03/08/24 0340 03/08/24 0750  GLUCAP 118* 138* 143* 152* 202*   HbA1C: Recent Labs    03/08/24 0311  HGBA1C 5.2   Urine analysis: No results found for: COLORURINE, APPEARANCEUR, LABSPEC, PHURINE, GLUCOSEU, HGBUR, BILIRUBINUR, KETONESUR, PROTEINUR, UROBILINOGEN, NITRITE, LEUKOCYTESUR Sepsis Labs: @LABRCNTIP (procalcitonin:4,lacticidven:4) ) Recent Results (from the past 240 hours)  Blood culture (routine x 2)     Status: None (Preliminary result)   Collection Time: 03/06/24  9:29 PM   Specimen: BLOOD  Result Value Ref Range Status   Specimen Description BLOOD RIGHT ANTECUBITAL  Final   Special Requests   Final    BOTTLES DRAWN AEROBIC AND ANAEROBIC Blood Culture adequate volume   Culture   Final    NO GROWTH 2 DAYS Performed at [facility], [address], [place], [place] [address]    Report Status PENDING  Incomplete  Resp panel by RT-PCR (RSV, Flu A&B, Covid) Anterior Nasal Swab     Status: None   Collection Time: 03/06/24  9:33 PM   Specimen: Anterior Nasal Swab  Result Value Ref Range Status   SARS Coronavirus 2 by RT PCR NEGATIVE NEGATIVE Final    Comment: (NOTE) SARS-CoV-2 target nucleic acids are NOT DETECTED.  The SARS-CoV-2 RNA is generally detectable in upper respiratory specimens during the acute phase of  infection. The lowest concentration of SARS-CoV-2 viral copies this assay can detect is 138 copies/mL. A negative result does not preclude SARS-Cov-2 infection and should not be used as the sole basis for treatment or other patient management decisions. A negative result may occur with  improper specimen collection/handling, submission of specimen other than nasopharyngeal swab, presence of viral mutation(s) within the areas targeted by this assay, and inadequate number of viral copies(<138 copies/mL). A negative result must be combined with clinical observations, patient history, and epidemiological information. The expected result is Negative.  Fact Sheet for Patients:  bloggercourse.com  Fact Sheet for Healthcare Providers:  seriousbroker.it  This test is no t yet approved or cleared by the [place]  FDA and  has been authorized for detection and/or diagnosis of SARS-CoV-2 by FDA under an Emergency Use Authorization (EUA). This EUA will remain  in effect (meaning this test can be used) for the duration of the COVID-19 declaration under Section 564(b)(1) of the Act, 21 U.S.C.section 360bbb-3(b)(1), unless the authorization is terminated  or revoked sooner.       Influenza A by PCR NEGATIVE NEGATIVE Final   Influenza B by PCR NEGATIVE NEGATIVE Final    Comment: (NOTE) The Xpert Xpress SARS-CoV-2/FLU/RSV plus assay is intended as an aid in the diagnosis of influenza from Nasopharyngeal swab specimens and should not be used as a sole basis for treatment. Nasal washings and aspirates are unacceptable for Xpert Xpress SARS-CoV-2/FLU/RSV testing.  Fact Sheet for Patients: bloggercourse.com  Fact Sheet for Healthcare Providers: seriousbroker.it  This test is not yet approved or cleared by the [place]  FDA and has been authorized for detection and/or diagnosis of SARS-CoV-2  by FDA under an Emergency Use Authorization (EUA). This EUA will remain in effect (meaning this test can be used) for the duration of the COVID-19 declaration under Section 564(b)(1) of the Act, 21 U.S.C. section 360bbb-3(b)(1), unless the authorization is terminated or revoked.     Resp Syncytial Virus by PCR NEGATIVE NEGATIVE Final    Comment: (NOTE) Fact Sheet for Patients: bloggercourse.com  Fact Sheet for Healthcare Providers: seriousbroker.it  This test is not yet approved or cleared by the [place]  FDA and has been authorized for detection and/or diagnosis of SARS-CoV-2 by FDA under an Emergency Use Authorization (EUA). This EUA will remain in  effect (meaning this test can be used) for the duration of the COVID-19 declaration under Section 564(b)(1) of the Act, 21 U.S.C. section 360bbb-3(b)(1), unless the authorization is terminated or revoked.  Performed at [facility], [address] [place], [place] [address]   Blood culture (routine x 2)     Status: None (Preliminary result)   Collection Time: 03/06/24 10:55 PM   Specimen: BLOOD  Result Value Ref Range Status   Specimen Description BLOOD BLOOD RIGHT HAND  Final   Special Requests   Final    BOTTLES DRAWN AEROBIC AND ANAEROBIC Blood Culture adequate volume   Culture   Final    NO GROWTH 2 DAYS Performed at [facility], [address] [place], [place] [address]    Report Status PENDING  Incomplete  MRSA Next Gen by PCR, Nasal     Status: None   Collection Time: 03/07/24  1:43 AM   Specimen: Nasal Mucosa; Nasal Swab  Result Value Ref Range Status   MRSA by PCR Next Gen NOT DETECTED NOT DETECTED Final    Comment: (NOTE) The GeneXpert MRSA Assay (FDA approved for NASAL specimens only), is one component of a comprehensive MRSA colonization surveillance program. It is not intended to diagnose MRSA infection nor to guide or monitor treatment for MRSA  infections. Test performance is not FDA approved in patients less than 49 years old. Performed at [facility], [address], [place], [place] [address]   Respiratory (~20 pathogens) panel by PCR     Status: None   Collection Time: 03/07/24 11:18 AM   Specimen: Nasopharyngeal Swab; Respiratory  Result Value Ref Range Status   Adenovirus NOT DETECTED NOT DETECTED Final   Coronavirus 229E NOT DETECTED NOT DETECTED Final    Comment: (NOTE) The Coronavirus on the Respiratory Panel, DOES NOT test for the novel  Coronavirus (2019 nCoV)    Coronavirus HKU1 NOT DETECTED NOT DETECTED Final   Coronavirus NL63 NOT DETECTED NOT DETECTED Final   Coronavirus OC43 NOT DETECTED NOT DETECTED Final   Metapneumovirus NOT DETECTED NOT DETECTED Final   Rhinovirus / Enterovirus NOT DETECTED NOT DETECTED Final   Influenza A NOT DETECTED NOT DETECTED Final   Influenza B NOT DETECTED NOT DETECTED Final   Parainfluenza Virus 1 NOT DETECTED NOT DETECTED Final   Parainfluenza Virus 2 NOT DETECTED NOT DETECTED Final   Parainfluenza Virus 3 NOT DETECTED NOT DETECTED Final   Parainfluenza Virus 4 NOT DETECTED NOT DETECTED Final   Respiratory Syncytial Virus NOT DETECTED NOT DETECTED Final   Bordetella pertussis NOT DETECTED NOT DETECTED Final   Bordetella Parapertussis NOT DETECTED NOT DETECTED Final   Chlamydophila pneumoniae NOT DETECTED NOT DETECTED Final   Mycoplasma pneumoniae NOT DETECTED NOT DETECTED Final    Comment: Performed at [facility], [address] [place], [place] [address]     Scheduled Meds:  Chlorhexidine Gluconate Cloth  6 each Topical Daily   heparin injection (subcutaneous)  5,000 Units Subcutaneous Q8H   hydrocortisone sod succinate (SOLU-CORTEF) inj  100 mg Intravenous Q8H   midodrine  10 mg Oral TID WC   rosuvastatin   10 mg Oral Daily   sertraline   25 mg Oral Daily   sodium chloride  flush  10-40 mL Intracatheter Q12H   Continuous Infusions:  sodium chloride       dextrose  30 mL/hr at 03/07/24 0020   furosemide      norepinephrine (LEVOPHED) Adult infusion 2 mcg/min (03/08/24 1016)    Procedures/Studies: US  Abdomen Complete Result  Date: 03/07/2024 EXAM: COMPLETE ABDOMINAL ULTRASOUND TECHNIQUE: Real-time ultrasonography of the abdomen was performed. COMPARISON: None available. CLINICAL HISTORY: Acute kidney injury, thrombocytopenia. FINDINGS: LIVER: Hepatic steatosis. No intrahepatic biliary ductal dilatation. Patent portal vein with antegrade flow. BILIARY SYSTEM: The gallbladder was unable to be visualized due to body habitus and difficulty with positioning. The common bile duct measures 4 mm. No cholelithiasis. KIDNEYS: Right kidney measures 8.2 cm in length. Left kidney measures 9.9 cm in length. There is increased cortical echogenicity in both kidneys. A 2.8 cm cyst is present in the right kidney, which does not require follow up. Limited visualization of the left kidney. PANCREAS: The pancreas was not visualized. SPLEEN: Splenomegaly measuring 16.7 cm greatest dimension. Volume of 923 ml. VESSELS: Poor visualization of the aorta and IVC. OTHER: The exam is significantly limited by body habitus and inability to roll / position properly. IMPRESSION: 1. Limited exam. Gallbladder not visualized. 2. Increased cortical echogenicity in both kidneys, compatible with medical renal disease. 3. Splenomegaly measuring 16.7 cm (volume 923 mL). 4. Hepatic steatosis. Electronically signed by: Norman Gatlin MD 03/07/2024 07:25 PM EST RP Workstation: [id number]   CT CHEST WO CONTRAST Result Date: 03/07/2024 CLINICAL DATA:  Pneumonia complication suspected. EXAM: CT CHEST WITHOUT CONTRAST TECHNIQUE: Multidetector CT imaging of the chest was performed following the standard protocol without IV contrast. RADIATION DOSE REDUCTION: This exam was performed according to the departmental dose-optimization program which includes automated exposure control, adjustment of the mA and/or  kV according to patient size and/or use of iterative reconstruction technique. COMPARISON:  Chest radiograph dated 03/06/2024. FINDINGS: Evaluation of this exam is limited in the absence of intravenous contrast. Cardiovascular: Borderline cardiomegaly. No pericardial effusion. Advanced 3 vessel coronary vascular calcification. Mild atherosclerotic calcification of the thoracic aorta. No aneurysmal dilatation. There is mild dilatation of the main pulmonary trunk suggestive of pulmonary hypertension. Mediastinum/Nodes: No hilar or mediastinal adenopathy. The esophagus and the thyroid gland are grossly unremarkable no mediastinal fluid collection. Lungs/Pleura: Small bilateral pleural effusions with partial compressive atelectasis of the lower lobes versus pneumonia. No pneumothorax. A 1 cm nodular density in the distal trachea (57/4) likely a mucous secretion. A polyp is less likely. Attention on follow-up imaging recommended. The central airways are patent. Upper Abdomen: Indeterminate 3.5 cm right adrenal nodule, possibly an adenoma. MRI can provide better evaluation. Musculoskeletal: Osteopenia with degenerative changes spine. No acute osseous pathology. A 2.5 cm probable sebaceous cyst the subcutaneous soft tissues of the posterior thoracic wall to the right of the midline. IMPRESSION: 1. Small bilateral pleural effusions with partial compressive atelectasis of the lower lobes versus pneumonia. 2.  Aortic Atherosclerosis (ICD10-I70.0). Electronically Signed   By: Vanetta Chou M.D.   On: 03/07/2024 18:40   ECHOCARDIOGRAM LIMITED Result Date: 03/07/2024    ECHOCARDIOGRAM LIMITED REPORT   Patient Name:   Dave Brown Date of Exam: 03/07/2024 Medical Rec #:  [id number]   Height:       69.0 in Accession #:    [id number]  Weight:       300.5 lb Date of Birth:  March 12, 1946    BSA:          2.456 m Patient Age:    78 years    BP:           86/46 mmHg Patient Gender: M           HR:           66 bpm. Exam Location:   [facility] Procedure: Limited Echo,  Cardiac Doppler and Intracardiac Opacification Agent            (Both Spectral and Color Flow Doppler were utilized during            procedure). Indications:    Shock R57.9  History:        Patient has prior history of Echocardiogram examinations, most                 recent 02/02/2024. CHF; Risk Factors:Sleep Apnea, Hypertension                 and Dyslipidemia. Hx of COVID-19.  Sonographer:    Aida Pizza RCS Referring Phys: [id number] TORIBIO JAYSON SHARPS IMPRESSIONS  1. Limited Echo to evaluate LV function.  2. No LV thrombus by Definity. Left ventricular ejection fraction, by estimation, is 60 to 65%. The left ventricle has normal function. The left ventricle has no regional wall motion abnormalities. 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 Left ventricular diastolic parameters are consistent with Grade III diastolic dysfunction (restrictive). Elevated left atrial pressure.  3. Right ventricular systolic function is normal. The right ventricular size is normal. Tricuspid regurgitation signal is inadequate for assessing PA pressure.  4. Aortic dilatation noted. There is mild dilatation of the aortic root, measuring 40 mm.  5. The inferior vena cava is dilated in size with <50% respiratory variability, suggesting right atrial pressure of 15 mmHg. Comparison(s): No significant change from prior study. FINDINGS  Left Ventricle: No LV thrombus by Definity. Left ventricular ejection fraction, by estimation, is 60 to 65%. The left ventricle has normal function. The left ventricle has no regional wall motion abnormalities. Definity contrast agent was given IV to delineate the left ventricular endocardial borders. The left ventricular internal cavity size was normal in size. 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 Left ventricular diastolic parameters are consistent with Grade III diastolic dysfunction (restrictive). Elevated left  atrial pressure. Right Ventricle: The right ventricular size is normal. No increase in right ventricular wall thickness. Right ventricular systolic function is normal. Tricuspid regurgitation signal is inadequate for assessing PA pressure. Left Atrium: Left atrial size was normal in size. Right [facility]: Right atrial size was normal in size. Pericardium: There is no evidence of pericardial effusion. Mitral Valve: The mitral valve is normal in structure. MV peak gradient, 4.2 mmHg. The mean mitral valve gradient is 1.0 mmHg. Tricuspid Valve: The tricuspid valve is normal in structure. Aortic Valve: The aortic valve is tricuspid. Pulmonic Valve: The pulmonic valve was normal in structure. Aorta: Aortic dilatation noted. There is mild dilatation of the aortic root, measuring 40 mm. Venous: The inferior vena cava is dilated in size with less than 50% respiratory variability, suggesting right atrial pressure of 15 mmHg. IAS/Shunts: No atrial level shunt detected by color flow Doppler. LEFT VENTRICLE PLAX 2D LVIDd:         3.80 cm LVIDs:         2.40 cm LV PW:         1.60 cm LV IVS:        1.70 cm LVOT diam:     1.90 cm LVOT Area:     2.84 cm  RIGHT VENTRICLE TAPSE (M-mode): 1.4 cm LEFT ATRIUM         Index LA diam:    3.70 cm 1.51 cm/m   AORTA Ao Root diam: 4.00 cm MITRAL VALVE MV Area (PHT): 4.89 cm     SHUNTS MV  Peak grad:  4.2 mmHg     Systemic Diam: 1.90 cm MV Mean grad:  1.0 mmHg MV Vmax:       1.03 m/s MV Vmean:      54.2 cm/s MV Decel Time: 155 msec MV E velocity: 109.00 cm/s MV A velocity: 52.40 cm/s MV E/A ratio:  2.08 Vishnu Priya Mallipeddi Electronically signed by Diannah Late Mallipeddi Signature Date/Time: 03/07/2024/4:59:24 PM    Final    DG Chest Port 1 View Result Date: 03/06/2024 EXAM: 1 VIEW(S) XRAY OF THE CHEST 03/06/2024 09:53:00 PM COMPARISON: Comparison with 02/01/2024. CLINICAL HISTORY: SOB. FINDINGS: LUNGS AND PLEURA: Low lung volumes. Pulmonary vascularity is accentuated. Patchy airspace  opacities in the right mid and lower lung with airspace opacities in the left lower lung. Small right pleural effusion. No pneumothorax. HEART AND MEDIASTINUM: Stable cardiomegaly. Aortic atherosclerotic calcification. The cardiomediastinal silhouette is accentuated. BONES AND SOFT TISSUES: No acute osseous abnormality. IMPRESSION: 1. Findings concerning for multifocal pneumonia. Small pleural effusion. Electronically signed by: Norman Gatlin MD 03/06/2024 09:59 PM EST RP Workstation: [id number]    Alm Schneider, DO  [facility]  If 7PM-7AM, please contact night-coverage [email or web address] Password [email or web address] 03/08/2024, 11:17 AM   LOS: 1 day

## 2024-03-08 NOTE — Procedures (Signed)
 CENTRAL VENOUS CATHETER INSERTION PROCEDURE NOTE  Dave Brown  [id number]  Jun 18, 1945  Date:03/08/24  Time:9:02 PM   Provider Performing:Shanyiah Conde A Stevey Stapleton   Procedure: Insertion of Non-tunneled Central Venous [phone]) with US  guidance (23062)   Indication(s) Hemodialysis  Consent Risks of the procedure as well as the alternatives and risks of each were explained to the patient and/or caregiver.  Consent for the procedure was obtained and is signed in the bedside chart  Anesthesia Topical only with 1% lidocaine   Timeout Verified patient identification, verified procedure, site/side was marked, verified correct patient position, special equipment/implants available, medications/allergies/relevant history reviewed, required imaging and test results available.  Sterile Technique Maximal sterile technique including full sterile barrier drape, hand hygiene, sterile gown, sterile gloves, mask, hair covering, sterile ultrasound probe cover (if used).  Procedure Description Area of catheter insertion was cleaned with chlorhexidine and draped in sterile fashion.  With real-time ultrasound guidance a central venous catheter was placed into the left femoral vein. Nonpulsatile blood flow and easy flushing noted in all ports.  The catheter was sutured in place and sterile dressing applied.  Complications/Tolerance None; patient tolerated the procedure well. Chest X-ray is ordered to verify placement for internal jugular or subclavian cannulation.   Chest x-ray is not ordered for femoral cannulation.  EBL Minimal  Specimen(s) None     Almarie Nose DNP, CCRN, FNP-C, AGACNP-BC Acute Care & Family Nurse Practitioner [facility] PCCM on call pager [phone]

## 2024-03-08 NOTE — H&P (Incomplete)
 NAME:  Dave Brown, MRN:  [id number], DOB:  Dec 04, 1945, LOS: 1 ADMISSION DATE:  03/06/2024, CONSULTATION DATE:  03/08/2024 REFERRING MD:  Dr. Evonnie, CHIEF COMPLAINT:  Shortness of breath, lower extremity edema   Brief Pt Description / Synopsis:  78 y.o. male with PMHx significant for HFpEF and COPD requiring 2L supplemental oxygen  who is transferred from [facility] with Acute on Chronic Hypoxic Respiratory Failure in the setting of Acute Decompensated HFpEF, circulatory shock, and AKI requiring initiation of CRRT.   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 He denied any fevers, chills, chest pain.    Of note, the patient was recently admitted to the hospital from 02/01/2024 to 02/05/2024 for respiratory failure due to CHF.  The patient did not require oxygen  at the time of his discharge.  His discharge weight was 286.  At his last office visit with his primary provider his weight was 291 pounds on 02/22/24.  Follow up with pulmonary on 03/04/24 office weight 29.   He was discharged home with furosemide  40 mg daily during last hospitalization   ED Course: Initial Vital Signs: afebrile and hemodynamically stable. Oxygen  saturation was 93-97% on 4 L  Significant Labs: WBC 11.0, hemoglobin 12.3, platelets 138. Sodium 141, potassium 4.1, bicarbonate 31, serum creatinine 4.15. proBNP 16,139. PCT 0.13. Lactic acid 1.9 >> 0.8. COVID-negative  Imaging Chest X-ray>>patchy opacities right lower lobe and left lower lobe  Medications Administered: furosemide  40 mg IV, Solu-Medrol  125 mg, ceftriaxone, and azithromycin. He was given albuterol  and Atrovent.   [facility] asked to admit for further workup and  treatment.  He gradually became hypotensive requiring initiation of low dose Levophed (3 to 5 mcg), PCCM was consulted.  Nephrology was consulted for oliguria. Case was discussed with nephrology and it was felt the patient need to be initiated on CRRT. Subsequently, initial request was made to transfer the patient to [facility]. In order to expedite care, he is being transferred to [facility] regional ICU to critical care service where nephrology will be consulted for continued management of his renal failure and fluid overload.   Please see Significant Hospital Events section below for full detailed hospital course.   Pertinent  Medical History   Past Medical History:  Diagnosis Date   CHF (congestive heart failure) (HCC)    Degenerative joint disease    High cholesterol    Hypertension    Kidney stones    Obesity    Respiratory failure with hypoxia (HCC)    Sleep apnea     Micro Data:  11/12: COVID/FLU/RSV PCR>> negative 11/12: Blood cultures>> no growth to date 11/13: RVP >> negative  11/13: MRSA PCR>> negative   Antimicrobials:   Anti-infectives (From admission, onward)    Start     Dose/Rate Route Frequency Ordered Stop   03/07/24 1445  piperacillin-tazobactam (ZOSYN) 2.25 g in sodium chloride  0.9 % 50 mL IVPB  Status:  Discontinued        2.25 g 100 mL/hr over 30 Minutes Intravenous Every 8 hours 03/07/24 1359 03/07/24 1403   03/07/24 1430  piperacillin-tazobactam (ZOSYN) IVPB 2.25 g  Status:  Discontinued        2.25 g 100 mL/hr over  30 Minutes Intravenous Every 8 hours 03/07/24 1336 03/07/24 1359   03/06/24 2245  cefTRIAXone (ROCEPHIN) 1 g in sodium chloride  0.9 % 100 mL IVPB        1 g 200 mL/hr over 30 Minutes Intravenous  Once 03/06/24 2237 03/06/24 2351   03/06/24 2245  azithromycin (ZITHROMAX) 500 mg in sodium chloride  0.9 % 250 mL IVPB        500 mg 250 mL/hr over 60 Minutes Intravenous  Once 03/06/24 2237 03/07/24 0026        Significant Hospital  Events: Including procedures, antibiotic start and stop dates in addition to other pertinent events   11/12: Admitted to [facility] to [place] for treatment of Acute Decompensated HFpEF and AKI. 11/13: BP decreased, started on Levophed.  PCCM consulted.  ABX discontinued. 11/14: Transfer to [facility] for initiation of CRRT.   Interim History / Subjective:  As outlined above under Significant Hospital Events section  Objective   Blood pressure (!) 102/53, pulse (!) 59, temperature (!) 97.4 F (36.3 C), temperature source Axillary, resp. rate (!) 24, height 5' 9 (1.753 m), weight (!) 139.8 kg, SpO2 91%.        Intake/Output Summary (Last 24 hours) at 03/08/2024 1832 Last data filed at 03/08/2024 1625 Gross per 24 hour  Intake 492.21 ml  Output 100 ml  Net 392.21 ml   Filed Weights   03/06/24 2127 03/07/24 0209 03/08/24 0445  Weight: 133 kg (!) 136.3 kg (!) 139.8 kg    Examination: General: *** HENT: *** Lungs: *** Cardiovascular: *** Abdomen: *** Extremities: *** Neuro: *** GU: ***  Resolved Hospital Problem list     Assessment & Plan:   #Acute Decompensated HFpEF #Shock: Cardiogenic vs Septic  #Elevated Troponin, suspect demand ischemia PMHx: HTN 02/02/2024 echo EF 60 to 65%, normal RVF, grade 3 DD  -Continuous cardiac monitoring -Maintain MAP >65 -Vasopressors as needed to maintain MAP goal -Trend lactic acid until normalized -Trend HS Troponin until peaked -Diuresis as BP and renal function permits ~ likely needs CRRT for further volume removal   #Acute on Chronic Hypoxic Respiratory  #Pulmonary Edema #COPD without acute exacerbation  PMHx: COPD on 2L supplemental O2 CT chest--sm bilateral pleural effusions with bibasilar atelectasis  -Supplemental O2 as needed to maintain O2 sats >92% -Follow intermittent Chest X-ray & ABG as needed -Bronchodilators prn -ABX as above -Diuresis as BP and renal function permits ~ may need CRRT for further volume removal   -Pulmonary toilet as able  #Questionable Pneumonia ~ low suspicion -Monitor fever curve -Trend WBC's & Procalcitonin -Follow cultures as above -Empiric ABX discontinued pending cultures & sensitivities  #AKI on CKD Stage IV -Monitor I&O's / urinary output -Follow BMP -Ensure adequate renal perfusion -Avoid nephrotoxic agents as able -Replace electrolytes as indicated ~ Pharmacy following for assistance with electrolyte replacement -Nephrology consulted, appreciate input ~ tentative plan to start CRRT  #Diabetes Mellitus  -CBG's q4h; Target range of 140 to 180 -SSI -Follow ICU Hypo/Hyperglycemia protocol       Best Practice (right click and Reselect all SmartList Selections daily)   Diet/type: {diet type:25684} DVT prophylaxis: {anticoagulation (Optional):25687} GI prophylaxis: {HP:73065} Lines: {Central Venous Access:25771} Foley:  {Central Venous Access:25691} Code Status:  {Code Status:26939} Last date of multidisciplinary goals of care discussion [***]  ***: Pt and family updated at bedside on plan of care.  Labs   CBC: Recent Labs  Lab 03/06/24 2129 03/07/24 0442 03/08/24 0311  WBC 11.0* 10.5 12.7*  HGB 12.3* 11.7*  11.7*  HCT 40.9 39.3 38.6*  MCV 108.8* 109.5* 107.2*  PLT 138* 121* 139*    Basic Metabolic Panel: Recent Labs  Lab 03/06/24 2129 03/07/24 0442 03/08/24 0311  NA 141 141 136  K 4.1 4.1 4.8  CL 102 101 97*  CO2 31 26 29   GLUCOSE 48* 70 141*  BUN 46* 51* 64*  CREATININE 4.15* 4.58* 5.86*  CALCIUM  9.0 8.8* 8.9  MG  --  2.6*  --   PHOS  --  5.7* 7.3*   GFR: Estimated Creatinine Clearance: 14.4 mL/min (A) (by C-G formula based on SCr of 5.86 mg/dL (H)). Recent Labs  Lab 03/06/24 2129 03/06/24 2255 03/07/24 0442 03/08/24 0311  PROCALCITON  --   --  0.13  --   WBC 11.0*  --  10.5 12.7*  LATICACIDVEN 1.0 0.8  --   --     Liver Function Tests: Recent Labs  Lab 03/06/24 2129 03/07/24 0442 03/08/24 0311  AST 20 23  --    ALT 18 18  --   ALKPHOS 91 88  --   BILITOT 0.3 0.3  --   PROT 6.7 6.7  --   ALBUMIN 3.7 3.7 3.8   No results for input(s): LIPASE, AMYLASE in the last 168 hours. No results for input(s): AMMONIA in the last 168 hours.  ABG No results found for: PHART, PCO2ART, PO2ART, HCO3, TCO2, ACIDBASEDEF, O2SAT   Coagulation Profile: No results for input(s): INR, PROTIME in the last 168 hours.  Cardiac Enzymes: No results for input(s): CKTOTAL, CKMB, CKMBINDEX, TROPONINI in the last 168 hours.  HbA1C: Hgb A1c MFr Bld  Date/Time Value Ref Range Status  03/08/2024 03:11 AM 5.2 4.8 - 5.6 % Final    Comment:    (NOTE) Diagnosis of Diabetes The following HbA1c ranges recommended by the American Diabetes Association (ADA) may be used as an aid in the diagnosis of diabetes mellitus.  Hemoglobin             Suggested A1C NGSP%              Diagnosis  <5.7                   Non Diabetic  5.7-6.4                Pre-Diabetic  >6.4                   Diabetic  <7.0                   Glycemic control for                       adults with diabetes.    05/02/2020 02:37 PM 6.9 (H) 4.8 - 5.6 % Final    Comment:    (NOTE) Pre diabetes:          5.7%-6.4%  Diabetes:              >6.4%  Glycemic control for   <7.0% adults with diabetes     CBG: Recent Labs  Lab 03/07/24 1633 03/07/24 2001 03/08/24 0007 03/08/24 0340 03/08/24 0750  GLUCAP 118* 138* 143* 152* 202*    Review of Systems:   Positives in BOLD: Gen: Denies fever, chills, weight change, fatigue, night sweats HEENT: Denies blurred vision, double vision, hearing loss, tinnitus, sinus congestion, rhinorrhea, sore throat, neck stiffness, dysphagia PULM: Denies shortness of breath, cough, sputum production, hemoptysis,  wheezing CV: Denies chest pain, edema, orthopnea, paroxysmal nocturnal dyspnea, palpitations GI: Denies abdominal pain, nausea, vomiting, diarrhea, hematochezia, melena,  constipation, change in bowel habits GU: Denies dysuria, hematuria, polyuria, oliguria, urethral discharge Endocrine: Denies hot or cold intolerance, polyuria, polyphagia or appetite change Derm: Denies rash, dry skin, scaling or peeling skin change Heme: Denies easy bruising, bleeding, bleeding gums Neuro: Denies headache, numbness, weakness, slurred speech, loss of memory or consciousness   Past Medical History:  He,  has a past medical history of CHF (congestive heart failure) (HCC), Degenerative joint disease, High cholesterol, Hypertension, Kidney stones, Obesity, Respiratory failure with hypoxia (HCC), and Sleep apnea.   Surgical History:  History reviewed. No pertinent surgical history.   Social History:   reports that he quit smoking about 25 years ago. His smoking use included cigars. He does not have any smokeless tobacco history on file. He reports that he does not drink alcohol and does not use drugs.   Family History:  His family history is not on file.   Allergies No Known Allergies   Home Medications  Prior to Admission medications   Medication Sig Start Date End Date Taking? Authorizing Provider  albuterol  (PROVENTIL ) (2.5 MG/3ML) 0.083% nebulizer solution Take 2.5 mg by nebulization every 6 (six) hours as needed for shortness of breath or wheezing. 03/04/24  Yes [provider]  budesonide-formoterol (SYMBICORT) 160-4.5 MCG/ACT inhaler Inhale 2 puffs into the lungs in the morning and at bedtime. 03/04/24 03/04/25 Yes [provider]  furosemide  (LASIX ) 40 MG tablet Take 1 tablet (40 mg total) by mouth daily. PT. MUST MAKE AN APPOINTMENT IN ORDER TO RECEIVE FUTURE REFILLS. FIRST ATTEMPT. 09/20/23  Yes [name], [name], MD  glimepiride (AMARYL) 1 MG tablet Take 1 mg by mouth daily with breakfast.   Yes [provider]  lisinopril (ZESTRIL) 10 MG tablet Take 10 mg by mouth daily. 01/03/22  Yes [provider]  metoprolol  succinate  (TOPROL -XL) 50 MG 24 hr tablet Take 50 mg by mouth in the morning. 05/27/12  Yes [provider]  rosuvastatin  (CRESTOR ) 10 MG tablet Take 10 mg by mouth daily.   Yes [provider]  sertraline  (ZOLOFT ) 25 MG tablet Take 25 mg by mouth in the morning.   Yes [provider]  TYLENOL  325 MG tablet Take 325-650 mg by mouth every 8 (eight) hours as needed for mild pain (pain score 1-3) (or headaches).   Yes [provider]  hydrocortisone sodium succinate (SOLU-CORTEF) 100 MG injection Inject 2 mLs (100 mg total) into the vein every 8 (eight) hours. 03/08/24   Evonnie Lenis, MD  midodrine (PROAMATINE) 10 MG tablet Take 1 tablet (10 mg total) by mouth 3 (three) times daily with meals. 03/08/24   Evonnie Lenis, MD  norepinephrine (LEVOPHED) 4-5 MG/250ML-% SOLN Inject 0-10 mcg/min into the vein continuous. 03/08/24   Evonnie Lenis, MD     Critical care time: 60 minutes

## 2024-03-08 NOTE — Progress Notes (Signed)
 [facility] arrived with patient. Patient placed on monitor, vitals taken. He is currently on 4 liters of oxygen . Patient is on levophed-  q15 vitals. Patient is lethargic and unable to answer questions appropriately. Dr. Isadora notified, he states he will enter ABG. Report given to [employer].

## 2024-03-08 NOTE — Progress Notes (Signed)
 Updated provider, Erminio Cone, NP, about pt's anuria this shift.  Levo is currently off.  Bladder scan revealed @ 0500.  Pt does not c/o need to urinate or pain.

## 2024-03-08 NOTE — H&P (Signed)
 NAME:  Dave Brown, MRN:  [id number], DOB:  April 07, 1946, LOS: 0 ADMISSION DATE:  03/08/2024, CONSULTATION DATE:  03/08/2024 REFERRING MD:  Dr. Evonnie, CHIEF COMPLAINT:  Shortness of breath, lower extremity edema   Brief Pt Description / Synopsis:  78 y.o. male with PMHx significant for HFpEF and COPD requiring 2L supplemental oxygen  who is transferred from [facility] with Acute on Chronic Hypoxic Respiratory Failure in the setting of Acute Decompensated HFpEF, circulatory shock, and AKI requiring initiation of CRRT.   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 He denied any fevers, chills, chest pain.    Of note, the patient was recently admitted to the hospital from 02/01/2024 to 02/05/2024 for respiratory failure due to CHF.  The patient did not require oxygen  at the time of his discharge.  His discharge weight was 286.  At his last office visit with his primary provider his weight was 291 pounds on 02/22/24.  Follow up with pulmonary on 03/04/24 office weight 29.   He was discharged home with furosemide  40 mg daily during last hospitalization  ED Course: Initial Vital Signs: afebrile and hemodynamically stable. Oxygen  saturation was 93-97% on 4 L  Significant Labs: WBC 11.0, hemoglobin 12.3, platelets 138. Sodium 141, potassium 4.1, bicarbonate 31, serum creatinine 4.15. proBNP 16,139. PCT 0.13. Lactic acid 1.9 >> 0.8. COVID-negative  Imaging Chest X-ray>>patchy opacities right lower lobe and left lower lobe  Medications Administered: furosemide  40 mg IV, Solu-Medrol  125 mg, ceftriaxone, and azithromycin. He was given albuterol  and Atrovent.   [facility] asked to admit for further workup and treatment.   He gradually became hypotensive requiring initiation of low dose Levophed (3 to 5 mcg), PCCM was consulted.  Nephrology was consulted for oliguria. Case was discussed with nephrology and it was felt the patient need to be initiated on CRRT. Subsequently, initial request was made to transfer the patient to [facility]. In order to expedite care, he is being transferred to [facility] regional ICU to critical care service where nephrology will be consulted for continued management of his renal failure and fluid overload.   Please see Significant Hospital Events section below for full detailed hospital course.  Pertinent  Medical History   Past Medical History:  Diagnosis Date   CHF (congestive heart failure) (HCC)    Degenerative joint disease    High cholesterol    Hypertension    Kidney stones    Obesity    Respiratory failure with hypoxia (HCC)    Sleep apnea    Micro Data:  11/12: COVID/FLU/RSV PCR>> negative 11/12: Blood cultures>> no growth to date 11/13: RVP >> negative  11/13: MRSA PCR>> negative   Antimicrobials:   Anti-infectives (From admission, onward)    None      Significant Hospital Events: Including procedures, antibiotic start and stop dates in addition to other pertinent events   11/12: Admitted to [facility] to [place] for treatment of Acute Decompensated HFpEF and AKI. 11/13: BP decreased, started on Levophed.  PCCM consulted.  ABX discontinued. 11/14: Transfer to [facility] for initiation of CRRT.   Interim History / Subjective:  As outlined above under Significant Hospital Events section  Objective   Blood pressure 91/60, temperature  98 F (36.7 C), temperature source Axillary, resp. rate (!) 28, weight (!) 144.1 kg, SpO2 96%.    FiO2 (%):  [60 %] 60 % PEEP:  [5 cmH20] 5 cmH20 Pressure Support:  [15 cmH20] 15 cmH20  No intake or output data in the 24 hours ending 03/08/24 2106  Filed Weights   03/08/24 1842  Weight: (!) 144.1 kg   Physical  Examination: GEN: Critically ill patient, WDWN in NAD on BiPAP HEENT: [place]/AT. PERRL, sclerae anicteric. HEART: regular rhythm, normal rate, S1, S2, no M/R/G,  LUNGS: CTAB, mild crackles without wheezes, no increased WOB,  EXTREMITIES: Generalized edema  NEURO: No gross focal deficits. PSYCH:  UTA ABDOMINAL: Soft: BS x 4, NTND SKIN: Intact, warm, no rashes lesion, or ulcer  Resolved Hospital Problem list     Assessment & Plan:   #Acute Decompensated HFpEF #Shock: Cardiogenic vs Septic  #Elevated Troponin, suspect demand ischemia PMHx: HTN 02/02/2024 echo EF 60 to 65%, normal RVF, grade 3 DD  -Volume overloaded on exam.  -pro BNP(11/12): 16139 -trend troponin and lactate -Chest XR/CT (): m bilateral pleural effusions with bibasilar atelectasis  -ECHO (11/13): LVEF 60-65%, no WMA, no pericardial effusion  -Hold GDMT  -HD/CRRT for volume removal -Cardiology consult  #Acute on Chronic Hypoxic Respiratory  #Pulmonary Edema #COPD without acute exacerbation  PMHx: COPD on 2L supplemental O2 CT chest--sm bilateral pleural effusions with bibasilar atelectasis  -Supplemental O2 as needed titrate to goal 88-92% -BiPAP, wean as tolerated -High risk for intubation -Follow intermittent Chest X-ray & ABG as needed -Bronchodilators prn -ABX as above -Diuresis as BP and renal function permits ~ CRRT for further volume removal   #Questionable Pneumonia ~ low suspicion -F/u cultures, trend lactic/ PCT -Monitor WBC/ fever curve -Obtain MRSA nasal swab, RVP, COVID neg -previously treated with Ceftriaxone and Azithromycin, hold abx for now -Gentle IVF hydration as needed -Pressors PRN for MAP goal >65  #AKI on CKD Stage IV -Monitor I&O's / urinary output -Follow BMP -Ensure adequate renal perfusion -Avoid nephrotoxic agents as able -Replace electrolytes as indicated ~ Pharmacy following for assistance with electrolyte replacement -Nephrology consulted, appreciate input ~ tentative  plan to start CRRT  #Diabetes Mellitus  -CBG's q4h; Target range of 140 to 180 -SSI -Follow ICU Hypo/Hyperglycemia protocol  Best Practice (right click and Reselect all SmartList Selections daily)   Diet/type: NPO DVT prophylaxis: prophylactic heparin  GI prophylaxis: PPI Lines: Central line and Dialysis Catheter Foley:  Yes, and it is still needed Code Status:  full code Last date of multidisciplinary goals of care discussion [03/08/24]  11/14: Pt and family updated on plan of care, spoke with daughter over the phone and confirmed with her that patient was transferred for urgent CRRT  Labs   CBC: Recent Labs  Lab 03/06/24 2129 03/07/24 0442 03/08/24 0311 03/08/24 1932  WBC 11.0* 10.5 12.7* 17.1*  NEUTROABS  --   --   --  15.3*  HGB 12.3* 11.7* 11.7* 12.0*  HCT 40.9 39.3 38.6* 39.8  MCV 108.8* 109.5* 107.2* 107.6*  PLT 138* 121* 139* 179    Basic Metabolic Panel: Recent Labs  Lab 03/06/24 2129 03/07/24 0442 03/08/24 0311 03/08/24 1932  NA 141 141 136 133*  K 4.1 4.1 4.8 5.4*  CL 102 101 97* 94*  CO2 31 26 29 27   GLUCOSE 48* 70 141* 142*  BUN 46* 51* 64* 74*  CREATININE 4.15* 4.58* 5.86* 6.62*  CALCIUM  9.0 8.8* 8.9 9.1  MG  --  2.6*  --  2.7*  PHOS  --  5.7* 7.3* 8.6*   GFR: Estimated Creatinine Clearance: 13 mL/min (A) (by C-G formula based on SCr of 6.62 mg/dL (H)). Recent Labs  Lab 03/06/24 2129 03/06/24 2255 03/07/24 0442 03/08/24 0311 03/08/24 1932  PROCALCITON  --   --  0.13  --   --   WBC 11.0*  --  10.5 12.7* 17.1*  LATICACIDVEN 1.0 0.8  --   --  0.8    Liver Function Tests: Recent Labs  Lab 03/06/24 2129 03/07/24 0442 03/08/24 0311 03/08/24 1932  AST 20 23  --  23  ALT 18 18  --  20  ALKPHOS 91 88  --  90  BILITOT 0.3 0.3  --  0.4  PROT 6.7 6.7  --  7.0  ALBUMIN 3.7 3.7 3.8 3.9   No results for input(s): LIPASE, AMYLASE in the last 168 hours. No results for input(s): AMMONIA in the last 168 hours.  ABG    Component  Value Date/Time   PHART 7.14 (LL) 03/08/2024 1917   PCO2ART 82 (HH) 03/08/2024 1917   PO2ART 91 03/08/2024 1917   HCO3 27.9 03/08/2024 1917   ACIDBASEDEF 3.2 (H) 03/08/2024 1917   O2SAT 98.8 03/08/2024 1917     Coagulation Profile: Recent Labs  Lab 03/08/24 1932  INR 1.0    Cardiac Enzymes: No results for input(s): CKTOTAL, CKMB, CKMBINDEX, TROPONINI in the last 168 hours.  HbA1C: Hgb A1c MFr Bld  Date/Time Value Ref Range Status  03/08/2024 03:11 AM 5.2 4.8 - 5.6 % Final    Comment:    (NOTE) Diagnosis of Diabetes The following HbA1c ranges recommended by the American Diabetes Association (ADA) may be used as an aid in the diagnosis of diabetes mellitus.  Hemoglobin             Suggested A1C NGSP%              Diagnosis  <5.7                   Non Diabetic  5.7-6.4                Pre-Diabetic  >6.4                   Diabetic  <7.0                   Glycemic control for                       adults with diabetes.    05/02/2020 02:37 PM 6.9 (H) 4.8 - 5.6 % Final    Comment:    (NOTE) Pre diabetes:          5.7%-6.4%  Diabetes:              >6.4%  Glycemic control for   <7.0% adults with diabetes     CBG: Recent Labs  Lab 03/08/24 0007 03/08/24 0340 03/08/24 0750 03/08/24 1847 03/08/24 2006  GLUCAP 143* 152* 202* 116* 131*    Review of Systems:   Positives in BOLD: Gen: Denies fever, chills, weight change, fatigue, night sweats HEENT: Denies blurred vision, double vision, hearing loss, tinnitus, sinus congestion, rhinorrhea, sore throat, neck stiffness, dysphagia PULM: Denies shortness of breath, cough, sputum production, hemoptysis, wheezing CV: Denies chest pain, edema, orthopnea, paroxysmal nocturnal dyspnea, palpitations GI: Denies abdominal pain, nausea, vomiting, diarrhea, hematochezia, melena, constipation, change in bowel habits GU: Denies dysuria,  hematuria, polyuria, oliguria, urethral discharge Endocrine: Denies hot or cold  intolerance, polyuria, polyphagia or appetite change Derm: Denies rash, dry skin, scaling or peeling skin change Heme: Denies easy bruising, bleeding, bleeding gums Neuro: Denies headache, numbness, weakness, slurred speech, loss of memory or consciousness  Past Medical History:  He,  has a past medical history of CHF (congestive heart failure) (HCC), Degenerative joint disease, High cholesterol, Hypertension, Kidney stones, Obesity, Respiratory failure with hypoxia (HCC), and Sleep apnea.   Surgical History:  No past surgical history on file.   Social History:   reports that he quit smoking about 25 years ago. His smoking use included cigars. He does not have any smokeless tobacco history on file. He reports that he does not drink alcohol and does not use drugs.   Family History:  His family history is not on file.   Allergies No Known Allergies   Home Medications  Prior to Admission medications   Medication Sig Start Date End Date Taking? Authorizing Provider  albuterol  (PROVENTIL ) (2.5 MG/3ML) 0.083% nebulizer solution Take 2.5 mg by nebulization every 6 (six) hours as needed for shortness of breath or wheezing. 03/04/24  Yes [provider]  budesonide-formoterol (SYMBICORT) 160-4.5 MCG/ACT inhaler Inhale 2 puffs into the lungs in the morning and at bedtime. 03/04/24 03/04/25 Yes [provider]  furosemide  (LASIX ) 40 MG tablet Take 1 tablet (40 mg total) by mouth daily. PT. MUST MAKE AN APPOINTMENT IN ORDER TO RECEIVE FUTURE REFILLS. FIRST ATTEMPT. 09/20/23  Yes [name], [name], MD  glimepiride (AMARYL) 1 MG tablet Take 1 mg by mouth daily with breakfast.   Yes [provider]  lisinopril (ZESTRIL) 10 MG tablet Take 10 mg by mouth daily. 01/03/22  Yes [provider]  metoprolol  succinate (TOPROL -XL) 50 MG 24 hr tablet Take 50 mg by mouth in the morning. 05/27/12  Yes [provider]  rosuvastatin  (CRESTOR ) 10 MG tablet Take 10 mg by  mouth daily.   Yes [provider]  sertraline  (ZOLOFT ) 25 MG tablet Take 25 mg by mouth in the morning.   Yes [provider]  TYLENOL  325 MG tablet Take 325-650 mg by mouth every 8 (eight) hours as needed for mild pain (pain score 1-3) (or headaches).   Yes [provider]  hydrocortisone sodium succinate (SOLU-CORTEF) 100 MG injection Inject 2 mLs (100 mg total) into the vein every 8 (eight) hours. 03/08/24   Evonnie Lenis, MD  midodrine (PROAMATINE) 10 MG tablet Take 1 tablet (10 mg total) by mouth 3 (three) times daily with meals. 03/08/24   Evonnie Lenis, MD  norepinephrine (LEVOPHED) 4-5 MG/250ML-% SOLN Inject 0-10 mcg/min into the vein continuous. 03/08/24   Evonnie Lenis, MD  Scheduled Meds:  Chlorhexidine Gluconate Cloth  6 each Topical Daily   insulin  aspart  0-6 Units Subcutaneous Q4H   ipratropium-albuterol   3 mL Nebulization Q6H   Continuous Infusions:  norepinephrine (LEVOPHED) Adult infusion     prismasol BGK 4/2.5     prismasol BGK 4/2.5     prismasol BGK 4/2.5     PRN Meds:.docusate sodium, heparin, ipratropium-albuterol , polyethylene glycol   Critical care time: 60 minutes       Almarie Nose DNP, CCRN, FNP-C, AGACNP-BC Acute Care & Family Nurse Practitioner [facility] PCCM on call pager [phone]

## 2024-03-08 NOTE — Progress Notes (Signed)
 Notes from nephrology at [facility] reviewed.  BUN currently 64 with a creatinine of 5.86 eGFR 9 and potassium 4.8.  ABG shows pH of 7.14 pCO2 of 82 pO2 of 91.  We will proceed with initiation of CRRT once access is available.  We will plan for 4K bath with initial fluid removal goal of net even since the patient is requiring pressors.  Hopefully UF target can be increased tomorrow if blood pressure stabilizes.

## 2024-03-08 NOTE — Discharge Summary (Signed)
 Physician Discharge Summary   Patient: Dave Brown MRN: [id number] DOB: July 10, 1945  Admit date:     03/06/2024  Discharge date: 03/08/24  Discharge Physician: Alm Casy Tavano   PCP: Debrah Josette ORN., PA-C   TRANSFER TO [facility]  Discharge Diagnoses: Active Problems:   Stage 4 chronic kidney disease (HCC)   Acute on chronic heart failure with preserved ejection fraction (HFpEF) (HCC)   Acute renal failure superimposed on stage 4 chronic kidney disease (HCC)   Obesity, Class III, BMI 40-49.9 (morbid obesity) (HCC)   Lobar pneumonia   Essential hypertension    Hospital Course: 78 year old male with a history of HFpEF,, hypertension, hyperlipidemia, CKD stage IV, OSA, 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  Patient denies any fevers, chills, chest pain.  He complains of orthopnea and mild increase of his lower extremity edema. The patient was recently admitted to the hospital from 02/01/2024 to 02/05/2024 for respiratory failure due to CHF.  The patient did not require oxygen  at the time of his discharge.  His discharge weight was 286.  At his last office visit with his primary provider his weight was 291 pounds on 02/22/24.  Follow up with pulmonary on 03/04/24 office weight 29.   He was discharged home with furosemide  40 mg daily during last hospitalization.  Since the patient is discharged he has followed up with pulmonary medicine at [facility] on 03/04/2024.   He has a history of smoking several packs of cigars daily during his youth, transitioning from cigarettes to cigars, and has abstained from smoking for the past 15 years.  It was noted the patient had a CPAP machine he has not used in 4 years. The patient was set up with home oxygen  2 L during that appointment.  He was noted to have worsening lower extremity edema during that appointment.  He was prescribed Symbicort.  His weight during that appointment was 297 pounds.  In  the ED, the patient was afebrile and hemodynamically stable.  Oxygen  saturation was 93-97% on 4 L.  WBC 11.0, hemoglobin 12.3, platelets 138.  Sodium 141, potassium 4.1, bicarbonate 31, serum creatinine 4.15.  proBNP 16,139.  PCT 0.13.  Lactic acid 1.9 >> 0.8.  COVID-negative chest x-ray showed patchy opacities right lower lobe and left lower lobe.  The patient was started furosemide  40 mg IV, Solu-Medrol  125 mg, ceftriaxone, and azithromycin.  He was given albuterol  and Atrovent. The patient gradually became hypotensive.  The patient was started on low-dose Levophed 3-5 mcg/kg/min.  Nephrology was consulted.  PCCM was consulted.  The patient became oliguric.  Renal ultrasound was negative for obstruction.  Case was discussed with nephrology and it was felt the patient need to be initiated on CRRT.  Subsequently, initial request was made to transfer the patient to [facility].  In order to expedite care, the patient will be transferred to [facility] regional ICU to critical care service where nephrology will be consulted for continued management of his renal failure and fluid overload.  Assessment and Plan: Acute on chronic respiratory failure with hypoxia - Chronically on 2 L at home - Currently on 4 L - Secondary to fluid overload - Wean oxygen  as tolerated back to baseline - CT chest--sm bilateral pleural effusions with bibasilar atelectasis   Acute on chronic HFpEF - 02/02/2024 echo EF 60 to 65%, normal RVF, grade 3 DD - Remains fluid overloaded - Continue IV furosemide  per nephrology - 03/07/24 Echo EF 60-65%,  no WMA, no pericardial effusion -11/14--Lasix  120 mg given IV   Acute on chronic renal failure--CKD stage IV - Baseline creatinine 2.5-2.8 - Nephrology consult appreciated - abd US --no hydronephrosis - discussed with Dr. Nancyann likely need to initiate CRRT - transfer to  - serum creatinine up to 5.86 - discussed with CCM, Dr. Lyndia hypotension on levophed and worsen  renal function>>ok to transfer to [facility] -Due to bed availability, case was discussed with CCM.  To expedite care, the patient will be transferred to [facility] regional ICU where he received continued care.   Pulmonary opacities - initially on  ceftriaxone and azithromycin - PCT 0.13 - CT chest--sm bilateral pleural effusions with bibasilar atelectasis -COVID/RSV/Flu neg -viral respiratory panel--neg -Antibiotics discontinued.   Elevated troponin - Secondary to demand ischemia - No chest pain presently - Troponin trend is flat   Diabetes mellitus type 2 with hyperglycemia - Patient was on Amaryl in the outpatient setting - Do not plan to restart Amaryl at this point - Checking CBGs - NovoLog  sliding scale - on D10W temporarily>>d/c - started on solucortef due to concern for AI   Morbid obesity - BMI 44.37 - Lifestyle modification   Essential hypertension - Holding metoprolol  succinate, lisinopril at this point secondary to soft BPs - now hypotensive one levophed  Hypotension -on levophed 11/13>> -solucortef started 11/13 -midodrine started 11/13>> - 03/07/24 Echo EF 60-65%, no WMA, no pericardial effusion   Thrombocytopenia - Appears to be chronic - B12--2517 - Folic acid--11.1 - TSH--0.608 - Splenomegaly noted on abdominal ultrasound   COPD -FEV1  03/04/2024 0.98 L   FEV1/FVC  03/04/2024 72 %  - Continue Symbicort or therapeutic equivalent        Consultants: PCCM, renal Procedures performed: femoral TLC--03/07/24  Disposition: TRANSFER TO [facility] ICU Diet recommendation:  RENAL DISCHARGE MEDICATION: Allergies as of 03/08/2024   No Known Allergies      Medication List     STOP taking these medications    budesonide-formoterol 160-4.5 MCG/ACT inhaler Commonly known as: SYMBICORT   furosemide  40 MG tablet Commonly known as: LASIX    glimepiride 1 MG tablet Commonly known as: AMARYL   lisinopril 10 MG tablet Commonly known as:  ZESTRIL   metoprolol  succinate 50 MG 24 hr tablet Commonly known as: TOPROL -XL       TAKE these medications    albuterol  (2.5 MG/3ML) 0.083% nebulizer solution Commonly known as: PROVENTIL  Take 2.5 mg by nebulization every 6 (six) hours as needed for shortness of breath or wheezing.   hydrocortisone sodium succinate 100 MG injection Commonly known as: SOLU-CORTEF Inject 2 mLs (100 mg total) into the vein every 8 (eight) hours.   midodrine 10 MG tablet Commonly known as: PROAMATINE Take 1 tablet (10 mg total) by mouth 3 (three) times daily with meals.   norepinephrine 4-5 MG/250ML-% Soln Commonly known as: LEVOPHED Inject 0-10 mcg/min into the vein continuous.   rosuvastatin  10 MG tablet Commonly known as: CRESTOR  Take 10 mg by mouth daily.   sertraline  25 MG tablet Commonly known as: ZOLOFT  Take 25 mg by mouth in the morning.   Tylenol  325 MG tablet Generic drug: acetaminophen  Take 325-650 mg by mouth every 8 (eight) hours as needed for mild pain (pain score 1-3) (or headaches).        Discharge Exam: Filed Weights   03/06/24 2127 03/07/24 0209 03/08/24 0445  Weight: 133 kg (!) 136.3 kg (!) 139.8 kg   HEENT:  [place]/AT, No thrush, no icterus CV:  RRR, no rub, no S3, no S4 Lung:  bilateral rales.  Abd:  soft/+BS, NT Ext: 2+LE edema, no lymphangitis, no synovitis, no rash   Condition at discharge: stable  The results of significant diagnostics from this hospitalization (including imaging, microbiology, ancillary and laboratory) are listed below for reference.   Imaging Studies: US  Abdomen Complete Result Date: 03/07/2024 EXAM: COMPLETE ABDOMINAL ULTRASOUND TECHNIQUE: Real-time ultrasonography of the abdomen was performed. COMPARISON: None available. CLINICAL HISTORY: Acute kidney injury, thrombocytopenia. FINDINGS: LIVER: Hepatic steatosis. No intrahepatic biliary ductal dilatation. Patent portal vein with antegrade flow. BILIARY SYSTEM: The gallbladder was  unable to be visualized due to body habitus and difficulty with positioning. The common bile duct measures 4 mm. No cholelithiasis. KIDNEYS: Right kidney measures 8.2 cm in length. Left kidney measures 9.9 cm in length. There is increased cortical echogenicity in both kidneys. A 2.8 cm cyst is present in the right kidney, which does not require follow up. Limited visualization of the left kidney. PANCREAS: The pancreas was not visualized. SPLEEN: Splenomegaly measuring 16.7 cm greatest dimension. Volume of 923 ml. VESSELS: Poor visualization of the aorta and IVC. OTHER: The exam is significantly limited by body habitus and inability to roll / position properly. IMPRESSION: 1. Limited exam. Gallbladder not visualized. 2. Increased cortical echogenicity in both kidneys, compatible with medical renal disease. 3. Splenomegaly measuring 16.7 cm (volume 923 mL). 4. Hepatic steatosis. Electronically signed by: Norman Gatlin MD 03/07/2024 07:25 PM EST RP Workstation: [id number]   CT CHEST WO CONTRAST Result Date: 03/07/2024 CLINICAL DATA:  Pneumonia complication suspected. EXAM: CT CHEST WITHOUT CONTRAST TECHNIQUE: Multidetector CT imaging of the chest was performed following the standard protocol without IV contrast. RADIATION DOSE REDUCTION: This exam was performed according to the departmental dose-optimization program which includes automated exposure control, adjustment of the mA and/or kV according to patient size and/or use of iterative reconstruction technique. COMPARISON:  Chest radiograph dated 03/06/2024. FINDINGS: Evaluation of this exam is limited in the absence of intravenous contrast. Cardiovascular: Borderline cardiomegaly. No pericardial effusion. Advanced 3 vessel coronary vascular calcification. Mild atherosclerotic calcification of the thoracic aorta. No aneurysmal dilatation. There is mild dilatation of the main pulmonary trunk suggestive of pulmonary hypertension. Mediastinum/Nodes: No hilar or  mediastinal adenopathy. The esophagus and the thyroid gland are grossly unremarkable no mediastinal fluid collection. Lungs/Pleura: Small bilateral pleural effusions with partial compressive atelectasis of the lower lobes versus pneumonia. No pneumothorax. A 1 cm nodular density in the distal trachea (57/4) likely a mucous secretion. A polyp is less likely. Attention on follow-up imaging recommended. The central airways are patent. Upper Abdomen: Indeterminate 3.5 cm right adrenal nodule, possibly an adenoma. MRI can provide better evaluation. Musculoskeletal: Osteopenia with degenerative changes spine. No acute osseous pathology. A 2.5 cm probable sebaceous cyst the subcutaneous soft tissues of the posterior thoracic wall to the right of the midline. IMPRESSION: 1. Small bilateral pleural effusions with partial compressive atelectasis of the lower lobes versus pneumonia. 2.  Aortic Atherosclerosis (ICD10-I70.0). Electronically Signed   By: Vanetta Chou M.D.   On: 03/07/2024 18:40   ECHOCARDIOGRAM LIMITED Result Date: 03/07/2024    ECHOCARDIOGRAM LIMITED REPORT   Patient Name:   ALECK LOCKLIN Date of Exam: 03/07/2024 Medical Rec #:  [id number]   Height:       69.0 in Accession #:    [id number]  Weight:       300.5 lb Date of Birth:  01-22-1946    BSA:  2.456 m Patient Age:    17 years    BP:           86/46 mmHg Patient Gender: M           HR:           66 bpm. Exam Location:  [facility] Procedure: Limited Echo, Cardiac Doppler and Intracardiac Opacification Agent            (Both Spectral and Color Flow Doppler were utilized during            procedure). Indications:    Shock R57.9  History:        Patient has prior history of Echocardiogram examinations, most                 recent 02/02/2024. CHF; Risk Factors:Sleep Apnea, Hypertension                 and Dyslipidemia. Hx of COVID-19.  Sonographer:    Aida Pizza RCS Referring Phys: [id number] TORIBIO JAYSON SHARPS IMPRESSIONS  1. Limited Echo to evaluate LV  function.  2. No LV thrombus by Definity. Left ventricular ejection fraction, by estimation, is 60 to 65%. The left ventricle has normal function. The left ventricle has no regional wall motion abnormalities. 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 Left ventricular diastolic parameters are consistent with Grade III diastolic dysfunction (restrictive). Elevated left atrial pressure.  3. Right ventricular systolic function is normal. The right ventricular size is normal. Tricuspid regurgitation signal is inadequate for assessing PA pressure.  4. Aortic dilatation noted. There is mild dilatation of the aortic root, measuring 40 mm.  5. The inferior vena cava is dilated in size with <50% respiratory variability, suggesting right atrial pressure of 15 mmHg. Comparison(s): No significant change from prior study. FINDINGS  Left Ventricle: No LV thrombus by Definity. Left ventricular ejection fraction, by estimation, is 60 to 65%. The left ventricle has normal function. The left ventricle has no regional wall motion abnormalities. Definity contrast agent was given IV to delineate the left ventricular endocardial borders. The left ventricular internal cavity size was normal in size. 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 Left ventricular diastolic parameters are consistent with Grade III diastolic dysfunction (restrictive). Elevated left atrial pressure. Right Ventricle: The right ventricular size is normal. No increase in right ventricular wall thickness. Right ventricular systolic function is normal. Tricuspid regurgitation signal is inadequate for assessing PA pressure. Left [facility]: Left atrial size was normal in size. Right [facility]: Right atrial size was normal in size. Pericardium: There is no evidence of pericardial effusion. Mitral Valve: The mitral valve is normal in structure. MV peak gradient, 4.2 mmHg. The mean mitral valve gradient is 1.0 mmHg. Tricuspid  Valve: The tricuspid valve is normal in structure. Aortic Valve: The aortic valve is tricuspid. Pulmonic Valve: The pulmonic valve was normal in structure. Aorta: Aortic dilatation noted. There is mild dilatation of the aortic root, measuring 40 mm. Venous: The inferior vena cava is dilated in size with less than 50% respiratory variability, suggesting right atrial pressure of 15 mmHg. IAS/Shunts: No atrial level shunt detected by color flow Doppler. LEFT VENTRICLE PLAX 2D LVIDd:         3.80 cm LVIDs:         2.40 cm LV PW:         1.60 cm LV IVS:        1.70 cm LVOT diam:  1.90 cm LVOT Area:     2.84 cm  RIGHT VENTRICLE TAPSE (M-mode): 1.4 cm LEFT ATRIUM         Index LA diam:    3.70 cm 1.51 cm/m   AORTA Ao Root diam: 4.00 cm MITRAL VALVE MV Area (PHT): 4.89 cm     SHUNTS MV Peak grad:  4.2 mmHg     Systemic Diam: 1.90 cm MV Mean grad:  1.0 mmHg MV Vmax:       1.03 m/s MV Vmean:      54.2 cm/s MV Decel Time: 155 msec MV E velocity: 109.00 cm/s MV A velocity: 52.40 cm/s MV E/A ratio:  2.08 Vishnu Priya Mallipeddi Electronically signed by Diannah Late Mallipeddi Signature Date/Time: 03/07/2024/4:59:24 PM    Final    DG Chest Port 1 View Result Date: 03/06/2024 EXAM: 1 VIEW(S) XRAY OF THE CHEST 03/06/2024 09:53:00 PM COMPARISON: Comparison with 02/01/2024. CLINICAL HISTORY: SOB. FINDINGS: LUNGS AND PLEURA: Low lung volumes. Pulmonary vascularity is accentuated. Patchy airspace opacities in the right mid and lower lung with airspace opacities in the left lower lung. Small right pleural effusion. No pneumothorax. HEART AND MEDIASTINUM: Stable cardiomegaly. Aortic atherosclerotic calcification. The cardiomediastinal silhouette is accentuated. BONES AND SOFT TISSUES: No acute osseous abnormality. IMPRESSION: 1. Findings concerning for multifocal pneumonia. Small pleural effusion. Electronically signed by: Norman Gatlin MD 03/06/2024 09:59 PM EST RP Workstation: [id number]    Microbiology: Results for  orders placed or performed during the hospital encounter of 03/06/24  Blood culture (routine x 2)     Status: None (Preliminary result)   Collection Time: 03/06/24  9:29 PM   Specimen: BLOOD  Result Value Ref Range Status   Specimen Description BLOOD RIGHT ANTECUBITAL  Final   Special Requests   Final    BOTTLES DRAWN AEROBIC AND ANAEROBIC Blood Culture adequate volume   Culture   Final    NO GROWTH 2 DAYS Performed at [facility], [address], [place], [place] [address]    Report Status PENDING  Incomplete  Resp panel by RT-PCR (RSV, Flu A&B, Covid) Anterior Nasal Swab     Status: None   Collection Time: 03/06/24  9:33 PM   Specimen: Anterior Nasal Swab  Result Value Ref Range Status   SARS Coronavirus 2 by RT PCR NEGATIVE NEGATIVE Final    Comment: (NOTE) SARS-CoV-2 target nucleic acids are NOT DETECTED.  The SARS-CoV-2 RNA is generally detectable in upper respiratory specimens during the acute phase of infection. The lowest concentration of SARS-CoV-2 viral copies this assay can detect is 138 copies/mL. A negative result does not preclude SARS-Cov-2 infection and should not be used as the sole basis for treatment or other patient management decisions. A negative result may occur with  improper specimen collection/handling, submission of specimen other than nasopharyngeal swab, presence of viral mutation(s) within the areas targeted by this assay, and inadequate number of viral copies(<138 copies/mL). A negative result must be combined with clinical observations, patient history, and epidemiological information. The expected result is Negative.  Fact Sheet for Patients:  bloggercourse.com  Fact Sheet for Healthcare Providers:  seriousbroker.it  This test is no t yet approved or cleared by the [place]  FDA and  has been authorized for detection and/or diagnosis of SARS-CoV-2 by FDA under an Emergency Use Authorization  (EUA). This EUA will remain  in effect (meaning this test can be used) for the duration of the COVID-19 declaration under Section 564(b)(1) of the Act, 21 U.S.C.section 360bbb-3(b)(1), unless the  authorization is terminated  or revoked sooner.       Influenza A by PCR NEGATIVE NEGATIVE Final   Influenza B by PCR NEGATIVE NEGATIVE Final    Comment: (NOTE) The Xpert Xpress SARS-CoV-2/FLU/RSV plus assay is intended as an aid in the diagnosis of influenza from Nasopharyngeal swab specimens and should not be used as a sole basis for treatment. Nasal washings and aspirates are unacceptable for Xpert Xpress SARS-CoV-2/FLU/RSV testing.  Fact Sheet for Patients: bloggercourse.com  Fact Sheet for Healthcare Providers: seriousbroker.it  This test is not yet approved or cleared by the [place]  FDA and has been authorized for detection and/or diagnosis of SARS-CoV-2 by FDA under an Emergency Use Authorization (EUA). This EUA will remain in effect (meaning this test can be used) for the duration of the COVID-19 declaration under Section 564(b)(1) of the Act, 21 U.S.C. section 360bbb-3(b)(1), unless the authorization is terminated or revoked.     Resp Syncytial Virus by PCR NEGATIVE NEGATIVE Final    Comment: (NOTE) Fact Sheet for Patients: bloggercourse.com  Fact Sheet for Healthcare Providers: seriousbroker.it  This test is not yet approved or cleared by the [place]  FDA and has been authorized for detection and/or diagnosis of SARS-CoV-2 by FDA under an Emergency Use Authorization (EUA). This EUA will remain in effect (meaning this test can be used) for the duration of the COVID-19 declaration under Section 564(b)(1) of the Act, 21 U.S.C. section 360bbb-3(b)(1), unless the authorization is terminated or revoked.  Performed at [facility], [address] [place], [place]  [address]   Blood culture (routine x 2)     Status: None (Preliminary result)   Collection Time: 03/06/24 10:55 PM   Specimen: BLOOD  Result Value Ref Range Status   Specimen Description BLOOD BLOOD RIGHT HAND  Final   Special Requests   Final    BOTTLES DRAWN AEROBIC AND ANAEROBIC Blood Culture adequate volume   Culture   Final    NO GROWTH 2 DAYS Performed at [facility], [address], [place], [place] [address]    Report Status PENDING  Incomplete  MRSA Next Gen by PCR, Nasal     Status: None   Collection Time: 03/07/24  1:43 AM   Specimen: Nasal Mucosa; Nasal Swab  Result Value Ref Range Status   MRSA by PCR Next Gen NOT DETECTED NOT DETECTED Final    Comment: (NOTE) The GeneXpert MRSA Assay (FDA approved for NASAL specimens only), is one component of a comprehensive MRSA colonization surveillance program. It is not intended to diagnose MRSA infection nor to guide or monitor treatment for MRSA infections. Test performance is not FDA approved in patients less than 30 years old. Performed at [facility], [address], [place], [place] [address]   Respiratory (~20 pathogens) panel by PCR     Status: None   Collection Time: 03/07/24 11:18 AM   Specimen: Nasopharyngeal Swab; Respiratory  Result Value Ref Range Status   Adenovirus NOT DETECTED NOT DETECTED Final   Coronavirus 229E NOT DETECTED NOT DETECTED Final    Comment: (NOTE) The Coronavirus on the Respiratory Panel, DOES NOT test for the novel  Coronavirus (2019 nCoV)    Coronavirus HKU1 NOT DETECTED NOT DETECTED Final   Coronavirus NL63 NOT DETECTED NOT DETECTED Final   Coronavirus OC43 NOT DETECTED NOT DETECTED Final   Metapneumovirus NOT DETECTED NOT DETECTED Final   Rhinovirus / Enterovirus NOT DETECTED NOT DETECTED Final   Influenza A NOT DETECTED NOT DETECTED Final  Influenza B NOT DETECTED NOT DETECTED Final   Parainfluenza Virus 1 NOT DETECTED NOT DETECTED Final   Parainfluenza Virus 2 NOT DETECTED NOT  DETECTED Final   Parainfluenza Virus 3 NOT DETECTED NOT DETECTED Final   Parainfluenza Virus 4 NOT DETECTED NOT DETECTED Final   Respiratory Syncytial Virus NOT DETECTED NOT DETECTED Final   Bordetella pertussis NOT DETECTED NOT DETECTED Final   Bordetella Parapertussis NOT DETECTED NOT DETECTED Final   Chlamydophila pneumoniae NOT DETECTED NOT DETECTED Final   Mycoplasma pneumoniae NOT DETECTED NOT DETECTED Final    Comment: Performed at [facility], [address], [place], [place] [address]    Labs: CBC: Recent Labs  Lab 03/06/24 2129 03/07/24 0442 03/08/24 0311  WBC 11.0* 10.5 12.7*  HGB 12.3* 11.7* 11.7*  HCT 40.9 39.3 38.6*  MCV 108.8* 109.5* 107.2*  PLT 138* 121* 139*   Basic Metabolic Panel: Recent Labs  Lab 03/06/24 2129 03/07/24 0442 03/08/24 0311  NA 141 141 136  K 4.1 4.1 4.8  CL 102 101 97*  CO2 31 26 29   GLUCOSE 48* 70 141*  BUN 46* 51* 64*  CREATININE 4.15* 4.58* 5.86*  CALCIUM  9.0 8.8* 8.9  MG  --  2.6*  --   PHOS  --  5.7* 7.3*   Liver Function Tests: Recent Labs  Lab 03/06/24 2129 03/07/24 0442 03/08/24 0311  AST 20 23  --   ALT 18 18  --   ALKPHOS 91 88  --   BILITOT 0.3 0.3  --   PROT 6.7 6.7  --   ALBUMIN 3.7 3.7 3.8   CBG: Recent Labs  Lab 03/07/24 1633 03/07/24 2001 03/08/24 0007 03/08/24 0340 03/08/24 0750  GLUCAP 118* 138* 143* 152* 202*    Discharge time spent: greater than 30 minutes.  Signed: Alm Schneider, MD [facility] 03/08/2024

## 2024-03-08 NOTE — Progress Notes (Signed)
 1200 finger stick CBG did not cross over into the chart. Glucose 170

## 2024-03-08 NOTE — Care Management Important Message (Signed)
 Important Message  Patient Details  Name: Dave Brown MRN: [id number] Date of Birth: January 18, 1946   Important Message Given:  Yes - Medicare IM     Alhassan Everingham L Rache Klimaszewski 03/08/2024, 11:33 AM

## 2024-03-08 NOTE — Progress Notes (Addendum)
 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   Subjective:  Progressive hypotension over the past 24 hours now requiring low-dose norepinephrine Remains on 4 to 6 L of oxygen  Anuric Abdominal ultrasound yesterday with no hydronephrosis noted. Bladder scans around 100 mL AM RFP reviewed: Renal function continues to worsen: Creatinine 5.6, BUN 64, potassium 4.8. He received 40 mg IV Lasix  yesterday, but not a larger bolus of Lasix  yesterday as we are awaiting a renal ultrasound. On midodrine and hydrocortisone, PCCM following.  11/13 0701 - 11/14 0700 In: 962.8 [P.O.:720; I.V.:242.8] Out: 50 [Urine:50]   Filed Weights   03/06/24 2127 03/07/24 0209 03/08/24 0445  Weight: 133 kg (!) 136.3 kg (!) 139.8 kg    Scheduled Meds:  Chlorhexidine Gluconate Cloth  6 each Topical Daily   heparin injection (subcutaneous)  5,000 Units Subcutaneous Q8H   hydrocortisone sod succinate (SOLU-CORTEF) inj  100 mg Intravenous Q8H   midodrine  10 mg Oral TID WC   rosuvastatin   10 mg Oral Daily   sertraline   25 mg Oral Daily   sodium chloride  flush  10-40 mL Intracatheter Q12H   Continuous Infusions:  sodium chloride      dextrose  30 mL/hr at 03/07/24 0020   norepinephrine (LEVOPHED) Adult infusion 2 mcg/min (03/08/24 1016)   PRN Meds:.acetaminophen  **OR** acetaminophen , guaiFENesin -dextromethorphan , ondansetron  **OR** ondansetron  (ZOFRAN ) IV, sodium chloride  flush  Current Labs: reviewed    Physical Exam:  Blood pressure (!) 93/49, pulse (!) 59, temperature (!) 97.4 F (36.3 C), temperature source Axillary, resp. rate (!) 23, height 5' 9 (1.753 m), weight (!) 139.8 kg, SpO2 97%. GEN: Elderly male, obese, chronically ill-appearing ENT: Nasal cannula in place EYES: Glasses on CV: Regular no murmur PULM: Diminished in the bases bilaterally, normal work of breathing ABD: Protuberant/obese, limited by  adiposity.  Soft. SKIN: No rashes noted EXT: 2+ lower extreme edema and symmetrically noted.  A AoC CKD4, worsening, needs dialysis; high risk of complication Follows with [facility] in [place] Most likely this is cardiorenal syndrome with an acute exacerbation of HFpEF Obstruction excluded with abdominal ultrasound 11/13  Continue to hold ACEi With progressive AKI and ongoing hypervolemia with problem #2 needs to start dialysis, too unstable for dialysis at [facility] given shock on pressors, needs to transfer to [facility] to initiate CRRT. Will give 160 mg of IV Lasix  now pending transfer. Acute on chronic HFpEF with acute on chronic hypoxic respiratory failure and volume overload: Worsening. Volume management as above.   Progressive shock, CCM following, on norepinephrine along with hydrocortisone and midodrine. This complicates his need for dialysis.  Hyperphosphatemia: Will trend with dialysis, do not use binder Anemia: Mild, no ESA indicated.  P As above, transfer to [facility], bolus Lasix  for now Dr. Dolan and Geralynn are at [facility] and will be updated about patient's transfer; will review chart and reassess before writing dialysis orders upon arrival Plan discussed with Dr. Evonnie and primary RN Medication Issues; Preferred narcotic agents for pain control are hydromorphone, fentanyl , and methadone. Morphine should not be used.  Baclofen should be avoided Avoid oral sodium phosphate and magnesium citrate based laxatives / bowel preps    Bernardino Gasman MD 03/08/2024, 11:01 AM  Recent Labs  Lab 03/06/24 2129 03/07/24 0442 03/08/24 0311  NA 141 141 136  K 4.1 4.1 4.8  CL 102 101 97*  CO2 31 26 29   GLUCOSE 48* 70 141*  BUN 46* 51* 64*  CREATININE 4.15* 4.58* 5.86*  CALCIUM  9.0 8.8* 8.9  PHOS  --  5.7* 7.3*   Recent Labs  Lab 03/06/24 2129 03/07/24 0442 03/08/24 0311  WBC 11.0* 10.5 12.7*  HGB 12.3* 11.7* 11.7*  HCT 40.9 39.3 38.6*  MCV 108.8* 109.5*  107.2*  PLT 138* 121* 139*

## 2024-03-08 NOTE — Plan of Care (Signed)

## 2024-03-08 NOTE — Progress Notes (Signed)
 1600 CBG 130. Glucometer not crossing over.

## 2024-03-09 ENCOUNTER — Inpatient Hospital Stay

## 2024-03-09 LAB — BLOOD GAS, ARTERIAL
Acid-base deficit: 2.2 mmol/L — ABNORMAL HIGH (ref 0.0–2.0)
Acid-base deficit: 1.4 mmol/L (ref 0.0–2.0)
Acid-base deficit: 7 mmol/L — ABNORMAL HIGH (ref 0.0–2.0)
Bicarbonate: 27 mmol/L (ref 20.0–28.0)
Bicarbonate: 26 mmol/L (ref 20.0–28.0)
Bicarbonate: 20.2 mmol/L (ref 20.0–28.0)
Delivery systems: POSITIVE
Expiratory PAP: 5 cmH2O
FIO2: 40 %
FIO2: 60 %
Inspiratory PAP: 20 cmH2O
MECHVT: 480 mL
Mechanical Rate: 22
O2 Content: 4 L/min
O2 Saturation: 98.3 %
O2 Saturation: 99.2 %
O2 Saturation: 99.2 %
PEEP: 8 cmH2O
Patient temperature: 37
Patient temperature: 37
Patient temperature: 37
pCO2 arterial: 66 mmHg (ref 32–48)
pCO2 arterial: 54 mmHg — ABNORMAL HIGH (ref 32–48)
pCO2 arterial: 46 mmHg (ref 32–48)
pH, Arterial: 7.22 — ABNORMAL LOW (ref 7.35–7.45)
pH, Arterial: 7.29 — ABNORMAL LOW (ref 7.35–7.45)
pH, Arterial: 7.25 — ABNORMAL LOW (ref 7.35–7.45)
pO2, Arterial: 76 mmHg — ABNORMAL LOW (ref 83–108)
pO2, Arterial: 90 mmHg (ref 83–108)
pO2, Arterial: 102 mmHg (ref 83–108)

## 2024-03-09 LAB — RENAL FUNCTION PANEL
Albumin: 3.6 g/dL (ref 3.5–5.0)
Albumin: 3.4 g/dL — ABNORMAL LOW (ref 3.5–5.0)
Anion gap: 10 (ref 5–15)
Anion gap: 9 (ref 5–15)
BUN: 60 mg/dL — ABNORMAL HIGH (ref 8–23)
BUN: 46 mg/dL — ABNORMAL HIGH (ref 8–23)
CO2: 26 mmol/L (ref 22–32)
CO2: 21 mmol/L — ABNORMAL LOW (ref 22–32)
Calcium: 8.1 mg/dL — ABNORMAL LOW (ref 8.9–10.3)
Calcium: 8 mg/dL — ABNORMAL LOW (ref 8.9–10.3)
Chloride: 94 mmol/L — ABNORMAL LOW (ref 98–111)
Chloride: 101 mmol/L (ref 98–111)
Creatinine, Ser: 5.53 mg/dL — ABNORMAL HIGH (ref 0.61–1.24)
Creatinine, Ser: 3.92 mg/dL — ABNORMAL HIGH (ref 0.61–1.24)
GFR, Estimated: 10 mL/min — ABNORMAL LOW (ref >60)
GFR, Estimated: 15 mL/min — ABNORMAL LOW (ref >60)
Glucose, Bld: 226 mg/dL — ABNORMAL HIGH (ref 70–99)
Glucose, Bld: 131 mg/dL — ABNORMAL HIGH (ref 70–99)
Phosphorus: 6.6 mg/dL — ABNORMAL HIGH (ref 2.5–4.6)
Phosphorus: 3.5 mg/dL (ref 2.5–4.6)
Potassium: 4.8 mmol/L (ref 3.5–5.1)
Potassium: 4.8 mmol/L (ref 3.5–5.1)
Sodium: 129 mmol/L — ABNORMAL LOW (ref 135–145)
Sodium: 131 mmol/L — ABNORMAL LOW (ref 135–145)

## 2024-03-09 LAB — BLOOD GAS, VENOUS
Acid-base deficit: 2.4 mmol/L — ABNORMAL HIGH (ref 0.0–2.0)
Bicarbonate: 25.9 mmol/L (ref 20.0–28.0)
O2 Saturation: 54 %
Patient temperature: 37
pCO2, Ven: 59 mmHg (ref 44–60)
pH, Ven: 7.25 (ref 7.25–7.43)
pO2, Ven: 33 mmHg (ref 32–45)

## 2024-03-09 LAB — CBC
HCT: 38.6 % — ABNORMAL LOW (ref 39.0–52.0)
Hemoglobin: 11.6 g/dL — ABNORMAL LOW (ref 13.0–17.0)
MCH: 32.1 pg (ref 26.0–34.0)
MCHC: 30.1 g/dL (ref 30.0–36.0)
MCV: 106.9 fL — ABNORMAL HIGH (ref 80.0–100.0)
Platelets: 153 K/uL (ref 150–400)
RBC: 3.61 MIL/uL — ABNORMAL LOW (ref 4.22–5.81)
RDW: 13.9 % (ref 11.5–15.5)
WBC: 16.2 K/uL — ABNORMAL HIGH (ref 4.0–10.5)
nRBC: 0.1 % (ref 0.0–0.2)

## 2024-03-09 LAB — GLUCOSE, CAPILLARY
Glucose-Capillary: 107 mg/dL — ABNORMAL HIGH (ref 70–99)
Glucose-Capillary: 111 mg/dL — ABNORMAL HIGH (ref 70–99)
Glucose-Capillary: 115 mg/dL — ABNORMAL HIGH (ref 70–99)
Glucose-Capillary: 127 mg/dL — ABNORMAL HIGH (ref 70–99)
Glucose-Capillary: 129 mg/dL — ABNORMAL HIGH (ref 70–99)
Glucose-Capillary: 88 mg/dL (ref 70–99)

## 2024-03-09 LAB — MAGNESIUM: Magnesium: 2.5 mg/dL — ABNORMAL HIGH (ref 1.7–2.4)

## 2024-03-09 LAB — HEPARIN LEVEL (UNFRACTIONATED): Heparin Unfractionated: 0.32 [IU]/mL (ref 0.30–0.70)

## 2024-03-09 LAB — LACTIC ACID, PLASMA: Lactic Acid, Venous: 1.2 mmol/L (ref 0.5–1.9)

## 2024-03-09 LAB — PROCALCITONIN: Procalcitonin: 0.47 ng/mL

## 2024-03-09 LAB — PHOSPHORUS: Phosphorus: 6.5 mg/dL — ABNORMAL HIGH (ref 2.5–4.6)

## 2024-03-09 MED ORDER — ETOMIDATE 2 MG/ML IV SOLN
INTRAVENOUS | Status: AC
  Administered 2024-03-09: 20 mg via INTRAVENOUS
  Filled 2024-03-09: qty 10

## 2024-03-09 MED ORDER — HEPARIN SOD (PORK) LOCK FLUSH 100 UNIT/ML IV SOLN
500.0000 [IU] | Freq: Once | INTRAVENOUS | Status: AC | PRN
  Administered 2024-03-09: 500 [IU] via INTRAVENOUS
  Filled 2024-03-09: qty 5

## 2024-03-09 MED ORDER — VASOPRESSIN 20 UNITS/100 ML INFUSION FOR SHOCK
0.0000 [IU]/min | INTRAVENOUS | Status: DC
  Administered 2024-03-09: 0.03 [IU]/min via INTRAVENOUS
  Administered 2024-03-10: 0.02 [IU]/min via INTRAVENOUS
  Administered 2024-03-10: 0.04 [IU]/min via INTRAVENOUS
  Administered 2024-03-12 – 2024-03-13 (×3): 0.03 [IU]/min via INTRAVENOUS
  Filled 2024-03-09 (×7): qty 100

## 2024-03-09 MED ORDER — LORAZEPAM 2 MG/ML IJ SOLN
INTRAMUSCULAR | Status: AC
  Filled 2024-03-09: qty 1

## 2024-03-09 MED ORDER — HEPARIN (PORCINE) 25000 UT/250ML-% IV SOLN
1550.0000 [IU]/h | INTRAVENOUS | Status: DC
  Administered 2024-03-09 – 2024-03-10 (×2): 1100 [IU]/h via INTRAVENOUS
  Administered 2024-03-11: 1300 [IU]/h via INTRAVENOUS
  Filled 2024-03-09 (×3): qty 250

## 2024-03-09 MED ORDER — ALTEPLASE 2 MG IJ SOLR
2.0000 mg | Freq: Once | INTRAMUSCULAR | Status: AC
  Administered 2024-03-09: 2 mg
  Filled 2024-03-09: qty 2

## 2024-03-09 MED ORDER — ROCURONIUM BROMIDE 10 MG/ML (PF) SYRINGE
PREFILLED_SYRINGE | INTRAVENOUS | Status: AC
  Administered 2024-03-09: 100 mg via INTRAVENOUS
  Filled 2024-03-09: qty 10

## 2024-03-09 MED ORDER — STERILE WATER FOR INJECTION IJ SOLN
INTRAMUSCULAR | Status: AC
  Administered 2024-03-09: 10 mL
  Filled 2024-03-09: qty 10

## 2024-03-09 MED ORDER — ORAL CARE MOUTH RINSE
15.0000 mL | OROMUCOSAL | Status: DC
  Administered 2024-03-09 (×3): 15 mL via OROMUCOSAL

## 2024-03-09 MED ORDER — RENA-VITE PO TABS
1.0000 | ORAL_TABLET | Freq: Every day | ORAL | Status: DC
  Administered 2024-03-09 – 2024-03-14 (×6): 1
  Filled 2024-03-09 (×5): qty 1

## 2024-03-09 MED ORDER — SODIUM CHLORIDE 0.9 % IV BOLUS
1000.0000 mL | Freq: Once | INTRAVENOUS | Status: AC
  Administered 2024-03-09: 1000 mL via INTRAVENOUS

## 2024-03-09 MED ORDER — RENA-VITE PO TABS
1.0000 | ORAL_TABLET | Freq: Every day | ORAL | Status: DC
  Filled 2024-03-09: qty 1

## 2024-03-09 MED ORDER — ETOMIDATE 2 MG/ML IV SOLN: 20.0000 mg | Freq: Once | INTRAVENOUS | Status: AC

## 2024-03-09 MED ORDER — ORAL CARE MOUTH RINSE: 15.0000 mL | OROMUCOSAL | Status: DC | PRN

## 2024-03-09 MED ORDER — POLYETHYLENE GLYCOL 3350 17 G PO PACK: 17.0000 g | PACK | Freq: Every day | ORAL | Status: DC | PRN

## 2024-03-09 MED ORDER — HALOPERIDOL LACTATE 5 MG/ML IJ SOLN
5.0000 mg | Freq: Once | INTRAMUSCULAR | Status: AC
  Administered 2024-03-09: 5 mg via INTRAVENOUS
  Filled 2024-03-09: qty 1

## 2024-03-09 MED ORDER — PANTOPRAZOLE SODIUM 40 MG IV SOLR
40.0000 mg | Freq: Every day | INTRAVENOUS | Status: DC
  Administered 2024-03-09 – 2024-03-26 (×18): 40 mg via INTRAVENOUS
  Filled 2024-03-09 (×18): qty 10

## 2024-03-09 MED ORDER — PROPOFOL 1000 MG/100ML IV EMUL
INTRAVENOUS | Status: AC
  Administered 2024-03-09: 30 ug via INTRAVENOUS
  Filled 2024-03-09: qty 100

## 2024-03-09 MED ORDER — STERILE WATER FOR INJECTION IJ SOLN
INTRAMUSCULAR | Status: AC
  Administered 2024-03-09: 2.2 mL
  Filled 2024-03-09: qty 10

## 2024-03-09 MED ORDER — ROCURONIUM BROMIDE 10 MG/ML (PF) SYRINGE: 100.0000 mg | PREFILLED_SYRINGE | Freq: Once | INTRAVENOUS | Status: AC

## 2024-03-09 MED ORDER — PROPOFOL 1000 MG/100ML IV EMUL
0.0000 ug/kg/min | INTRAVENOUS | Status: DC
Start: 2024-03-09 — End: 2024-03-15
  Administered 2024-03-09: 30 ug/kg/min via INTRAVENOUS
  Administered 2024-03-10 (×3): 50 ug/kg/min via INTRAVENOUS
  Administered 2024-03-10 (×2): 35 ug/kg/min via INTRAVENOUS
  Administered 2024-03-10: 40 ug/kg/min via INTRAVENOUS
  Administered 2024-03-10 (×2): 50 ug/kg/min via INTRAVENOUS
  Administered 2024-03-10: 45 ug/kg/min via INTRAVENOUS
  Administered 2024-03-11 (×3): 50 ug/kg/min via INTRAVENOUS
  Administered 2024-03-11: 60 ug/kg/min via INTRAVENOUS
  Administered 2024-03-11: 50 ug/kg/min via INTRAVENOUS
  Administered 2024-03-11: 55 ug/kg/min via INTRAVENOUS
  Administered 2024-03-11 – 2024-03-12 (×6): 50 ug/kg/min via INTRAVENOUS
  Administered 2024-03-12 (×5): 30 ug/kg/min via INTRAVENOUS
  Administered 2024-03-13: 20 ug/kg/min via INTRAVENOUS
  Administered 2024-03-13: 25 ug/kg/min via INTRAVENOUS
  Administered 2024-03-13: 30 ug/kg/min via INTRAVENOUS
  Administered 2024-03-13: 25 ug/kg/min via INTRAVENOUS
  Administered 2024-03-14 (×2): 30 ug/kg/min via INTRAVENOUS
  Administered 2024-03-14 (×2): 25 ug/kg/min via INTRAVENOUS
  Administered 2024-03-15: 30 ug/kg/min via INTRAVENOUS
  Administered 2024-03-15: 20 ug/kg/min via INTRAVENOUS
  Filled 2024-03-09 (×38): qty 100

## 2024-03-09 MED ORDER — ORAL CARE MOUTH RINSE
15.0000 mL | OROMUCOSAL | Status: DC
  Administered 2024-03-09 – 2024-03-15 (×68): 15 mL via OROMUCOSAL

## 2024-03-09 NOTE — Progress Notes (Signed)
 Upon leaving Pt room, patient's daughter yelling out to her father to leave Bipap mask alone. Pt daughter then yells out to RN for help, Returning to room patient is observed Shaking in the bed Violently. Upon closer assessment of Patient, he is observed to be cyanotic and minimally responsive to sternal rub and verbal stimuli. Initiated Bagging on 100% FiO2, MD and Other RNs alerted for emergency response. Under MD rapid assessment, Patient to be intubated (see MD notes). Patient family escorted to waiting room.

## 2024-03-09 NOTE — Consult Note (Signed)
 Cardiology Consultation   Patient ID: [name] MRN: [id number]; DOB: [birth year]  Admit date: 03/08/2024 Date of Consult: 03/09/2024  PCP:  Debrah Josette ORN., PA-C   [facility] Providers Cardiologist:  Newman JINNY Lawrence, MD        Patient Profile: Dave Brown is a 78 y.o. male with a hx of hypertension, hyperlipidemia, obesity, chronic hypoxia on 2 L O2, CKD 4, OSA not on CPAP, MGUS, and HFpEF who is being seen 03/09/2024 for the evaluation of acute on chronic heart failure and hypoxic respiratory failure in the setting of worsening acute on chronic renal failure at the request of Dr. Isadora.  History of Present Illness: Dave Brown is a patient of Dr.[name].  He was last seen 06/2022 and was euvolemic on exam.  Weights were improving at home and he was on stable therapy.  He was not a candidate for SGLT2 or MRA in the setting of his renal dysfunction.  It was thought low likelihood that he had cardiac amyloidosis.  Echo 02/2022 revealed LVEF 55-60% with moderate concentric LVH and grade 1 diastolic dysfunction.  He presents now as a transfer from independent hospital with acute on chronic hypoxic respiratory failure in the setting of shock and acute on chronic renal failure.  He was transferred here due to the need for CRRT for volume removal.  He was admitted 02/01/2024 with respiratory failure reportedly due to heart failure.  Weight went up by 29 pounds in 10 days.  He has had multiple heart failure hospitalizations there he became hypotensive and required Levophed.  He became oliguric and nephrology felt that he needed to be started on CRRT.  Blood cultures have been negative.  COVID/flu/RSV panels were all negative.  Echo 02/02/2024 revealed LVEF 65 to 65% with normal RV function and grade 3 diastolic dysfunction.  proBNP 16,139.  He had bilateral pleural effusions.  There was some question of pneumonia, though this was thought to be low likelihood.  He arrived intubated.   Son and daughter are at bedside and corroborated the above history.   Past Medical History:  Diagnosis Date   CHF (congestive heart failure) (HCC)    Degenerative joint disease    High cholesterol    Hypertension    Kidney stones    Obesity    Respiratory failure with hypoxia (HCC)    Sleep apnea     No past surgical history on file.   Home Medications:  Prior to Admission medications   Medication Sig Start Date End Date Taking? Authorizing Provider  albuterol  (PROVENTIL ) (2.5 MG/3ML) 0.083% nebulizer solution Take 2.5 mg by nebulization every 6 (six) hours as needed for shortness of breath or wheezing. 03/04/24   [provider]  hydrocortisone sodium succinate (SOLU-CORTEF) 100 MG injection Inject 2 mLs (100 mg total) into the vein every 8 (eight) hours. 03/08/24   Evonnie Lenis, MD  midodrine (PROAMATINE) 10 MG tablet Take 1 tablet (10 mg total) by mouth 3 (three) times daily with meals. 03/08/24   Evonnie Lenis, MD  norepinephrine (LEVOPHED) 4-5 MG/250ML-% SOLN Inject 0-10 mcg/min into the vein continuous. 03/08/24   Evonnie Lenis, MD  rosuvastatin  (CRESTOR ) 10 MG tablet Take 10 mg by mouth daily.    [provider]  sertraline  (ZOLOFT ) 25 MG tablet Take 25 mg by mouth in the morning.    [provider]  TYLENOL  325 MG tablet Take 325-650 mg by mouth every 8 (eight) hours as needed for mild pain (pain score 1-3) (or  headaches).    [provider]    Scheduled Meds:  Chlorhexidine Gluconate Cloth  6 each Topical Daily   insulin  aspart  0-6 Units Subcutaneous Q4H   ipratropium-albuterol   3 mL Nebulization Q6H   multivitamin  1 tablet Oral QHS   mouth rinse  15 mL Mouth Rinse 4 times per day   Continuous Infusions:  dexmedetomidine (PRECEDEX) IV infusion 1 mcg/kg/hr (03/09/24 1339)   heparin 1,100 Units/hr (03/09/24 1346)   norepinephrine (LEVOPHED) Adult infusion 10 mcg/min (03/09/24 1300)   prismasol BGK 4/2.5 400 mL/hr at 03/08/24 2207    prismasol BGK 4/2.5 400 mL/hr at 03/08/24 2207   prismasol BGK 4/2.5 2,500 mL/hr at 03/09/24 0647   PRN Meds: docusate sodium, heparin, ipratropium-albuterol , mouth rinse, polyethylene glycol  Allergies:   No Known Allergies  Social History:   Social History   Socioeconomic History   Marital status: Widowed    Spouse name: Not on file   Number of children: 4   Years of education: Not on file   Highest education level: Not on file  Occupational History   Not on file  Tobacco Use   Smoking status: Former    Types: Cigars    Quit date: 05/07/1998    Years since quitting: 25.8   Smokeless tobacco: Not on file  Vaping Use   Vaping status: Never Used  Substance and Sexual Activity   Alcohol use: No   Drug use: No   Sexual activity: Not on file    Comment: pt. stated,  I smoked 5 cigars a day.  Other Topics Concern   Not on file  Social History Narrative   Not on file   Social Drivers of Health   Financial Resource Strain: Not on file  Food Insecurity: No Food Insecurity (03/07/2024)   Hunger Vital Sign    Worried About Running Out of Food in the Last Year: Never true    Ran Out of Food in the Last Year: Never true  Transportation Needs: No Transportation Needs (03/07/2024)   PRAPARE - Administrator, Civil Service (Medical): No    Lack of Transportation (Non-Medical): No  Physical Activity: Not on file  Stress: Not on file  Social Connections: Unknown (03/07/2024)   Social Connection and Isolation Panel    Frequency of Communication with Friends and Family: Three times a week    Frequency of Social Gatherings with Friends and Family: Three times a week    Attends Religious Services: Patient declined    Active Member of Clubs or Organizations: Patient declined    Attends Banker Meetings: Patient declined    Marital Status: Widowed  Intimate Partner Violence: Not At Risk (03/07/2024)   Humiliation, Afraid, Rape, and Kick questionnaire     Fear of Current or Ex-Partner: No    Emotionally Abused: No    Physically Abused: No    Sexually Abused: No    Family History:   No family history on file.Deferred.  Intubated and sedated.   ROS:  Unable to obtain.  Intubated and sedated.  Physical Exam/Data: Vitals:   03/09/24 1215 03/09/24 1230 03/09/24 1245 03/09/24 1300  BP: 94/78  (!) 83/59 (!) 79/56  Pulse: 60 (!) 57 (!) 53 (!) 56  Resp: (!) 25 (!) 21 20 19   Temp:      TempSrc:      SpO2: 92% 94% 92% 99%  Weight:      Height:  Intake/Output Summary (Last 24 hours) at 03/09/2024 1352 Last data filed at 03/09/2024 1300 Gross per 24 hour  Intake 936.51 ml  Output 758 ml  Net 178.51 ml      03/09/2024    3:15 AM 03/08/2024    6:42 PM 03/08/2024    4:45 AM  Last 3 Weights  Weight (lbs) 290 lb 9.1 oz 317 lb 10.9 oz 308 lb 3.3 oz  Weight (kg) 131.8 kg 144.1 kg 139.8 kg     VS:  BP (!) 79/56   Pulse (!) 56   Temp 98.2 F (36.8 C)   Resp 19   Ht 5' 9 (1.753 m)   Wt 131.8 kg   SpO2 99%   BMI 42.91 kg/m  , BMI Body mass index is 42.91 kg/m. GENERAL:  Critically ill-appearing.  On BiPAP.   HEENT: [place]/AT NECK:  Very large neck. Unable to assess JVD.  LUNGS: Unremarkable on anterior exam HEART:  RRR.  PMI not displaced or sustained,S1 and S2 within normal limits, no S3, no S4, no clicks, no rubs, no murmurs ABD:  Flat, positive bowel sounds normal in frequency in pitch, no bruits, no rebound, no guarding, no midline pulsatile mass, no hepatomegaly, no splenomegaly EXT:  2 plus pulses throughout, no edema, no cyanosis no clubbing SKIN:  No rashes no nodules NEURO:  Sedated. Unable to assess.  PSYCH:  Sedated. Unable to assess.   EKG:  The EKG was personally reviewed and demonstrates:  Sinus rhythm.  Rate 61 bpm.   Telemetry:  Telemetry was personally reviewed and demonstrates:  Sinus rhythm.  PVCs  Relevant CV Studies: Echo 03/07/24: 1. Limited Echo to evaluate LV function.   2. No LV thrombus by  Definity. Left ventricular ejection fraction, by  estimation, is 60 to 65%. The left ventricle has normal function. The left  ventricle has no regional wall motion abnormalities. There is severe left  ventricular hypertrophy of the  septal segment. Left ventricular diastolic parameters are consistent with  Grade III diastolic dysfunction (restrictive). Elevated left atrial  pressure.   3. Right ventricular systolic function is normal. The right ventricular  size is normal. Tricuspid regurgitation signal is inadequate for assessing  PA pressure.   4. Aortic dilatation noted. There is mild dilatation of the aortic root,  measuring 40 mm.   5. The inferior vena cava is dilated in size with <50% respiratory  variability, suggesting right atrial pressure of 15 mmHg.   Laboratory Data: High Sensitivity Troponin:  No results for input(s): TROPONINIHS in the last 720 hours.   Chemistry Recent Labs  Lab 03/07/24 0442 03/08/24 0311 03/08/24 1932 03/09/24 0435  NA 141 136 133* 129*  K 4.1 4.8 5.4* 4.8  CL 101 97* 94* 94*  CO2 26 29 27 26   GLUCOSE 70 141* 142* 226*  BUN 51* 64* 74* 60*  CREATININE 4.58* 5.86* 6.62* 5.53*  CALCIUM  8.8* 8.9 9.1 8.1*  MG 2.6*  --  2.7* 2.5*  GFRNONAA 12* 9* 8* 10*  ANIONGAP 13 11 12 10     Recent Labs  Lab 03/06/24 2129 03/07/24 0442 03/08/24 0311 03/08/24 1932 03/09/24 0435  PROT 6.7 6.7  --  7.0  --   ALBUMIN 3.7 3.7 3.8 3.9 3.6  AST 20 23  --  23  --   ALT 18 18  --  20  --   ALKPHOS 91 88  --  90  --   BILITOT 0.3 0.3  --  0.4  --  Lipids No results for input(s): CHOL, TRIG, HDL, LABVLDL, LDLCALC, CHOLHDL in the last 168 hours.  Hematology Recent Labs  Lab 03/08/24 0311 03/08/24 1932 03/09/24 0435  WBC 12.7* 17.1* 16.2*  RBC 3.60* 3.70* 3.61*  HGB 11.7* 12.0* 11.6*  HCT 38.6* 39.8 38.6*  MCV 107.2* 107.6* 106.9*  MCH 32.5 32.4 32.1  MCHC 30.3 30.2 30.1  RDW 14.0 14.1 13.9  PLT 139* 179 153   Thyroid  Recent Labs   Lab 03/08/24 0311  TSH 0.668  FREET4 0.71    BNP Recent Labs  Lab 03/06/24 2129  PROBNP 16,139.0*    DDimer No results for input(s): DDIMER in the last 168 hours.  Radiology/Studies:  US  Abdomen Complete Result Date: 03/07/2024 EXAM: COMPLETE ABDOMINAL ULTRASOUND TECHNIQUE: Real-time ultrasonography of the abdomen was performed. COMPARISON: None available. CLINICAL HISTORY: Acute kidney injury, thrombocytopenia. FINDINGS: LIVER: Hepatic steatosis. No intrahepatic biliary ductal dilatation. Patent portal vein with antegrade flow. BILIARY SYSTEM: The gallbladder was unable to be visualized due to body habitus and difficulty with positioning. The common bile duct measures 4 mm. No cholelithiasis. KIDNEYS: Right kidney measures 8.2 cm in length. Left kidney measures 9.9 cm in length. There is increased cortical echogenicity in both kidneys. A 2.8 cm cyst is present in the right kidney, which does not require follow up. Limited visualization of the left kidney. PANCREAS: The pancreas was not visualized. SPLEEN: Splenomegaly measuring 16.7 cm greatest dimension. Volume of 923 ml. VESSELS: Poor visualization of the aorta and IVC. OTHER: The exam is significantly limited by body habitus and inability to roll / position properly. IMPRESSION: 1. Limited exam. Gallbladder not visualized. 2. Increased cortical echogenicity in both kidneys, compatible with medical renal disease. 3. Splenomegaly measuring 16.7 cm (volume 923 mL). 4. Hepatic steatosis. Electronically signed by: Norman Gatlin MD 03/07/2024 07:25 PM EST RP Workstation: [id number]   CT CHEST WO CONTRAST Result Date: 03/07/2024 CLINICAL DATA:  Pneumonia complication suspected. EXAM: CT CHEST WITHOUT CONTRAST TECHNIQUE: Multidetector CT imaging of the chest was performed following the standard protocol without IV contrast. RADIATION DOSE REDUCTION: This exam was performed according to the departmental dose-optimization program which includes  automated exposure control, adjustment of the mA and/or kV according to patient size and/or use of iterative reconstruction technique. COMPARISON:  Chest radiograph dated 03/06/2024. FINDINGS: Evaluation of this exam is limited in the absence of intravenous contrast. Cardiovascular: Borderline cardiomegaly. No pericardial effusion. Advanced 3 vessel coronary vascular calcification. Mild atherosclerotic calcification of the thoracic aorta. No aneurysmal dilatation. There is mild dilatation of the main pulmonary trunk suggestive of pulmonary hypertension. Mediastinum/Nodes: No hilar or mediastinal adenopathy. The esophagus and the thyroid gland are grossly unremarkable no mediastinal fluid collection. Lungs/Pleura: Small bilateral pleural effusions with partial compressive atelectasis of the lower lobes versus pneumonia. No pneumothorax. A 1 cm nodular density in the distal trachea (57/4) likely a mucous secretion. A polyp is less likely. Attention on follow-up imaging recommended. The central airways are patent. Upper Abdomen: Indeterminate 3.5 cm right adrenal nodule, possibly an adenoma. MRI can provide better evaluation. Musculoskeletal: Osteopenia with degenerative changes spine. No acute osseous pathology. A 2.5 cm probable sebaceous cyst the subcutaneous soft tissues of the posterior thoracic wall to the right of the midline. IMPRESSION: 1. Small bilateral pleural effusions with partial compressive atelectasis of the lower lobes versus pneumonia. 2.  Aortic Atherosclerosis (ICD10-I70.0). Electronically Signed   By: Vanetta Chou M.D.   On: 03/07/2024 18:40   ECHOCARDIOGRAM LIMITED Result Date: 03/07/2024    ECHOCARDIOGRAM  LIMITED REPORT   Patient Name:   VANESSA ALESI Date of Exam: 03/07/2024 Medical Rec #:  [id number]   Height:       69.0 in Accession #:    [id number]  Weight:       300.5 lb Date of Birth:  02/13/1946    BSA:          2.456 m Patient Age:    78 years    BP:           86/46 mmHg Patient  Gender: M           HR:           66 bpm. Exam Location:  [facility] Procedure: Limited Echo, Cardiac Doppler and Intracardiac Opacification Agent            (Both Spectral and Color Flow Doppler were utilized during            procedure). Indications:    Shock R57.9  History:        Patient has prior history of Echocardiogram examinations, most                 recent 02/02/2024. CHF; Risk Factors:Sleep Apnea, Hypertension                 and Dyslipidemia. Hx of COVID-19.  Sonographer:    Aida Pizza RCS Referring Phys: [id number] TORIBIO JAYSON SHARPS IMPRESSIONS  1. Limited Echo to evaluate LV function.  2. No LV thrombus by Definity. Left ventricular ejection fraction, by estimation, is 60 to 65%. The left ventricle has normal function. The left ventricle has no regional wall motion abnormalities. 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 Left ventricular diastolic parameters are consistent with Grade III diastolic dysfunction (restrictive). Elevated left atrial pressure.  3. Right ventricular systolic function is normal. The right ventricular size is normal. Tricuspid regurgitation signal is inadequate for assessing PA pressure.  4. Aortic dilatation noted. There is mild dilatation of the aortic root, measuring 40 mm.  5. The inferior vena cava is dilated in size with <50% respiratory variability, suggesting right atrial pressure of 15 mmHg. Comparison(s): No significant change from prior study. FINDINGS  Left Ventricle: No LV thrombus by Definity. Left ventricular ejection fraction, by estimation, is 60 to 65%. The left ventricle has normal function. The left ventricle has no regional wall motion abnormalities. Definity contrast agent was given IV to delineate the left ventricular endocardial borders. The left ventricular internal cavity size was normal in size. 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 Left ventricular diastolic parameters are consistent with Grade  III diastolic dysfunction (restrictive). Elevated left atrial pressure. Right Ventricle: The right ventricular size is normal. No increase in right ventricular wall thickness. Right ventricular systolic function is normal. Tricuspid regurgitation signal is inadequate for assessing PA pressure. Left Atrium: Left atrial size was normal in size. Right Atrium: Right atrial size was normal in size. Pericardium: There is no evidence of pericardial effusion. Mitral Valve: The mitral valve is normal in structure. MV peak gradient, 4.2 mmHg. The mean mitral valve gradient is 1.0 mmHg. Tricuspid Valve: The tricuspid valve is normal in structure. Aortic Valve: The aortic valve is tricuspid. Pulmonic Valve: The pulmonic valve was normal in structure. Aorta: Aortic dilatation noted. There is mild dilatation of the aortic root, measuring 40 mm. Venous: The inferior vena cava is dilated in size with less than 50% respiratory variability, suggesting right atrial pressure of 15  mmHg. IAS/Shunts: No atrial level shunt detected by color flow Doppler. LEFT VENTRICLE PLAX 2D LVIDd:         3.80 cm LVIDs:         2.40 cm LV PW:         1.60 cm LV IVS:        1.70 cm LVOT diam:     1.90 cm LVOT Area:     2.84 cm  RIGHT VENTRICLE TAPSE (M-mode): 1.4 cm LEFT ATRIUM         Index LA diam:    3.70 cm 1.51 cm/m   AORTA Ao Root diam: 4.00 cm MITRAL VALVE MV Area (PHT): 4.89 cm     SHUNTS MV Peak grad:  4.2 mmHg     Systemic Diam: 1.90 cm MV Mean grad:  1.0 mmHg MV Vmax:       1.03 m/s MV Vmean:      54.2 cm/s MV Decel Time: 155 msec MV E velocity: 109.00 cm/s MV A velocity: 52.40 cm/s MV E/A ratio:  2.08 Vishnu Priya Mallipeddi Electronically signed by Diannah Late Mallipeddi Signature Date/Time: 03/07/2024/4:59:24 PM    Final    DG Chest Port 1 View Result Date: 03/06/2024 EXAM: 1 VIEW(S) XRAY OF THE CHEST 03/06/2024 09:53:00 PM COMPARISON: Comparison with 02/01/2024. CLINICAL HISTORY: SOB. FINDINGS: LUNGS AND PLEURA: Low lung volumes.  Pulmonary vascularity is accentuated. Patchy airspace opacities in the right mid and lower lung with airspace opacities in the left lower lung. Small right pleural effusion. No pneumothorax. HEART AND MEDIASTINUM: Stable cardiomegaly. Aortic atherosclerotic calcification. The cardiomediastinal silhouette is accentuated. BONES AND SOFT TISSUES: No acute osseous abnormality. IMPRESSION: 1. Findings concerning for multifocal pneumonia. Small pleural effusion. Electronically signed by: Norman Gatlin MD 03/06/2024 09:59 PM EST RP Workstation: [id number]     Assessment and Plan:  # Acute on chronic hypoxic respiratory failure: # Acute on chronic renal disease: # Acute on chronic HFpEF:  Multifactoral volume overload.  Systolic function is normal.  Echo with report of severe LVH and grade 3 diastolic dysfunction.    Unfortunately there is not much to add from a cardiac standpoint at this point.  Renal function and hypotension prohibit GDMT titration.  Ideally we would get a CVP and CoOX.  Unable to get a central line in his neck due to body habitus.  Would consider a PICC line but given his renal dysfunction this would have to be cleared by nephrology.  Would be reasonable to rule out cardiac amyloidosis with a PYP scan as an outpatient.  Volume removal led by nephrology with CRRT.  # OSA:  Needs to use CPAP as outpatient.   # Presumed cardiogenic shock:  No clear evidence that this is septic.  Unable to get COOX as above.  On levophed for BP support.    # Hyperlipidemia:  ROsuvastatin  on hold.   Risk Assessment/Risk Scores:       [place]  Heart Association (NYHA) Functional Class NYHA Class IV     Total critical care time: 45 minutes. Critical care time was exclusive of separately billable procedures and treating other patients. Critical care was necessary to treat or prevent imminent or life-threatening deterioration. Critical care was time spent personally by me on the following activities:  development of treatment plan with patient and/or surrogate as well as nursing, discussions with consultants, evaluation of patient's response to treatment, examination of patient, obtaining history from patient or surrogate, ordering and performing treatments and interventions, ordering and review of  laboratory studies, ordering and review of radiographic studies, pulse oximetry and re-evaluation of patient's condition.   For questions or updates, please contact [facility] Please consult [email or web address] for contact info under      Signed, Annabella Scarce, MD  03/09/2024 1:52 PM

## 2024-03-09 NOTE — Progress Notes (Signed)
 Pt with increased agitation. Trying to get out of bed, take off BiPAP mask, and remove safety mitts yelling take it off. Provider at bedside to evaluate. Mitts and BiPAP mask taken off and placed on 4L [place] per provider verbal order. ABG drawn by provider and sent with AM labs. RT notified of ABG draw. Pt resting comfortably with eyes closed. Bed in lowest position, fall mats on the floor, and call bell within reach.

## 2024-03-09 NOTE — Progress Notes (Signed)
 [facility] CONSULT NOTE    Date: 03/09/2024                  Patient Name:  Dave Brown  MRN: [id number]  DOB: [birth year]  Age / Sex: 78 y.o., male         PCP: Debrah Josette ORN., PA-C                 Service Requesting Consult: ICU                  Reason for consult:AKI             History of Present Illness: Patient is a 78 y.o. 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 with Acute on Chronic Hypoxic Respiratory Failure in the setting of Acute Decompensated HFpEF, circulatory shock, severe respiratory acidosis and AKI requiring initiation of CRRT.  He was started on CRRT last night and has been tolerating so far.  He was admitted to the hospital twice in the last 3 weeks with history of congestive heart failure.  He is presently on norepinephrine for blood pressure support.  Medications: Outpatient medications: Medications Prior to Admission  Medication Sig Dispense Refill Last Dose/Taking   albuterol  (PROVENTIL ) (2.5 MG/3ML) 0.083% nebulizer solution Take 2.5 mg by nebulization every 6 (six) hours as needed for shortness of breath or wheezing.      hydrocortisone sodium succinate (SOLU-CORTEF) 100 MG injection Inject 2 mLs (100 mg total) into the vein every 8 (eight) hours.      midodrine (PROAMATINE) 10 MG tablet Take 1 tablet (10 mg total) by mouth 3 (three) times daily with meals.      norepinephrine (LEVOPHED) 4-5 MG/250ML-% SOLN Inject 0-10 mcg/min into the vein continuous.      rosuvastatin  (CRESTOR ) 10 MG tablet Take 10 mg by mouth daily.      sertraline  (ZOLOFT ) 25 MG tablet Take 25 mg by mouth in the morning.      TYLENOL  325 MG tablet Take 325-650 mg by mouth every 8 (eight) hours as needed for mild pain (pain score 1-3) (or headaches).       Discontinued Meds:    Medications Discontinued During This Encounter  Medication Reason   norepinephrine (LEVOPHED) 4mg  in (0.016 mg/mL) premix infusion     Current medications: Current Facility-Administered Medications  Medication Dose Route Frequency Provider Last Rate Last Admin   Chlorhexidine Gluconate Cloth 2 % PADS 6 each  6 each Topical Daily Kathrene Almarie Bake, NP   6 each at 03/08/24 2008   dexmedetomidine (PRECEDEX) 400 MCG/100ML (4 mcg/mL) infusion  0-1.2 mcg/kg/hr Intravenous Titrated Ouma, Elizabeth Achieng, NP 28.8 mL/hr at 03/09/24 0900 0.8 mcg/kg/hr at 03/09/24 0900   docusate sodium (COLACE) capsule 100 mg  100 mg Oral BID PRN Kathrene Almarie Bake, NP       haloperidol lactate (HALDOL) injection 5 mg  5 mg Intravenous Once Dgayli, Khabib, MD       heparin injection 1,000-6,000 Units  1,000-6,000 Units CRRT PRN Lateef, Munsoor, MD       heparin injection 5,000 Units  5,000 Units Subcutaneous Q8H Kathrene Almarie Bake, NP   5,000 Units at 03/09/24 0506   heparin lock flush 100 unit/mL  500 Units Intravenous Once PRN Isadora Hose, MD       insulin  aspart (novoLOG ) injection 0-6 Units  0-6 Units Subcutaneous Q4H Ouma, Almarie Bake, NP       ipratropium-albuterol  (DUONEB) 0.5-2.5 (3) MG/3ML nebulizer solution 3 mL  3 mL Nebulization Q6H Ouma, Almarie Bake, NP   3 mL at 03/09/24 0757   ipratropium-albuterol  (DUONEB) 0.5-2.5 (3) MG/3ML nebulizer solution 3 mL  3 mL Nebulization Q6H PRN Kathrene Almarie Bake, NP       norepinephrine (LEVOPHED) 4mg  in (0.016 mg/mL) premix infusion  0-40 mcg/min Intravenous Titrated Ouma, Elizabeth Achieng, NP 37.5 mL/hr at 03/09/24 0900 10 mcg/min at 03/09/24 0900   Oral care mouth rinse  15 mL Mouth Rinse 4 times per day Kathrene Almarie Bake, NP   15 mL at 03/09/24 0800   Oral care mouth rinse  15 mL Mouth Rinse PRN Kathrene Almarie Bake, NP       polyethylene glycol (MIRALAX / GLYCOLAX) packet 17 g  17 g Oral Daily PRN Kathrene Almarie Bake, NP       prismasol BGK 4/2.5 infusion   CRRT Continuous Marcelino, Munsoor, MD 400 mL/hr at 03/08/24 2207 New Bag at 03/08/24 2207   prismasol BGK 4/2.5 infusion   CRRT Continuous Marcelino, Munsoor, MD 400 mL/hr at 03/08/24 2207 New Bag at 03/08/24 2207   prismasol BGK 4/2.5 infusion   CRRT Continuous Lateef, Munsoor, MD 2,500 mL/hr at 03/09/24 0647 New Bag at 03/09/24 0647   sterile water (preservative free) injection               Allergies: No Known Allergies    Past Medical History: Past Medical History:  Diagnosis Date   CHF (congestive heart failure) (HCC)    Degenerative joint disease    High cholesterol    Hypertension    Kidney stones    Obesity    Respiratory failure with hypoxia (HCC)    Sleep apnea      Past Surgical History: No past surgical history on file.   Family History: No family history on file.   Social History: Social History   Socioeconomic History   Marital status: Widowed    Spouse name: Not on file   Number of children: 4   Years of education: Not on file   Highest education level: Not on file  Occupational History   Not on file  Tobacco Use   Smoking status: Former    Types: Cigars    Quit date: 05/07/1998    Years since quitting: 25.8   Smokeless tobacco: Not on file  Vaping Use   Vaping status: Never Used  Substance and Sexual Activity   Alcohol use: No   Drug use: No   Sexual activity: Not on file    Comment: pt. stated,  I smoked 5 cigars a day.  Other Topics Concern   Not on file  Social History Narrative   Not on file   Social Drivers of Health   Financial Resource Strain: Not on file  Food Insecurity: No Food Insecurity (03/07/2024)   Hunger Vital Sign    Worried About Running Out of Food in the Last Year: Never true    Ran Out of Food in the Last Year: Never true  Transportation Needs: No Transportation Needs (03/07/2024)   PRAPARE - Administrator, Civil Service (Medical): No    Lack  of Transportation (Non-Medical): No  Physical Activity: Not on file  Stress: Not on file  Social Connections: Unknown (03/07/2024)   Social Connection and Isolation Panel    Frequency of Communication with Friends  and Family: Three times a week    Frequency of Social Gatherings with Friends and Family: Three times a week    Attends Religious Services: Patient declined    Active Member of Clubs or Organizations: Patient declined    Attends Banker Meetings: Patient declined    Marital Status: Widowed  Intimate Partner Violence: Not At Risk (03/07/2024)   Humiliation, Afraid, Rape, and Kick questionnaire    Fear of Current or Ex-Partner: No    Emotionally Abused: No    Physically Abused: No    Sexually Abused: No     Review of Systems: As per HPI  Vital Signs: Blood pressure (!) 96/58, pulse (!) 56, temperature 98.2 F (36.8 C), resp. rate 20, height 5' 9 (1.753 m), weight 131.8 kg, SpO2 99%.  Weight trends: Filed Weights   03/08/24 1842 03/09/24 0315  Weight: (!) 144.1 kg 131.8 kg    Physical Exam: Physical Exam: General:  No acute distress  Head:  Normocephalic, atraumatic. Moist oral mucosal membranes  Eyes:  Anicteric  Neck:  Supple  Lungs:   Clear to auscultation, normal effort  Heart:  S1S2 no rubs  Abdomen:   Soft, nontender, bowel sounds present  Extremities:  peripheral edema.  Neurologic:  Awake, alert, following commands  Skin:  No lesions  Access:     Lab results:  Basic Metabolic Panel: Recent Labs  Lab 03/06/24 2129 03/07/24 0442 03/08/24 0311 03/08/24 1932 03/09/24 0435  NA 141 141 136 133* 129*  K 4.1 4.1 4.8 5.4* 4.8  CL 102 101 97* 94* 94*  CO2 31 26 29 27 26   GLUCOSE 48* 70 141* 142* 226*  BUN 46* 51* 64* 74* 60*  CREATININE 4.15* 4.58* 5.86* 6.62* 5.53*  CALCIUM  9.0 8.8* 8.9 9.1 8.1*  MG  --  2.6*  --  2.7* 2.5*  PHOS  --  5.7* 7.3* 8.6* 6.6*  6.5*    Creatinine  Date/Time Value Ref Range Status  04/13/2022 01:34  PM 2.86 (H) 0.61 - 1.24 mg/dL Final  96/73/7984 90:64 AM 2.0 (H) 0.7 - 1.3 mg/dL Final  91/70/7985 90:75 AM 2.0 (H) 0.7 - 1.3 mg/dL Final  97/71/7985 90:98 AM 2.2 (H) 0.7 - 1.3 mg/dL Final   Creatinine, Ser  Date/Time Value Ref Range Status  03/09/2024 04:35 AM 5.53 (H) 0.61 - 1.24 mg/dL Final  88/85/7974 92:67 PM 6.62 (H) 0.61 - 1.24 mg/dL Final  88/85/7974 96:88 AM 5.86 (H) 0.61 - 1.24 mg/dL Final  88/86/7974 95:57 AM 4.58 (H) 0.61 - 1.24 mg/dL Final  88/87/7974 90:70 PM 4.15 (H) 0.61 - 1.24 mg/dL Final  89/86/7974 90:67 AM 2.80 (H) 0.61 - 1.24 mg/dL Final  89/87/7974 96:44 AM 2.60 (H) 0.61 - 1.24 mg/dL Final  89/88/7974 96:74 AM 2.73 (H) 0.61 - 1.24 mg/dL Final  89/89/7974 87:45 AM 2.52 (H) 0.61 - 1.24 mg/dL Final  89/90/7974 96:67 PM 2.55 (H) 0.61 - 1.24 mg/dL Final  98/90/7977 88:79 AM 2.77 (H) 0.61 - 1.24 mg/dL Final  98/91/7977 97:62 PM 3.06 (H) 0.61 - 1.24 mg/dL Final  98/92/7977 95:61 AM 3.49 (H) 0.61 - 1.24 mg/dL Final  91/76/7986 89:99 AM 1.88 (H) 0.50 - 1.35 mg/dL Final  94/92/7986 98:53 PM 1.94 (H) 0.50 - 1.35 mg/dL Final    CBC: Recent Labs  Lab 03/06/24 2129 03/07/24 0442 03/08/24 0311 03/08/24 1932 03/09/24 0435  WBC 11.0* 10.5 12.7* 17.1* 16.2*  NEUTROABS  --   --   --  15.3*  --  HGB 12.3* 11.7* 11.7* 12.0* 11.6*  HCT 40.9 39.3 38.6* 39.8 38.6*  MCV 108.8* 109.5* 107.2* 107.6* 106.9*  PLT 138* 121* 139* 179 153    Microbiology: Results for orders placed or performed during the hospital encounter of 03/08/24  MRSA Next Gen by PCR, Nasal     Status: None   Collection Time: 03/08/24  7:23 PM   Specimen: Nasal Mucosa; Nasal Swab  Result Value Ref Range Status   MRSA by PCR Next Gen NOT DETECTED NOT DETECTED Final    Comment: (NOTE) The GeneXpert MRSA Assay (FDA approved for NASAL specimens only), is one component of a comprehensive MRSA colonization surveillance program. It is not intended to diagnose MRSA infection nor to guide or monitor treatment  for MRSA infections. Test performance is not FDA approved in patients less than 6 years old. Performed at [facility], [address] [place], [place] [address]     Urinalysis: No results for input(s): COLORURINE, LABSPEC, PHURINE, GLUCOSEU, HGBUR, BILIRUBINUR, KETONESUR, PROTEINUR, UROBILINOGEN, NITRITE, LEUKOCYTESUR in the last 72 hours.  Invalid input(s): APPERANCEUR   Imaging:  US  Abdomen Complete Result Date: 03/07/2024 EXAM: COMPLETE ABDOMINAL ULTRASOUND TECHNIQUE: Real-time ultrasonography of the abdomen was performed. COMPARISON: None available. CLINICAL HISTORY: Acute kidney injury, thrombocytopenia. FINDINGS: LIVER: Hepatic steatosis. No intrahepatic biliary ductal dilatation. Patent portal vein with antegrade flow. BILIARY SYSTEM: The gallbladder was unable to be visualized due to body habitus and difficulty with positioning. The common bile duct measures 4 mm. No cholelithiasis. KIDNEYS: Right kidney measures 8.2 cm in length. Left kidney measures 9.9 cm in length. There is increased cortical echogenicity in both kidneys. A 2.8 cm cyst is present in the right kidney, which does not require follow up. Limited visualization of the left kidney. PANCREAS: The pancreas was not visualized. SPLEEN: Splenomegaly measuring 16.7 cm greatest dimension. Volume of 923 ml. VESSELS: Poor visualization of the aorta and IVC. OTHER: The exam is significantly limited by body habitus and inability to roll / position properly. IMPRESSION: 1. Limited exam. Gallbladder not visualized. 2. Increased cortical echogenicity in both kidneys, compatible with medical renal disease. 3. Splenomegaly measuring 16.7 cm (volume 923 mL). 4. Hepatic steatosis. Electronically signed by: Norman Gatlin MD 03/07/2024 07:25 PM EST RP Workstation: [id number]   CT CHEST WO CONTRAST Result Date: 03/07/2024 CLINICAL DATA:  Pneumonia complication suspected. EXAM: CT CHEST WITHOUT CONTRAST  TECHNIQUE: Multidetector CT imaging of the chest was performed following the standard protocol without IV contrast. RADIATION DOSE REDUCTION: This exam was performed according to the departmental dose-optimization program which includes automated exposure control, adjustment of the mA and/or kV according to patient size and/or use of iterative reconstruction technique. COMPARISON:  Chest radiograph dated 03/06/2024. FINDINGS: Evaluation of this exam is limited in the absence of intravenous contrast. Cardiovascular: Borderline cardiomegaly. No pericardial effusion. Advanced 3 vessel coronary vascular calcification. Mild atherosclerotic calcification of the thoracic aorta. No aneurysmal dilatation. There is mild dilatation of the main pulmonary trunk suggestive of pulmonary hypertension. Mediastinum/Nodes: No hilar or mediastinal adenopathy. The esophagus and the thyroid gland are grossly unremarkable no mediastinal fluid collection. Lungs/Pleura: Small bilateral pleural effusions with partial compressive atelectasis of the lower lobes versus pneumonia. No pneumothorax. A 1 cm nodular density in the distal trachea (57/4) likely a mucous secretion. A polyp is less likely. Attention on follow-up imaging recommended. The central airways are patent. Upper Abdomen: Indeterminate 3.5 cm right adrenal nodule, possibly an adenoma. MRI can provide better evaluation. Musculoskeletal: Osteopenia with degenerative changes spine. No  acute osseous pathology. A 2.5 cm probable sebaceous cyst the subcutaneous soft tissues of the posterior thoracic wall to the right of the midline. IMPRESSION: 1. Small bilateral pleural effusions with partial compressive atelectasis of the lower lobes versus pneumonia. 2.  Aortic Atherosclerosis (ICD10-I70.0). Electronically Signed   By: Vanetta Chou M.D.   On: 03/07/2024 18:40   ECHOCARDIOGRAM LIMITED Result Date: 03/07/2024    ECHOCARDIOGRAM LIMITED REPORT   Patient Name:   MELDRICK BUTTERY Date  of Exam: 03/07/2024 Medical Rec #:  [id number]   Height:       69.0 in Accession #:    [id number]  Weight:       300.5 lb Date of Birth:  16-Feb-1946    BSA:          2.456 m Patient Age:    78 years    BP:           86/46 mmHg Patient Gender: M           HR:           66 bpm. Exam Location:  [facility] Procedure: Limited Echo, Cardiac Doppler and Intracardiac Opacification Agent            (Both Spectral and Color Flow Doppler were utilized during            procedure). Indications:    Shock R57.9  History:        Patient has prior history of Echocardiogram examinations, most                 recent 02/02/2024. CHF; Risk Factors:Sleep Apnea, Hypertension                 and Dyslipidemia. Hx of COVID-19.  Sonographer:    Aida Pizza RCS Referring Phys: [id number] TORIBIO JAYSON SHARPS IMPRESSIONS  1. Limited Echo to evaluate LV function.  2. No LV thrombus by Definity. Left ventricular ejection fraction, by estimation, is 60 to 65%. The left ventricle has normal function. The left ventricle has no regional wall motion abnormalities. 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 Left ventricular diastolic parameters are consistent with Grade III diastolic dysfunction (restrictive). Elevated left atrial pressure.  3. Right ventricular systolic function is normal. The right ventricular size is normal. Tricuspid regurgitation signal is inadequate for assessing PA pressure.  4. Aortic dilatation noted. There is mild dilatation of the aortic root, measuring 40 mm.  5. The inferior vena cava is dilated in size with <50% respiratory variability, suggesting right atrial pressure of 15 mmHg. Comparison(s): No significant change from prior study. FINDINGS  Left Ventricle: No LV thrombus by Definity. Left ventricular ejection fraction, by estimation, is 60 to 65%. The left ventricle has normal function. The left ventricle has no regional wall motion abnormalities. Definity contrast agent was given IV to delineate the  left ventricular endocardial borders. The left ventricular internal cavity size was normal in size. 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 Left ventricular diastolic parameters are consistent with Grade III diastolic dysfunction (restrictive). Elevated left atrial pressure. Right Ventricle: The right ventricular size is normal. No increase in right ventricular wall thickness. Right ventricular systolic function is normal. Tricuspid regurgitation signal is inadequate for assessing PA pressure. Left Atrium: Left atrial size was normal in size. Right Atrium: Right atrial size was normal in size. Pericardium: There is no evidence of pericardial effusion. Mitral Valve: The mitral valve is normal in structure. MV peak gradient, 4.2  mmHg. The mean mitral valve gradient is 1.0 mmHg. Tricuspid Valve: The tricuspid valve is normal in structure. Aortic Valve: The aortic valve is tricuspid. Pulmonic Valve: The pulmonic valve was normal in structure. Aorta: Aortic dilatation noted. There is mild dilatation of the aortic root, measuring 40 mm. Venous: The inferior vena cava is dilated in size with less than 50% respiratory variability, suggesting right atrial pressure of 15 mmHg. IAS/Shunts: No atrial level shunt detected by color flow Doppler. LEFT VENTRICLE PLAX 2D LVIDd:         3.80 cm LVIDs:         2.40 cm LV PW:         1.60 cm LV IVS:        1.70 cm LVOT diam:     1.90 cm LVOT Area:     2.84 cm  RIGHT VENTRICLE TAPSE (M-mode): 1.4 cm LEFT ATRIUM         Index LA diam:    3.70 cm 1.51 cm/m   AORTA Ao Root diam: 4.00 cm MITRAL VALVE MV Area (PHT): 4.89 cm     SHUNTS MV Peak grad:  4.2 mmHg     Systemic Diam: 1.90 cm MV Mean grad:  1.0 mmHg MV Vmax:       1.03 m/s MV Vmean:      54.2 cm/s MV Decel Time: 155 msec MV E velocity: 109.00 cm/s MV A velocity: 52.40 cm/s MV E/A ratio:  2.08 Vishnu Priya Mallipeddi Electronically signed by Diannah Late Mallipeddi Signature Date/Time:  03/07/2024/4:59:24 PM    Final      Assessment & Plan:  78 y.o. 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  He was started on CRRT last night and has been tolerating so far.  He was admitted to the hospital twice in the last 3 weeks with history of congestive heart failure.  He is presently on norepinephrine for blood pressure support.  #1: Acute kidney injury on chronic kidney disease: Patient has chronic kidney disease with a baseline creatinine of 2.5-2.8.  Acute kidney injury with an estimated GFR of 9 cc/min on hyperkalemia with metabolic acidosis is most likely secondary to sepsis possibly secondary to pneumonia and circulatory shock with decreased cardiac output leading to ischemic nephropathy.  Patient was initiated on CRRT for oligoanuric renal failure with hyperkalemia and hypotension. Presently tolerating CRRT with no net fluid removal.  If the blood pressure improves we will start to remove at least 100 cc to 150 cc/h.  Renal ultrasound showed chronicity.  Will dose all medications for GFR less than 20 cc/min  #2: COPD exacerbation/pneumonia/acute respiratory failure: Present was initially on BiPAP which was discontinued this morning.  Patient is more awake today.  Will continue care as per ICU protocols.  #3: Sepsis/pneumonia: Patient was treated with Rocephin and azithromycin on admission.  #4: Hyperkalemia: Has improved after initiating the CRRT.  #5: Congestive heart failure: Patient initially had elevated troponins probably secondary to demand ischemia.  Will attempt fluid removal once patient becomes hemodynamically stable.  #6: Anemia/MGUS: Will continue to  monitor closely.  Will continue to monitor closely.  LOS: 1 Pinkey Edman, MD [facility]. 11/15/202510:48 AM

## 2024-03-09 NOTE — Progress Notes (Signed)
 Initial Nutrition Assessment  DOCUMENTATION CODES:  Obesity unspecified  INTERVENTION:  Renal Multivitamin w/ minerals daily Advance diet as medically appropriate If unable to advance diet, recommend starting enteral nutrition: Recommend starting Vital 1.5 at 20 mL/hr and advance by 10 mL every 4 hours to goal rate of 60 mL/hr (1440 mL/day) 60 mL ProSource TF20 - BID Regimen at goal provides 2320 calories, 137 gm protein, and 1103 mL free water per day.   NUTRITION DIAGNOSIS:  Increased nutrient needs related to acute illness (CRRT) as evidenced by estimated needs.  GOAL:  Patient will meet greater than or equal to 90% of their needs  MONITOR:  Labs, I & O's, Weight trends, Diet advancement  REASON FOR ASSESSMENT:  Consult Assessment of nutrition requirement/status  ASSESSMENT:  78 y.o. male presented to the [facility] ED with shortness of breath and increased edema. PMH includes CHF, HTN, CKD IV, and HLD. Admitted for acute on chronic HF and AKI on CKD; transferred to [facility] for initiation of CRRT.   11/12 - Admitted to [facility] 11/14- transferred to [facility]; CRRT initiated 11/15 - BiPAP removed   RD working remotely at time of assessment. Pt currently admitted to the ICU. Per chart review, pt currently only oriented to person. Currently is NPO, when admitted to [facility], meal intakes were documented between 75-100%. Discussed with RN, at this time no plan to advance diet.   Per chart review, minimal weights within chart to assess for weight loss over the past year. Pt weight was significantly higher when discharged from [facility], suspect likely related to edema and fluid status. Pt noted to have mild/moderate edema on all extremities.   Admission Weight: 144.1 kg Current Weight: 131.8 kg  Nutrition Related Medications: Novolog  0-6 units q4h Drips Precedex Levophed Labs: Sodium 129, Potassium 4.8, BUN 60, Creatinine 5.53, Phosphorus 6.5, Magnesium 2.5, Folate 11.1,  Vitamin B12 2517, Hgb A1c 5.2  CBG: 88-202 mg/dL x 24 hrs   UOP: 5 mL x 24 hrs CRRT output: 562 mL x 24 hrs  NUTRITION - FOCUSED PHYSICAL EXAM: Deferred to follow-up.   Diet Order:   Diet Order             Diet NPO time specified  Diet effective now                  EDUCATION NEEDS:  Not appropriate for education at this time  Skin:  Skin Assessment: Reviewed RN Assessment  Last BM:  11/15 - smear  Height:  Ht Readings from Last 1 Encounters:  03/09/24 5' 9 (1.753 m)   Weight:  Wt Readings from Last 1 Encounters:  03/09/24 131.8 kg   Ideal Body Weight:  72.7 kg  BMI:  Body mass index is 42.91 kg/m.  Estimated Nutritional Needs:  Kcal:  2200-2400 Protein:  120-140 grams Fluid:  >/= 2 L   Nestora Glatter RD, LDN Clinical Dietitian

## 2024-03-09 NOTE — Progress Notes (Signed)
 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 He denied any fevers, chills, chest pain.     Of note, the patient was recently admitted to the hospital from 02/01/2024 to 02/05/2024 for respiratory failure due to CHF.  The patient did not require oxygen  at the time of his discharge.  His discharge weight was 286.  At his last office visit with his primary provider his weight was 291 pounds on 02/22/24.  Follow up with pulmonary on 03/04/24 office weight 29.   He was discharged home with furosemide  40 mg daily during last hospitalization   ED Course: Initial Vital Signs: afebrile and hemodynamically stable. Oxygen  saturation was 93-97% on 4 L  Significant Labs: WBC 11.0, hemoglobin 12.3, platelets 138. Sodium 141, potassium 4.1, bicarbonate 31, serum creatinine 4.15. proBNP 16,139. PCT 0.13. Lactic acid 1.9 >> 0.8. COVID-negative  Imaging Chest X-ray>>patchy opacities right lower lobe and left lower lobe  Medications Administered: furosemide  40 mg IV, Solu-Medrol  125 mg, ceftriaxone, and azithromycin. He was given albuterol  and Atrovent.    [facility] asked to admit for further workup and treatment.  He gradually became hypotensive requiring initiation of low dose Levophed (3 to 5 mcg), PCCM was consulted.  Nephrology was consulted for oliguria. Case was discussed with nephrology and it was felt the patient need to be initiated on CRRT. Subsequently, initial request was made to transfer the patient to [facility]. In order to expedite care, he is being transferred to  [facility] regional ICU to critical care service where nephrology will be consulted for continued management of his renal failure and fluid overload.    Please see Significant Hospital Events section below for full detailed hospital course.  Pertinent  Medical History  -CHF  Significant Hospital Events: Including procedures, antibiotic start and stop dates in addition to other pertinent events   11/12: Admitted to [facility] to [place] for treatment of Acute Decompensated HFpEF and AKI. 11/13: BP decreased, started on Levophed.  PCCM consulted.  ABX discontinued. 11/14: Transfer to [facility] for initiation of CRRT 11/15: seizure like episode, intubated  Interim History / Subjective:  Patient was encephalopathic throughout the day  Objective    Blood pressure 120/66, pulse (!) 52, temperature 98.2 F (36.8 C), resp. rate (!) 22, height 5' 9 (1.753 m), weight 131.8 kg, SpO2 92%.    Vent Mode: PRVC FiO2 (%):  [40 %-60 %] 60 % Set Rate:  [22 bmp] 22 bmp Vt Set:  [480 mL] 480 mL PEEP:  [5 cmH20-8 cmH20] 8 cmH20 Pressure Support:  [15 cmH20] 15 cmH20   Intake/Output Summary (Last 24 hours) at 03/09/2024 1856 Last data filed at 03/09/2024 1800 Gross per 24 hour  Intake 2339.98 ml  Output 1360 ml  Net 979.98 ml   Filed Weights   03/08/24 1842 03/09/24 0315  Weight: (!) 144.1 kg 131.8 kg    Examination: Physical Exam Constitutional:      Appearance: He is obese. He is ill-appearing.  Cardiovascular:     Rate and Rhythm: Normal rate and regular rhythm.  Pulses: Normal pulses.     Heart sounds: Normal heart sounds.  Pulmonary:     Breath sounds: No wheezing.  Neurological:     Mental Status: He is disoriented.     Assessment and Plan   #Acute Hypoxic Respiratory Failure #Acute on Chronic HFpEF #Acute Renal Failure on CKD #Cardiogenic Shock #T2DM #Toxic Metabolic Encephalopathy  Neuro - Encephalopathic secondary to hypercarbia, without improvement following BiPAP.  Required dexmedetomidine for sedation to tolerate care (CRRT, BIPAP, etc...). Patient developed as seizure like episode with further encephalopathy and respiratory distress. Now intubated and sedated with propofol. Will need EEG to assess for seizure, neurology consult, and brain imaging once stable. CV - Decompensated HFpEF with anasarca and concomitant renal failure. TTE with severe LVH and diastolic dysfunction, suspect also component of pulmonary hypertension from untreated OSA/OHS. Currently attempting to mobilize fluids with CRRT. Cardiology consulted, recs appreciated. Vasopressors for goal MAP > 65 mmHg for likely cardiogenic shock. Pulm - Intubated due to worsening encephalopathy as well as worsening respiratory failure with hypoxia and hypercapnia. CRRT for volume mobilization, and now vented, trending blood gas to ensure ventilation and oxygenation. GI - NPO, PPI for SUP Renal - Renal failure on top of CKD, likely component of cardiorenal syndrome. Started on CRRT, attempted volume removal which he tolerated during the day, running even now given hypotension post intubation and vasopressor requirements. Endo - ICU glycemic protocol. Hem/Onc - Heparin gtt started because he continues to clot his CRRT circuit. ID - no sign of active infection, monitor fever curve.  Labs   CBC: Recent Labs  Lab 03/06/24 2129 03/07/24 0442 03/08/24 0311 03/08/24 1932 03/09/24 0435  WBC 11.0* 10.5 12.7* 17.1* 16.2*  NEUTROABS  --   --   --  15.3*  --   HGB 12.3* 11.7* 11.7* 12.0* 11.6*  HCT 40.9 39.3 38.6* 39.8 38.6*  MCV 108.8* 109.5* 107.2* 107.6* 106.9*  PLT 138* 121* 139* 179 153    Basic Metabolic Panel: Recent Labs  Lab 03/07/24 0442 03/08/24 0311 03/08/24 1932 03/09/24 0435 03/09/24 1757  NA 141 136 133* 129* 131*  K 4.1 4.8 5.4* 4.8 4.8  CL 101 97* 94* 94* 101  CO2 26 29 27 26  21*  GLUCOSE 70 141* 142* 226* 131*  BUN 51* 64* 74* 60* 46*  CREATININE 4.58* 5.86* 6.62* 5.53* 3.92*   CALCIUM  8.8* 8.9 9.1 8.1* 8.0*  MG 2.6*  --  2.7* 2.5*  --   PHOS 5.7* 7.3* 8.6* 6.6*  6.5* 3.5   GFR: Estimated Creatinine Clearance: 20.9 mL/min (A) (by C-G formula based on SCr of 3.92 mg/dL (H)). Recent Labs  Lab 03/06/24 2129 03/06/24 2255 03/07/24 0442 03/08/24 0311 03/08/24 1932 03/09/24 0435 03/09/24 0840  PROCALCITON  --   --  0.13  --  0.47  --   --   WBC 11.0*  --  10.5 12.7* 17.1* 16.2*  --   LATICACIDVEN 1.0 0.8  --   --  0.8  --  1.2    Liver Function Tests: Recent Labs  Lab 03/06/24 2129 03/07/24 0442 03/08/24 0311 03/08/24 1932 03/09/24 0435 03/09/24 1757  AST 20 23  --  23  --   --   ALT 18 18  --  20  --   --   ALKPHOS 91 88  --  90  --   --   BILITOT 0.3 0.3  --  0.4  --   --   PROT 6.7 6.7  --  7.0  --   --  ALBUMIN 3.7 3.7 3.8 3.9 3.6 3.4*   No results for input(s): LIPASE, AMYLASE in the last 168 hours. No results for input(s): AMMONIA in the last 168 hours.  ABG    Component Value Date/Time   PHART 7.25 (L) 03/09/2024 1534   PCO2ART 46 03/09/2024 1534   PO2ART 102 03/09/2024 1534   HCO3 20.2 03/09/2024 1534   ACIDBASEDEF 7.0 (H) 03/09/2024 1534   O2SAT 99.2 03/09/2024 1534     Coagulation Profile: Recent Labs  Lab 03/08/24 1932  INR 1.0    Cardiac Enzymes: No results for input(s): CKTOTAL, CKMB, CKMBINDEX, TROPONINI in the last 168 hours.  HbA1C: Hgb A1c MFr Bld  Date/Time Value Ref Range Status  03/08/2024 03:11 AM 5.2 4.8 - 5.6 % Final    Comment:    (NOTE) Diagnosis of Diabetes The following HbA1c ranges recommended by the American Diabetes Association (ADA) may be used as an aid in the diagnosis of diabetes mellitus.  Hemoglobin             Suggested A1C NGSP%              Diagnosis  <5.7                   Non Diabetic  5.7-6.4                Pre-Diabetic  >6.4                   Diabetic  <7.0                   Glycemic control for                       adults with diabetes.    05/02/2020  02:37 PM 6.9 (H) 4.8 - 5.6 % Final    Comment:    (NOTE) Pre diabetes:          5.7%-6.4%  Diabetes:              >6.4%  Glycemic control for   <7.0% adults with diabetes     CBG: Recent Labs  Lab 03/09/24 0050 03/09/24 0312 03/09/24 0735 03/09/24 1113 03/09/24 1701  GLUCAP 111* 115* 107* 88 127*    Review of Systems:   N/A  Past Medical History:  He,  has a past medical history of CHF (congestive heart failure) (HCC), Degenerative joint disease, High cholesterol, Hypertension, Kidney stones, Obesity, Respiratory failure with hypoxia (HCC), and Sleep apnea.   Surgical History:  No past surgical history on file.   Social History:   reports that he quit smoking about 25 years ago. His smoking use included cigars. He does not have any smokeless tobacco history on file. He reports that he does not drink alcohol and does not use drugs.   Family History:  His family history is not on file.   Allergies No Known Allergies   Home Medications  Prior to Admission medications   Medication Sig Start Date End Date Taking? Authorizing Provider  albuterol  (PROVENTIL ) (2.5 MG/3ML) 0.083% nebulizer solution Take 2.5 mg by nebulization every 6 (six) hours as needed for shortness of breath or wheezing. 03/04/24   [provider]  hydrocortisone sodium succinate (SOLU-CORTEF) 100 MG injection Inject 2 mLs (100 mg total) into the vein every 8 (eight) hours. 03/08/24   Evonnie Lenis, MD  midodrine (PROAMATINE) 10 MG tablet Take 1 tablet (10 mg total) by mouth 3 (  three) times daily with meals. 03/08/24   Evonnie Lenis, MD  norepinephrine (LEVOPHED) 4-5 MG/250ML-% SOLN Inject 0-10 mcg/min into the vein continuous. 03/08/24   Evonnie Lenis, MD  rosuvastatin  (CRESTOR ) 10 MG tablet Take 10 mg by mouth daily.    [provider]  sertraline  (ZOLOFT ) 25 MG tablet Take 25 mg by mouth in the morning.    [provider]  TYLENOL  325 MG tablet Take 325-650 mg by mouth every 8 (eight)  hours as needed for mild pain (pain score 1-3) (or headaches).    [provider]     The patient is critically ill due to acute hypoxic respiratory failure, renal failure, decompensated HFpEF.  Critical care was necessary to treat or prevent imminent or life-threatening deterioration. I personally performed high risk medication and infusion titration and management, titration, monitoring, and management of vasopressor/ionotrope infusion, mechanical ventilation management and titration, and renal replacement therapy. Critical care time was spent by me on the following activities: development of a treatment plan with the patient and/or surrogate as well as nursing, discussions with consultants, evaluation of the patient's response to treatment, examination of the patient, obtaining a history from the patient or surrogate, ordering and performing treatments and interventions, ordering and review of laboratory studies, ordering and review of radiographic studies, review of telemetry data including pulse oximetry, re-evaluation of patient's condition and participation in multidisciplinary rounds.   I personally spent 90 minutes providing critical care not including any separately billable procedures.   Belva November, MD [facility]

## 2024-03-09 NOTE — Plan of Care (Signed)

## 2024-03-09 NOTE — Procedures (Signed)
 Arterial Catheter Insertion Procedure Note  Dave Brown  [id number]  05-Jun-1945  Date:03/09/24  Time:4:18 PM    Provider Performing: Debbie LITTIE Katz    Procedure: Insertion of Arterial Line (63379) with US  guidance (23062)   Indication(s) Blood pressure monitoring and/or need for frequent ABGs  Consent Unable to obtain consent due to emergent nature of procedure.  Anesthesia Sedated with propofol Topical lidocaine  Time Out Deferred due to emergent nature   Sterile Technique Maximal sterile technique including full sterile barrier drape, hand hygiene, sterile gown, sterile gloves, mask, hair covering, sterile ultrasound probe cover (if used).   Procedure Description Area of catheter insertion was cleaned with chlorhexidine and draped in sterile fashion. With real-time ultrasound guidance an arterial catheter was placed into the left radial artery.  Appropriate arterial tracings confirmed on monitor.     Complications/Tolerance None; patient tolerated the procedure well.   EBL Minimal   Specimen(s) None  Debbie Katz, ACNP-BC Pulmonary Critical Care, [place] Phone: [phone]

## 2024-03-09 NOTE — Consult Note (Signed)
 PHARMACY - ANTICOAGULATION CONSULT NOTE  Pharmacy Consult for Heparin infusion Indication: CRRT clot prevention  No Known Allergies  Patient Measurements: Height: 5' 9 (175.3 cm) Weight: 131.8 kg (290 lb 9.1 oz) IBW/kg (Calculated) : 70.7  Vital Signs: Temp: 98.2 F (36.8 C) (11/15 0737) Temp Source: Axillary (11/15 0315) BP: 79/56 (11/15 1300) Pulse Rate: 56 (11/15 1300)  Labs: Recent Labs    03/08/24 0311 03/08/24 1932 03/09/24 0435  HGB 11.7* 12.0* 11.6*  HCT 38.6* 39.8 38.6*  PLT 139* 179 153  APTT  --  29  --   LABPROT  --  14.3  --   INR  --  1.0  --   CREATININE 5.86* 6.62* 5.53*    Estimated Creatinine Clearance: 14.8 mL/min (A) (by C-G formula based on SCr of 5.53 mg/dL (H)).   Medical History: Past Medical History:  Diagnosis Date   CHF (congestive heart failure) (HCC)    Degenerative joint disease    High cholesterol    Hypertension    Kidney stones    Obesity    Respiratory failure with hypoxia (HCC)    Sleep apnea     Medications:  No anticoagulation PTA  Assessment: Patient is a 78 y/o male with a PMH significant for HFpEF, COPD, and acute on chronic hypoxic respiratory failure, currently with AKI requiring CRRT. Despite heparin lock flushes, the CRRT circuit has continued to clot. Pharmacy is consulted for initiation and management of a heparin infusion to prevent further clotting during CRRT.  Baseline labs WNL: Hbg 11.6, PLT 153, INR 1.0, aPTT 29  Goal of Therapy:  Heparin level 0.3-0.7 units/ml Monitor platelets by anticoagulation protocol: Yes  Plan:  Per MD: no heparin bolus Initiate heparin infusion at 1,100 units/hr Heparin level 8 hours after start of infusion CBC daily while on heparin  Varian Innes N Malaya Cagley, PharmD Candidate 03/09/2024,1:15 PM

## 2024-03-09 NOTE — Consult Note (Signed)
 PHARMACY - ANTICOAGULATION CONSULT NOTE  Pharmacy Consult for Heparin infusion Indication: CRRT clot prevention  No Known Allergies  Patient Measurements: Height: 5' 9 (175.3 cm) Weight: 131.8 kg (290 lb 9.1 oz) IBW/kg (Calculated) : 70.7  Vital Signs: Temp: 98.1 F (36.7 C) (11/15 1945) Temp Source: Axillary (11/15 1945) BP: 118/66 (11/15 1930) Pulse Rate: 55 (11/15 2245)  Labs: Recent Labs    03/08/24 0311 03/08/24 1932 03/09/24 0435 03/09/24 1757 03/09/24 2159  HGB 11.7* 12.0* 11.6*  --   --   HCT 38.6* 39.8 38.6*  --   --   PLT 139* 179 153  --   --   APTT  --  29  --   --   --   LABPROT  --  14.3  --   --   --   INR  --  1.0  --   --   --   HEPARINUNFRC  --   --   --   --  0.32  CREATININE 5.86* 6.62* 5.53* 3.92*  --     Estimated Creatinine Clearance: 20.9 mL/min (A) (by C-G formula based on SCr of 3.92 mg/dL (H)).   Medical History: Past Medical History:  Diagnosis Date   CHF (congestive heart failure) (HCC)    Degenerative joint disease    High cholesterol    Hypertension    Kidney stones    Obesity    Respiratory failure with hypoxia (HCC)    Sleep apnea     Medications:  No anticoagulation PTA  Assessment: Patient is a 78 y/o male with a PMH significant for HFpEF, COPD, and acute on chronic hypoxic respiratory failure, currently with AKI requiring CRRT. Despite heparin lock flushes, the CRRT circuit has continued to clot. Pharmacy is consulted for initiation and management of a heparin infusion to prevent further clotting during CRRT.  Baseline labs WNL: Hbg 11.6, PLT 153, INR 1.0, aPTT 29  Goal of Therapy:  Heparin level 0.3-0.7 units/ml Monitor platelets by anticoagulation protocol: Yes  11/15 2159 HL 0.32, therapeutic x 1  Plan:  Per MD: no heparin bolus Continue heparin infusion at 1,100 units/hr Recheck HL w/ AM labs to confirm CBC daily while on heparin  Rankin CANDIE Dills, PharmD, [facility] 03/09/2024 10:56 PM

## 2024-03-09 NOTE — Consult Note (Signed)
 PHARMACY CONSULT NOTE - ELECTROLYTES  Pharmacy Consult for Electrolyte Monitoring and Replacement   Recent Labs: Height: 5' 9 (175.3 cm) Weight: 131.8 kg (290 lb 9.1 oz) IBW/kg (Calculated) : 70.7 Estimated Creatinine Clearance: 14.8 mL/min (A) (by C-G formula based on SCr of 5.53 mg/dL (H)). Potassium  Date Value  03/09/2024 4.8 mmol/L  07/18/2013 3.4 mEq/L (L)   Magnesium (mg/dL)  Date Value  88/84/7974 2.5 (H)   Calcium  (mg/dL)  Date Value  88/84/7974 8.1 (L)  07/18/2013 10.1   Albumin (g/dL)  Date Value  88/84/7974 3.6  07/18/2013 3.8   Phosphorus (mg/dL)  Date Value  88/84/7974 6.5 (H)  03/09/2024 6.6 (H)   Sodium  Date Value  03/09/2024 129 mmol/L (L)  07/18/2013 138 mEq/L    Assessment  Dave Brown is a 78 y.o. male presenting with AKI requiring CRRT. PMH significant for HFpEF and COPD requiring 2L supplemental oxygen  . Pharmacy has been consulted to monitor and replace electrolytes.  Diet: NPO MIVF: Primsasol CRRT fluid Pertinent medications: N/A  Goal of Therapy: Electrolytes WNL  Plan:  No replacement currently indicated BMP ordered twice daily while on CRRT Check Mg, Phos with AM labs  Thank you for allowing pharmacy to be a part of this patient's care.  Lucrezia Dehne A Amalio Loe, PharmD Clinical Pharmacist 03/09/2024 8:43 AM

## 2024-03-09 NOTE — Procedures (Signed)
 Intubation Procedure Note  Dave Brown  [id number]  1946/02/02  Date:03/09/24  Time:4:15 PM   Provider Performing:Dejion Grillo LITTIE Morene    Procedure: Intubation (31500)  Indication(s) Respiratory Failure  Consent Unable to obtain consent due to emergent nature of procedure.   Anesthesia Etomidate and Rocuronium   Time Out Deferred due to emergent nature   Sterile Technique Usual hand hygeine, masks, and gloves were used   Procedure Description Patient positioned in bed supine.  Sedation given as noted above.  Patient was intubated with endotracheal tube using Glidescope.  View was Grade 1 full glottis .  Number of attempts was 1.  Colorimetric CO2 detector was consistent with tracheal placement.   Complications/Tolerance None; patient tolerated the procedure well. Chest X-ray is ordered to verify placement.   EBL none   Specimen(s) None  Maryfrances Portugal, ACNP-BC Pulmonary Critical Care, [place] Phone: [phone]

## 2024-03-10 ENCOUNTER — Inpatient Hospital Stay

## 2024-03-10 ENCOUNTER — Other Ambulatory Visit: Payer: Self-pay

## 2024-03-10 DIAGNOSIS — J9601 Acute respiratory failure with hypoxia: Secondary | ICD-10-CM

## 2024-03-10 DIAGNOSIS — I5033 Acute on chronic diastolic (congestive) heart failure: Secondary | ICD-10-CM

## 2024-03-10 DIAGNOSIS — I509 Heart failure, unspecified: Secondary | ICD-10-CM

## 2024-03-10 DIAGNOSIS — J9621 Acute and chronic respiratory failure with hypoxia: Secondary | ICD-10-CM | POA: Diagnosis not present

## 2024-03-10 DIAGNOSIS — G928 Other toxic encephalopathy: Secondary | ICD-10-CM

## 2024-03-10 DIAGNOSIS — R570 Cardiogenic shock: Secondary | ICD-10-CM

## 2024-03-10 DIAGNOSIS — N179 Acute kidney failure, unspecified: Secondary | ICD-10-CM | POA: Diagnosis not present

## 2024-03-10 DIAGNOSIS — I130 Hypertensive heart and chronic kidney disease with heart failure and stage 1 through stage 4 chronic kidney disease, or unspecified chronic kidney disease: Secondary | ICD-10-CM

## 2024-03-10 LAB — TRIGLYCERIDES: Triglycerides: 152 mg/dL — ABNORMAL HIGH (ref <150)

## 2024-03-10 LAB — CBC
HCT: 33 % — ABNORMAL LOW (ref 39.0–52.0)
Hemoglobin: 10.7 g/dL — ABNORMAL LOW (ref 13.0–17.0)
MCH: 32.4 pg (ref 26.0–34.0)
MCHC: 32.4 g/dL (ref 30.0–36.0)
MCV: 100 fL (ref 80.0–100.0)
Platelets: 86 K/uL — ABNORMAL LOW (ref 150–400)
RBC: 3.3 MIL/uL — ABNORMAL LOW (ref 4.22–5.81)
RDW: 13.6 % (ref 11.5–15.5)
WBC: 9.7 K/uL (ref 4.0–10.5)
nRBC: 0 % (ref 0.0–0.2)

## 2024-03-10 LAB — RENAL FUNCTION PANEL
Albumin: 3.1 g/dL — ABNORMAL LOW (ref 3.5–5.0)
Albumin: 2.9 g/dL — ABNORMAL LOW (ref 3.5–5.0)
Anion gap: 10 (ref 5–15)
Anion gap: 10 (ref 5–15)
BUN: 33 mg/dL — ABNORMAL HIGH (ref 8–23)
BUN: 24 mg/dL — ABNORMAL HIGH (ref 8–23)
CO2: 23 mmol/L (ref 22–32)
CO2: 22 mmol/L (ref 22–32)
Calcium: 8.2 mg/dL — ABNORMAL LOW (ref 8.9–10.3)
Calcium: 8 mg/dL — ABNORMAL LOW (ref 8.9–10.3)
Chloride: 101 mmol/L (ref 98–111)
Chloride: 101 mmol/L (ref 98–111)
Creatinine, Ser: 2.98 mg/dL — ABNORMAL HIGH (ref 0.61–1.24)
Creatinine, Ser: 2.24 mg/dL — ABNORMAL HIGH (ref 0.61–1.24)
GFR, Estimated: 21 mL/min — ABNORMAL LOW (ref >60)
GFR, Estimated: 29 mL/min — ABNORMAL LOW (ref >60)
Glucose, Bld: 150 mg/dL — ABNORMAL HIGH (ref 70–99)
Glucose, Bld: 167 mg/dL — ABNORMAL HIGH (ref 70–99)
Phosphorus: 3.7 mg/dL (ref 2.5–4.6)
Phosphorus: 3.2 mg/dL (ref 2.5–4.6)
Potassium: 4.6 mmol/L (ref 3.5–5.1)
Potassium: 4.3 mmol/L (ref 3.5–5.1)
Sodium: 133 mmol/L — ABNORMAL LOW (ref 135–145)
Sodium: 133 mmol/L — ABNORMAL LOW (ref 135–145)

## 2024-03-10 LAB — GLUCOSE, CAPILLARY
Glucose-Capillary: 124 mg/dL — ABNORMAL HIGH (ref 70–99)
Glucose-Capillary: 133 mg/dL — ABNORMAL HIGH (ref 70–99)
Glucose-Capillary: 136 mg/dL — ABNORMAL HIGH (ref 70–99)
Glucose-Capillary: 144 mg/dL — ABNORMAL HIGH (ref 70–99)
Glucose-Capillary: 144 mg/dL — ABNORMAL HIGH (ref 70–99)
Glucose-Capillary: 171 mg/dL — ABNORMAL HIGH (ref 70–99)
Glucose-Capillary: 94 mg/dL (ref 70–99)

## 2024-03-10 LAB — BLOOD GAS, ARTERIAL
Acid-Base Excess: 1.2 mmol/L (ref 0.0–2.0)
Bicarbonate: 26.6 mmol/L (ref 20.0–28.0)
FIO2: 50 %
MECHVT: 480 mL
Mechanical Rate: 22
O2 Saturation: 99.8 %
PEEP: 8 cmH2O
Patient temperature: 37
pCO2 arterial: 44 mmHg (ref 32–48)
pH, Arterial: 7.39 (ref 7.35–7.45)
pO2, Arterial: 150 mmHg — ABNORMAL HIGH (ref 83–108)

## 2024-03-10 LAB — MAGNESIUM: Magnesium: 2.3 mg/dL (ref 1.7–2.4)

## 2024-03-10 LAB — COOXEMETRY PANEL
Carboxyhemoglobin: 1.3 % (ref 0.5–1.5)
Methemoglobin: 1 % (ref 0.0–1.5)
O2 Saturation: 89.6 %
Total hemoglobin: 11.2 g/dL — ABNORMAL LOW (ref 12.0–16.0)
Total oxygen content: 87.5 %

## 2024-03-10 LAB — HEPARIN LEVEL (UNFRACTIONATED): Heparin Unfractionated: 0.32 [IU]/mL (ref 0.30–0.70)

## 2024-03-10 MED ORDER — SODIUM CHLORIDE 0.9% FLUSH
10.0000 mL | Freq: Two times a day (BID) | INTRAVENOUS | Status: DC
  Administered 2024-03-10: 10 mL
  Administered 2024-03-10: 30 mL
  Administered 2024-03-11: 10 mL
  Administered 2024-03-11: 30 mL
  Administered 2024-03-12: 15 mL
  Administered 2024-03-12 – 2024-03-15 (×6): 10 mL

## 2024-03-10 MED ORDER — SODIUM CHLORIDE 0.9% FLUSH: 10.0000 mL | INTRAVENOUS | Status: DC | PRN

## 2024-03-10 MED ORDER — NOREPINEPHRINE 16 MG/250ML-% IV SOLN
0.0000 ug/min | INTRAVENOUS | Status: DC
  Administered 2024-03-10: 7 ug/min via INTRAVENOUS
  Administered 2024-03-11: 11 ug/min via INTRAVENOUS
  Administered 2024-03-13: 19 ug/min via INTRAVENOUS
  Administered 2024-03-14: 8 ug/min via INTRAVENOUS
  Administered 2024-03-15: 26 ug/min via INTRAVENOUS
  Administered 2024-03-15: 17 ug/min via INTRAVENOUS
  Administered 2024-03-16: 9 ug/min via INTRAVENOUS
  Administered 2024-03-18: 8 ug/min via INTRAVENOUS
  Administered 2024-03-19: 2 ug/min via INTRAVENOUS
  Administered 2024-03-20 (×3): 4 ug/min via INTRAVENOUS
  Administered 2024-03-21: 17 ug/min via INTRAVENOUS
  Administered 2024-03-21: 10 ug/min via INTRAVENOUS
  Administered 2024-03-22: 12 ug/min via INTRAVENOUS
  Administered 2024-03-23: 3 ug/min via INTRAVENOUS
  Filled 2024-03-10 (×12): qty 250

## 2024-03-10 NOTE — Plan of Care (Signed)
  Problem: Clinical Measurements: Goal: Ability to maintain clinical measurements within normal limits will improve Outcome: Not Progressing Goal: Will remain free from infection Outcome: Progressing Goal: Diagnostic test results will improve Outcome: Progressing Goal: Respiratory complications will improve Outcome: Not Progressing Goal: Cardiovascular complication will be avoided Outcome: Not Progressing   Problem: Nutrition: Goal: Adequate nutrition will be maintained Outcome: Not Progressing   Problem: Coping: Goal: Level of anxiety will decrease Outcome: Not Progressing   Problem: Elimination: Goal: Will not experience complications related to bowel motility Outcome: Not Progressing Goal: Will not experience complications related to urinary retention Outcome: Progressing   Problem: Pain Managment: Goal: General experience of comfort will improve and/or be controlled Outcome: Progressing   Problem: Skin Integrity: Goal: Risk for impaired skin integrity will decrease Outcome: Not Progressing   Problem: Fluid Volume: Goal: Ability to maintain a balanced intake and output will improve Outcome: Progressing   Problem: Metabolic: Goal: Ability to maintain appropriate glucose levels will improve Outcome: Progressing   Problem: Nutritional: Goal: Maintenance of adequate nutrition will improve Outcome: Not Progressing Goal: Progress toward achieving an optimal weight will improve Outcome: Progressing

## 2024-03-10 NOTE — Progress Notes (Signed)
 NAME:  Dave Brown, MRN:  [id number], DOB:  07/20/1945, LOS: 2 ADMISSION DATE:  03/08/2024, CONSULTATION DATE:  03/08/2024 REFERRING MD:  Dr. Evonnie, CHIEF COMPLAINT:  Shortness of breath, lower extremity edema   Brief Pt Description / Synopsis:  78 y.o. male with PMHx significant for HFpEF and COPD requiring 2L supplemental oxygen  who is transferred from [facility] with Acute on Chronic Hypoxic Respiratory Failure in the setting of Acute Decompensated HFpEF, circulatory shock, and AKI requiring initiation of CRRT.   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 on 03/06/24 with shortness of breath x 1 day along with mild increase in his lower extremity edema and orthopnea. He denied any fevers, chills, chest pain.    Of note, the patient was recently admitted to the hospital from 02/01/2024 to 02/05/2024 for respiratory failure due to CHF.  The patient did not require oxygen  at the time of his discharge.  His discharge weight was 286.  At his last office visit with his primary provider his weight was 291 pounds on 02/22/24.  Follow up with pulmonary on 03/04/24 office weight 29.   He was discharged home with furosemide  40 mg daily during last hospitalization  ED Course: Initial Vital Signs: afebrile and hemodynamically stable. Oxygen  saturation was 93-97% on 4 L  Significant Labs: WBC 11.0, hemoglobin 12.3, platelets 138. Sodium 141, potassium 4.1, bicarbonate 31, serum creatinine 4.15. proBNP 16,139. PCT 0.13. Lactic acid 1.9 >> 0.8. COVID-negative  Imaging Chest X-ray>>patchy opacities right lower lobe and left lower lobe  Medications Administered: furosemide  40 mg IV, Solu-Medrol  125 mg, ceftriaxone, and azithromycin. He was given albuterol  and Atrovent.   [facility] asked to admit for further workup and treatment.   He gradually became hypotensive requiring initiation of low dose Levophed (3 to 5 mcg), PCCM was consulted.  Nephrology was consulted for oliguria. Case was discussed with nephrology and it was felt the patient need to be initiated on CRRT. Subsequently, initial request was made to transfer the patient to [facility]. In order to expedite care, he is being transferred to [facility] regional ICU to critical care service where nephrology will be consulted for continued management of his renal failure and fluid overload.    Pertinent  Medical History   Past Medical History:  Diagnosis Date   CHF (congestive heart failure) (HCC)    Degenerative joint disease    High cholesterol    Hypertension    Kidney stones    Obesity    Respiratory failure with hypoxia (HCC)    Sleep apnea    Micro Data:  11/12: COVID/FLU/RSV PCR>> negative 11/12: Blood cultures>> no growth to date 11/13: RVP >> negative  11/13: MRSA PCR>> negative   Antimicrobials:   Anti-infectives (From admission, onward)    None      Significant Hospital Events: Including procedures, antibiotic start and stop dates in addition to other pertinent events   11/12: Admitted to [facility] to [place] for treatment of Acute Decompensated HFpEF and AKI. 11/13: BP decreased, started on Levophed.  PCCM consulted.  ABX discontinued. 11/14: Transfer to [facility] for initiation of CRRT.  11/15: seizure like episode, intubated 11/16: Unable to wean; PICC line placed  Interim History / Subjective:  Remains intubated and sedated. Unable to wean off the vent. PICC placed.  CT head negative  Objective   Blood pressure 126/65, pulse (!) 58, temperature 98.3 F (36.8 C), resp. rate (!) 22, height 5' 9 (1.753 m), weight (!) 139.8 kg, SpO2 99%.    Vent Mode: PRVC FiO2 (%):  [50 %-60 %] 50 % Set Rate:  [22 bmp] 22 bmp Vt Set:  [480 mL] 480 mL PEEP:  [8 cmH20] 8 cmH20 Plateau Pressure:  [21 cmH20] 21 cmH20   Intake/Output Summary (Last 24 hours)  at 03/10/2024 1409 Last data filed at 03/10/2024 1300 Gross per 24 hour  Intake 2798.65 ml  Output 2791 ml  Net 7.65 ml    Filed Weights   03/08/24 1842 03/09/24 0315 03/10/24 0545  Weight: (!) 144.1 kg 131.8 kg (!) 139.8 kg   Physical Examination: GEN: Critically ill patient, sedated HEENT: [place]/AT. PERRL, sclerae anicteric, ETT in place, neck is short, unable to assess JVD HEART: regular rhythm, bradycardia with HR between 54-60, S1, S2, no M/R/G,  LUNGS: CTAB, mild crackles without wheezes, synchronous on the vent  EXTREMITIES: +3 pitting edema NEURO: sedated, +gag, pupils equal and reactive, withdraws to pain, but no purposeful mnovements PSYCH: unable to assess ABDOMINAL: Profound central obesity, Soft: BS x 4 SKIN: Intact, warm, no rashes lesion, or ulcer  Resolved Hospital Problem list     Assessment & Plan:   #Acute Decompensated HFpEF-still volume overloaded-last 2D echo showed a left ventricular ejection fraction of 60 to 65% and chest x-ray with bilateral pleural effusions; proBNP greater than 1600 #Shock: Cardiogenic vs Septic  #Elevated Troponin, suspect demand ischemia PMHx: HTN  -Cardiology following, appreciate recommendations -Continue cardiovascular support with vasopressors as needed; titrate to maintain mean arterial blood pressure 65 and above -Hold GDMT  -HD/CRRT for volume removal -Cardiology consult - Continue heparin infusion and APTT monitoring  #Acute on Chronic Hypoxic Respiratory  #Pulmonary Edema #COPD without acute exacerbation  PMHx: COPD on 2L supplemental O2 - Continue full vent support with current settings -Weaning trials as tolerated - Chest x-ray and ABG as needed-Bronchodilators prn - CRRT for further volume removal  - CVP every 4 hours and Co. oximetry panel daily -Nebulized bronchodilators  #Questionable Pneumonia ~ low suspicion-no signs of infection so far -F/u cultures, trend lactic/ PCT -Monitor WBC/ fever curve -Obtain  MRSA nasal swab, RVP, COVID neg -Pressors PRN for MAP goal >65  #AKI on CKD Stage IV-creatinine improving from 3.92-2.98 with CRRT -Monitor I&O's / urinary output -Follow BMP - Continue vasopressors and titrate to maintain mean arterial blood pressure greater than 65 -Avoid nephrotoxic agents as able -Replace electrolytes as indicated ~ Pharmacy following for assistance with electrolyte replacement -Nephrology following, appreciate recommendations  #Diabetes Mellitus  -CBG's q4h; Target range of 140 to 180 -SSI -Follow ICU Hypo/Hyperglycemia protocol  Best Practice   Diet/type: NPO DVT prophylaxis: SCDs and subcu heparin GI prophylaxis: PPI Lines: Central line and Dialysis Catheter Foley:  Yes, and it is still needed Code Status:  full code Last date of multidisciplinary goals of care discussion [03/10/24]  11/16: Awaiting patient's daughter to come in.  If she does not come in before the end of the shift will update by phone.  Labs   CBC: Recent Labs  Lab 03/07/24 0442 03/08/24 0311 03/08/24 1932 03/09/24 0435 03/10/24 0441  WBC 10.5 12.7* 17.1* 16.2* 9.7  NEUTROABS  --   --  15.3*  --   --   HGB 11.7* 11.7* 12.0* 11.6* 10.7*  HCT 39.3 38.6* 39.8 38.6* 33.0*  MCV 109.5* 107.2* 107.6* 106.9* 100.0  PLT  121* 139* 179 153 86*    Basic Metabolic Panel: Recent Labs  Lab 03/07/24 0442 03/08/24 0311 03/08/24 1932 03/09/24 0435 03/09/24 1757 03/10/24 0441  NA 141 136 133* 129* 131* 133*  K 4.1 4.8 5.4* 4.8 4.8 4.6  CL 101 97* 94* 94* 101 101  CO2 26 29 27 26  21* 23  GLUCOSE 70 141* 142* 226* 131* 150*  BUN 51* 64* 74* 60* 46* 33*  CREATININE 4.58* 5.86* 6.62* 5.53* 3.92* 2.98*  CALCIUM  8.8* 8.9 9.1 8.1* 8.0* 8.2*  MG 2.6*  --  2.7* 2.5*  --  2.3  PHOS 5.7* 7.3* 8.6* 6.6*  6.5* 3.5 3.7   GFR: Estimated Creatinine Clearance: 28.4 mL/min (A) (by C-G formula based on SCr of 2.98 mg/dL (H)). Recent Labs  Lab 03/06/24 2129 03/06/24 2255 03/07/24 0442  03/08/24 0311 03/08/24 1932 03/09/24 0435 03/09/24 0840 03/10/24 0441  PROCALCITON  --   --  0.13  --  0.47  --   --   --   WBC 11.0*  --  10.5 12.7* 17.1* 16.2*  --  9.7  LATICACIDVEN 1.0 0.8  --   --  0.8  --  1.2  --     Liver Function Tests: Recent Labs  Lab 03/06/24 2129 03/07/24 0442 03/08/24 0311 03/08/24 1932 03/09/24 0435 03/09/24 1757 03/10/24 0441  AST 20 23  --  23  --   --   --   ALT 18 18  --  20  --   --   --   ALKPHOS 91 88  --  90  --   --   --   BILITOT 0.3 0.3  --  0.4  --   --   --   PROT 6.7 6.7  --  7.0  --   --   --   ALBUMIN 3.7 3.7 3.8 3.9 3.6 3.4* 3.1*   No results for input(s): LIPASE, AMYLASE in the last 168 hours. No results for input(s): AMMONIA in the last 168 hours.  ABG    Component Value Date/Time   PHART 7.39 03/10/2024 0745   PCO2ART 44 03/10/2024 0745   PO2ART 150 (H) 03/10/2024 0745   HCO3 26.6 03/10/2024 0745   ACIDBASEDEF 7.0 (H) 03/09/2024 1534   O2SAT 99.8 03/10/2024 0745     Coagulation Profile: Recent Labs  Lab 03/08/24 1932  INR 1.0    Cardiac Enzymes: No results for input(s): CKTOTAL, CKMB, CKMBINDEX, TROPONINI in the last 168 hours.  HbA1C: Hgb A1c MFr Bld  Date/Time Value Ref Range Status  03/08/2024 03:11 AM 5.2 4.8 - 5.6 % Final    Comment:    (NOTE) Diagnosis of Diabetes The following HbA1c ranges recommended by the American Diabetes Association (ADA) may be used as an aid in the diagnosis of diabetes mellitus.  Hemoglobin             Suggested A1C NGSP%              Diagnosis  <5.7                   Non Diabetic  5.7-6.4                Pre-Diabetic  >6.4                   Diabetic  <7.0                   Glycemic control for  adults with diabetes.    05/02/2020 02:37 PM 6.9 (H) 4.8 - 5.6 % Final    Comment:    (NOTE) Pre diabetes:          5.7%-6.4%  Diabetes:              >6.4%  Glycemic control for   <7.0% adults with diabetes      CBG: Recent Labs  Lab 03/09/24 1943 03/09/24 2351 03/10/24 0407 03/10/24 0759 03/10/24 1201  GLUCAP 129* 124* 144* 136* 144*    Review of Systems:   Positives in BOLD: Gen: Denies fever, chills, weight change, fatigue, night sweats HEENT: Denies blurred vision, double vision, hearing loss, tinnitus, sinus congestion, rhinorrhea, sore throat, neck stiffness, dysphagia PULM: Denies shortness of breath, cough, sputum production, hemoptysis, wheezing CV: Denies chest pain, edema, orthopnea, paroxysmal nocturnal dyspnea, palpitations GI: Denies abdominal pain, nausea, vomiting, diarrhea, hematochezia, melena, constipation, change in bowel habits GU: Denies dysuria, hematuria, polyuria, oliguria, urethral discharge Endocrine: Denies hot or cold intolerance, polyuria, polyphagia or appetite change Derm: Denies rash, dry skin, scaling or peeling skin change Heme: Denies easy bruising, bleeding, bleeding gums Neuro: Denies headache, numbness, weakness, slurred speech, loss of memory or consciousness  Past Medical History:  He,  has a past medical history of CHF (congestive heart failure) (HCC), Degenerative joint disease, High cholesterol, Hypertension, Kidney stones, Obesity, Respiratory failure with hypoxia (HCC), and Sleep apnea.   Surgical History:  No past surgical history on file.   Social History:   reports that he quit smoking about 25 years ago. His smoking use included cigars. He does not have any smokeless tobacco history on file. He reports that he does not drink alcohol and does not use drugs.   Family History:  His family history is not on file.   Allergies No Known Allergies   Home Medications  Prior to Admission medications   Medication Sig Start Date End Date Taking? Authorizing Provider  albuterol  (PROVENTIL ) (2.5 MG/3ML) 0.083% nebulizer solution Take 2.5 mg by nebulization every 6 (six) hours as needed for shortness of breath or wheezing. 03/04/24  Yes  [provider]  budesonide-formoterol (SYMBICORT) 160-4.5 MCG/ACT inhaler Inhale 2 puffs into the lungs in the morning and at bedtime. 03/04/24 03/04/25 Yes [provider]  furosemide  (LASIX ) 40 MG tablet Take 1 tablet (40 mg total) by mouth daily. PT. MUST MAKE AN APPOINTMENT IN ORDER TO RECEIVE FUTURE REFILLS. FIRST ATTEMPT. 09/20/23  Yes [name], [name], MD  glimepiride (AMARYL) 1 MG tablet Take 1 mg by mouth daily with breakfast.   Yes [provider]  lisinopril (ZESTRIL) 10 MG tablet Take 10 mg by mouth daily. 01/03/22  Yes [provider]  metoprolol  succinate (TOPROL -XL) 50 MG 24 hr tablet Take 50 mg by mouth in the morning. 05/27/12  Yes [provider]  rosuvastatin  (CRESTOR ) 10 MG tablet Take 10 mg by mouth daily.   Yes [provider]  sertraline  (ZOLOFT ) 25 MG tablet Take 25 mg by mouth in the morning.   Yes [provider]  TYLENOL  325 MG tablet Take 325-650 mg by mouth every 8 (eight) hours as needed for mild pain (pain score 1-3) (or headaches).   Yes [provider]  hydrocortisone sodium succinate (SOLU-CORTEF) 100 MG injection Inject 2 mLs (100 mg total) into the vein every 8 (eight) hours. 03/08/24   Evonnie Lenis, MD  midodrine (PROAMATINE) 10 MG tablet Take 1 tablet (10 mg total) by mouth 3 (three) times daily  with meals. 03/08/24   Evonnie Lenis, MD  norepinephrine (LEVOPHED) 4-5 MG/250ML-% SOLN Inject 0-10 mcg/min into the vein continuous. 03/08/24   Evonnie Lenis, MD  Scheduled Meds:  Chlorhexidine Gluconate Cloth  6 each Topical Daily   insulin  aspart  0-6 Units Subcutaneous Q4H   ipratropium-albuterol   3 mL Nebulization Q6H   multivitamin  1 tablet Per Tube QHS   mouth rinse  15 mL Mouth Rinse Q2H   pantoprazole  (PROTONIX ) IV  40 mg Intravenous QHS   Continuous Infusions:  dexmedetomidine (PRECEDEX) IV infusion Stopped (03/09/24 1518)   heparin 1,100 Units/hr (03/10/24 1300)   norepinephrine (LEVOPHED)  Adult infusion 7 mcg/min (03/10/24 1300)   prismasol BGK 4/2.5 400 mL/hr at 03/10/24 0050   prismasol BGK 4/2.5 400 mL/hr at 03/10/24 0049   prismasol BGK 4/2.5 2,500 mL/hr at 03/10/24 1249   propofol (DIPRIVAN) infusion 50 mcg/kg/min (03/10/24 1300)   vasopressin 0.04 Units/min (03/10/24 1300)   PRN Meds:.docusate sodium, heparin, ipratropium-albuterol , mouth rinse, polyethylene glycol   Critical care time: 45 minutes   Tawnee Clegg S. Griffin, PhD, MSN, MPH, ANP-BC [facility] Prefer epic messenger for cross cover needs If after hours, please call E-link  NB: This document was prepared using Dragon voice recognition software and may include unintentional dictation errors.

## 2024-03-10 NOTE — Consult Note (Addendum)
 NEUROLOGY CONSULT NOTE   Date of service: March 10, 2024 Patient Name: Dave Brown MRN:  [id number] DOB:  1946-03-23 Chief Complaint: Seizure-like activity Requesting Provider: Isadora Hose, MD  History of Present Illness  Dave Brown is a 78 y.o. male with hx of heart failure with preserved ejection fraction, hypertension, hyperlipidemia, CKD 4, OSA, diabetes, OSA, COPD, morbid obesity MGUS, chronic respiratory failure on 2 L at home presented to [facility] on 03/07/2023 with shortness of breath for a day along with mild increase in his lower extremity edema and orthopnea.  He was admitted recently 02/01/2024 to 02/05/2024 for respiratory failure due to CHF.  Did not require oxygen  at the time of discharge.  He was seen in follow-up and put on Lasix .  Upon this admission, he gradually became hypotensive requiring low-dose pressors and eventually critical care and nephrology were consulted because he was also having oliguria. Nephrology felt that the patient needed to be on CRRT and subsequently transferred to [facility] due to no beds available at [facility] so that he could be provided the critical services that he needed from his fluid overload standpoint. Neurology was consulted for about a 5 to 10-second episode of seizure-like activity noted yesterday.  No seizure-like activity before or after.  No history of seizures.   ROS  Unable to ascertain due to his mentation  Past History   Past Medical History:  Diagnosis Date   CHF (congestive heart failure) (HCC)    Degenerative joint disease    High cholesterol    Hypertension    Kidney stones    Obesity    Respiratory failure with hypoxia (HCC)    Sleep apnea     No past surgical history on file.  Family History: No family history on file.  Social History  reports that he quit smoking about 25 years ago. His smoking use included cigars. He does not have any smokeless tobacco history on file. He reports that he  does not drink alcohol and does not use drugs.  No Known Allergies  Medications   Current Facility-Administered Medications:    Chlorhexidine Gluconate Cloth 2 % PADS 6 each, 6 each, Topical, Daily, Ouma, Almarie Bake, NP, 6 each at 03/09/24 1000   dexmedetomidine (PRECEDEX) 400 MCG/100ML (4 mcg/mL) infusion, 0-1.2 mcg/kg/hr, Intravenous, Titrated, Ouma, Almarie Bake, NP, Stopped at 03/09/24 1518   docusate sodium (COLACE) capsule 100 mg, 100 mg, Oral, BID PRN, Ouma, Elizabeth Achieng, NP   heparin ADULT infusion 100 units/mL (25000 units/250mL), 1,100 Units/hr, Intravenous, Continuous, Horacio Mardy SAILOR, RXTECH, Last Rate: 11 mL/hr at 03/10/24 0700, 1,100 Units/hr at 03/10/24 0700   heparin injection 1,000-6,000 Units, 1,000-6,000 Units, CRRT, PRN, Lateef, Munsoor, MD   insulin  aspart (novoLOG ) injection 0-6 Units, 0-6 Units, Subcutaneous, Q4H, Ouma, Almarie Bake, NP   ipratropium-albuterol  (DUONEB) 0.5-2.5 (3) MG/3ML nebulizer solution 3 mL, 3 mL, Nebulization, Q6H, Ouma, Almarie Bake, NP, 3 mL at 03/10/24 0738   ipratropium-albuterol  (DUONEB) 0.5-2.5 (3) MG/3ML nebulizer solution 3 mL, 3 mL, Nebulization, Q6H PRN, Ouma, Elizabeth Achieng, NP   multivitamin (RENA-VIT) tablet 1 tablet, 1 tablet, Per Tube, QHS, Dgayli, Khabib, MD, 1 tablet at 03/09/24 2117   norepinephrine (LEVOPHED) 4mg  in (0.016 mg/mL) premix infusion, 0-40 mcg/min, Intravenous, Titrated, Ouma, Almarie Bake, NP, Last Rate: 30 mL/hr at 03/10/24 0700, 8 mcg/min at 03/10/24 0700   Oral care mouth rinse, 15 mL, Mouth Rinse, Q2H, Dgayli, Khabib, MD, 15 mL at 03/10/24 0746   Oral care mouth rinse,  15 mL, Mouth Rinse, PRN, Dgayli, Khabib, MD   pantoprazole  (PROTONIX ) injection 40 mg, 40 mg, Intravenous, QHS, Dgayli, Khabib, MD, 40 mg at 03/09/24 2104   polyethylene glycol (MIRALAX / GLYCOLAX) packet 17 g, 17 g, Per Tube, Daily PRN, Isadora Hose, MD   prismasol BGK 4/2.5 infusion, , CRRT, Continuous,  Lateef, Munsoor, MD, Last Rate: 400 mL/hr at 03/10/24 0050, New Bag at 03/10/24 0050   prismasol BGK 4/2.5 infusion, , CRRT, Continuous, Lateef, Munsoor, MD, Last Rate: 400 mL/hr at 03/10/24 0049, New Bag at 03/10/24 0049   prismasol BGK 4/2.5 infusion, , CRRT, Continuous, Lateef, Munsoor, MD, Last Rate: 2,500 mL/hr at 03/10/24 0635, New Bag at 03/10/24 0635   propofol (DIPRIVAN) 1000 MG/100ML infusion, 0-80 mcg/kg/min, Intravenous, Titrated, Benjamin, Alzora L, NP, Last Rate: 31.6 mL/hr at 03/10/24 0721, 40 mcg/kg/min at 03/10/24 0721   vasopressin (PITRESSIN) 20 Units in 100 mL (0.2 unit/mL) infusion-*FOR SHOCK*, 0-0.03 Units/min, Intravenous, Continuous, Benjamin, Alzora L, NP, Last Rate: 6 mL/hr at 03/10/24 0700, 0.02 Units/min at 03/10/24 0700  Vitals   Vitals:   03/10/24 0630 03/10/24 0645 03/10/24 0700 03/10/24 0715  BP:      Pulse: (!) 55 (!) 56 (!) 55 (!) 57  Resp: (!) 22 (!) 22 (!) 22 (!) 22  Temp:      TempSrc:      SpO2: 98% 98% 98% 98%  Weight:      Height:        Body mass index is 45.51 kg/m.   Physical Exam   General: Sedated intubated.  Sedation with propofol held for exam HEENT: Normocephalic atraumatic Lungs: Clear Cardiovascular: Regular rate rhythm on the monitor Extremities: Pitting edema bilaterally Neurological exam He has been sedated overnight with propofol, which was held for the exam He is waking up some with lifting of sedation. To loud noise, he has no response To noxious stimulation, he initially grimaces but as the sedation wore off, he started to localize but stronger with the right upper extremity and withdrew the lower extremities equally.  The left upper extremity was somewhat weaker but I am not sure if that is still the effect of sedation. Pupils are equal round reactive to light, corneal reflexes are present, he is breathing over the ventilator.   Labs/Imaging/Neurodiagnostic studies   CBC:  Recent Labs  Lab 03-26-24 1932  03/09/24 0435 03/10/24 0441  WBC 17.1* 16.2* 9.7  NEUTROABS 15.3*  --   --   HGB 12.0* 11.6* 10.7*  HCT 39.8 38.6* 33.0*  MCV 107.6* 106.9* 100.0  PLT 179 153 86*   Basic Metabolic Panel:  Lab Results  Component Value Date   NA 133 (L) 03/10/2024   K 4.6 03/10/2024   CO2 23 03/10/2024   GLUCOSE 150 (H) 03/10/2024   BUN 33 (H) 03/10/2024   CREATININE 2.98 (H) 03/10/2024   CALCIUM  8.2 (L) 03/10/2024   GFRNONAA 21 (L) 03/10/2024   HgbA1c:  Lab Results  Component Value Date   HGBA1C 5.2 03/26/24   INR  Lab Results  Component Value Date   INR 1.0 03-26-24   APTT  Lab Results  Component Value Date   APTT 29 March 26, 2024   Imaging personally reviewed Chest x-ray: ET tube and enteric tube in place.  Low lung volumes with bilateral pleural effusions and mild bibasilar atelectatic changes  ASSESSMENT   Dave Brown is a 78 y.o. male past history of heart failure with preserved ejection fraction, hypertension, bulimia, CKD 4, OSA, diabetes, COPD, morbid  obesity, MGUS, chronic respiratory failure on 2 L at home presented to [facility] with shortness of breath and increasing lower extremity edema after recent discharge a month ago for CHF exacerbation causing respiratory failure.  Gradually started to become hypotensive requiring initiation of vasopressors and critical care and nephrology were involved.  Transferred to [facility] for timely CRRT since no beds were available at [facility].  Witnessed to have a few seconds of seizure-like activity. I suspect that the activity might have been adventitious movements in the setting of toxic metabolic encephalopathy but given the multiple metabolic derangements, it is reasonable to obtain a routine EEG tomorrow.  The rapid EEG is really not going to be very sensitive to picking up nuances so I really do not think it is beneficial. At some point he should get a head imaging done once stable from a medical perspective  to rule out a bleed  Impression:  Seizure-like activity, likely due to toxic metabolic encephalopathy in the setting of acute hypoxic respiratory failure and acute on chronic heart failure as well as AKI on CKD and cardiogenic shock Rule out stroke  RECOMMENDATIONS  Minimize sedation with propofol Use fentanyl  as needed for vent synchrony If continue sedation as needed, try Precedex Continue doing frequent neurochecks At this time, he does not seem to be in status epilepticus so a rapid EEG is not really likely to be of much use. Routine EEG can be obtained tomorrow His likely presentation is from toxic metabolic encephalopathy in the setting of acute decompensation of his CKD with AKI, pleural effusions, heart failure. If the routine EEG is concerning, may need consideration for transfer to [facility] for LTM EEG He has had no brain imaging-I would recommend obtaining a head CT when he is stable from a medical standpoint.  This is to rule out a large ischemic infarct or bleed. This plan was discussed with Dr. Isadora, ICU attending ______________________________________________________________________   Addendum 3 PM CT head was done earlier this morning.  No acute findings.  Recommendations as above.   Signed, Eligio Lav, MD [facility]   CRITICAL CARE ATTESTATION Performed by: Eligio Lav, MD Total critical care time: 48 minutes Critical care time was exclusive of separately billable procedures and treating other patients and/or supervising APPs/Residents/Students Critical care was necessary to treat or prevent imminent or life-threatening deterioration. This patient is critically ill and at significant risk for neurological worsening and/or death and care requires constant monitoring. Critical care was time spent personally by me on the following activities: development of treatment plan with patient and/or surrogate as well as nursing, discussions with  consultants, evaluation of patient's response to treatment, examination of patient, obtaining history from patient or surrogate, ordering and performing treatments and interventions, ordering and review of laboratory studies, ordering and review of radiographic studies, pulse oximetry, re-evaluation of patient's condition, participation in multidisciplinary rounds and medical decision making of high complexity in the care of this patient.

## 2024-03-10 NOTE — Consult Note (Signed)
 PHARMACY - ANTICOAGULATION CONSULT NOTE  Pharmacy Consult for Heparin infusion Indication: CRRT clot prevention  No Known Allergies  Patient Measurements: Height: 5' 9 (175.3 cm) Weight: 131.8 kg (290 lb 9.1 oz) IBW/kg (Calculated) : 70.7  Vital Signs: Temp: 98 F (36.7 C) (11/16 0400) Temp Source: Axillary (11/16 0400) BP: 118/66 (11/15 1930) Pulse Rate: 63 (11/16 0500)  Labs: Recent Labs    03/08/24 0311 03/08/24 1932 03/09/24 0435 03/09/24 1757 03/09/24 2159 03/10/24 0441  HGB 11.7* 12.0* 11.6*  --   --   --   HCT 38.6* 39.8 38.6*  --   --   --   PLT 139* 179 153  --   --   --   APTT  --  29  --   --   --   --   LABPROT  --  14.3  --   --   --   --   INR  --  1.0  --   --   --   --   HEPARINUNFRC  --   --   --   --  0.32 0.32  CREATININE 5.86* 6.62* 5.53* 3.92*  --  2.98*    Estimated Creatinine Clearance: 27.5 mL/min (A) (by C-G formula based on SCr of 2.98 mg/dL (H)).   Medical History: Past Medical History:  Diagnosis Date   CHF (congestive heart failure) (HCC)    Degenerative joint disease    High cholesterol    Hypertension    Kidney stones    Obesity    Respiratory failure with hypoxia (HCC)    Sleep apnea     Medications:  No anticoagulation PTA  Assessment: Patient is a 78 y/o male with a PMH significant for HFpEF, COPD, and acute on chronic hypoxic respiratory failure, currently with AKI requiring CRRT. Despite heparin lock flushes, the CRRT circuit has continued to clot. Pharmacy is consulted for initiation and management of a heparin infusion to prevent further clotting during CRRT.  Baseline labs WNL: Hbg 11.6, PLT 153, INR 1.0, aPTT 29  Goal of Therapy:  Heparin level 0.3-0.7 units/ml Monitor platelets by anticoagulation protocol: Yes  11/15 2159 HL 0.32, therapeutic x 1 11/16 0441 HL 0.32, therapeutic x 2  Plan:  Per MD: no heparin bolus Continue heparin infusion at 1,100 units/hr Recheck HL daily w/ AM labs while  therapeutic CBC daily while on heparin  Rankin CANDIE Dills, PharmD, [facility] 03/10/2024 5:05 AM

## 2024-03-10 NOTE — Care Management Important Message (Signed)
 Important Message  Patient Details  Name: Dave Brown MRN: [id number] Date of Birth: [birth year]   Important Message Given:  Yes - Medicare IM     Rojelio SHAUNNA Rattler 03/10/2024, 6:02 PM

## 2024-03-10 NOTE — Plan of Care (Signed)
 Patient's daughter updated on current plan of care. All questions answered

## 2024-03-10 NOTE — Plan of Care (Signed)
  Problem: Clinical Measurements: Goal: Diagnostic test results will improve Outcome: Progressing Goal: Respiratory complications will improve Outcome: Progressing Goal: Cardiovascular complication will be avoided Outcome: Progressing   Problem: Elimination: Goal: Will not experience complications related to urinary retention Outcome: Progressing   Problem: Pain Managment: Goal: General experience of comfort will improve and/or be controlled Outcome: Progressing   Problem: Safety: Goal: Ability to remain free from injury will improve Outcome: Progressing   Problem: Skin Integrity: Goal: Risk for impaired skin integrity will decrease Outcome: Progressing   Problem: Fluid Volume: Goal: Ability to maintain a balanced intake and output will improve Outcome: Progressing   Problem: Tissue Perfusion: Goal: Adequacy of tissue perfusion will improve Outcome: Progressing

## 2024-03-10 NOTE — Plan of Care (Signed)
  Problem: Education: Goal: Knowledge of General Education information will improve Description: Including pain rating scale, medication(s)/side effects and non-pharmacologic comfort measures Outcome: Not Progressing   Problem: Clinical Measurements: Goal: Ability to maintain clinical measurements within normal limits will improve Outcome: Progressing Goal: Diagnostic test results will improve Outcome: Progressing Goal: Respiratory complications will improve Outcome: Not Progressing Goal: Cardiovascular complication will be avoided Outcome: Progressing   Problem: Nutrition: Goal: Adequate nutrition will be maintained Outcome: Not Progressing   Problem: Elimination: Goal: Will not experience complications related to bowel motility Outcome: Not Progressing Goal: Will not experience complications related to urinary retention Outcome: Not Progressing   Problem: Pain Managment: Goal: General experience of comfort will improve and/or be controlled Outcome: Progressing   Problem: Safety: Goal: Ability to remain free from injury will improve Outcome: Progressing   Problem: Skin Integrity: Goal: Risk for impaired skin integrity will decrease Outcome: Not Progressing

## 2024-03-10 NOTE — Consult Note (Signed)
 PHARMACY CONSULT NOTE - ELECTROLYTES  Pharmacy Consult for Electrolyte Monitoring and Replacement   Recent Labs: Height: 5' 9 (175.3 cm) Weight: (!) 139.8 kg (308 lb 3.3 oz) IBW/kg (Calculated) : 70.7 Estimated Creatinine Clearance: 28.4 mL/min (A) (by C-G formula based on SCr of 2.98 mg/dL (H)). Potassium  Date Value  03/10/2024 4.6 mmol/L  07/18/2013 3.4 mEq/L (L)   Magnesium (mg/dL)  Date Value  88/83/7974 2.3   Calcium  (mg/dL)  Date Value  88/83/7974 8.2 (L)  07/18/2013 10.1   Albumin (g/dL)  Date Value  88/83/7974 3.1 (L)  07/18/2013 3.8   Phosphorus (mg/dL)  Date Value  88/83/7974 3.7   Sodium  Date Value  03/10/2024 133 mmol/L (L)  07/18/2013 138 mEq/L    Assessment  Dave Brown is a 78 y.o. male presenting with AKI requiring CRRT. PMH significant for HFpEF and COPD requiring 2L supplemental oxygen  . Pharmacy has been consulted to monitor and replace electrolytes.  Diet: NPO MIVF: Primsasol CRRT fluid Pertinent medications: N/A  Goal of Therapy: Electrolytes WNL  Plan:  No replacement currently indicated BMP ordered twice daily while on CRRT Check Mg, Phos with AM labs  Thank you for allowing pharmacy to be a part of this patient's care.  Vyolet Sakuma A Sissi Padia, PharmD Clinical Pharmacist 03/10/2024 8:12 AM

## 2024-03-10 NOTE — Progress Notes (Signed)
   03/10/24 0800  Spiritual Encounters  Type of Visit Initial  Care provided to: Pt and family  Conversation partners present during encounter Nurse  Referral source Nurse (RN/NT/LPN)  Reason for visit Routine spiritual support  OnCall Visit Yes   Chaplain responded to request for family support. Chaplain provided compassionate presence and reflective listening as patient's daughter spoke of her father and his challenges. Chaplain services is available for folow up as needed.

## 2024-03-10 NOTE — Progress Notes (Signed)
 Peripherally Inserted Central Catheter Placement  The IV Nurse has discussed with the patient and/or persons authorized to consent for the patient, the purpose of this procedure and the potential benefits and risks involved with this procedure.  The benefits include less needle sticks, lab draws from the catheter, and the patient may be discharged home with the catheter. Risks include, but not limited to, infection, bleeding, blood clot (thrombus formation), and puncture of an artery; nerve damage and irregular heartbeat and possibility to perform a PICC exchange if needed/ordered by physician.  Alternatives to this procedure were also discussed.  Bard Power PICC patient education guide, fact sheet on infection prevention and patient information card has been provided to patient /or left at bedside. Telephone consent obtained from son.   PICC Placement Documentation  PICC Triple Lumen 03/10/24 Right Brachial 42 cm 2 cm (Active)  Indication for Insertion or Continuance of Line Vasoactive infusions 03/10/24 1415  Exposed Catheter (cm) 2 cm 03/10/24 1415  Site Assessment Clean, Dry, Intact 03/10/24 1415  Lumen #1 Status Flushed;Saline locked;Blood return noted 03/10/24 1415  Lumen #2 Status Flushed;Saline locked;Blood return noted 03/10/24 1415  Lumen #3 Status Flushed;Saline locked;Blood return noted 03/10/24 1415  Dressing Type Transparent;Securing device 03/10/24 1415  Dressing Status Antimicrobial disc/dressing in place;Clean, Dry, Intact 03/10/24 1415  Line Care Connections checked and tightened 03/10/24 1415  Line Adjustment (NICU/IV Team Only) No 03/10/24 1415  Dressing Intervention New dressing;Adhesive placed around edges of dressing (IV team/ICU RN only);Adhesive placed at insertion site (IV team only) 03/10/24 1415  Dressing Change Due 03/17/24 03/10/24 1415       Dave Brown 03/10/2024, 2:15 PM

## 2024-03-10 NOTE — Progress Notes (Signed)
 Progress Note  Patient Name: Dave Brown Date of Encounter: 03/10/2024  Primary Cardiologist: [place]  Subjective   Remains intubated, sedated, and on vasopressin and Levophed. Renal function improving with CRRT.  Inpatient Medications    Scheduled Meds:  Chlorhexidine Gluconate Cloth  6 each Topical Daily   insulin  aspart  0-6 Units Subcutaneous Q4H   ipratropium-albuterol   3 mL Nebulization Q6H   multivitamin  1 tablet Per Tube QHS   mouth rinse  15 mL Mouth Rinse Q2H   pantoprazole  (PROTONIX ) IV  40 mg Intravenous QHS   Continuous Infusions:  dexmedetomidine (PRECEDEX) IV infusion Stopped (03/09/24 1518)   heparin 1,100 Units/hr (03/10/24 0800)   norepinephrine (LEVOPHED) Adult infusion 7 mcg/min (03/10/24 0800)   prismasol BGK 4/2.5 400 mL/hr at 03/10/24 0050   prismasol BGK 4/2.5 400 mL/hr at 03/10/24 0049   prismasol BGK 4/2.5 2,500 mL/hr at 03/10/24 0635   propofol (DIPRIVAN) infusion 40 mcg/kg/min (03/10/24 0800)   vasopressin 0.04 Units/min (03/10/24 0800)   PRN Meds: docusate sodium, heparin, ipratropium-albuterol , mouth rinse, polyethylene glycol   Vital Signs    Vitals:   03/10/24 0645 03/10/24 0700 03/10/24 0715 03/10/24 0800  BP:      Pulse: (!) 56 (!) 55 (!) 57   Resp: (!) 22 (!) 22 (!) 22   Temp:    98.5 F (36.9 C)  TempSrc:    Axillary  SpO2: 98% 98% 98%   Weight:      Height:        Intake/Output Summary (Last 24 hours) at 03/10/2024 0835 Last data filed at 03/10/2024 0800 Gross per 24 hour  Intake 2795.79 ml  Output 2619 ml  Net 176.79 ml   Filed Weights   03/08/24 1842 03/09/24 0315 03/10/24 0545  Weight: (!) 144.1 kg 131.8 kg (!) 139.8 kg    Telemetry    Sinus bradycardia, 50s bpm, first degree AV block - Personally Reviewed  ECG    No new tracings - Personally Reviewed  Physical Exam   GEN: No acute distress.   Neck: JVD difficult to assess secondary to respiratory support apparatus. Cardiac: RRR, no murmurs, rubs,  or gallops.  Respiratory: Vented breath sounds bilaterally.  GI: Soft, nontender, non-distended.   MS: 1+ bilateral lower extremity edema; No deformity. Neuro:  Intubated and sedated.  Psych: Intubated and sedated.  Labs    Chemistry Recent Labs  Lab 03/06/24 2129 03/07/24 0442 03/08/24 0311 03/08/24 1932 03/09/24 0435 03/09/24 1757 03/10/24 0441  NA 141 141   < > 133* 129* 131* 133*  K 4.1 4.1   < > 5.4* 4.8 4.8 4.6  CL 102 101   < > 94* 94* 101 101  CO2 31 26   < > 27 26 21* 23  GLUCOSE 48* 70   < > 142* 226* 131* 150*  BUN 46* 51*   < > 74* 60* 46* 33*  CREATININE 4.15* 4.58*   < > 6.62* 5.53* 3.92* 2.98*  CALCIUM  9.0 8.8*   < > 9.1 8.1* 8.0* 8.2*  PROT 6.7 6.7  --  7.0  --   --   --   ALBUMIN 3.7 3.7   < > 3.9 3.6 3.4* 3.1*  AST 20 23  --  23  --   --   --   ALT 18 18  --  20  --   --   --   ALKPHOS 91 88  --  90  --   --   --  BILITOT 0.3 0.3  --  0.4  --   --   --   GFRNONAA 14* 12*   < > 8* 10* 15* 21*  ANIONGAP 8 13   < > 12 10 9 10    < > = values in this interval not displayed.     Hematology Recent Labs  Lab 03/08/24 1932 03/09/24 0435 03/10/24 0441  WBC 17.1* 16.2* 9.7  RBC 3.70* 3.61* 3.30*  HGB 12.0* 11.6* 10.7*  HCT 39.8 38.6* 33.0*  MCV 107.6* 106.9* 100.0  MCH 32.4 32.1 32.4  MCHC 30.2 30.1 32.4  RDW 14.1 13.9 13.6  PLT 179 153 86*    Cardiac EnzymesNo results for input(s): TROPONINI in the last 168 hours. No results for input(s): TROPIPOC in the last 168 hours.   BNP Recent Labs  Lab 03/06/24 2129  PROBNP 16,139.0*     DDimer No results for input(s): DDIMER in the last 168 hours.   Radiology    DG Abd Portable 1V Result Date: 03/09/2024 IMPRESSION: 1. Enteric catheter tip projecting over the gastric body. Electronically Signed   By: Ozell Daring M.D.   On: 03/09/2024 16:38   DG Chest Port 1 View Result Date: 03/09/2024 IMPRESSION: 1. Endotracheal tube and enteric tube positioning, as described above. 2. Low lung  volumes with small bilateral pleural effusions and mild bibasilar atelectatic changes. Electronically Signed   By: Suzen Dials M.D.   On: 03/09/2024 16:36    Cardiac Studies   Limited echo 03/07/2024: 1. Limited Echo to evaluate LV function.   2. No LV thrombus by Definity. Left ventricular ejection fraction, by  estimation, is 60 to 65%. The left ventricle has normal function. The left  ventricle has no regional wall motion abnormalities. There is severe left  ventricular hypertrophy of the  septal segment. Left ventricular diastolic parameters are consistent with  Grade III diastolic dysfunction (restrictive). Elevated left atrial  pressure.   3. Right ventricular systolic function is normal. The right ventricular  size is normal. Tricuspid regurgitation signal is inadequate for assessing  PA pressure.   4. Aortic dilatation noted. There is mild dilatation of the aortic root,  measuring 40 mm.   5. The inferior vena cava is dilated in size with <50% respiratory  variability, suggesting right atrial pressure of 15 mmHg.   Comparison(s): No significant change from prior study.  __________  2D echo 02/02/2024: 1. Left ventricular ejection fraction, by estimation, is 60 to 65%. The  left ventricle has normal function. The left ventricle has no regional  wall motion abnormalities. There is moderate concentric left ventricular  hypertrophy. Left ventricular  diastolic parameters are consistent with Grade III diastolic dysfunction  (restrictive). Elevated left ventricular end-diastolic pressure.   2. Right ventricular systolic function is normal. The right ventricular  size is normal. There is normal pulmonary artery systolic pressure.   3. Left atrial size was moderately dilated.   4. The mitral valve is normal in structure. Trivial mitral valve  regurgitation. No evidence of mitral stenosis.   5. The aortic valve is tricuspid. There is mild calcification of the  aortic valve.  Aortic valve regurgitation is not visualized. Aortic valve  sclerosis is present, with no evidence of aortic valve stenosis.   6. The inferior vena cava is dilated in size with <50% respiratory  variability, suggesting right atrial pressure of 15 mmHg.  __________  Echo 03/14/2022: Normal LV systolic function with visual EF 55-60%. Left ventricle cavity  is normal in  size. Moderate concentric hypertrophy of the left ventricle.  Normal global wall motion. Doppler evidence of grade I (impaired)  diastolic dysfunction, normal LAP. Calculated EF 60%.  Structurally normal tricuspid valve with trace regurgitation. No evidence  of pulmonary hypertension.  No significant change compared to 12/2010.   Patient Profile     78 y.o. male with history of HFpEF, chronic hypoxic respiratory failure on 2 L supplemental oxygen , CKD stage IV, MGUS, HTN, HLD, and OSA not on CPAP who we are seeing the evaluation of acute on chronic heart failure and hypoxic respiratory failure in the setting of worsening acute on chronic renal failure at the request of Dr. Isadora.   Assessment & Plan    1. 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 Presumed cardiogenic shock: -Remains on vasopressor support  -Ideally we would get a CVP and CoOX -Unable to get a central line in his neck due to body habitus -Would consider a PICC line but given his renal dysfunction this would have to be cleared by nephrology   3. Elevated troponin: -Mildly elevated and flat trending -Likely secondary to supply demand ischemia in the setting of the above -Not  consistent with primary ischemic event -No indication for heparin gtt at this time, also with thrombocytopenia -Not a candidate for invasive ischemic testing at this time  4. OSA: -Needs CPAP as outpatient      For questions or updates, please contact [facility] Please consult [email or web address] for contact info under Cardiology/STEMI.    Signed, Bernardino Bring, PA-C [facility] Pager: [phone] 03/10/2024, 8:35 AM

## 2024-03-10 NOTE — Progress Notes (Signed)
 [facility]  PROGRESS NOTE   Subjective:   Patient seen in the ICU.  He is now intubated and sedated.  Blood pressures are still low.  Continuing on CRRT.  Objective:  Vital signs: Blood pressure 118/66, pulse (!) 57, temperature 98.5 F (36.9 C), temperature source Axillary, resp. rate (!) 22, height 5' 9 (1.753 m), weight (!) 139.8 kg, SpO2 98%.  Intake/Output Summary (Last 24 hours) at 03/10/2024 1107 Last data filed at 03/10/2024 1100 Gross per 24 hour  Intake 2846.73 ml  Output 2762 ml  Net 84.73 ml   Filed Weights   03/08/24 1842 03/09/24 0315 03/10/24 0545  Weight: (!) 144.1 kg 131.8 kg (!) 139.8 kg     Physical Exam: General:  No acute distress  Head:  Normocephalic, atraumatic. Moist oral mucosal membranes  Eyes:  Anicteric  Neck:  Supple  Lungs:   Clear to auscultation, normal effort  Heart:  S1S2 no rubs  Abdomen:   Soft, nontender, bowel sounds present  Extremities: 1+ peripheral edema.  Neurologic: Intubated and sedated.  Skin:  No lesions  Access:     Basic Metabolic Panel: Recent Labs  Lab 03/07/24 0442 03/08/24 0311 03/08/24 1932 03/09/24 0435 03/09/24 1757 03/10/24 0441  NA 141 136 133* 129* 131* 133*  K 4.1 4.8 5.4* 4.8 4.8 4.6  CL 101 97* 94* 94* 101 101  CO2 26 29 27 26  21* 23  GLUCOSE 70 141* 142* 226* 131* 150*  BUN 51* 64* 74* 60* 46* 33*  CREATININE 4.58* 5.86* 6.62* 5.53* 3.92* 2.98*  CALCIUM  8.8* 8.9 9.1 8.1* 8.0* 8.2*  MG 2.6*  --  2.7* 2.5*  --  2.3  PHOS 5.7* 7.3* 8.6* 6.6*  6.5* 3.5 3.7   GFR: Estimated Creatinine Clearance: 28.4 mL/min (A) (by C-G formula based on SCr of 2.98 mg/dL (H)).  Liver Function Tests: Recent Labs  Lab 03/06/24 2129 03/07/24 0442 03/08/24 0311 03/08/24 1932 03/09/24 0435 03/09/24 1757 03/10/24 0441  AST 20 23  --  23  --   --   --   ALT 18 18  --  20  --   --   --   ALKPHOS 91 88  --  90  --   --   --   BILITOT 0.3 0.3  --  0.4  --   --   --   PROT 6.7 6.7  --  7.0  --    --   --   ALBUMIN 3.7 3.7 3.8 3.9 3.6 3.4* 3.1*   No results for input(s): LIPASE, AMYLASE in the last 168 hours. No results for input(s): AMMONIA in the last 168 hours.  CBC: Recent Labs  Lab 03/07/24 0442 03/08/24 0311 03/08/24 1932 03/09/24 0435 03/10/24 0441  WBC 10.5 12.7* 17.1* 16.2* 9.7  NEUTROABS  --   --  15.3*  --   --   HGB 11.7* 11.7* 12.0* 11.6* 10.7*  HCT 39.3 38.6* 39.8 38.6* 33.0*  MCV 109.5* 107.2* 107.6* 106.9* 100.0  PLT 121* 139* 179 153 86*     HbA1C: Hgb A1c MFr Bld  Date/Time Value Ref Range Status  03/08/2024 03:11 AM 5.2 4.8 - 5.6 % Final    Comment:    (NOTE) Diagnosis of Diabetes The following HbA1c ranges recommended by the American Diabetes Association (ADA) may be used as an aid in the diagnosis of diabetes mellitus.  Hemoglobin             Suggested A1C NGSP%  Diagnosis  <5.7                   Non Diabetic  5.7-6.4                Pre-Diabetic  >6.4                   Diabetic  <7.0                   Glycemic control for                       adults with diabetes.    05/02/2020 02:37 PM 6.9 (H) 4.8 - 5.6 % Final    Comment:    (NOTE) Pre diabetes:          5.7%-6.4%  Diabetes:              >6.4%  Glycemic control for   <7.0% adults with diabetes     Urinalysis: No results for input(s): COLORURINE, LABSPEC, PHURINE, GLUCOSEU, HGBUR, BILIRUBINUR, KETONESUR, PROTEINUR, UROBILINOGEN, NITRITE, LEUKOCYTESUR in the last 72 hours.  Invalid input(s): APPERANCEUR    Imaging: CT HEAD WO CONTRAST ( ) Result Date: 03/10/2024 EXAM: CT HEAD WITHOUT CONTRAST 03/10/2024 10:05:27 AM TECHNIQUE: CT of the head was performed without the administration of intravenous contrast. Automated exposure control, iterative reconstruction, and/or weight based adjustment of the mA/kV was utilized to reduce the radiation dose to as low as reasonably achievable. COMPARISON: None available. CLINICAL HISTORY:  Seizure, new-onset, no history of trauma. FINDINGS: BRAIN AND VENTRICLES: There is mild age-related cerebral volume loss present. No acute hemorrhage. No evidence of acute infarct. No hydrocephalus. No extra-axial collection. No mass effect or midline shift. ORBITS: No acute abnormality. SINUSES: No acute abnormality. SOFT TISSUES AND SKULL: Oral tracheal and orogastric tubes are present. No acute soft tissue abnormality. No skull fracture. IMPRESSION: 1. No acute intracranial abnormality. 2. Mild age-related cerebral volume loss. Electronically signed by: Evalene Coho MD 03/10/2024 10:09 AM EST RP Workstation: [id number]   DG Abd Portable 1V Result Date: 03/09/2024 CLINICAL DATA:  Enteric catheter placement EXAM: PORTABLE ABDOMEN - 1 VIEW COMPARISON:  None Available. FINDINGS: Frontal view of the lower chest and upper abdomen demonstrates enteric catheter passing below diaphragm, tip and side port projecting over the gastric body. Bowel gas pattern is unremarkable. IMPRESSION: 1. Enteric catheter tip projecting over the gastric body. Electronically Signed   By: Ozell Daring M.D.   On: 03/09/2024 16:38   DG Chest Port 1 View Result Date: 03/09/2024 CLINICAL DATA:  Status post intubation and orogastric tube placement. EXAM: PORTABLE CHEST 1 VIEW COMPARISON:  March 06, 2024 FINDINGS: Endotracheal tube is seen with its distal tip approximately 2.3 cm from the carina. An enteric tube is noted with its distal end extending below the diaphragm into the body of the stomach. The cardiac silhouette is mildly enlarged and unchanged in size. There is moderate severity calcification of the aortic arch. Low lung volumes are noted. There are small bilateral pleural effusions. Mild bibasilar atelectatic changes are suspected. No pneumothorax is identified. The visualized skeletal structures are unremarkable. IMPRESSION: 1. Endotracheal tube and enteric tube positioning, as described above. 2. Low lung volumes with  small bilateral pleural effusions and mild bibasilar atelectatic changes. Electronically Signed   By: Suzen Dials M.D.   On: 03/09/2024 16:36     Medications:    dexmedetomidine (PRECEDEX) IV infusion Stopped (03/09/24 1518)   heparin 1,100 Units/hr (  03/10/24 1100)   norepinephrine (LEVOPHED) Adult infusion 6 mcg/min (03/10/24 1100)   prismasol BGK 4/2.5 400 mL/hr at 03/10/24 0050   prismasol BGK 4/2.5 400 mL/hr at 03/10/24 0049   prismasol BGK 4/2.5 2,500 mL/hr at 03/10/24 0840   propofol (DIPRIVAN) infusion 50 mcg/kg/min (03/10/24 1100)   vasopressin 0.04 Units/min (03/10/24 1100)    Chlorhexidine Gluconate Cloth  6 each Topical Daily   insulin  aspart  0-6 Units Subcutaneous Q4H   ipratropium-albuterol   3 mL Nebulization Q6H   multivitamin  1 tablet Per Tube QHS   mouth rinse  15 mL Mouth Rinse Q2H   pantoprazole  (PROTONIX ) IV  40 mg Intravenous QHS    Assessment/ Plan:     78 y.o. 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  He was started on CRRT last night and has been tolerating so far.  He was admitted to the hospital twice in the last 3 weeks with history of congestive heart failure.  He is presently on norepinephrine for blood pressure support.   #1: Acute kidney injury on chronic kidney disease: Patient has chronic kidney disease with a baseline creatinine of 2.5-2.8.  Acute kidney injury with an estimated GFR of 9 cc/min on hyperkalemia with metabolic acidosis is most likely secondary to sepsis possibly secondary to pneumonia and circulatory shock with decreased cardiac output leading to ischemic nephropathy.  Patient was initiated on CRRT for  oligoanuric renal failure with hyperkalemia and hypotension. Presently tolerating CRRT with no net fluid removal.  If the blood pressure improves we will start to remove at least 100 cc to 150 cc/h.  Renal ultrasound showed chronicity.  Will dose all medications for GFR less than 20 cc/min   #2: COPD exacerbation/pneumonia/acute respiratory failure: Present was initially on BiPAP and now intubated and sedated.  Will continue care as per ICU protocols.   #3: Sepsis/pneumonia: Patient was treated with Rocephin and azithromycin on admission.   #4: Hyperkalemia: Has improved after initiating the CRRT.   #5: Congestive heart failure: Patient initially had elevated troponins probably secondary to demand ischemia.  Will attempt fluid removal once patient becomes hemodynamically stable.   #6: Anemia/MGUS: Will continue to monitor closely.  Labs and medications reviewed. Will continue to follow along with you.   LOS: 2 Pinkey Edman, MD [facility] 11/16/202511:07 AM

## 2024-03-11 ENCOUNTER — Inpatient Hospital Stay

## 2024-03-11 DIAGNOSIS — J9621 Acute and chronic respiratory failure with hypoxia: Secondary | ICD-10-CM | POA: Diagnosis not present

## 2024-03-11 DIAGNOSIS — R4182 Altered mental status, unspecified: Secondary | ICD-10-CM | POA: Diagnosis not present

## 2024-03-11 DIAGNOSIS — R569 Unspecified convulsions: Secondary | ICD-10-CM

## 2024-03-11 DIAGNOSIS — I5033 Acute on chronic diastolic (congestive) heart failure: Secondary | ICD-10-CM | POA: Diagnosis not present

## 2024-03-11 LAB — BLOOD GAS, ARTERIAL
Acid-Base Excess: 0.3 mmol/L (ref 0.0–2.0)
Bicarbonate: 26 mmol/L (ref 20.0–28.0)
FIO2: 50 %
MECHVT: 480 mL
Mechanical Rate: 22
O2 Saturation: 98.9 %
PEEP: 8 cmH2O
Patient temperature: 37
pCO2 arterial: 45 mmHg (ref 32–48)
pH, Arterial: 7.37 (ref 7.35–7.45)
pO2, Arterial: 114 mmHg — ABNORMAL HIGH (ref 83–108)

## 2024-03-11 LAB — RENAL FUNCTION PANEL
Albumin: 3.2 g/dL — ABNORMAL LOW (ref 3.5–5.0)
Albumin: 3.1 g/dL — ABNORMAL LOW (ref 3.5–5.0)
Anion gap: 7 (ref 5–15)
Anion gap: 9 (ref 5–15)
BUN: 18 mg/dL (ref 8–23)
BUN: 12 mg/dL (ref 8–23)
CO2: 25 mmol/L (ref 22–32)
CO2: 26 mmol/L (ref 22–32)
Calcium: 8.5 mg/dL — ABNORMAL LOW (ref 8.9–10.3)
Calcium: 8.5 mg/dL — ABNORMAL LOW (ref 8.9–10.3)
Chloride: 103 mmol/L (ref 98–111)
Chloride: 100 mmol/L (ref 98–111)
Creatinine, Ser: 1.72 mg/dL — ABNORMAL HIGH (ref 0.61–1.24)
Creatinine, Ser: 1.56 mg/dL — ABNORMAL HIGH (ref 0.61–1.24)
GFR, Estimated: 40 mL/min — ABNORMAL LOW (ref >60)
GFR, Estimated: 45 mL/min — ABNORMAL LOW (ref >60)
Glucose, Bld: 104 mg/dL — ABNORMAL HIGH (ref 70–99)
Glucose, Bld: 99 mg/dL (ref 70–99)
Phosphorus: 2.3 mg/dL — ABNORMAL LOW (ref 2.5–4.6)
Phosphorus: 2.3 mg/dL — ABNORMAL LOW (ref 2.5–4.6)
Potassium: 3.9 mmol/L (ref 3.5–5.1)
Potassium: 3.9 mmol/L (ref 3.5–5.1)
Sodium: 135 mmol/L (ref 135–145)
Sodium: 134 mmol/L — ABNORMAL LOW (ref 135–145)

## 2024-03-11 LAB — MAGNESIUM: Magnesium: 2.4 mg/dL (ref 1.7–2.4)

## 2024-03-11 LAB — GLUCOSE, CAPILLARY
Glucose-Capillary: 98 mg/dL (ref 70–99)
Glucose-Capillary: 94 mg/dL (ref 70–99)
Glucose-Capillary: 99 mg/dL (ref 70–99)
Glucose-Capillary: 121 mg/dL — ABNORMAL HIGH (ref 70–99)
Glucose-Capillary: 99 mg/dL (ref 70–99)
Glucose-Capillary: 100 mg/dL — ABNORMAL HIGH (ref 70–99)

## 2024-03-11 LAB — COOXEMETRY PANEL
Carboxyhemoglobin: 1.8 % — ABNORMAL HIGH (ref 0.5–1.5)
Methemoglobin: 0.7 % (ref 0.0–1.5)
O2 Saturation: 86.2 %
Total hemoglobin: 11.8 g/dL — ABNORMAL LOW (ref 12.0–16.0)
Total oxygen content: 84.1 %

## 2024-03-11 LAB — CBC
HCT: 33.5 % — ABNORMAL LOW (ref 39.0–52.0)
Hemoglobin: 10.7 g/dL — ABNORMAL LOW (ref 13.0–17.0)
MCH: 31.8 pg (ref 26.0–34.0)
MCHC: 31.9 g/dL (ref 30.0–36.0)
MCV: 99.7 fL (ref 80.0–100.0)
Platelets: 79 K/uL — ABNORMAL LOW (ref 150–400)
RBC: 3.36 MIL/uL — ABNORMAL LOW (ref 4.22–5.81)
RDW: 13.9 % (ref 11.5–15.5)
WBC: 9.9 K/uL (ref 4.0–10.5)
nRBC: 0 % (ref 0.0–0.2)

## 2024-03-11 LAB — HEPARIN LEVEL (UNFRACTIONATED)
Heparin Unfractionated: 0.25 [IU]/mL — ABNORMAL LOW (ref 0.30–0.70)
Heparin Unfractionated: 0.3 [IU]/mL (ref 0.30–0.70)

## 2024-03-11 LAB — CULTURE, BLOOD (ROUTINE X 2)
Culture: NO GROWTH
Culture: NO GROWTH
Special Requests: ADEQUATE
Special Requests: ADEQUATE

## 2024-03-11 MED ORDER — FREE WATER
30.0000 mL | Status: DC
  Administered 2024-03-11 – 2024-03-15 (×21): 30 mL

## 2024-03-11 MED ORDER — SERTRALINE HCL 50 MG PO TABS
25.0000 mg | ORAL_TABLET | Freq: Every day | ORAL | Status: DC
  Administered 2024-03-11 – 2024-03-22 (×10): 25 mg
  Filled 2024-03-11 (×10): qty 1

## 2024-03-11 MED ORDER — IPRATROPIUM-ALBUTEROL 0.5-2.5 (3) MG/3ML IN SOLN
3.0000 mL | Freq: Three times a day (TID) | RESPIRATORY_TRACT | Status: DC
  Administered 2024-03-12 – 2024-03-21 (×28): 3 mL via RESPIRATORY_TRACT
  Filled 2024-03-11 (×29): qty 3

## 2024-03-11 MED ORDER — SODIUM CHLORIDE 0.9 % IV SOLN
500.0000 [IU]/h | INTRAVENOUS | Status: DC
  Administered 2024-03-12 – 2024-03-13 (×3): 500 [IU]/h via INTRAVENOUS_CENTRAL
  Filled 2024-03-11 (×3): qty 2

## 2024-03-11 MED ORDER — FENTANYL CITRATE (PF) 50 MCG/ML IJ SOSY
50.0000 ug | PREFILLED_SYRINGE | INTRAMUSCULAR | Status: DC | PRN
  Administered 2024-03-11 – 2024-03-23 (×8): 50 ug via INTRAVENOUS
  Filled 2024-03-11 (×9): qty 1

## 2024-03-11 MED ORDER — VITAL HP 1.0 CAL PO LIQD
1000.0000 mL | ORAL | Status: DC
  Administered 2024-03-11: 1000 mL

## 2024-03-11 MED ORDER — HEPARIN SODIUM (PORCINE) 1000 UNIT/ML DIALYSIS
1000.0000 [IU] | INTRAMUSCULAR | Status: DC | PRN
  Administered 2024-03-15 (×2): 2000 [IU] via INTRAVENOUS_CENTRAL

## 2024-03-11 MED ORDER — POTASSIUM PHOSPHATES 15 MMOLE/5ML IV SOLN
15.0000 mmol | Freq: Once | INTRAVENOUS | Status: AC
  Administered 2024-03-11: 15 mmol via INTRAVENOUS
  Filled 2024-03-11: qty 5

## 2024-03-11 MED ORDER — THIAMINE MONONITRATE 100 MG PO TABS
100.0000 mg | ORAL_TABLET | Freq: Every day | ORAL | Status: DC
  Administered 2024-03-11 – 2024-03-15 (×5): 100 mg
  Filled 2024-03-11 (×5): qty 1

## 2024-03-11 MED ORDER — PROSOURCE TF20 ENFIT COMPATIBL EN LIQD
60.0000 mL | Freq: Two times a day (BID) | ENTERAL | Status: DC
  Administered 2024-03-11 – 2024-03-12 (×2): 60 mL
  Filled 2024-03-11: qty 60

## 2024-03-11 MED ORDER — FUROSEMIDE 10 MG/ML IJ SOLN
60.0000 mg | Freq: Once | INTRAMUSCULAR | Status: AC
  Administered 2024-03-11: 60 mg via INTRAVENOUS
  Filled 2024-03-11: qty 6

## 2024-03-11 NOTE — Progress Notes (Signed)
 Progress Note  Patient Name: Dave Brown Date of Encounter: 03/11/2024  Primary Cardiologist: [place]  Subjective   Remains intubated, sedated, and on vasopressin and Levophed. Renal function improving with CRRT.  CVP was elevated this morning to 20 but improved to 12 with more aggressive fluid removal with CRRT.  Inpatient Medications    Scheduled Meds:  Chlorhexidine Gluconate Cloth  6 each Topical Daily   insulin  aspart  0-6 Units Subcutaneous Q4H   ipratropium-albuterol   3 mL Nebulization Q6H   multivitamin  1 tablet Per Tube QHS   mouth rinse  15 mL Mouth Rinse Q2H   pantoprazole  (PROTONIX ) IV  40 mg Intravenous QHS   sertraline   25 mg Per Tube Daily   sodium chloride  flush  10-40 mL Intracatheter Q12H   Continuous Infusions:  heparin 1,300 Units/hr (03/11/24 1400)   norepinephrine (LEVOPHED) Adult infusion 6 mcg/min (03/11/24 1400)   prismasol BGK 4/2.5 400 mL/hr at 03/11/24 0439   prismasol BGK 4/2.5 400 mL/hr at 03/11/24 0439   prismasol BGK 4/2.5 2,500 mL/hr at 03/11/24 1341   propofol (DIPRIVAN) infusion 50 mcg/kg/min (03/11/24 1438)   vasopressin Stopped (03/10/24 2338)   PRN Meds: docusate sodium, fentaNYL  (SUBLIMAZE ) injection, heparin, ipratropium-albuterol , mouth rinse, polyethylene glycol, sodium chloride  flush   Vital Signs    Vitals:   03/11/24 1215 03/11/24 1230 03/11/24 1245 03/11/24 1300  BP:      Pulse: 69 68 68 70  Resp: (!) 22 (!) 22 (!) 22 (!) 22  Temp:      TempSrc:      SpO2: 98% 98% 97% 98%  Weight:      Height:        Intake/Output Summary (Last 24 hours) at 03/11/2024 1504 Last data filed at 03/11/2024 1400 Gross per 24 hour  Intake 1693.56 ml  Output 3890 ml  Net -2196.44 ml   Filed Weights   03/09/24 0315 03/10/24 0545 03/11/24 0400  Weight: 131.8 kg (!) 139.8 kg (!) 139.7 kg    Telemetry    Sinus bradycardia, 50s bpm, first degree AV block - Personally Reviewed  ECG    No new tracings - Personally  Reviewed  Physical Exam   GEN: No acute distress.   Neck: JVD difficult to assess secondary to respiratory support apparatus. Cardiac: RRR, no murmurs, rubs, or gallops.  Respiratory: Vented breath sounds bilaterally.  GI: Soft, nontender, non-distended.   MS: 1+ bilateral lower extremity edema; No deformity. Neuro:  Intubated and sedated.  Psych: Intubated and sedated.  Labs    Chemistry Recent Labs  Lab 03/06/24 2129 03/07/24 0442 03/08/24 0311 03/08/24 1932 03/09/24 0435 03/10/24 0441 03/10/24 1558 03/11/24 0429  NA 141 141   < > 133*   < > 133* 133* 135  K 4.1 4.1   < > 5.4*   < > 4.6 4.3 3.9  CL 102 101   < > 94*   < > 101 101 103  CO2 31 26   < > 27   < > 23 22 25   GLUCOSE 48* 70   < > 142*   < > 150* 167* 104*  BUN 46* 51*   < > 74*   < > 33* 24* 18  CREATININE 4.15* 4.58*   < > 6.62*   < > 2.98* 2.24* 1.72*  CALCIUM  9.0 8.8*   < > 9.1   < > 8.2* 8.0* 8.5*  PROT 6.7 6.7  --  7.0  --   --   --   --  ALBUMIN 3.7 3.7   < > 3.9   < > 3.1* 2.9* 3.2*  AST 20 23  --  23  --   --   --   --   ALT 18 18  --  20  --   --   --   --   ALKPHOS 91 88  --  90  --   --   --   --   BILITOT 0.3 0.3  --  0.4  --   --   --   --   GFRNONAA 14* 12*   < > 8*   < > 21* 29* 40*  ANIONGAP 8 13   < > 12   < > 10 10 7    < > = values in this interval not displayed.     Hematology Recent Labs  Lab 03/09/24 0435 03/10/24 0441 03/11/24 0429  WBC 16.2* 9.7 9.9  RBC 3.61* 3.30* 3.36*  HGB 11.6* 10.7* 10.7*  HCT 38.6* 33.0* 33.5*  MCV 106.9* 100.0 99.7  MCH 32.1 32.4 31.8  MCHC 30.1 32.4 31.9  RDW 13.9 13.6 13.9  PLT 153 86* 79*    Cardiac EnzymesNo results for input(s): TROPONINI in the last 168 hours. No results for input(s): TROPIPOC in the last 168 hours.   BNP Recent Labs  Lab 03/06/24 2129  PROBNP 16,139.0*     DDimer No results for input(s): DDIMER in the last 168 hours.   Radiology    DG Abd Portable 1V Result Date: 03/09/2024 IMPRESSION: 1. Enteric  catheter tip projecting over the gastric body. Electronically Signed   By: Ozell Daring M.D.   On: 03/09/2024 16:38   DG Chest Port 1 View Result Date: 03/09/2024 IMPRESSION: 1. Endotracheal tube and enteric tube positioning, as described above. 2. Low lung volumes with small bilateral pleural effusions and mild bibasilar atelectatic changes. Electronically Signed   By: Suzen Dials M.D.   On: 03/09/2024 16:36    Cardiac Studies   Limited echo 03/07/2024: 1. Limited Echo to evaluate LV function.   2. No LV thrombus by Definity. Left ventricular ejection fraction, by  estimation, is 60 to 65%. The left ventricle has normal function. The left  ventricle has no regional wall motion abnormalities. There is severe left  ventricular hypertrophy of the  septal segment. Left ventricular diastolic parameters are consistent with  Grade III diastolic dysfunction (restrictive). Elevated left atrial  pressure.   3. Right ventricular systolic function is normal. The right ventricular  size is normal. Tricuspid regurgitation signal is inadequate for assessing  PA pressure.   4. Aortic dilatation noted. There is mild dilatation of the aortic root,  measuring 40 mm.   5. The inferior vena cava is dilated in size with <50% respiratory  variability, suggesting right atrial pressure of 15 mmHg.   Comparison(s): No significant change from prior study.  __________  2D echo 02/02/2024: 1. Left ventricular ejection fraction, by estimation, is 60 to 65%. The  left ventricle has normal function. The left ventricle has no regional  wall motion abnormalities. There is moderate concentric left ventricular  hypertrophy. Left ventricular  diastolic parameters are consistent with Grade III diastolic dysfunction  (restrictive). Elevated left ventricular end-diastolic pressure.   2. Right ventricular systolic function is normal. The right ventricular  size is normal. There is normal pulmonary artery  systolic pressure.   3. Left atrial size was moderately dilated.   4. The mitral valve is normal in structure. Trivial mitral  valve  regurgitation. No evidence of mitral stenosis.   5. The aortic valve is tricuspid. There is mild calcification of the  aortic valve. Aortic valve regurgitation is not visualized. Aortic valve  sclerosis is present, with no evidence of aortic valve stenosis.   6. The inferior vena cava is dilated in size with <50% respiratory  variability, suggesting right atrial pressure of 15 mmHg.  __________  Echo 03/14/2022: Normal LV systolic function with visual EF 55-60%. Left ventricle cavity  is normal in size. Moderate concentric hypertrophy of the left ventricle.  Normal global wall motion. Doppler evidence of grade I (impaired)  diastolic dysfunction, normal LAP. Calculated EF 60%.  Structurally normal tricuspid valve with trace regurgitation. No evidence  of pulmonary hypertension.  No significant change compared to 12/2010.   Patient Profile     78 y.o. male with history of HFpEF, chronic hypoxic respiratory failure on 2 L supplemental oxygen , CKD stage IV, MGUS, HTN, HLD, and OSA not on CPAP who we are seeing the evaluation of acute on chronic heart failure and hypoxic respiratory failure in the setting of worsening acute on chronic renal failure at the request of Dr. Isadora.   Assessment & Plan    1. Acute on chronic hypoxic respiratory failure in the setting of acute on chronic CKD stage IV requiring CRRT and acute on chronic HFpEF: -Multifactorial volume overload including acute on chronic CKD now requiring CRRT and acute on chronic HFpEF -Volume management per CRRT -Unable to escalate GDMT due to renal dysfunction and hypotension -Would benefit from cMRI/PYP as an outpatient for evaluation of cardiac amyloidosis given degree of LVH on echo.  2.  Acute on chronic diastolic heart failure: Echo with severe LVH and grade 3 diastolic dysfunction with normal  EF. CVP seems to be improving with more aggressive removal of fluid with CRRT. -Consider outpatient PYP scan.  3. Elevated troponin: -Mildly elevated and flat trending -Likely secondary to supply demand ischemia in the setting of the above -Not consistent with primary ischemic event -No indication for heparin gtt at this time, also with thrombocytopenia -Not a candidate for invasive ischemic testing at this time  4. OSA: -Needs CPAP as outpatient      For questions or updates, please contact [facility] Please consult [email or web address] for contact info under Cardiology/STEMI.    Signed, Deatrice Cage, MD [facility] 03/11/2024, 3:04 PM

## 2024-03-11 NOTE — Progress Notes (Signed)
 Heart Failure Navigator Progress Note  Heart Failure Navigator consult placed for patient while at [facility] and then he was transferred to [facility].   Patient does not meet criteria due to multifactorial volume overload.  Grade 3 Diastolic Dysfunction.  Renal function and hypotension prohibit GDMT titration.  AKI requiring initiation of CRRT.    Navigator will sign off at this time.  Charmaine Pines, RN, BSN [facility] Heart Failure Navigator Secure Chat Only

## 2024-03-11 NOTE — Consult Note (Signed)
 PHARMACY - ANTICOAGULATION CONSULT NOTE  Pharmacy Consult for Heparin infusion Indication: CRRT clot prevention  No Known Allergies  Patient Measurements: Height: 5' 9 (175.3 cm) Weight: (!) 139.7 kg (307 lb 15.7 oz) IBW/kg (Calculated) : 70.7  Vital Signs: Temp: 98.4 F (36.9 C) (11/17 0800) Temp Source: Axillary (11/17 0400) Pulse Rate: 70 (11/17 1300)  Labs: Recent Labs    03/08/24 1932 03/09/24 0435 03/09/24 1757 03/10/24 0441 03/10/24 1558 03/11/24 0429 03/11/24 1343  HGB 12.0* 11.6*  --  10.7*  --  10.7*  --   HCT 39.8 38.6*  --  33.0*  --  33.5*  --   PLT 179 153  --  86*  --  79*  --   APTT 29  --   --   --   --   --   --   LABPROT 14.3  --   --   --   --   --   --   INR 1.0  --   --   --   --   --   --   HEPARINUNFRC  --   --    < > 0.32  --  0.25* 0.30  CREATININE 6.62* 5.53*   < > 2.98* 2.24* 1.72*  --    < > = values in this interval not displayed.    Estimated Creatinine Clearance: 49.2 mL/min (A) (by C-G formula based on SCr of 1.72 mg/dL (H)).   Medical History: Past Medical History:  Diagnosis Date   CHF (congestive heart failure) (HCC)    Degenerative joint disease    High cholesterol    Hypertension    Kidney stones    Obesity    Respiratory failure with hypoxia (HCC)    Sleep apnea     Medications:  No anticoagulation PTA  Assessment: Patient is a 78 y/o male with a PMH significant for HFpEF, COPD, and acute on chronic hypoxic respiratory failure, currently with AKI requiring CRRT. Despite heparin lock flushes, the CRRT circuit has continued to clot. Pharmacy is consulted for initiation and management of a heparin infusion to prevent further clotting during CRRT.  Baseline labs WNL: Hbg 11.6, PLT 153, INR 1.0, aPTT 29  Goal of Therapy:  Heparin level 0.3-0.7 units/ml Monitor platelets by anticoagulation protocol: Yes  11/15 2159 HL 0.32, therapeutic x 1 11/16 0441 HL 0.32, therapeutic x 2 11/17 0429 HL 0.25,  subtherapeutic  Plan: heparin level therapeutic but has consistently been borderline low or subtherapeutic Per MD: no heparin bolus Increase heparin infusion to 1,550 units/hr Recheck heparin level in 8 hrs after rate change CBC daily while on heparin  Adriana Bolster, PharmD, BCPS 03/11/2024 2:19 PM

## 2024-03-11 NOTE — Consult Note (Signed)
 PHARMACY CONSULT NOTE - ELECTROLYTES  Pharmacy Consult for Electrolyte Monitoring and Replacement   Recent Labs: Height: 5' 9 (175.3 cm) Weight: (!) 139.7 kg (307 lb 15.7 oz) IBW/kg (Calculated) : 70.7 Estimated Creatinine Clearance: 49.2 mL/min (A) (by C-G formula based on SCr of 1.72 mg/dL (H)). Potassium  Date Value  03/11/2024 3.9 mmol/L  07/18/2013 3.4 mEq/L (L)   Magnesium (mg/dL)  Date Value  88/82/7974 2.4   Calcium  (mg/dL)  Date Value  88/82/7974 8.5 (L)  07/18/2013 10.1   Albumin (g/dL)  Date Value  88/82/7974 3.2 (L)  07/18/2013 3.8   Phosphorus (mg/dL)  Date Value  88/82/7974 2.3 (L)   Sodium  Date Value  03/11/2024 135 mmol/L  07/18/2013 138 mEq/L    Assessment  Dave Brown is a 78 y.o. male presenting with AKI requiring CRRT. PMH significant for HFpEF and COPD requiring 2L supplemental oxygen  . Pharmacy has been consulted to monitor and replace electrolytes.  Diet: NPO MIVF: Primsasol CRRT fluid Pertinent medications: N/A  Goal of Therapy: Electrolytes WNL  Plan:  No replacement currently indicated Renal function panel ordered twice daily while on CRRT Check Mg, Phos with AM labs  Thank you for allowing pharmacy to be a part of this patient's care.  Dave Brown, PharmD Clinical Pharmacist 03/11/2024 7:03 AM

## 2024-03-11 NOTE — Progress Notes (Signed)
 Eeg done

## 2024-03-11 NOTE — Progress Notes (Signed)
 Updated pts daughter and son at bedside regarding pts condition and current plan of care.  All questions answered will continue to monitor and assess pt  Dave Brown, [facility]  Pulmonary/Critical Care Pager [phone] (please enter 7 digits) PCCM Consult Pager [phone] (please enter 7 digits)

## 2024-03-11 NOTE — Procedures (Signed)
 Patient Name: Bradford Cazier  MRN: [id number]  Epilepsy Attending: Arlin MALVA Krebs  Referring Physician/Provider: Malka Domino, MD  Date: 03/11/2024 Duration: 26.59 mins  Patient history: 78yo M with ams. EEG to evaluate for seizure  Level of alertness: Comatose  AEDs during EEG study: Propofol  Technical aspects: This EEG study was done with scalp electrodes positioned according to the 10-20 International system of electrode placement. Electrical activity was reviewed with band pass filter of 1-70Hz , sensitivity of 7 uV/mm, display speed of 34mm/sec with a 60Hz  notched filter applied as appropriate. EEG data were recorded continuously and digitally stored.  Video monitoring was available and reviewed as appropriate.  Description: EEG showed continuous generalized 3 to 6 Hz theta-delta slowing admixed with 13-15hz  beta activity distributed symmetrically and diffusely. Hyperventilation and photic stimulation were not performed.     ABNORMALITY - Continuous slow, generalized  IMPRESSION: This study is suggestive of severe diffuse encephalopathy, nonspecific etiology but likely related to sedation. No seizures or epileptiform discharges were seen throughout the recording.  Jessa Stinson O Serenite[facility] Fuertes

## 2024-03-11 NOTE — Progress Notes (Signed)
 [facility] [place], [place] 03/11/24  Subjective:   Hospital day # 66 78 year old male with hypertension, heart failure with preserved ejection fraction, hyperlipidemia, stage IV CKD, obstructive sleep apnea, MGUS, chronic respiratory failure on 2 L oxygen  presented from [facility] with shortness of breath.  Transferred to [facility] for need of continuous dialysis.  Patient remains critically ill, hypotensive requiring norepinephrine.  He is on ventilator support FiO2 50%/PEEP 8.  Sedated with propofol. OG tube, ET tube, Foley in place. Currently requiring CRRT. He has significant lower extremity edema.      Objective:  Vital signs in last 24 hours:  Temp:  [97.7 F (36.5 C)-98.6 F (37 C)] 98.4 F (36.9 C) (11/17 0800) Pulse Rate:  [58-80] 70 (11/17 1300) Resp:  [15-26] 22 (11/17 1300) SpO2:  [94 %-100 %] 98 % (11/17 1300) Arterial Line BP: (105-164)/(42-63) 132/49 (11/17 1300) FiO2 (%):  [40 %-50 %] 40 % (11/17 1351) Weight:  [139.7 kg] 139.7 kg (11/17 0400)  Weight change: -0.1 kg Filed Weights   03/09/24 0315 03/10/24 0545 03/11/24 0400  Weight: 131.8 kg (!) 139.8 kg (!) 139.7 kg    Intake/Output:    Intake/Output Summary (Last 24 hours) at 03/11/2024 1511 Last data filed at 03/11/2024 1400 Gross per 24 hour  Intake 1693.56 ml  Output 3890 ml  Net -2196.44 ml     Physical Exam: General: Critically ill-appearing  HEENT ET tube, OG tube in place  Pulm/lungs Related to assisted  CVS/Heart Tachycardic  Abdomen:  Nondistended  Extremities: Dependent edema is present  Neurologic: Dated  Skin: Warm, dry  Access: Left femoral dialysis catheter       Basic Metabolic Panel:  Recent Labs  Lab 03/07/24 0442 03/08/24 0311 03/08/24 1932 03/09/24 0435 03/09/24 1757 03/10/24 0441 03/10/24 1558 03/11/24 0429  NA 141   < > 133* 129* 131* 133* 133* 135  K 4.1   < > 5.4* 4.8 4.8 4.6 4.3 3.9  CL 101   < > 94* 94* 101 101 101 103  CO2  26   < > 27 26 21* 23 22 25   GLUCOSE 70   < > 142* 226* 131* 150* 167* 104*  BUN 51*   < > 74* 60* 46* 33* 24* 18  CREATININE 4.58*   < > 6.62* 5.53* 3.92* 2.98* 2.24* 1.72*  CALCIUM  8.8*   < > 9.1 8.1* 8.0* 8.2* 8.0* 8.5*  MG 2.6*  --  2.7* 2.5*  --  2.3  --  2.4  PHOS 5.7*   < > 8.6* 6.6*  6.5* 3.5 3.7 3.2 2.3*   < > = values in this interval not displayed.     CBC: Recent Labs  Lab 03/08/24 0311 03/08/24 1932 03/09/24 0435 03/10/24 0441 03/11/24 0429  WBC 12.7* 17.1* 16.2* 9.7 9.9  NEUTROABS  --  15.3*  --   --   --   HGB 11.7* 12.0* 11.6* 10.7* 10.7*  HCT 38.6* 39.8 38.6* 33.0* 33.5*  MCV 107.2* 107.6* 106.9* 100.0 99.7  PLT 139* 179 153 86* 79*     No results found for: HEPBSAG, HEPBSAB, HEPBIGM    Microbiology:  Recent Results (from the past 240 hours)  Blood culture (routine x 2)     Status: None   Collection Time: 03/06/24  9:29 PM   Specimen: BLOOD  Result Value Ref Range Status   Specimen Description BLOOD RIGHT ANTECUBITAL  Final   Special Requests   Final    BOTTLES  DRAWN AEROBIC AND ANAEROBIC Blood Culture adequate volume   Culture   Final    NO GROWTH 5 DAYS Performed at [facility], [address] [place], [place] [address]    Report Status 03/11/2024 FINAL  Final  Resp panel by RT-PCR (RSV, Flu A&B, Covid) Anterior Nasal Swab     Status: None   Collection Time: 03/06/24  9:33 PM   Specimen: Anterior Nasal Swab  Result Value Ref Range Status   SARS Coronavirus 2 by RT PCR NEGATIVE NEGATIVE Final    Comment: (NOTE) SARS-CoV-2 target nucleic acids are NOT DETECTED.  The SARS-CoV-2 RNA is generally detectable in upper respiratory specimens during the acute phase of infection. The lowest concentration of SARS-CoV-2 viral copies this assay can detect is 138 copies/mL. A negative result does not preclude SARS-Cov-2 infection and should not be used as the sole basis for treatment or other patient management decisions. A negative result may  occur with  improper specimen collection/handling, submission of specimen other than nasopharyngeal swab, presence of viral mutation(s) within the areas targeted by this assay, and inadequate number of viral copies(<138 copies/mL). A negative result must be combined with clinical observations, patient history, and epidemiological information. The expected result is Negative.  Fact Sheet for Patients:  bloggercourse.com  Fact Sheet for Healthcare Providers:  seriousbroker.it  This test is no t yet approved or cleared by the [place]  FDA and  has been authorized for detection and/or diagnosis of SARS-CoV-2 by FDA under an Emergency Use Authorization (EUA). This EUA will remain  in effect (meaning this test can be used) for the duration of the COVID-19 declaration under Section 564(b)(1) of the Act, 21 U.S.C.section 360bbb-3(b)(1), unless the authorization is terminated  or revoked sooner.       Influenza A by PCR NEGATIVE NEGATIVE Final   Influenza B by PCR NEGATIVE NEGATIVE Final    Comment: (NOTE) The Xpert Xpress SARS-CoV-2/FLU/RSV plus assay is intended as an aid in the diagnosis of influenza from Nasopharyngeal swab specimens and should not be used as a sole basis for treatment. Nasal washings and aspirates are unacceptable for Xpert Xpress SARS-CoV-2/FLU/RSV testing.  Fact Sheet for Patients: bloggercourse.com  Fact Sheet for Healthcare Providers: seriousbroker.it  This test is not yet approved or cleared by the [place]  FDA and has been authorized for detection and/or diagnosis of SARS-CoV-2 by FDA under an Emergency Use Authorization (EUA). This EUA will remain in effect (meaning this test can be used) for the duration of the COVID-19 declaration under Section 564(b)(1) of the Act, 21 U.S.C. section 360bbb-3(b)(1), unless the authorization is terminated  or revoked.     Resp Syncytial Virus by PCR NEGATIVE NEGATIVE Final    Comment: (NOTE) Fact Sheet for Patients: bloggercourse.com  Fact Sheet for Healthcare Providers: seriousbroker.it  This test is not yet approved or cleared by the [place]  FDA and has been authorized for detection and/or diagnosis of SARS-CoV-2 by FDA under an Emergency Use Authorization (EUA). This EUA will remain in effect (meaning this test can be used) for the duration of the COVID-19 declaration under Section 564(b)(1) of the Act, 21 U.S.C. section 360bbb-3(b)(1), unless the authorization is terminated or revoked.  Performed at [facility], [address] [place], [place] [address]   Blood culture (routine x 2)     Status: None   Collection Time: 03/06/24 10:55 PM   Specimen: BLOOD  Result Value Ref Range Status   Specimen Description BLOOD BLOOD RIGHT HAND  Final  Special Requests   Final    BOTTLES DRAWN AEROBIC AND ANAEROBIC Blood Culture adequate volume   Culture   Final    NO GROWTH 5 DAYS Performed at [facility], [address] [place], [place] [address]    Report Status 03/11/2024 FINAL  Final  MRSA Next Gen by PCR, Nasal     Status: None   Collection Time: 03/07/24  1:43 AM   Specimen: Nasal Mucosa; Nasal Swab  Result Value Ref Range Status   MRSA by PCR Next Gen NOT DETECTED NOT DETECTED Final    Comment: (NOTE) The GeneXpert MRSA Assay (FDA approved for NASAL specimens only), is one component of a comprehensive MRSA colonization surveillance program. It is not intended to diagnose MRSA infection nor to guide or monitor treatment for MRSA infections. Test performance is not FDA approved in patients less than 68 years old. Performed at [facility], [address] [place], [place] [address]   Respiratory (~20 pathogens) panel by PCR     Status: None   Collection Time: 03/07/24 11:18 AM   Specimen: Nasopharyngeal Swab;  Respiratory  Result Value Ref Range Status   Adenovirus NOT DETECTED NOT DETECTED Final   Coronavirus 229E NOT DETECTED NOT DETECTED Final    Comment: (NOTE) The Coronavirus on the Respiratory Panel, DOES NOT test for the novel  Coronavirus (2019 nCoV)    Coronavirus HKU1 NOT DETECTED NOT DETECTED Final   Coronavirus NL63 NOT DETECTED NOT DETECTED Final   Coronavirus OC43 NOT DETECTED NOT DETECTED Final   Metapneumovirus NOT DETECTED NOT DETECTED Final   Rhinovirus / Enterovirus NOT DETECTED NOT DETECTED Final   Influenza A NOT DETECTED NOT DETECTED Final   Influenza B NOT DETECTED NOT DETECTED Final   Parainfluenza Virus 1 NOT DETECTED NOT DETECTED Final   Parainfluenza Virus 2 NOT DETECTED NOT DETECTED Final   Parainfluenza Virus 3 NOT DETECTED NOT DETECTED Final   Parainfluenza Virus 4 NOT DETECTED NOT DETECTED Final   Respiratory Syncytial Virus NOT DETECTED NOT DETECTED Final   Bordetella pertussis NOT DETECTED NOT DETECTED Final   Bordetella Parapertussis NOT DETECTED NOT DETECTED Final   Chlamydophila pneumoniae NOT DETECTED NOT DETECTED Final   Mycoplasma pneumoniae NOT DETECTED NOT DETECTED Final    Comment: Performed at [facility], [address] [place], [place] [address]  MRSA Next Gen by PCR, Nasal     Status: None   Collection Time: 03/08/24  7:23 PM   Specimen: Nasal Mucosa; Nasal Swab  Result Value Ref Range Status   MRSA by PCR Next Gen NOT DETECTED NOT DETECTED Final    Comment: (NOTE) The GeneXpert MRSA Assay (FDA approved for NASAL specimens only), is one component of a comprehensive MRSA colonization surveillance program. It is not intended to diagnose MRSA infection nor to guide or monitor treatment for MRSA infections. Test performance is not FDA approved in patients less than 75 years old. Performed at [facility], [address] [place], [place] [address]     Coagulation Studies: Recent Labs    03/08/24 1932  LABPROT 14.3   INR 1.0    Urinalysis: No results for input(s): COLORURINE, LABSPEC, PHURINE, GLUCOSEU, HGBUR, BILIRUBINUR, KETONESUR, PROTEINUR, UROBILINOGEN, NITRITE, LEUKOCYTESUR in the last 72 hours.  Invalid input(s): APPERANCEUR    Imaging: US  EKG SITE RITE Result Date: 03/10/2024 If Site Rite image not attached, placement could not be confirmed due to current cardiac rhythm.  CT HEAD WO CONTRAST ( ) Result Date: 03/10/2024 EXAM: CT  HEAD WITHOUT CONTRAST 03/10/2024 10:05:27 AM TECHNIQUE: CT of the head was performed without the administration of intravenous contrast. Automated exposure control, iterative reconstruction, and/or weight based adjustment of the mA/kV was utilized to reduce the radiation dose to as low as reasonably achievable. COMPARISON: None available. CLINICAL HISTORY: Seizure, new-onset, no history of trauma. FINDINGS: BRAIN AND VENTRICLES: There is mild age-related cerebral volume loss present. No acute hemorrhage. No evidence of acute infarct. No hydrocephalus. No extra-axial collection. No mass effect or midline shift. ORBITS: No acute abnormality. SINUSES: No acute abnormality. SOFT TISSUES AND SKULL: Oral tracheal and orogastric tubes are present. No acute soft tissue abnormality. No skull fracture. IMPRESSION: 1. No acute intracranial abnormality. 2. Mild age-related cerebral volume loss. Electronically signed by: Evalene Coho MD 03/10/2024 10:09 AM EST RP Workstation: [id number]   DG Abd Portable 1V Result Date: 03/09/2024 CLINICAL DATA:  Enteric catheter placement EXAM: PORTABLE ABDOMEN - 1 VIEW COMPARISON:  None Available. FINDINGS: Frontal view of the lower chest and upper abdomen demonstrates enteric catheter passing below diaphragm, tip and side port projecting over the gastric body. Bowel gas pattern is unremarkable. IMPRESSION: 1. Enteric catheter tip projecting over the gastric body. Electronically Signed   By: Ozell Daring M.D.   On:  03/09/2024 16:38   DG Chest Port 1 View Result Date: 03/09/2024 CLINICAL DATA:  Status post intubation and orogastric tube placement. EXAM: PORTABLE CHEST 1 VIEW COMPARISON:  March 06, 2024 FINDINGS: Endotracheal tube is seen with its distal tip approximately 2.3 cm from the carina. An enteric tube is noted with its distal end extending below the diaphragm into the body of the stomach. The cardiac silhouette is mildly enlarged and unchanged in size. There is moderate severity calcification of the aortic arch. Low lung volumes are noted. There are small bilateral pleural effusions. Mild bibasilar atelectatic changes are suspected. No pneumothorax is identified. The visualized skeletal structures are unremarkable. IMPRESSION: 1. Endotracheal tube and enteric tube positioning, as described above. 2. Low lung volumes with small bilateral pleural effusions and mild bibasilar atelectatic changes. Electronically Signed   By: Suzen Dials M.D.   On: 03/09/2024 16:36     Medications:    feeding supplement (VITAL HIGH PROTEIN)     heparin 1,300 Units/hr (03/11/24 1400)   norepinephrine (LEVOPHED) Adult infusion 6 mcg/min (03/11/24 1400)   prismasol BGK 4/2.5 400 mL/hr at 03/11/24 0439   prismasol BGK 4/2.5 400 mL/hr at 03/11/24 0439   prismasol BGK 4/2.5 2,500 mL/hr at 03/11/24 1341   propofol (DIPRIVAN) infusion 50 mcg/kg/min (03/11/24 1438)   vasopressin Stopped (03/10/24 2338)    Chlorhexidine Gluconate Cloth  6 each Topical Daily   feeding supplement (PROSource TF20)  60 mL Per Tube BID   free water  30 mL Per Tube Q4H   insulin  aspart  0-6 Units Subcutaneous Q4H   ipratropium-albuterol   3 mL Nebulization Q6H   multivitamin  1 tablet Per Tube QHS   mouth rinse  15 mL Mouth Rinse Q2H   pantoprazole  (PROTONIX ) IV  40 mg Intravenous QHS   sertraline   25 mg Per Tube Daily   sodium chloride  flush  10-40 mL Intracatheter Q12H   thiamine  100 mg Per Tube Daily   docusate sodium, fentaNYL   (SUBLIMAZE ) injection, heparin, ipratropium-albuterol , mouth rinse, polyethylene glycol, sodium chloride  flush  Assessment/ Plan:  78 y.o. male with    Obesity, HFpEF and obstructive sleep apnea/COPD requiring 2L supplemental oxygen , hyperlipidemia, chronic kidney disease stage IV, MGUS, hypertension  admitted on  03/08/2024 for Acute and chronic respiratory failure with hypoxia [J96.21] Hypotension [I95.9] Acute on chronic respiratory failure with hypoxia and hypercapnia (HCC) [G03.78, J96.22]  Acute kidney injury on chronic kidney disease stage IV.  Baseline creatinine of 2.5-2.8. Acute kidney injury is thought to be secondary to pneumonia and circulatory shock causing ATN.  Patient's urine output is close to 1200 to 1300 cc. electrolytes are in the acceptable range.  Currently tolerating fluid removal rate of -5200 cc/h. Plan -Will continue CRRT for now as patient is requiring pressor support.  Electrolytes and volume status are maintained.  Continue current potassium bath of 4K.  Ultrafiltration rate maintained at -100 cc/h. -Will try one-time dose of furosemide  IV 60 mg x 1.  If it increases urine output, recommend scheduling a dose twice a day.  Acute respiratory failure Currently requiring ventilator support.  FiO2 50/PEEP 8. Respiratory pathogen's panel by PCR is negative.  Blood cultures from 03/06/2024 are negative.    LOS: 3 Pharell Rolfson 11/17/20253:11 PM  [facility] [address] [place], [place] [phone]  Note: This note was prepared with Dragon dictation. Any transcription errors are unintentional

## 2024-03-11 NOTE — Consult Note (Signed)
 PHARMACY CONSULT NOTE - ELECTROLYTES  Pharmacy Consult for Electrolyte Monitoring and Replacement   Recent Labs: Height: 5' 9 (175.3 cm) Weight: (!) 139.7 kg (307 lb 15.7 oz) IBW/kg (Calculated) : 70.7 Estimated Creatinine Clearance: 54.3 mL/min (A) (by C-G formula based on SCr of 1.56 mg/dL (H)). Potassium  Date Value  03/11/2024 3.9 mmol/L  07/18/2013 3.4 mEq/L (L)   Magnesium (mg/dL)  Date Value  88/82/7974 2.4   Calcium  (mg/dL)  Date Value  88/82/7974 8.5 (L)  07/18/2013 10.1   Albumin (g/dL)  Date Value  88/82/7974 3.1 (L)  07/18/2013 3.8   Phosphorus (mg/dL)  Date Value  88/82/7974 2.3 (L)   Sodium  Date Value  03/11/2024 134 mmol/L (L)  07/18/2013 138 mEq/L    Assessment  Dave Brown is a 78 y.o. male presenting with AKI requiring CRRT. PMH significant for HFpEF and COPD requiring 2L supplemental oxygen  . Pharmacy has been consulted to monitor and replace electrolytes.  Diet: NPO MIVF: Primsasol CRRT fluid Pertinent medications: N/A  Goal of Therapy: Electrolytes WNL  Plan:  Phos 2.3, per protocol will replace with potassium phosphate 15 mmol IV x 1 Renal function panel ordered twice daily while on CRRT Check Mg, Phos with AM labs  Thank you for allowing pharmacy to be a part of this patient's care.  Lum VEAR Mania, PharmD Clinical Pharmacist 03/11/2024 4:54 PM

## 2024-03-11 NOTE — Plan of Care (Signed)
  Problem: Clinical Measurements: Goal: Ability to maintain clinical measurements within normal limits will improve Outcome: Progressing Goal: Will remain free from infection Outcome: Progressing Goal: Diagnostic test results will improve Outcome: Progressing Goal: Respiratory complications will improve Outcome: Progressing   Problem: Nutrition: Goal: Adequate nutrition will be maintained Outcome: Progressing   Problem: Coping: Goal: Level of anxiety will decrease Outcome: Progressing   Problem: Pain Managment: Goal: General experience of comfort will improve and/or be controlled Outcome: Progressing   Problem: Safety: Goal: Ability to remain free from injury will improve Outcome: Progressing   Problem: Skin Integrity: Goal: Risk for impaired skin integrity will decrease Outcome: Progressing

## 2024-03-11 NOTE — Plan of Care (Signed)
  Problem: Health Behavior/Discharge Planning: Goal: Ability to manage health-related needs will improve Outcome: Not Progressing   Problem: Clinical Measurements: Goal: Ability to maintain clinical measurements within normal limits will improve Outcome: Progressing Goal: Will remain free from infection Outcome: Not Progressing Goal: Respiratory complications will improve Outcome: Not Progressing Goal: Cardiovascular complication will be avoided Outcome: Progressing   Problem: Nutrition: Goal: Adequate nutrition will be maintained Outcome: Progressing   Problem: Elimination: Goal: Will not experience complications related to urinary retention Outcome: Progressing   Problem: Pain Managment: Goal: General experience of comfort will improve and/or be controlled Outcome: Progressing   Problem: Safety: Goal: Ability to remain free from injury will improve Outcome: Progressing   Problem: Metabolic: Goal: Ability to maintain appropriate glucose levels will improve Outcome: Progressing   Problem: Nutritional: Goal: Progress toward achieving an optimal weight will improve Outcome: Not Progressing

## 2024-03-11 NOTE — Progress Notes (Signed)
 [facility] received notification of Dave Brown dc 11/14. Per chart review the patient was transferred to [facility] on 03/08/24. No outreach warranted at this time. Patient is still currently inpatient status at [facility].    Ellouise Quale, RN CHESS Navigator [phone]

## 2024-03-11 NOTE — Progress Notes (Incomplete)
  Progress Note  Patient Name: Dave Brown Date of Encounter: 03/11/2024 [facility] Cardiologist: Newman JINNY Lawrence, MD ***  Interval Summary   ***  Vital Signs Vitals:   03/11/24 0815 03/11/24 0830 03/11/24 0845 03/11/24 0900  BP:      Pulse: 71 75 72 71  Resp: (!) 22 (!) 22 (!) 22 (!) 22  Temp:      TempSrc:      SpO2: 98% 98% 98% 98%  Weight:      Height:        Intake/Output Summary (Last 24 hours) at 03/11/2024 0916 Last data filed at 03/11/2024 0909 Gross per 24 hour  Intake 2010.61 ml  Output 3066 ml  Net -1055.39 ml      03/11/2024    4:00 AM 03/10/2024    5:45 AM 03/09/2024    3:15 AM  Last 3 Weights  Weight (lbs) 307 lb 15.7 oz 308 lb 3.3 oz 290 lb 9.1 oz  Weight (kg) 139.7 kg 139.8 kg 131.8 kg      Telemetry/ECG  *** - Personally Reviewed  Physical Exam  GEN: No acute distress.   Neck: No JVD Cardiac: ***RRR, no murmurs, rubs, or gallops.  Respiratory: Clear to auscultation bilaterally. GI: Soft, nontender, non-distended  MS: No edema  Assessment & Plan   Acute on chronic hypoxic respiratory failure in the setting of acute on chronic CKD stage IV requiring CRRT and acute on chronic HFpEF - multifactorial volume overload including acute on chronic CKD now requiring CRRT and acute on chronic HFpEF - volume management per CRRT {Are we signing off today?:[phone]} For questions or updates, please contact [facility] Please consult [email or web address] for contact info under       {TIP  Split Shared Billing  Do NOT delete any part of this including brackets If split shared billing is based upon MDM, disregard If billing will be based upon TIME you MUST document the number of minutes and a detailed list of what was done in that time in the following format Example - I spent ** minutes seeing this patient. During that time I reviewed their history, evaluated their symptoms, reviewed available labs, EKGs, studies, performed an exam  and formulated an assessment and plan   :1} {Select this only if you need to document critical care time (Optional):[phone]} Signed, Lexus Shampine VEAR Fishman, PA-C

## 2024-03-11 NOTE — Consult Note (Signed)
 PHARMACY - ANTICOAGULATION CONSULT NOTE  Pharmacy Consult for Heparin infusion Indication: CRRT clot prevention  No Known Allergies  Patient Measurements: Height: 5' 9 (175.3 cm) Weight: (!) 139.7 kg (307 lb 15.7 oz) IBW/kg (Calculated) : 70.7  Vital Signs: Temp: 98 F (36.7 C) (11/17 0400) Temp Source: Axillary (11/17 0400) Pulse Rate: 71 (11/17 0515)  Labs: Recent Labs    03/08/24 1932 03/09/24 0435 03/09/24 1757 03/09/24 2159 03/10/24 0441 03/10/24 1558 03/11/24 0429  HGB 12.0* 11.6*  --   --  10.7*  --  10.7*  HCT 39.8 38.6*  --   --  33.0*  --  33.5*  PLT 179 153  --   --  86*  --  79*  APTT 29  --   --   --   --   --   --   LABPROT 14.3  --   --   --   --   --   --   INR 1.0  --   --   --   --   --   --   HEPARINUNFRC  --   --   --  0.32 0.32  --  0.25*  CREATININE 6.62* 5.53*   < >  --  2.98* 2.24* 1.72*   < > = values in this interval not displayed.    Estimated Creatinine Clearance: 49.2 mL/min (A) (by C-G formula based on SCr of 1.72 mg/dL (H)).   Medical History: Past Medical History:  Diagnosis Date   CHF (congestive heart failure) (HCC)    Degenerative joint disease    High cholesterol    Hypertension    Kidney stones    Obesity    Respiratory failure with hypoxia (HCC)    Sleep apnea     Medications:  No anticoagulation PTA  Assessment: Patient is a 78 y/o male with a PMH significant for HFpEF, COPD, and acute on chronic hypoxic respiratory failure, currently with AKI requiring CRRT. Despite heparin lock flushes, the CRRT circuit has continued to clot. Pharmacy is consulted for initiation and management of a heparin infusion to prevent further clotting during CRRT.  Baseline labs WNL: Hbg 11.6, PLT 153, INR 1.0, aPTT 29  Goal of Therapy:  Heparin level 0.3-0.7 units/ml Monitor platelets by anticoagulation protocol: Yes  11/15 2159 HL 0.32, therapeutic x 1 11/16 0441 HL 0.32, therapeutic x 2 11/17 0429 HL 0.25, subtherapeutic  Plan:   Per MD: no heparin bolus Increase heparin infusion to 1,300 units/hr Recheck HL in 8 hrs after rate change CBC daily while on heparin  Rankin CANDIE Dills, PharmD, [facility] 03/11/2024 5:25 AM

## 2024-03-11 NOTE — Progress Notes (Signed)
 Nutrition Follow-up  DOCUMENTATION CODES:   Obesity unspecified  INTERVENTION:   -Continue renal MVI daily via tube -TF via OGT:   Initiate Vital High Protein @ 20 ml/hr and increase by 10 ml every 8 hours to goal rate of 60 ml/hr.   60 ml Prsource TF20 BID  30 ml free water flush every 4 hours  Tube feeding regimen provides 1600 kcal (100% of needs), 166 grams of protein, and 1204 ml of H2O. Total free water: 1384 ml daily   -100 mg thiamine daily via tube x 7 days -Monitor Mg, K, and Phos and replete as needed secondary to refeeding risk   NUTRITION DIAGNOSIS:   Increased nutrient needs related to acute illness (CRRT) as evidenced by estimated needs.  Ongoing  GOAL:   Provide needs based on ASPEN/SCCM guidelines  Progressing   MONITOR:   Vent status, TF tolerance  REASON FOR ASSESSMENT:   Consult Assessment of nutrition requirement/status  ASSESSMENT:   78 y.o. male presented to the [facility] ED with shortness of breath and increased edema. PMH includes CHF, HTN, CKD IV, and HLD. Admitted for acute on chronic HF and AKI on CKD; transferred [facility] for initiation of CRRT.  11/12 - Admitted to [facility] 11/14- transferred to [facility]; CRRT initiated 11/15 - BiPAP removed  11/15- intubated, seizure like episode 11/16- PICC placed  Patient is currently intubated on ventilator support. OGT currently connected to low, intermittent suction.  MV: 10.7 L/min Temp (24hrs), Avg:98.1 F (36.7 C), Min:97.7 F (36.5 C), Max:98.6 F (37 C)  Propofol: 39.5 ml/hr (provides 1043 kcals daily)  Reviewed I/O's: -1.1 L x 24 hours and -933 ml since admission  UOP: 1.3 L x 24 hours  OGT output: 625 ml x 24 hours  CRRT: 1.1 L x 24 hours  Patient intubated and sedated on the vent. No family present to obtain additional history.   Neurology following; likely presentation is from toxic metabolic encephalopathy in the setting of acute decompensation of his CKD with AKI,  pleural effusions, heart failure. Plan for EEG; if EEG is abnormal, may transfer to [place] for continuous EEG.   Medications reviewed and include protonix , levophed, and propofol.   Lab Results  Component Value Date   HGBA1C 5.2 03/08/2024   PTA DM medications are none.   Labs reviewed: CBGS: 94-144 (inpatient orders for glycemic control are 0-6 units insulin  aspart every 4 hours).    NUTRITION - FOCUSED PHYSICAL EXAM:  Flowsheet Row Most Recent Value  Orbital Region No depletion  Upper Arm Region No depletion  Thoracic and Lumbar Region No depletion  Buccal Region No depletion  Temple Region No depletion  Clavicle Bone Region No depletion  Clavicle and Acromion Bone Region No depletion  Scapular Bone Region No depletion  Dorsal Hand No depletion  Patellar Region No depletion  Anterior Thigh Region No depletion  Posterior Calf Region No depletion  Edema (RD Assessment) Moderate  Hair Reviewed  Eyes Reviewed  Mouth Reviewed  Skin Reviewed  Nails Reviewed    Diet Order:   Diet Order             Diet NPO time specified  Diet effective now                   EDUCATION NEEDS:   Not appropriate for education at this time  Skin:  Skin Assessment: Skin Integrity Issues: Skin Integrity Issues:: Other (Comment) Other: wound to left buttocks per RN documentation on  03/08/24  Last BM:  03/10/24 (type 6)  Height:   Ht Readings from Last 1 Encounters:  03/09/24 5' 9 (1.753 m)    Weight:   Wt Readings from Last 1 Encounters:  03/11/24 (!) 139.7 kg    Ideal Body Weight:  72.7 kg  BMI:  Body mass index is 45.48 kg/m.  Estimated Nutritional Needs:   Kcal:  8399-8181  Protein:  145-182 grams  Fluid:  1.6-1.8 L    Margery ORN, RD, LDN, CDCES Registered Dietitian III Certified Diabetes Care and Education Specialist If unable to reach this RD, please use RD Inpatient group chat on secure chat between hours of 8am-4 pm daily

## 2024-03-11 NOTE — Progress Notes (Signed)
 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 He denied any fevers, chills, chest pain.     Of note, the patient was recently admitted to the hospital from 02/01/2024 to 02/05/2024 for respiratory failure due to CHF.  The patient did not require oxygen  at the time of his discharge.  His discharge weight was 286.  At his last office visit with his primary provider his weight was 291 pounds on 02/22/24.  Follow up with pulmonary on 03/04/24 office weight 29.   He was discharged home with furosemide  40 mg daily during last hospitalization   ED Course: Initial Vital Signs: afebrile and hemodynamically stable. Oxygen  saturation was 93-97% on 4 L  Significant Labs: WBC 11.0, hemoglobin 12.3, platelets 138. Sodium 141, potassium 4.1, bicarbonate 31, serum creatinine 4.15. proBNP 16,139. PCT 0.13. Lactic acid 1.9 >> 0.8. COVID-negative  Imaging Chest X-ray>>patchy opacities right lower lobe and left lower lobe  Medications Administered: furosemide  40 mg IV, Solu-Medrol  125 mg, ceftriaxone, and azithromycin. He was given albuterol  and Atrovent.    [facility] asked to admit for further workup and treatment.  He gradually became hypotensive requiring initiation of low dose Levophed (3 to 5 mcg), PCCM was consulted.  Nephrology was consulted for oliguria. Case was discussed with nephrology and it was felt the patient need to be initiated on CRRT. Subsequently, initial request was made to transfer the patient to [facility]. In order to expedite care, he is being transferred to [facility] regional ICU to critical care service where  nephrology will be consulted for continued management of his renal failure and fluid overload.   Pertinent  Medical History  As above.   Significant Hospital Events: Including procedures, antibiotic start and stop dates in addition to other pertinent events   11/12: Admitted to [facility] to [place] for treatment of Acute Decompensated HFpEF and AKI. 11/13: BP decreased, started on Levophed.  PCCM consulted.  ABX discontinued. 11/14: Transfer to [facility] for initiation of CRRT.  11/15: seizure like episode, intubated 11/16: Unable to wean; PICC line placed  Interim History / Subjective:  -- Patient remains intubated and sedated.  -- Tolerating -50cc on CRRT.   Objective    Blood pressure 119/61, pulse 70, temperature 98.4 F (36.9 C), resp. rate (!) 22, height 5' 9 (1.753 m), weight (!) 139.7 kg, SpO2 98%. CVP:  [12 mmHg-30 mmHg] 17 mmHg  Vent Mode: PRVC FiO2 (%):  [40 %-50 %] 50 % Set Rate:  [22 bmp] 22 bmp Vt Set:  [480 mL] 480 mL PEEP:  [8 cmH20] 8 cmH20 Plateau Pressure:  [20 cmH20-21 cmH20] 20 cmH20   Intake/Output Summary (Last 24 hours) at 03/11/2024 1157 Last data filed at 03/11/2024 1156 Gross per 24 hour  Intake 1887.9 ml  Output 3368 ml  Net -1480.1 ml   Filed Weights   03/09/24 0315 03/10/24 0545 03/11/24 0400  Weight: 131.8 kg (!) 139.8 kg (!) 139.7 kg    Examination: General: Intubated and sedated HENT: Supple neck, reactive pupils, EOMI  Lungs: Coarse breath sounds bilaterally  Cardiovascular: Normal S1, Normal S2, RRR Abdomen: Soft, non tender,  non distended, +BS  Extremities: Warm, +2 LE Edema.   Labs and imaging were reviewed.   Assessment and Plan  #Acute Hypoxic Respiratory Failure requiring intubation and MV 11/15 #Acute on Chronic HFpEF #Acute Renal Failure on CKD #Cardiogenic Shock #T2DM #Toxic Metabolic Encephalopathy   Neuro - Encephalopathic secondary to hypercarbia, without improvement following BiPAP. Required dexmedetomidine for  sedation to tolerate care (CRRT, BIPAP, etc...). Patient developed as seizure like episode with further encephalopathy and respiratory distress. EEG ordered. CT head unremarkable, neurology consulted.  CV - Decompensated HFpEF with anasarca and concomitant renal failure. TTE with severe LVH and diastolic dysfunction, suspect also component of pulmonary hypertension from untreated OSA/OHS. Currently attempting to mobilize fluids with CRRT, running -100/hour as of this AM with goal increase to -150cc/hr in the pm. PICC line placed, co-ox with elevated central venous sat. Appreciate input from cardiology. Continue to titrate vasopressors for goal MAP > 65 mmHg. Pulm - Intubated due to worsening encephalopathy as well as worsening respiratory failure with hypoxia and hypercapnia. CRRT for volume mobilization, and now vented, trending blood gas to ensure ventilation and oxygenation. Respiratory status precludes SBT. 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 Started on CRRT, tolerated well during the day, now running -50 ml/hour. Had problems clotting the circuit for which we started IV heparin. Endo - ICU glycemic protocol. Hem/Onc - Heparin gtt started because he continues to clot his CRRT circuit. ID - no sign of active infection, monitor fever curve.    Critical care time: 60 minutes     Darrin Barn, MD [place] [facility] 03/11/2024 12:09 PM

## 2024-03-12 ENCOUNTER — Inpatient Hospital Stay

## 2024-03-12 DIAGNOSIS — R7989 Other specified abnormal findings of blood chemistry: Secondary | ICD-10-CM

## 2024-03-12 DIAGNOSIS — J9622 Acute and chronic respiratory failure with hypercapnia: Secondary | ICD-10-CM

## 2024-03-12 LAB — COOXEMETRY PANEL
Carboxyhemoglobin: 2.2 % — ABNORMAL HIGH (ref 0.5–1.5)
Methemoglobin: <0.7 % (ref 0.0–1.5)
O2 Saturation: 82.4 %
Total hemoglobin: 11.3 g/dL — ABNORMAL LOW (ref 12.0–16.0)
Total oxygen content: 80.2 %

## 2024-03-12 LAB — RENAL FUNCTION PANEL
Albumin: 3 g/dL — ABNORMAL LOW (ref 3.5–5.0)
Albumin: 2.6 g/dL — ABNORMAL LOW (ref 3.5–5.0)
Anion gap: 7 (ref 5–15)
Anion gap: 7 (ref 5–15)
BUN: 12 mg/dL (ref 8–23)
BUN: 13 mg/dL (ref 8–23)
CO2: 25 mmol/L (ref 22–32)
CO2: 24 mmol/L (ref 22–32)
Calcium: 8.4 mg/dL — ABNORMAL LOW (ref 8.9–10.3)
Calcium: 7.9 mg/dL — ABNORMAL LOW (ref 8.9–10.3)
Chloride: 101 mmol/L (ref 98–111)
Chloride: 97 mmol/L — ABNORMAL LOW (ref 98–111)
Creatinine, Ser: 1.52 mg/dL — ABNORMAL HIGH (ref 0.61–1.24)
Creatinine, Ser: 1.17 mg/dL (ref 0.61–1.24)
GFR, Estimated: 47 mL/min — ABNORMAL LOW (ref >60)
GFR, Estimated: >60 mL/min (ref >60)
Glucose, Bld: 106 mg/dL — ABNORMAL HIGH (ref 70–99)
Glucose, Bld: 170 mg/dL — ABNORMAL HIGH (ref 70–99)
Phosphorus: 2.5 mg/dL (ref 2.5–4.6)
Phosphorus: 2.3 mg/dL — ABNORMAL LOW (ref 2.5–4.6)
Potassium: 4.1 mmol/L (ref 3.5–5.1)
Potassium: 3.8 mmol/L (ref 3.5–5.1)
Sodium: 133 mmol/L — ABNORMAL LOW (ref 135–145)
Sodium: 128 mmol/L — ABNORMAL LOW (ref 135–145)

## 2024-03-12 LAB — CBC
HCT: 35.3 % — ABNORMAL LOW (ref 39.0–52.0)
Hemoglobin: 11.2 g/dL — ABNORMAL LOW (ref 13.0–17.0)
MCH: 32.3 pg (ref 26.0–34.0)
MCHC: 31.7 g/dL (ref 30.0–36.0)
MCV: 101.7 fL — ABNORMAL HIGH (ref 80.0–100.0)
Platelets: 75 K/uL — ABNORMAL LOW (ref 150–400)
RBC: 3.47 MIL/uL — ABNORMAL LOW (ref 4.22–5.81)
RDW: 13.9 % (ref 11.5–15.5)
WBC: 15 K/uL — ABNORMAL HIGH (ref 4.0–10.5)
nRBC: 0 % (ref 0.0–0.2)

## 2024-03-12 LAB — GLUCOSE, CAPILLARY
Glucose-Capillary: 103 mg/dL — ABNORMAL HIGH (ref 70–99)
Glucose-Capillary: 115 mg/dL — ABNORMAL HIGH (ref 70–99)
Glucose-Capillary: 123 mg/dL — ABNORMAL HIGH (ref 70–99)
Glucose-Capillary: 145 mg/dL — ABNORMAL HIGH (ref 70–99)
Glucose-Capillary: 148 mg/dL — ABNORMAL HIGH (ref 70–99)
Glucose-Capillary: 152 mg/dL — ABNORMAL HIGH (ref 70–99)

## 2024-03-12 LAB — HEPATIC FUNCTION PANEL
ALT: 12 U/L (ref 0–44)
AST: 14 U/L — ABNORMAL LOW (ref 15–41)
Albumin: 3.1 g/dL — ABNORMAL LOW (ref 3.5–5.0)
Alkaline Phosphatase: 80 U/L (ref 38–126)
Bilirubin, Direct: 0.3 mg/dL — ABNORMAL HIGH (ref 0.0–0.2)
Indirect Bilirubin: 0.2 mg/dL — ABNORMAL LOW (ref 0.3–0.9)
Total Bilirubin: 0.5 mg/dL (ref 0.0–1.2)
Total Protein: 5.8 g/dL — ABNORMAL LOW (ref 6.5–8.1)

## 2024-03-12 LAB — CBC WITH DIFFERENTIAL/PLATELET
Abs Immature Granulocytes: 0.06 K/uL (ref 0.00–0.07)
Basophils Absolute: 0 K/uL (ref 0.0–0.1)
Basophils Relative: 0 %
Eosinophils Absolute: 0 K/uL (ref 0.0–0.5)
Eosinophils Relative: 0 %
HCT: 35.3 % — ABNORMAL LOW (ref 39.0–52.0)
Hemoglobin: 11.2 g/dL — ABNORMAL LOW (ref 13.0–17.0)
Immature Granulocytes: 0 %
Lymphocytes Relative: 4 %
Lymphs Abs: 0.6 K/uL — ABNORMAL LOW (ref 0.7–4.0)
MCH: 32.4 pg (ref 26.0–34.0)
MCHC: 31.7 g/dL (ref 30.0–36.0)
MCV: 102 fL — ABNORMAL HIGH (ref 80.0–100.0)
Monocytes Absolute: 0.9 K/uL (ref 0.1–1.0)
Monocytes Relative: 6 %
Neutro Abs: 13.2 K/uL — ABNORMAL HIGH (ref 1.7–7.7)
Neutrophils Relative %: 90 %
Platelets: 76 K/uL — ABNORMAL LOW (ref 150–400)
RBC: 3.46 MIL/uL — ABNORMAL LOW (ref 4.22–5.81)
RDW: 14.3 % (ref 11.5–15.5)
WBC: 14.7 K/uL — ABNORMAL HIGH (ref 4.0–10.5)
nRBC: 0 % (ref 0.0–0.2)

## 2024-03-12 LAB — MAGNESIUM: Magnesium: 2.3 mg/dL (ref 1.7–2.4)

## 2024-03-12 LAB — APTT: aPTT: 44 s — ABNORMAL HIGH (ref 24–36)

## 2024-03-12 MED ORDER — POTASSIUM PHOSPHATES 15 MMOLE/5ML IV SOLN
15.0000 mmol | Freq: Once | INTRAVENOUS | Status: AC
  Administered 2024-03-12: 15 mmol via INTRAVENOUS
  Filled 2024-03-12: qty 5

## 2024-03-12 MED ORDER — FUROSEMIDE 10 MG/ML IJ SOLN
60.0000 mg | Freq: Once | INTRAMUSCULAR | Status: DC
  Filled 2024-03-12: qty 6

## 2024-03-12 MED ORDER — PIPERACILLIN-TAZOBACTAM 3.375 G IVPB
3.3750 g | Freq: Four times a day (QID) | INTRAVENOUS | Status: DC
  Administered 2024-03-12 – 2024-03-15 (×13): 3.375 g via INTRAVENOUS
  Filled 2024-03-12 (×13): qty 50

## 2024-03-12 MED ORDER — VITAL HP 1.0 CAL PO LIQD
1000.0000 mL | ORAL | Status: DC
  Administered 2024-03-12 – 2024-03-15 (×5): 1000 mL

## 2024-03-12 MED ORDER — HEPARIN SODIUM (PORCINE) 5000 UNIT/ML IJ SOLN
5000.0000 [IU] | Freq: Three times a day (TID) | INTRAMUSCULAR | Status: DC
  Administered 2024-03-12 – 2024-03-13 (×3): 5000 [IU] via SUBCUTANEOUS
  Filled 2024-03-12 (×3): qty 1

## 2024-03-12 NOTE — Progress Notes (Addendum)
 Nutrition Follow-up  DOCUMENTATION CODES:   Obesity unspecified  INTERVENTION:   Vital HP @70ml /hr- continue increasing by 10ml/hr q 8 hours until goal rate is reached.   Free water flushes 30ml q4 hours to maintain tube patency   Propofol: 23.7 ml/hr- provides 625kcal/day   Regimen provides 2305kcal/day, 147g/day protein and 1556ml/day of free water.   Rena-vit daily via tube   Thiamine 100mg  daily per tube x 7 days   Pt remains at high refeed risk; recommend monitor potassium, magnesium and phosphorus labs daily until stable  Daily weights   NUTRITION DIAGNOSIS:   Increased nutrient needs related to acute illness (CRRT) as evidenced by estimated needs.  GOAL:   Provide needs based on ASPEN/SCCM guidelines -progressing   MONITOR:   Vent status, Labs, Weight trends, TF tolerance, I & O's, Skin  ASSESSMENT:   78 y/o male with h/o MGUS, OSA, CKD IV, DJD, HTN, CHF, kidney stones and HLD who is admitted with CHF, volume overload, cardiogenic shock and AKI requiring CRRT initiation 11/14.  Pt sedated and ventilated. OGT in place and pt is tolerating tube feeds at 31ml/hr. Pt is having bowel function. Pt remains on CRRT. Per chart, pt is down ~17lbs from admission and remains up ~13lbs from his UBW currently. Pt -5.3L on his I & Os.   Medications reviewed and include: heparin, insulin , rena-vit, protonix , thiamine, heparin, levophed, zosyn, propofol, vasopressin   Labs reviewed: Na 133(L), K 4.1 wnl, creat 1.52(H), P 2.5 wnl, Mg 2.3 wnl  Patient is currently intubated on ventilator support MV: 12.8 L/min Temp (24hrs), Avg:98.4 F (36.9 C), Min:97 F (36.1 C), Max:99.5 F (37.5 C)  Propofol: 23.7 ml/hr- provides 625kcal/day   MAP >70mmHg   UOP-   Diet Order:   Diet Order             Diet NPO time specified  Diet effective now                  EDUCATION NEEDS:   Not appropriate for education at this time  Skin:  Skin Assessment: Reviewed RN  Assessment (blister L buttocks)  Last BM:  11/18- type 6  Height:   Ht Readings from Last 1 Encounters:  03/09/24 5' 9 (1.753 m)    Weight:   Wt Readings from Last 1 Encounters:  03/12/24 (!) 136.1 kg    Ideal Body Weight:  72.7 kg  BMI:  Body mass index is 44.31 kg/m.  Estimated Nutritional Needs:   Kcal:  2365kcal/day  Protein:  >145g/day  Fluid:  1.8-2.1L/day  Dave Shams MS, RD, LDN If unable to be reached, please send secure chat to RD inpatient available from 8:00a-4:00p daily

## 2024-03-12 NOTE — Consult Note (Signed)
 PHARMACY CONSULT NOTE - ELECTROLYTES  Pharmacy Consult for Electrolyte Monitoring and Replacement   Recent Labs: Height: 5' 9 (175.3 cm) Weight: (!) 136.1 kg (300 lb 0.7 oz) IBW/kg (Calculated) : 70.7 Estimated Creatinine Clearance: 71.3 mL/min (by C-G formula based on SCr of 1.17 mg/dL). Potassium  Date Value  03/12/2024 3.8 mmol/L  07/18/2013 3.4 mEq/L (L)   Magnesium (mg/dL)  Date Value  88/81/7974 2.3   Calcium  (mg/dL)  Date Value  88/81/7974 7.9 (L)  07/18/2013 10.1   Albumin (g/dL)  Date Value  88/81/7974 2.6 (L)  07/18/2013 3.8   Phosphorus (mg/dL)  Date Value  88/81/7974 2.3 (L)   Sodium  Date Value  03/12/2024 128 mmol/L (L)  07/18/2013 138 mEq/L    Assessment  Dave Brown is a 78 y.o. male presenting with AKI requiring CRRT. PMH significant for HFpEF and COPD requiring 2L supplemental oxygen  . Pharmacy has been consulted to monitor and replace electrolytes.  Diet: VitalHP at 40 mL/hr + FWF at 30 mL every 4 hours MIVF: Primsasol CRRT fluid Pertinent medications: N/A  Goal of Therapy: Electrolytes WNL  11/18 1605: K 3.8, Phos 2.3  Plan:  Potassium Phosphate 15 mmol IV x 1  Renal function panel ordered twice daily while on CRRT Check Mg, Phos with AM labs  Thank you for allowing pharmacy to be a part of this patient's care.   Ransom Blanch PGY-1 Pharmacy Resident   - [facility]  03/12/2024 6:24 PM

## 2024-03-12 NOTE — Progress Notes (Signed)
 Nurse practitioner Lonell Moose has been informed and has reviews a 12 LED EKG as the patient has been having a irregular heart beat. 12 LED EKG shows Afib. Per NP, RN to monitor the patient.

## 2024-03-12 NOTE — Progress Notes (Signed)
 [facility] [place], [place] 03/12/24  Subjective:   Hospital day # 45 78 year old male with hypertension, heart failure with preserved ejection fraction, hyperlipidemia, stage IV CKD, obstructive sleep apnea, MGUS, chronic respiratory failure on 2 L oxygen  presented from [facility] with shortness of breath.  Transferred to [facility] for need of continuous dialysis.  Patient remains critically ill, hypotensive requiring vasopressin, norepinephrine.  He is on ventilator support FiO2 50%/PEEP 8.  Sedated with propofol. OG tube, ET tube, Foley in place. Currently requiring CRRT. Tube feeds at 40 cc/h Urine output remains low.    Objective:  Vital signs in last 24 hours:  Temp:  [97 F (36.1 C)-99.5 F (37.5 C)] 97.9 F (36.6 C) (11/18 1200) Pulse Rate:  [65-86] 75 (11/18 1300) Resp:  [16-34] 24 (11/18 1300) SpO2:  [87 %-98 %] 92 % (11/18 1440) Arterial Line BP: (102-183)/(40-63) 119/47 (11/18 1300) FiO2 (%):  [40 %-50 %] 40 % (11/18 1440) Weight:  [136.1 kg] 136.1 kg (11/18 0530)  Weight change: -3.6 kg Filed Weights   03/10/24 0545 03/11/24 0400 03/12/24 0530  Weight: (!) 139.8 kg (!) 139.7 kg (!) 136.1 kg    Intake/Output:    Intake/Output Summary (Last 24 hours) at 03/12/2024 1449 Last data filed at 03/12/2024 1400 Gross per 24 hour  Intake 2724.99 ml  Output 6185 ml  Net -3460.01 ml     Physical Exam: General: Critically ill-appearing  HEENT ET tube, OG tube in place  Pulm/lungs Ventilator assisted  CVS/Heart Tachycardic  Abdomen:  Nondistended  Extremities: Dependent edema is present  Neurologic:  sedated  Skin: Warm, dry  Access: Left femoral dialysis catheter       Basic Metabolic Panel:  Recent Labs  Lab 03/08/24 1932 03/09/24 0435 03/09/24 1757 03/10/24 0441 03/10/24 1558 03/11/24 0429 03/11/24 1604 03/12/24 0412  NA 133* 129*   < > 133* 133* 135 134* 133*  K 5.4* 4.8   < > 4.6 4.3 3.9 3.9 4.1  CL 94* 94*   < > 101  101 103 100 101  CO2 27 26   < > 23 22 25 26 25   GLUCOSE 142* 226*   < > 150* 167* 104* 99 106*  BUN 74* 60*   < > 33* 24* 18 12 12   CREATININE 6.62* 5.53*   < > 2.98* 2.24* 1.72* 1.56* 1.52*  CALCIUM  9.1 8.1*   < > 8.2* 8.0* 8.5* 8.5* 8.4*  MG 2.7* 2.5*  --  2.3  --  2.4  --  2.3  PHOS 8.6* 6.6*  6.5*   < > 3.7 3.2 2.3* 2.3* 2.5   < > = values in this interval not displayed.     CBC: Recent Labs  Lab 03/08/24 1932 03/09/24 0435 03/10/24 0441 03/11/24 0429 03/12/24 0412  WBC 17.1* 16.2* 9.7 9.9 14.7*  15.0*  NEUTROABS 15.3*  --   --   --  13.2*  HGB 12.0* 11.6* 10.7* 10.7* 11.2*  11.2*  HCT 39.8 38.6* 33.0* 33.5* 35.3*  35.3*  MCV 107.6* 106.9* 100.0 99.7 102.0*  101.7*  PLT 179 153 86* 79* 76*  75*     No results found for: HEPBSAG, HEPBSAB, HEPBIGM    Microbiology:  Recent Results (from the past 240 hours)  Blood culture (routine x 2)     Status: None   Collection Time: 03/06/24  9:29 PM   Specimen: BLOOD  Result Value Ref Range Status   Specimen Description BLOOD RIGHT ANTECUBITAL  Final  Special Requests   Final    BOTTLES DRAWN AEROBIC AND ANAEROBIC Blood Culture adequate volume   Culture   Final    NO GROWTH 5 DAYS Performed at [facility], [address] [place], [place] [address]    Report Status 03/11/2024 FINAL  Final  Resp panel by RT-PCR (RSV, Flu A&B, Covid) Anterior Nasal Swab     Status: None   Collection Time: 03/06/24  9:33 PM   Specimen: Anterior Nasal Swab  Result Value Ref Range Status   SARS Coronavirus 2 by RT PCR NEGATIVE NEGATIVE Final    Comment: (NOTE) SARS-CoV-2 target nucleic acids are NOT DETECTED.  The SARS-CoV-2 RNA is generally detectable in upper respiratory specimens during the acute phase of infection. The lowest concentration of SARS-CoV-2 viral copies this assay can detect is 138 copies/mL. A negative result does not preclude SARS-Cov-2 infection and should not be used as the sole basis for treatment  or other patient management decisions. A negative result may occur with  improper specimen collection/handling, submission of specimen other than nasopharyngeal swab, presence of viral mutation(s) within the areas targeted by this assay, and inadequate number of viral copies(<138 copies/mL). A negative result must be combined with clinical observations, patient history, and epidemiological information. The expected result is Negative.  Fact Sheet for Patients:  bloggercourse.com  Fact Sheet for Healthcare Providers:  seriousbroker.it  This test is no t yet approved or cleared by the [place]  FDA and  has been authorized for detection and/or diagnosis of SARS-CoV-2 by FDA under an Emergency Use Authorization (EUA). This EUA will remain  in effect (meaning this test can be used) for the duration of the COVID-19 declaration under Section 564(b)(1) of the Act, 21 U.S.C.section 360bbb-3(b)(1), unless the authorization is terminated  or revoked sooner.       Influenza A by PCR NEGATIVE NEGATIVE Final   Influenza B by PCR NEGATIVE NEGATIVE Final    Comment: (NOTE) The Xpert Xpress SARS-CoV-2/FLU/RSV plus assay is intended as an aid in the diagnosis of influenza from Nasopharyngeal swab specimens and should not be used as a sole basis for treatment. Nasal washings and aspirates are unacceptable for Xpert Xpress SARS-CoV-2/FLU/RSV testing.  Fact Sheet for Patients: bloggercourse.com  Fact Sheet for Healthcare Providers: seriousbroker.it  This test is not yet approved or cleared by the [place]  FDA and has been authorized for detection and/or diagnosis of SARS-CoV-2 by FDA under an Emergency Use Authorization (EUA). This EUA will remain in effect (meaning this test can be used) for the duration of the COVID-19 declaration under Section 564(b)(1) of the Act, 21 U.S.C. section  360bbb-3(b)(1), unless the authorization is terminated or revoked.     Resp Syncytial Virus by PCR NEGATIVE NEGATIVE Final    Comment: (NOTE) Fact Sheet for Patients: bloggercourse.com  Fact Sheet for Healthcare Providers: seriousbroker.it  This test is not yet approved or cleared by the [place]  FDA and has been authorized for detection and/or diagnosis of SARS-CoV-2 by FDA under an Emergency Use Authorization (EUA). This EUA will remain in effect (meaning this test can be used) for the duration of the COVID-19 declaration under Section 564(b)(1) of the Act, 21 U.S.C. section 360bbb-3(b)(1), unless the authorization is terminated or revoked.  Performed at [facility], [address] [place], [place] [address]   Blood culture (routine x 2)     Status: None   Collection Time: 03/06/24 10:55 PM   Specimen: BLOOD  Result Value Ref Range Status  Specimen Description BLOOD BLOOD RIGHT HAND  Final   Special Requests   Final    BOTTLES DRAWN AEROBIC AND ANAEROBIC Blood Culture adequate volume   Culture   Final    NO GROWTH 5 DAYS Performed at [facility], [address], [place], [place] [address]    Report Status 03/11/2024 FINAL  Final  MRSA Next Gen by PCR, Nasal     Status: None   Collection Time: 03/07/24  1:43 AM   Specimen: Nasal Mucosa; Nasal Swab  Result Value Ref Range Status   MRSA by PCR Next Gen NOT DETECTED NOT DETECTED Final    Comment: (NOTE) The GeneXpert MRSA Assay (FDA approved for NASAL specimens only), is one component of a comprehensive MRSA colonization surveillance program. It is not intended to diagnose MRSA infection nor to guide or monitor treatment for MRSA infections. Test performance is not FDA approved in patients less than 35 years old. Performed at [facility], [address] [place], [place] [address]   Respiratory (~20 pathogens) panel by PCR     Status: None   Collection Time:  03/07/24 11:18 AM   Specimen: Nasopharyngeal Swab; Respiratory  Result Value Ref Range Status   Adenovirus NOT DETECTED NOT DETECTED Final   Coronavirus 229E NOT DETECTED NOT DETECTED Final    Comment: (NOTE) The Coronavirus on the Respiratory Panel, DOES NOT test for the novel  Coronavirus (2019 nCoV)    Coronavirus HKU1 NOT DETECTED NOT DETECTED Final   Coronavirus NL63 NOT DETECTED NOT DETECTED Final   Coronavirus OC43 NOT DETECTED NOT DETECTED Final   Metapneumovirus NOT DETECTED NOT DETECTED Final   Rhinovirus / Enterovirus NOT DETECTED NOT DETECTED Final   Influenza A NOT DETECTED NOT DETECTED Final   Influenza B NOT DETECTED NOT DETECTED Final   Parainfluenza Virus 1 NOT DETECTED NOT DETECTED Final   Parainfluenza Virus 2 NOT DETECTED NOT DETECTED Final   Parainfluenza Virus 3 NOT DETECTED NOT DETECTED Final   Parainfluenza Virus 4 NOT DETECTED NOT DETECTED Final   Respiratory Syncytial Virus NOT DETECTED NOT DETECTED Final   Bordetella pertussis NOT DETECTED NOT DETECTED Final   Bordetella Parapertussis NOT DETECTED NOT DETECTED Final   Chlamydophila pneumoniae NOT DETECTED NOT DETECTED Final   Mycoplasma pneumoniae NOT DETECTED NOT DETECTED Final    Comment: Performed at [facility], [address] [place], [place] [address]  MRSA Next Gen by PCR, Nasal     Status: None   Collection Time: 03/08/24  7:23 PM   Specimen: Nasal Mucosa; Nasal Swab  Result Value Ref Range Status   MRSA by PCR Next Gen NOT DETECTED NOT DETECTED Final    Comment: (NOTE) The GeneXpert MRSA Assay (FDA approved for NASAL specimens only), is one component of a comprehensive MRSA colonization surveillance program. It is not intended to diagnose MRSA infection nor to guide or monitor treatment for MRSA infections. Test performance is not FDA approved in patients less than 9 years old. Performed at [facility], [address] [place], [place] [address]   Culture, Respiratory w  Gram Stain     Status: None (Preliminary result)   Collection Time: 03/12/24  8:21 AM   Specimen: Tracheal Aspirate; Respiratory  Result Value Ref Range Status   Specimen Description   Final    TRACHEAL ASPIRATE Performed at [facility], [address], [place], [place] [address]    Special Requests   Final    NONE Performed at [facility], [address] [place], [place] [address]    Gram Stain   Final    RARE WBC PRESENT,BOTH PMN AND MONONUCLEAR FEW GRAM POSITIVE RODS Performed at [facility], [address], [place], [place] [address]    Culture PENDING  Incomplete   Report Status PENDING  Incomplete    Coagulation Studies: No results for input(s): LABPROT, INR in the last 72 hours.   Urinalysis: No results for input(s): COLORURINE, LABSPEC, PHURINE, GLUCOSEU, HGBUR, BILIRUBINUR, KETONESUR, PROTEINUR, UROBILINOGEN, NITRITE, LEUKOCYTESUR in the last 72 hours.  Invalid input(s): APPERANCEUR    Imaging: DG Chest Port 1 View Result Date: 03/12/2024 CLINICAL DATA:  Intubated.  Respiratory failure. EXAM: PORTABLE CHEST 1 VIEW COMPARISON:  03/09/2024. FINDINGS: Stable endotracheal tube tip located approximately 2.3 cm above the carina. Right-sided PICC tip overlies the superior cavoatrial junction. Enteric tube courses below the left hemidiaphragm, beyond the field of view. Stable mild enlargement of the cardiac silhouette. Similar small bilateral pleural effusions with bibasilar opacities, favored to reflect atelectasis. Pulmonary vascular congestion with possible mild interstitial edema. No pneumothorax. Visualized osseous structures are unchanged. IMPRESSION: 1. Appropriately positioned support apparatus, as above. 2. Similar small bilateral pleural effusions with bibasilar opacities, favored to reflect atelectasis. 3. Pulmonary vascular congestion with possible mild interstitial edema. Electronically Signed   By: Harrietta Sherry M.D.   On: 03/12/2024 12:10   EEG adult Result Date: 03/11/2024 Shelton Arlin KIDD, MD     03/11/2024  4:18 PM Patient Name: Dave Brown MRN: [id number] Epilepsy Attending: Arlin KIDD Shelton Referring Physician/Provider: Malka Domino, MD Date: 03/11/2024 Duration: 26.59 mins Patient history: 78yo M with ams. EEG to evaluate for seizure Level of alertness: Comatose AEDs during EEG study: Propofol Technical aspects: This EEG study was done with scalp electrodes positioned according to the 10-20 International system of electrode placement. Electrical activity was reviewed with band pass filter of 1-70Hz , sensitivity of 7 uV/mm, display speed of 50mm/sec with a 60Hz  notched filter applied as appropriate. EEG data were recorded continuously and digitally stored.  Video monitoring was available and reviewed as appropriate. Description: EEG showed continuous generalized 3 to 6 Hz theta-delta slowing admixed with 13-15hz  beta activity distributed symmetrically and diffusely. Hyperventilation and photic stimulation were not performed.   ABNORMALITY - Continuous slow, generalized IMPRESSION: This study is suggestive of severe diffuse encephalopathy, nonspecific etiology but likely related to sedation. No seizures or epileptiform discharges were seen throughout the recording. Priyanka O Yadav     Medications:    feeding supplement (VITAL HIGH PROTEIN) 1,000 mL (03/12/24 1407)   heparin 10,000 units/ 20 mL infusion syringe 500 Units/hr (03/12/24 0045)   norepinephrine (LEVOPHED) Adult infusion 4 mcg/min (03/12/24 1400)   piperacillin-tazobactam (ZOSYN)  IV Stopped (03/12/24 1148)   prismasol BGK 4/2.5 400 mL/hr at 03/12/24 1342   prismasol BGK 4/2.5 400 mL/hr at 03/12/24 1348   prismasol BGK 4/2.5 2,500 mL/hr at 03/12/24 1316   propofol (DIPRIVAN) infusion 30 mcg/kg/min (03/12/24 1400)   vasopressin 0.03 Units/min (03/12/24 1400)    Chlorhexidine Gluconate Cloth  6 each Topical Daily   free  water  30 mL Per Tube Q4H   heparin injection (subcutaneous)  5,000 Units Subcutaneous Q8H   insulin  aspart  0-6 Units Subcutaneous Q4H   ipratropium-albuterol   3 mL Nebulization TID   multivitamin  1 tablet Per Tube QHS   mouth rinse  15 mL Mouth Rinse Q2H   pantoprazole  (PROTONIX ) IV  40 mg Intravenous QHS   sertraline   25 mg Per Tube Daily   sodium chloride  flush  10-40 mL Intracatheter Q12H   thiamine  100 mg Per Tube Daily   docusate sodium, fentaNYL  (SUBLIMAZE ) injection, heparin, heparin, ipratropium-albuterol , mouth rinse, polyethylene glycol, sodium chloride  flush  Assessment/ Plan:  78 y.o. male with    Obesity, HFpEF and obstructive sleep apnea/COPD requiring 2L supplemental oxygen , hyperlipidemia, chronic kidney disease stage IV, MGUS, hypertension  admitted on 03/08/2024 for Acute and chronic respiratory failure with hypoxia [J96.21] Hypotension [I95.9] Acute on chronic respiratory failure with hypoxia and hypercapnia (HCC) [G03.78, J96.22]  Acute kidney injury on chronic kidney disease stage IV.  Baseline creatinine of 2.5-2.8. Acute kidney injury is thought to be secondary to pneumonia and circulatory shock causing ATN.  Patient responded well to IV Lasix  yesterday.  However due to low CVP, further doses were held. Ultrafiltration goal was reduced to -50 cc/h. Plan -Will continue CRRT for now as patient is requiring pressor support.  Electrolytes and volume status are maintained.  Continue current potassium bath of 4K.  Ultrafiltration rate maintained at -50 cc/h.   Acute respiratory failure Currently requiring ventilator support.  FiO2 40%/PEEP 5 Respiratory pathogen's panel by PCR is negative.  Blood cultures from 03/06/2024 are negative.    LOS: 4 Dave Brown 11/18/20252:49 PM  [facility] [address] [place], [place] [phone]  Note: This note was prepared with Dragon dictation. Any transcription errors are unintentional

## 2024-03-12 NOTE — Progress Notes (Addendum)
 1840 PM: ICU provider has been notified: FYI, this patient's urine output has been 750 ml so far today. Seems like his urine output is slowly decressing. Current CVP 23   1721PM: CRRT - Net Hourly Fluid Balance Goal has been changed from -50/hour to -100/hour per Dr. Darrin.

## 2024-03-12 NOTE — Progress Notes (Signed)
  Progress Note  Patient Name: Dave Brown Date of Encounter: 03/12/2024 [facility] Cardiologist: Newman JINNY Lawrence, MD   Interval Summary   Remain intubated and sedated.  Norepinephrine titrated up overnight to maintain adequate blood pressure in the setting of fluid removal with CRRT.  Vital Signs Vitals:   03/12/24 0615 03/12/24 0630 03/12/24 0645 03/12/24 0700  BP:      Pulse: 66 65 66 65  Resp: (!) 22 (!) 22 (!) 22 (!) 22  Temp:    98.4 F (36.9 C)  TempSrc:    Axillary  SpO2: 94% 94% 94% 94%  Weight:      Height:        Intake/Output Summary (Last 24 hours) at 03/12/2024 0927 Last data filed at 03/12/2024 0900 Gross per 24 hour  Intake 2497.13 ml  Output 6595 ml  Net -4097.87 ml      03/12/2024    5:30 AM 03/11/2024    4:00 AM 03/10/2024    5:45 AM  Last 3 Weights  Weight (lbs) 300 lb 0.7 oz 307 lb 15.7 oz 308 lb 3.3 oz  Weight (kg) 136.1 kg 139.7 kg 139.8 kg      Telemetry/ECG  Normal sinus rhythm with PVCs- Personally Reviewed  Physical Exam  GEN: Intubated and sedated.  Does not respond to voice or tactile stimuli. Neck: Unable to assess JVP due to body habitus, positioning, and support devices. Cardiac: Distant heart sounds.  Regular rate and rhythm without murmurs. Respiratory: Coarse breath sounds anteriorly. GI: Obese but soft. MS: 1-2+ lower extremity edema noted bilaterally.  Assessment & Plan  Acute on chronic respiratory failure with hypoxia: Likely multifactorial but driven largely by volume overload in the setting of acute on chronic HFpEF and worsening CKD.  Vent requirements gradually improving.  Continue volume removal as blood pressure allows using CRRT.  Ongoing vent management per CCM.  Acute on chronic HFpEF: Limited echo from 03/07/2024 shows normal LVEF with restrictive physiology and elevated left-sided filling pressures.  Continue volume management with CRRT.  Once extubated and stable from a volume standpoint,  cardiac MRI, PYP scan, and possible right and left heart catheterization will need to be considered for further evaluation as the patient is at risk for infiltrative cardiomyopathies including amyloidosis.  Elevated troponin: Most likely due to supply-demand mismatch in the setting of acute on chronic HFpEF, acute kidney injury superimposed on chronic kidney disease, and acute on chronic respiratory failure.  No plans for invasive evaluation at this time, though if the patient makes a meaningful recovery, right and left heart catheterization will need to be considered at some point in the future.  Heparin has been deferred thus far given progressive thrombocytopenia (platelets 75 this morning, having peaked recently at 179 on 03/08/2024.   For questions or updates, please contact [facility] Please consult [email or web address] for contact info under [facility]  Signed, Lonni Hanson, MD

## 2024-03-12 NOTE — Plan of Care (Addendum)
 The patient continues to be on CRRT today. The patient's fluid removal goal has been adjusted by the provider today to -50 ml. The patient failed SVT trial today during his wakeup assessment. The patients systolic BP increased into the 160's to 180's and he started having a higher amount of oral secretions. A sputum sample was obtained by RT today. Nephrology saw the patient at the bedside and along ICU provider have decided to not give the patient lasix  today. Tube feeds continue to be adjusted per nutritionist new tube feed goal is 70 ml/hr. The patient has been placed on SQ heparin today. No pink tinged sputum has been noticed at this time and urine output has been cleared yellow. The patient is currently receiving Vasopressin, Levophed, propofol and Q8 Zosyn has been initiated. A previous note has been created about the change in heart rhythm the patient had after his wakeup assessment trial.   Problem: Education: Goal: Knowledge of General Education information will improve Description: Including pain rating scale, medication(s)/side effects and non-pharmacologic comfort measures Outcome: Progressing   Problem: Health Behavior/Discharge Planning: Goal: Ability to manage health-related needs will improve Outcome: Progressing   Problem: Clinical Measurements: Goal: Ability to maintain clinical measurements within normal limits will improve Outcome: Progressing Goal: Will remain free from infection Outcome: Progressing Goal: Diagnostic test results will improve Outcome: Progressing Goal: Respiratory complications will improve Outcome: Progressing Goal: Cardiovascular complication will be avoided Outcome: Progressing   Problem: Activity: Goal: Risk for activity intolerance will decrease Outcome: Progressing   Problem: Nutrition: Goal: Adequate nutrition will be maintained Outcome: Progressing   Problem: Coping: Goal: Level of anxiety will decrease Outcome: Progressing   Problem:  Elimination: Goal: Will not experience complications related to bowel motility Outcome: Progressing Goal: Will not experience complications related to urinary retention Outcome: Progressing   Problem: Pain Managment: Goal: General experience of comfort will improve and/or be controlled Outcome: Progressing   Problem: Skin Integrity: Goal: Risk for impaired skin integrity will decrease Outcome: Progressing   Problem: Education: Goal: Ability to describe self-care measures that may prevent or decrease complications (Diabetes Survival Skills Education) will improve Outcome: Progressing Goal: Individualized Educational Video(s) Outcome: Progressing   Problem: Coping: Goal: Ability to adjust to condition or change in health will improve Outcome: Progressing   Problem: Fluid Volume: Goal: Ability to maintain a balanced intake and output will improve Outcome: Progressing   Problem: Health Behavior/Discharge Planning: Goal: Ability to identify and utilize available resources and services will improve Outcome: Progressing Goal: Ability to manage health-related needs will improve Outcome: Progressing   Problem: Metabolic: Goal: Ability to maintain appropriate glucose levels will improve Outcome: Progressing   Problem: Nutritional: Goal: Maintenance of adequate nutrition will improve Outcome: Progressing Goal: Progress toward achieving an optimal weight will improve Outcome: Progressing   Problem: Skin Integrity: Goal: Risk for impaired skin integrity will decrease Outcome: Progressing   Problem: Tissue Perfusion: Goal: Adequacy of tissue perfusion will improve Outcome: Progressing

## 2024-03-12 NOTE — Consult Note (Signed)
 PHARMACY CONSULT NOTE - ELECTROLYTES  Pharmacy Consult for Electrolyte Monitoring and Replacement   Recent Labs: Height: 5' 9 (175.3 cm) Weight: (!) 136.1 kg (300 lb 0.7 oz) IBW/kg (Calculated) : 70.7 Estimated Creatinine Clearance: 54.9 mL/min (A) (by C-G formula based on SCr of 1.52 mg/dL (H)). Potassium  Date Value  03/12/2024 4.1 mmol/L  07/18/2013 3.4 mEq/L (L)   Magnesium (mg/dL)  Date Value  88/81/7974 2.3   Calcium  (mg/dL)  Date Value  88/81/7974 8.4 (L)  07/18/2013 10.1   Albumin (g/dL)  Date Value  88/81/7974 3.0 (L)  07/18/2013 3.8   Phosphorus (mg/dL)  Date Value  88/81/7974 2.5   Sodium  Date Value  03/12/2024 133 mmol/L (L)  07/18/2013 138 mEq/L    Assessment  Dave Brown is a 78 y.o. male presenting with AKI requiring CRRT. PMH significant for HFpEF and COPD requiring 2L supplemental oxygen  . Pharmacy has been consulted to monitor and replace electrolytes.  Diet: VitalHP at 40 mL/hr + FWF at 30 mL every 4 hours MIVF: Primsasol CRRT fluid Pertinent medications: N/A  Goal of Therapy: Electrolytes WNL  Plan:  No electrolyte replacement warranted for today Renal function panel ordered twice daily while on CRRT Check Mg, Phos with AM labs  Thank you for allowing pharmacy to be a part of this patient's care.  [name], PharmD Clinical Pharmacist 03/12/2024 7:16 AM

## 2024-03-12 NOTE — Progress Notes (Addendum)
 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 He denied any fevers, chills, chest pain.     Of note, the patient was recently admitted to the hospital from 02/01/2024 to 02/05/2024 for respiratory failure due to CHF.  The patient did not require oxygen  at the time of his discharge.  His discharge weight was 286.  At his last office visit with his primary provider his weight was 291 pounds on 02/22/24.  Follow up with pulmonary on 03/04/24 office weight 29.   He was discharged home with furosemide  40 mg daily during last hospitalization   ED Course: Initial Vital Signs: afebrile and hemodynamically stable. Oxygen  saturation was 93-97% on 4 L  Significant Labs: WBC 11.0, hemoglobin 12.3, platelets 138. Sodium 141, potassium 4.1, bicarbonate 31, serum creatinine 4.15. proBNP 16,139. PCT 0.13. Lactic acid 1.9 >> 0.8. COVID-negative  Imaging Chest X-ray>>patchy opacities right lower lobe and left lower lobe  Medications Administered: furosemide  40 mg IV, Solu-Medrol  125 mg, ceftriaxone, and azithromycin. He was given albuterol  and Atrovent.    TRH asked to admit for further workup and treatment.  He gradually became hypotensive requiring initiation of low dose Levophed (3 to 5 mcg), PCCM was consulted.  Nephrology was consulted for oliguria. Case was discussed with nephrology and it was felt the patient need to be initiated on CRRT. Subsequently, initial request was made to transfer the patient to [facility]. In order to expedite care, he is being transferred to [facility] regional ICU to critical care service where  nephrology will be consulted for continued management of his renal failure and fluid overload.   Pertinent  Medical History  As above.   Significant Hospital Events: Including procedures, antibiotic start and stop dates in addition to other pertinent events   11/12: Admitted to [facility] to [place] for treatment of Acute Decompensated HFpEF and AKI. 11/13: BP decreased, started on Levophed.  PCCM consulted.  ABX discontinued. 11/14: Transfer to [facility] for initiation of CRRT.  11/15: seizure like episode, intubated 11/16: Unable to wean; PICC line placed  Interim History / Subjective:  -- Patient remains intubated and sedated.  -- Increasing Pressor requirements now on NE at 15 mcg/min --  -100cc/hr on CRRT.  -- -4.7L Overall.   Objective    Blood pressure 119/61, pulse 71, temperature 97.9 F (36.6 C), temperature source Axillary, resp. rate (!) 22, height 5' 9 (1.753 m), weight (!) 136.1 kg, SpO2 95%. CVP:  [0 mmHg-20 mmHg] 0 mmHg  Vent Mode: PRVC FiO2 (%):  [40 %-50 %] 45 % Set Rate:  [22 bmp] 22 bmp Vt Set:  [480 mL] 480 mL PEEP:  [8 cmH20] 8 cmH20 Plateau Pressure:  [22 cmH20] 22 cmH20   Intake/Output Summary (Last 24 hours) at 03/12/2024 0942 Last data filed at 03/12/2024 0900 Gross per 24 hour  Intake 2497.13 ml  Output 6595 ml  Net -4097.87 ml   Filed Weights   03/10/24 0545 03/11/24 0400 03/12/24 0530  Weight: (!) 139.8 kg (!) 139.7 kg (!) 136.1 kg    Examination: General: Intubated and sedated HENT: Supple neck, reactive pupils, EOMI  Lungs: Coarse breath sounds bilaterally  Cardiovascular: Normal S1, Normal S2, RRR Abdomen: Soft, non tender, non distended, +BS  Extremities: Warm, +1 LE Edema.   Labs and imaging were reviewed.   Assessment and Plan  #Acute Hypoxic Respiratory Failure requiring intubation and MV 11/15 #Acute on Chronic HFpEF #Acute Renal Failure on CKD #Cardiogenic Shock #T2DM #Toxic Metabolic Encephalopathy   Neuro - Encephalopathic  secondary to hypercarbia, without improvement following BiPAP. Required dexmedetomidine for sedation to tolerate care (CRRT, BIPAP, etc...). Patient developed as seizure like episode with further encephalopathy and respiratory distress. EEG ordered. CT head unremarkable, neurology consulted.  CV - Decompensated HFpEF with anasarca and concomitant renal failure. TTE with severe LVH and diastolic dysfunction, suspect also component of pulmonary hypertension from untreated OSA/OHS. Tolerated fluid removal well and was at -100cc/hr however NE requirements are increasing. We will cut back on UF to -50cc/hr and let him equilibrate. PICC line placed, co-ox with elevated central venous sat. Appreciate input from cardiology. Continue to titrate vasopressors for goal MAP > 65 mmHg. Pulm - Intubated due to worsening encephalopathy as well as worsening respiratory failure with hypoxia and hypercapnia. CRRT for volume mobilization, and now vented, trending blood gas to ensure ventilation and oxygenation. Respiratory status precludes SBT. 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 Started on CRRT, tolerated well during the day, now running -50 ml/hour. Had problems clotting the circuit for which we started IV heparin. Endo - ICU glycemic protocol. Hem/Onc - Heparin gtt on hold. Heparin subcutaneous for px.  started because he continues to clot his CRRT circuit. Heparin through the circuit initiated.  ID - no sign of active infection, monitor fever curve. WBC uptrending and SvO2 82%. Trach aspirate pending. Blood cultures pending negative so far.    Critical care time: 50 minutes     Darrin Barn, MD [facility] 03/12/2024 9:42 AM

## 2024-03-13 DIAGNOSIS — G928 Other toxic encephalopathy: Secondary | ICD-10-CM

## 2024-03-13 DIAGNOSIS — I480 Paroxysmal atrial fibrillation: Secondary | ICD-10-CM

## 2024-03-13 DIAGNOSIS — I5031 Acute diastolic (congestive) heart failure: Secondary | ICD-10-CM

## 2024-03-13 DIAGNOSIS — N184 Chronic kidney disease, stage 4 (severe): Secondary | ICD-10-CM

## 2024-03-13 DIAGNOSIS — R7989 Other specified abnormal findings of blood chemistry: Secondary | ICD-10-CM

## 2024-03-13 LAB — RENAL FUNCTION PANEL
Albumin: 2.7 g/dL — ABNORMAL LOW (ref 3.5–5.0)
Anion gap: 11 (ref 5–15)
BUN: 13 mg/dL (ref 8–23)
CO2: 21 mmol/L — ABNORMAL LOW (ref 22–32)
Calcium: 6.8 mg/dL — ABNORMAL LOW (ref 8.9–10.3)
Chloride: 93 mmol/L — ABNORMAL LOW (ref 98–111)
Creatinine, Ser: 1.25 mg/dL — ABNORMAL HIGH (ref 0.61–1.24)
GFR, Estimated: 59 mL/min — ABNORMAL LOW (ref >60)
Glucose, Bld: 429 mg/dL — ABNORMAL HIGH (ref 70–99)
Phosphorus: 1.8 mg/dL — ABNORMAL LOW (ref 2.5–4.6)
Potassium: 3.6 mmol/L (ref 3.5–5.1)
Sodium: 125 mmol/L — ABNORMAL LOW (ref 135–145)

## 2024-03-13 LAB — GLUCOSE, CAPILLARY
Glucose-Capillary: 141 mg/dL — ABNORMAL HIGH (ref 70–99)
Glucose-Capillary: 124 mg/dL — ABNORMAL HIGH (ref 70–99)
Glucose-Capillary: 130 mg/dL — ABNORMAL HIGH (ref 70–99)
Glucose-Capillary: 108 mg/dL — ABNORMAL HIGH (ref 70–99)
Glucose-Capillary: 112 mg/dL — ABNORMAL HIGH (ref 70–99)
Glucose-Capillary: 183 mg/dL — ABNORMAL HIGH (ref 70–99)
Glucose-Capillary: 88 mg/dL (ref 70–99)

## 2024-03-13 LAB — MRSA NEXT GEN BY PCR, NASAL: MRSA by PCR Next Gen: DETECTED — AB

## 2024-03-13 LAB — APTT: aPTT: 52 s — ABNORMAL HIGH (ref 24–36)

## 2024-03-13 LAB — CBC
HCT: 34.9 % — ABNORMAL LOW (ref 39.0–52.0)
Hemoglobin: 11 g/dL — ABNORMAL LOW (ref 13.0–17.0)
MCH: 32.3 pg (ref 26.0–34.0)
MCHC: 31.5 g/dL (ref 30.0–36.0)
MCV: 102.3 fL — ABNORMAL HIGH (ref 80.0–100.0)
Platelets: 62 K/uL — ABNORMAL LOW (ref 150–400)
RBC: 3.41 MIL/uL — ABNORMAL LOW (ref 4.22–5.81)
RDW: 14.2 % (ref 11.5–15.5)
WBC: 9.8 K/uL (ref 4.0–10.5)
nRBC: 0 % (ref 0.0–0.2)

## 2024-03-13 LAB — MAGNESIUM: Magnesium: 2.3 mg/dL (ref 1.7–2.4)

## 2024-03-13 LAB — TRIGLYCERIDES: Triglycerides: 128 mg/dL (ref <150)

## 2024-03-13 LAB — HEPARIN LEVEL (UNFRACTIONATED): Heparin Unfractionated: 0.29 [IU]/mL — ABNORMAL LOW (ref 0.30–0.70)

## 2024-03-13 MED ORDER — FUROSEMIDE 10 MG/ML IJ SOLN
60.0000 mg | Freq: Once | INTRAMUSCULAR | Status: AC
  Administered 2024-03-13: 60 mg via INTRAVENOUS
  Filled 2024-03-13: qty 6

## 2024-03-13 MED ORDER — NYSTATIN 100000 UNIT/ML MT SUSP
5.0000 mL | Freq: Four times a day (QID) | OROMUCOSAL | Status: DC
  Administered 2024-03-13 – 2024-03-25 (×45): 500000 [IU] via ORAL
  Filled 2024-03-13 (×57): qty 5

## 2024-03-13 MED ORDER — AMIODARONE HCL IN DEXTROSE 360-4.14 MG/200ML-% IV SOLN
30.0000 mg/h | INTRAVENOUS | Status: DC
  Administered 2024-03-13 – 2024-03-14 (×3): 30 mg/h via INTRAVENOUS
  Filled 2024-03-13 (×3): qty 200

## 2024-03-13 MED ORDER — AMIODARONE LOAD VIA INFUSION
150.0000 mg | Freq: Once | INTRAVENOUS | Status: AC
  Administered 2024-03-13: 150 mg via INTRAVENOUS
  Filled 2024-03-13: qty 83.34

## 2024-03-13 MED ORDER — HEPARIN (PORCINE) 25000 UT/250ML-% IV SOLN
1500.0000 [IU]/h | INTRAVENOUS | Status: DC
  Administered 2024-03-13: 1550 [IU]/h via INTRAVENOUS
  Administered 2024-03-14: 1500 [IU]/h via INTRAVENOUS
  Administered 2024-03-14: 1650 [IU]/h via INTRAVENOUS
  Filled 2024-03-13 (×3): qty 250

## 2024-03-13 MED ORDER — AMIODARONE HCL IN DEXTROSE 360-4.14 MG/200ML-% IV SOLN
60.0000 mg/h | INTRAVENOUS | Status: AC
  Administered 2024-03-13 (×2): 60 mg/h via INTRAVENOUS
  Filled 2024-03-13 (×2): qty 200

## 2024-03-13 MED ORDER — MUPIROCIN 2 % EX OINT
1.0000 | TOPICAL_OINTMENT | Freq: Two times a day (BID) | CUTANEOUS | Status: AC
  Administered 2024-03-13 – 2024-03-17 (×10): 1 via NASAL
  Filled 2024-03-13: qty 22

## 2024-03-13 MED ORDER — SODIUM PHOSPHATES 45 MMOLE/15ML IV SOLN
30.0000 mmol | Freq: Once | INTRAVENOUS | Status: AC
  Administered 2024-03-13: 30 mmol via INTRAVENOUS
  Filled 2024-03-13: qty 10

## 2024-03-13 NOTE — Progress Notes (Signed)
  Progress Note  Patient Name: Dave Brown Date of Encounter: 03/13/2024 [facility] Cardiologist: Newman JINNY Lawrence, MD   Interval Summary    Patient remains intubated and sedated. Still requiring CRRT. Tele shows Pafib with controlled rates.   Vital Signs Vitals:   03/13/24 1000 03/13/24 1100 03/13/24 1108 03/13/24 1200  BP:      Pulse: 74   91  Resp: (!) 26 (!) 23  (!) 32  Temp:    98.3 F (36.8 C)  TempSrc:    Axillary  SpO2: 96%  93% 93%  Weight:    135.8 kg  Height:        Intake/Output Summary (Last 24 hours) at 03/13/2024 1224 Last data filed at 03/13/2024 1200 Gross per 24 hour  Intake 2626.63 ml  Output 4635 ml  Net -2008.37 ml      03/13/2024   12:00 PM 03/12/2024    5:30 AM 03/11/2024    4:00 AM  Last 3 Weights  Weight (lbs) 299 lb 6.2 oz 300 lb 0.7 oz 307 lb 15.7 oz  Weight (kg) 135.8 kg 136.1 kg 139.7 kg      Telemetry/ECG  NSR pAfib - Personally Reviewed  Physical Exam  GEN: intubated and sedated.   Neck: No JVD Cardiac: RRR, no murmurs, rubs, or gallops.  Respiratory: Course breath sounds. GI: Soft, nontender, non-distended  MS: trace lower leg edema  Assessment & Plan   Acute on chronic respiratory failure Cardiogenic shock - likely multifactorial but driven mainly by acute heart failure and worsening CKD - vent per IM - remains on pressors - continue fluid removal with CRRT  Acute on chronic HFpEF - limited echo 03/07/24 showed normal LVEF with restrictive physiology and elevated left-sided filling pressures - volume management with CRRT - echo showed LVEF 60-65%, severe LVH, G3DD - IV lasix  60mg  given once, can use PRN per nephrology - once extubated plan for further work-up if patient makes meaningful recovery  Elevated troponin - HS troponin elevated to the 200s - suspect supply demand mismatch in the setting of acute on chronic HFpEF, AKI on CKD, and acute respiratory failure - echo showed LVEF 60-65%, severe  LVH, G3DD - IV heparin  - plan as above  Paroxysmal Afib - tele shows intermittent Afib with controlled rates - IV heparin - CHADSVASC ( age x 2, CHF, CAD, HTN) at least 5 he will require long-term a/c - Keep Mag >2 and K>4 - TSH wnl - continue to monitor tele  For questions or updates, please contact [facility] Please consult [email or web address] for contact info under         Signed, Harlean Regula VEAR Fishman, PA-C

## 2024-03-13 NOTE — Plan of Care (Addendum)
 The Dr. Darrin has adjusted net fluid hourly output to -50. The patient was given 60 mg of Lasix . Heparin drip was initiated. Amiodarone drips was initiated as well. Q2 turns. The patient had a small BM today. CRRT machine had to be initiated as the ST 150 filter had clotted.  Problem: Education: Goal: Knowledge of General Education information will improve Description: Including pain rating scale, medication(s)/side effects and non-pharmacologic comfort measures Outcome: Progressing   Problem: Health Behavior/Discharge Planning: Goal: Ability to manage health-related needs will improve Outcome: Progressing   Problem: Clinical Measurements: Goal: Ability to maintain clinical measurements within normal limits will improve Outcome: Progressing Goal: Will remain free from infection Outcome: Progressing Goal: Diagnostic test results will improve Outcome: Progressing Goal: Respiratory complications will improve Outcome: Progressing Goal: Cardiovascular complication will be avoided Outcome: Progressing   Problem: Activity: Goal: Risk for activity intolerance will decrease Outcome: Progressing   Problem: Nutrition: Goal: Adequate nutrition will be maintained Outcome: Progressing   Problem: Coping: Goal: Level of anxiety will decrease Outcome: Progressing   Problem: Elimination: Goal: Will not experience complications related to bowel motility Outcome: Progressing Goal: Will not experience complications related to urinary retention Outcome: Progressing   Problem: Pain Managment: Goal: General experience of comfort will improve and/or be controlled Outcome: Progressing   Problem: Safety: Goal: Ability to remain free from injury will improve Outcome: Progressing   Problem: Skin Integrity: Goal: Risk for impaired skin integrity will decrease Outcome: Progressing   Problem: Education: Goal: Ability to describe self-care measures that may prevent or decrease complications  (Diabetes Survival Skills Education) will improve Outcome: Progressing Goal: Individualized Educational Video(s) Outcome: Progressing   Problem: Coping: Goal: Ability to adjust to condition or change in health will improve Outcome: Progressing   Problem: Fluid Volume: Goal: Ability to maintain a balanced intake and output will improve Outcome: Progressing   Problem: Health Behavior/Discharge Planning: Goal: Ability to identify and utilize available resources and services will improve Outcome: Progressing Goal: Ability to manage health-related needs will improve Outcome: Progressing   Problem: Metabolic: Goal: Ability to maintain appropriate glucose levels will improve Outcome: Progressing   Problem: Nutritional: Goal: Maintenance of adequate nutrition will improve Outcome: Progressing Goal: Progress toward achieving an optimal weight will improve Outcome: Progressing   Problem: Skin Integrity: Goal: Risk for impaired skin integrity will decrease Outcome: Progressing   Problem: Tissue Perfusion: Goal: Adequacy of tissue perfusion will improve Outcome: Progressing

## 2024-03-13 NOTE — Consult Note (Addendum)
 PHARMACY CONSULT NOTE - ELECTROLYTES  Pharmacy Consult for Electrolyte Monitoring and Replacement   Recent Labs: Height: 5' 9 (175.3 cm) Weight: (!) 136.1 kg (300 lb 0.7 oz) IBW/kg (Calculated) : 70.7 Estimated Creatinine Clearance: 65.7 mL/min (A) (by C-G formula based on SCr of 1.27 mg/dL (H)). Potassium  Date Value  03/13/2024 4.0 mmol/L  07/18/2013 3.4 mEq/L (L)   Magnesium (mg/dL)  Date Value  88/80/7974 2.3   Calcium  (mg/dL)  Date Value  88/80/7974 8.8 (L)  07/18/2013 10.1   Albumin (g/dL)  Date Value  88/80/7974 3.1 (L)  07/18/2013 3.8   Phosphorus (mg/dL)  Date Value  88/80/7974 2.7   Sodium  Date Value  03/13/2024 135 mmol/L  07/18/2013 138 mEq/L    Assessment  Dave Brown is a 78 y.o. male presenting with AKI requiring CRRT. PMH significant for HFpEF and COPD requiring 2L supplemental oxygen  . Pharmacy has been consulted to monitor and replace electrolytes.  Diet: VitalHP at 40 mL/hr + FWF at 30 mL every 4 hours MIVF: Primsasol CRRT fluid Pertinent medications: furosemide  60 mg IV x 1  Goal of Therapy: Electrolytes WNL  Plan:  No electrolyte replacement warranted for today Renal function panel ordered twice daily while on CRRT Check Mg, Phos with AM labs  Thank you for allowing pharmacy to be a part of this patient's care.  Adriana Bolster, PharmD, BCPS 03/13/2024 7:21 AM

## 2024-03-13 NOTE — Consult Note (Signed)
 PHARMACY - ANTICOAGULATION CONSULT NOTE  Pharmacy Consult for heparin infusion Indication: atrial fibrillation  No Known Allergies  Patient Measurements: Height: 5' 9 (175.3 cm) Weight: (!) 136.1 kg (300 lb 0.7 oz) IBW/kg (Calculated) : 70.7  Vital Signs: Temp: 97.5 F (36.4 C) (11/19 0800) Temp Source: Axillary (11/19 0800) Pulse Rate: 78 (11/19 0900)  Labs: Recent Labs    03/11/24 0429 03/11/24 1343 03/11/24 1604 03/12/24 0412 03/12/24 1605 03/13/24 0346  HGB 10.7*  --   --  11.2*  11.2*  --  11.0*  HCT 33.5*  --   --  35.3*  35.3*  --  34.9*  PLT 79*  --   --  76*  75*  --  62*  APTT  --   --   --  44*  --  52*  HEPARINUNFRC 0.25* 0.30  --   --   --   --   CREATININE 1.72*  --    < > 1.52* 1.17 1.27*   < > = values in this interval not displayed.    Estimated Creatinine Clearance: 65.7 mL/min (A) (by C-G formula based on SCr of 1.27 mg/dL (H)).   Medical History: Past Medical History:  Diagnosis Date   CHF (congestive heart failure) (HCC)    Degenerative joint disease    High cholesterol    Hypertension    Kidney stones    Obesity    Respiratory failure with hypoxia (HCC)    Sleep apnea     Medications:  No anticoagulation PTA  Assessment: Patient is a 78 y/o male with a PMH significant for HFpEF, COPD, and acute on chronic hypoxic respiratory failure, currently with AKI requiring CRRT.  Pharmacy is consulted for initiation and management of a heparin infusion ISO atrial fibrillation  Baseline labs WNL: Hbg 11.6, PLT 153, INR 1.0, aPTT 29  Goal of Therapy:  Heparin level 0.3-0.7 units/ml Monitor platelets by anticoagulation protocol: Yes  Plan:  Per MD: no heparin bolus resume heparin infusion at 1,550 units/hr (rate selected given history of response to IV heparin on this admission) check heparin level in 8 hrs after initiation CBC daily while on heparin  Adriana Bolster, PharmD, BCPS 03/13/2024 9:56 AM

## 2024-03-13 NOTE — Progress Notes (Addendum)
 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 He denied any fevers, chills, chest pain.     Of note, the patient was recently admitted to the hospital from 02/01/2024 to 02/05/2024 for respiratory failure due to CHF.  The patient did not require oxygen  at the time of his discharge.  His discharge weight was 286.  At his last office visit with his primary provider his weight was 291 pounds on 02/22/24.  Follow up with pulmonary on 03/04/24 office weight 29.   He was discharged home with furosemide  40 mg daily during last hospitalization   ED Course: Initial Vital Signs: afebrile and hemodynamically stable. Oxygen  saturation was 93-97% on 4 L  Significant Labs: WBC 11.0, hemoglobin 12.3, platelets 138. Sodium 141, potassium 4.1, bicarbonate 31, serum creatinine 4.15. proBNP 16,139. PCT 0.13. Lactic acid 1.9 >> 0.8. COVID-negative  Imaging Chest X-ray>>patchy opacities right lower lobe and left lower lobe  Medications Administered: furosemide  40 mg IV, Solu-Medrol  125 mg, ceftriaxone, and azithromycin. He was given albuterol  and Atrovent.    TRH asked to admit for further workup and treatment.  He gradually became hypotensive requiring initiation of low dose Levophed (3 to 5 mcg), PCCM was consulted.  Nephrology was consulted for oliguria. Case was discussed with nephrology and it was felt the patient need to be initiated on CRRT. Subsequently, initial request was made to transfer the patient to [facility]. In order to expedite care, he is being transferred to [facility] regional ICU to critical care service where  nephrology will be consulted for continued management of his renal failure and fluid overload.   Pertinent  Medical History  As above.   Significant Hospital Events: Including procedures, antibiotic start and stop dates in addition to other pertinent events   11/12: Admitted to [facility] to [place] for treatment of Acute Decompensated HFpEF and AKI. 11/13: BP decreased, started on Levophed.  PCCM consulted.  ABX discontinued. 11/14: Transfer to [facility] for initiation of CRRT.  11/15: seizure like episode, intubated 11/16: Unable to wean; PICC line placed  Interim History / Subjective:  -- Patient remains intubated and sedated.  -- Pressors stable at 4mcg/min.  --  -100cc/hr on CRRT. CVP 17 this am.   Objective    Blood pressure 119/61, pulse 78, temperature (!) 97.5 F (36.4 C), temperature source Axillary, resp. rate (!) 26, height 5' 9 (1.753 m), weight (!) 136.1 kg, SpO2 96%. CVP:  [3 mmHg-35 mmHg] 17 mmHg  Vent Mode: PRVC FiO2 (%):  [40 %] 40 % Set Rate:  [22 bmp] 22 bmp Vt Set:  [480 mL] 480 mL PEEP:  [5 cmH20-8 cmH20] 5 cmH20 Plateau Pressure:  [15 cmH20-19 cmH20] 15 cmH20   Intake/Output Summary (Last 24 hours) at 03/13/2024 0934 Last data filed at 03/13/2024 0900 Gross per 24 hour  Intake 2754.5 ml  Output 4799 ml  Net -2044.5 ml   Filed Weights   03/10/24 0545 03/11/24 0400 03/12/24 0530  Weight: (!) 139.8 kg (!) 139.7 kg (!) 136.1 kg    Examination: General: Intubated and sedated HENT: Supple neck, reactive pupils, EOMI  Lungs:  Coarse breath sounds bilaterally  Cardiovascular: Normal S1, Normal S2, RRR Abdomen: Soft, non tender, non distended, +BS  Extremities: Warm, Trace lower ext edema.   Labs and imaging were reviewed.   Assessment and Plan  #Acute Hypoxic Respiratory Failure requiring intubation and MV 11/15 #Acute on Chronic HFpEF #Acute Renal Failure on CKD #Cardiogenic Shock #T2DM #Toxic Metabolic Encephalopathy #Paroxysmal A.fib    Neuro -  Encephalopathic secondary to hypercarbia, without improvement following BiPAP. Required dexmedetomidine for sedation to tolerate care (CRRT, BIPAP, etc...). Patient developed as seizure like episode with further encephalopathy and respiratory distress. EEG Normal. CT head unremarkable. Possibole due  to hypercapnic resp failure. SAT this am.  CV - Decompensated HFpEF with anasarca and concomitant renal failure. TTE with severe LVH and diastolic dysfunction, suspect also component of pulmonary hypertension from untreated OSA/OHS. Tolerating CRRT UF -100cc/hr well. Trial Lasix  60mg  IV once today. Appreciate input from cardiology. Continue to titrate vasopressors for goal MAP > 65 mmHg. Pulm - Intubated due to worsening encephalopathy as well as worsening respiratory failure with hypoxia and hypercapnia. CRRT for volume mobilization, and now vented, trending blood gas to ensure ventilation and oxygenation. SBT this AM.  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 Started on CRRT, tolerated well during the day, now running -100 ml/hour.  Endo - ICU glycemic protocol. Hem/Onc - Restart heparin drip for A.fib. H&H stable being mindful platelets are 62.  ID -  WBC uptrending and SvO2 82%. Trach aspirate pending. Blood cultures pending negative so far. Sputum culture with oral flora. Started on Zosyn for yesterday for increased thick secretions.    Critical care time: 40 minutes     Darrin Barn, MD [facility] 03/13/2024 9:34 AM

## 2024-03-13 NOTE — Plan of Care (Signed)
  Problem: Nutrition: Goal: Adequate nutrition will be maintained Outcome: Progressing   Problem: Elimination: Goal: Will not experience complications related to bowel motility Outcome: Progressing Goal: Will not experience complications related to urinary retention Outcome: Progressing   Problem: Safety: Goal: Ability to remain free from injury will improve Outcome: Progressing   

## 2024-03-13 NOTE — Progress Notes (Addendum)
 [facility] [place], [place] 03/13/24  Subjective:   Hospital day # 14 78 year old male with hypertension, heart failure with preserved ejection fraction, hyperlipidemia, stage IV CKD, obstructive sleep apnea, MGUS, chronic respiratory failure on 2 L oxygen  presented from [facility] with shortness of breath.  Transferred to [facility] for need of continuous dialysis.  Patient remains critically ill Pressor support- norepinephrine .  Ventilator support FiO2 40%/PEEP 8.   Sedated with propofol . (Weaning) OG tube, ET tube, Foley in place. Remains on CRRT, UF -151ml/hr Tube feeds at 50 cc/h Urine output    Objective:  Vital signs in last 24 hours:  Temp:  [91.9 F (33.3 C)-97.9 F (36.6 C)] 97.5 F (36.4 C) (11/19 0800) Pulse Rate:  [56-93] 78 (11/19 0900) Resp:  [19-26] 26 (11/19 0900) SpO2:  [84 %-100 %] 93 % (11/19 1108) Arterial Line BP: (89-173)/(38-71) 141/55 (11/19 0900) FiO2 (%):  [40 %] 40 % (11/19 1108)  Weight change:  Filed Weights   03/10/24 0545 03/11/24 0400 03/12/24 0530  Weight: (!) 139.8 kg (!) 139.7 kg (!) 136.1 kg    Intake/Output:    Intake/Output Summary (Last 24 hours) at 03/13/2024 1118 Last data filed at 03/13/2024 1100 Gross per 24 hour  Intake 2675.13 ml  Output 4692 ml  Net -2016.87 ml     Physical Exam: General: Critically ill-appearing  HEENT ET tube, OG tube in place  Pulm/lungs Ventilator assisted  CVS/Heart Tachycardic  Abdomen:  Nondistended  Extremities: Dependent edema is present  Neurologic:  sedated  Skin: Warm, dry  Access: Left femoral dialysis catheter       Basic Metabolic Panel:  Recent Labs  Lab 03/09/24 0435 03/09/24 1757 03/10/24 0441 03/10/24 1558 03/11/24 0429 03/11/24 1604 03/12/24 0412 03/12/24 1605 03/13/24 0346  NA 129*   < > 133*   < > 135 134* 133* 128* 135  K 4.8   < > 4.6   < > 3.9 3.9 4.1 3.8 4.0  CL 94*   < > 101   < > 103 100 101 97* 100  CO2 26   < > 23   <  > 25 26 25 24 26   GLUCOSE 226*   < > 150*   < > 104* 99 106* 170* 147*  BUN 60*   < > 33*   < > 18 12 12 13 14   CREATININE 5.53*   < > 2.98*   < > 1.72* 1.56* 1.52* 1.17 1.27*  CALCIUM  8.1*   < > 8.2*   < > 8.5* 8.5* 8.4* 7.9* 8.8*  MG 2.5*  --  2.3  --  2.4  --  2.3  --  2.3  PHOS 6.6*  6.5*   < > 3.7   < > 2.3* 2.3* 2.5 2.3* 2.7   < > = values in this interval not displayed.     CBC: Recent Labs  Lab 03/08/24 1932 03/09/24 0435 03/10/24 0441 03/11/24 0429 03/12/24 0412 03/13/24 0346  WBC 17.1* 16.2* 9.7 9.9 14.7*  15.0* 9.8  NEUTROABS 15.3*  --   --   --  13.2*  --   HGB 12.0* 11.6* 10.7* 10.7* 11.2*  11.2* 11.0*  HCT 39.8 38.6* 33.0* 33.5* 35.3*  35.3* 34.9*  MCV 107.6* 106.9* 100.0 99.7 102.0*  101.7* 102.3*  PLT 179 153 86* 79* 76*  75* 62*     No results found for: HEPBSAG, HEPBSAB, HEPBIGM    Microbiology:  Recent Results (from the past 240  hours)  Blood culture (routine x 2)     Status: None   Collection Time: 03/06/24  9:29 PM   Specimen: BLOOD  Result Value Ref Range Status   Specimen Description BLOOD RIGHT ANTECUBITAL  Final   Special Requests   Final    BOTTLES DRAWN AEROBIC AND ANAEROBIC Blood Culture adequate volume   Culture   Final    NO GROWTH 5 DAYS Performed at [facility], [address], [place], [place] [address]    Report Status 03/11/2024 FINAL  Final  Resp panel by RT-PCR (RSV, Flu A&B, Covid) Anterior Nasal Swab     Status: None   Collection Time: 03/06/24  9:33 PM   Specimen: Anterior Nasal Swab  Result Value Ref Range Status   SARS Coronavirus 2 by RT PCR NEGATIVE NEGATIVE Final    Comment: (NOTE) SARS-CoV-2 target nucleic acids are NOT DETECTED.  The SARS-CoV-2 RNA is generally detectable in upper respiratory specimens during the acute phase of infection. The lowest concentration of SARS-CoV-2 viral copies this assay can detect is 138 copies/mL. A negative result does not preclude SARS-Cov-2 infection and should not  be used as the sole basis for treatment or other patient management decisions. A negative result may occur with  improper specimen collection/handling, submission of specimen other than nasopharyngeal swab, presence of viral mutation(s) within the areas targeted by this assay, and inadequate number of viral copies(<138 copies/mL). A negative result must be combined with clinical observations, patient history, and epidemiological information. The expected result is Negative.  Fact Sheet for Patients:  bloggercourse.com  Fact Sheet for Healthcare Providers:  seriousbroker.it  This test is no t yet approved or cleared by the [place]  FDA and  has been authorized for detection and/or diagnosis of SARS-CoV-2 by FDA under an Emergency Use Authorization (EUA). This EUA will remain  in effect (meaning this test can be used) for the duration of the COVID-19 declaration under Section 564(b)(1) of the Act, 21 U.S.C.section 360bbb-3(b)(1), unless the authorization is terminated  or revoked sooner.       Influenza A by PCR NEGATIVE NEGATIVE Final   Influenza B by PCR NEGATIVE NEGATIVE Final    Comment: (NOTE) The Xpert Xpress SARS-CoV-2/FLU/RSV plus assay is intended as an aid in the diagnosis of influenza from Nasopharyngeal swab specimens and should not be used as a sole basis for treatment. Nasal washings and aspirates are unacceptable for Xpert Xpress SARS-CoV-2/FLU/RSV testing.  Fact Sheet for Patients: bloggercourse.com  Fact Sheet for Healthcare Providers: seriousbroker.it  This test is not yet approved or cleared by the [place]  FDA and has been authorized for detection and/or diagnosis of SARS-CoV-2 by FDA under an Emergency Use Authorization (EUA). This EUA will remain in effect (meaning this test can be used) for the duration of the COVID-19 declaration under Section  564(b)(1) of the Act, 21 U.S.C. section 360bbb-3(b)(1), unless the authorization is terminated or revoked.     Resp Syncytial Virus by PCR NEGATIVE NEGATIVE Final    Comment: (NOTE) Fact Sheet for Patients: bloggercourse.com  Fact Sheet for Healthcare Providers: seriousbroker.it  This test is not yet approved or cleared by the [place]  FDA and has been authorized for detection and/or diagnosis of SARS-CoV-2 by FDA under an Emergency Use Authorization (EUA). This EUA will remain in effect (meaning this test can be used) for the duration of the COVID-19 declaration under Section 564(b)(1) of the Act, 21 U.S.C. section 360bbb-3(b)(1), unless the authorization is terminated or revoked.  Performed  at [facility], [address], [place], [place] [address]   Blood culture (routine x 2)     Status: None   Collection Time: 03/06/24 10:55 PM   Specimen: BLOOD  Result Value Ref Range Status   Specimen Description BLOOD BLOOD RIGHT HAND  Final   Special Requests   Final    BOTTLES DRAWN AEROBIC AND ANAEROBIC Blood Culture adequate volume   Culture   Final    NO GROWTH 5 DAYS Performed at [facility], [address] [place], [place] [address]    Report Status 03/11/2024 FINAL  Final  MRSA Next Gen by PCR, Nasal     Status: None   Collection Time: 03/07/24  1:43 AM   Specimen: Nasal Mucosa; Nasal Swab  Result Value Ref Range Status   MRSA by PCR Next Gen NOT DETECTED NOT DETECTED Final    Comment: (NOTE) The GeneXpert MRSA Assay (FDA approved for NASAL specimens only), is one component of a comprehensive MRSA colonization surveillance program. It is not intended to diagnose MRSA infection nor to guide or monitor treatment for MRSA infections. Test performance is not FDA approved in patients less than 59 years old. Performed at [facility], [address] [place], [place] [address]   Respiratory (~20 pathogens) panel by PCR      Status: None   Collection Time: 03/07/24 11:18 AM   Specimen: Nasopharyngeal Swab; Respiratory  Result Value Ref Range Status   Adenovirus NOT DETECTED NOT DETECTED Final   Coronavirus 229E NOT DETECTED NOT DETECTED Final    Comment: (NOTE) The Coronavirus on the Respiratory Panel, DOES NOT test for the novel  Coronavirus (2019 nCoV)    Coronavirus HKU1 NOT DETECTED NOT DETECTED Final   Coronavirus NL63 NOT DETECTED NOT DETECTED Final   Coronavirus OC43 NOT DETECTED NOT DETECTED Final   Metapneumovirus NOT DETECTED NOT DETECTED Final   Rhinovirus / Enterovirus NOT DETECTED NOT DETECTED Final   Influenza A NOT DETECTED NOT DETECTED Final   Influenza B NOT DETECTED NOT DETECTED Final   Parainfluenza Virus 1 NOT DETECTED NOT DETECTED Final   Parainfluenza Virus 2 NOT DETECTED NOT DETECTED Final   Parainfluenza Virus 3 NOT DETECTED NOT DETECTED Final   Parainfluenza Virus 4 NOT DETECTED NOT DETECTED Final   Respiratory Syncytial Virus NOT DETECTED NOT DETECTED Final   Bordetella pertussis NOT DETECTED NOT DETECTED Final   Bordetella Parapertussis NOT DETECTED NOT DETECTED Final   Chlamydophila pneumoniae NOT DETECTED NOT DETECTED Final   Mycoplasma pneumoniae NOT DETECTED NOT DETECTED Final    Comment: Performed at [facility], [address], [place], [place] [address]  MRSA Next Gen by PCR, Nasal     Status: None   Collection Time: 03/08/24  7:23 PM   Specimen: Nasal Mucosa; Nasal Swab  Result Value Ref Range Status   MRSA by PCR Next Gen NOT DETECTED NOT DETECTED Final    Comment: (NOTE) The GeneXpert MRSA Assay (FDA approved for NASAL specimens only), is one component of a comprehensive MRSA colonization surveillance program. It is not intended to diagnose MRSA infection nor to guide or monitor treatment for MRSA infections. Test performance is not FDA approved in patients less than 7 years old. Performed at [facility], [address] [place], [place]  [address]   Culture, Respiratory w Gram Stain     Status: None (Preliminary result)   Collection Time: 03/12/24  8:21 AM   Specimen: Tracheal Aspirate; Respiratory  Result Value Ref  Range Status   Specimen Description   Final    TRACHEAL ASPIRATE Performed at [facility], [address] [place], [place] [address]    Special Requests   Final    NONE Performed at [facility], [address] [place], [place] [address]    Gram Stain   Final    RARE WBC PRESENT,BOTH PMN AND MONONUCLEAR FEW GRAM POSITIVE RODS    Culture   Final    MODERATE STAPHYLOCOCCUS AUREUS CULTURE REINCUBATED FOR BETTER GROWTH Performed at [facility], [address] [place], [place] [address]    Report Status PENDING  Incomplete    Coagulation Studies: No results for input(s): LABPROT, INR in the last 72 hours.   Urinalysis: No results for input(s): COLORURINE, LABSPEC, PHURINE, GLUCOSEU, HGBUR, BILIRUBINUR, KETONESUR, PROTEINUR, UROBILINOGEN, NITRITE, LEUKOCYTESUR in the last 72 hours.  Invalid input(s): APPERANCEUR    Imaging: DG Chest Port 1 View Result Date: 03/12/2024 CLINICAL DATA:  Intubated.  Respiratory failure. EXAM: PORTABLE CHEST 1 VIEW COMPARISON:  03/09/2024. FINDINGS: Stable endotracheal tube tip located approximately 2.3 cm above the carina. Right-sided PICC tip overlies the superior cavoatrial junction. Enteric tube courses below the left hemidiaphragm, beyond the field of view. Stable mild enlargement of the cardiac silhouette. Similar small bilateral pleural effusions with bibasilar opacities, favored to reflect atelectasis. Pulmonary vascular congestion with possible mild interstitial edema. No pneumothorax. Visualized osseous structures are unchanged. IMPRESSION: 1. Appropriately positioned support apparatus, as above. 2. Similar small bilateral pleural effusions with bibasilar opacities, favored to reflect atelectasis. 3. Pulmonary  vascular congestion with possible mild interstitial edema. Electronically Signed   By: Harrietta Sherry M.D.   On: 03/12/2024 12:10   EEG adult Result Date: 03/11/2024 Shelton Arlin KIDD, MD     03/11/2024  4:18 PM Patient Name: Dave Brown MRN: [id number] Epilepsy Attending: Arlin KIDD Shelton Referring Physician/Provider: Malka Domino, MD Date: 03/11/2024 Duration: 26.59 mins Patient history: 78yo M with ams. EEG to evaluate for seizure Level of alertness: Comatose AEDs during EEG study: Propofol  Technical aspects: This EEG study was done with scalp electrodes positioned according to the 10-20 International system of electrode placement. Electrical activity was reviewed with band pass filter of 1-70Hz , sensitivity of 7 uV/mm, display speed of 12mm/sec with a 60Hz  notched filter applied as appropriate. EEG data were recorded continuously and digitally stored.  Video monitoring was available and reviewed as appropriate. Description: EEG showed continuous generalized 3 to 6 Hz theta-delta slowing admixed with 13-15hz  beta activity distributed symmetrically and diffusely. Hyperventilation and photic stimulation were not performed.   ABNORMALITY - Continuous slow, generalized IMPRESSION: This study is suggestive of severe diffuse encephalopathy, nonspecific etiology but likely related to sedation. No seizures or epileptiform discharges were seen throughout the recording. Priyanka O Yadav     Medications:    feeding supplement (VITAL HIGH PROTEIN) 50 mL/hr at 03/13/24 1100   heparin  10,000 units/ 20 mL infusion syringe 500 Units/hr (03/13/24 0700)   heparin  1,550 Units/hr (03/13/24 1100)   norepinephrine  (LEVOPHED ) Adult infusion Stopped (03/13/24 0955)   piperacillin -tazobactam (ZOSYN )  IV Stopped (03/13/24 0600)   prismasol  BGK 4/2.5 400 mL/hr at 03/12/24 1342   prismasol  BGK 4/2.5 400 mL/hr at 03/12/24 1348   prismasol  BGK 4/2.5 2,500 mL/hr at 03/13/24 1007   propofol  (DIPRIVAN ) infusion Stopped  (03/13/24 0836)    Chlorhexidine  Gluconate Cloth  6 each Topical Daily   free water   30 mL Per Tube Q4H   insulin  aspart  0-6 Units  Subcutaneous Q4H   ipratropium-albuterol   3 mL Nebulization TID   multivitamin  1 tablet Per Tube QHS   nystatin   5 mL Oral QID   mouth rinse  15 mL Mouth Rinse Q2H   pantoprazole  (PROTONIX ) IV  40 mg Intravenous QHS   sertraline   25 mg Per Tube Daily   sodium chloride  flush  10-40 mL Intracatheter Q12H   thiamine   100 mg Per Tube Daily   docusate sodium , fentaNYL  (SUBLIMAZE ) injection, heparin , heparin , ipratropium-albuterol , mouth rinse, polyethylene glycol, sodium chloride  flush  Assessment/ Plan:  78 y.o. male with    Obesity, HFpEF and obstructive sleep apnea/COPD requiring 2L supplemental oxygen , hyperlipidemia, chronic kidney disease stage IV, MGUS, hypertension  admitted on 03/08/2024 for Acute and chronic respiratory failure with hypoxia [J96.21] Hypotension [I95.9] Acute on chronic respiratory failure with hypoxia and hypercapnia (HCC) [G03.78, J96.22]  Acute kidney injury on chronic kidney disease stage IV.  Baseline creatinine of 2.5-2.8. Acute kidney injury is thought to be secondary to pneumonia and circulatory shock causing ATN.  Patient responded well to IV Lasix  yesterday.  However due to low CVP, further doses were held. Ultrafiltration goal was reduced to -50 cc/h.  Plan -Will continue CRRT, agree with increased UF. - Can also consider IV Furosemide  60mg , patient responded well to this.  Pressor support per primary team.  Electrolytes and volume status are maintained.  Continue potassium bath of 4K.   Acute respiratory failure Currently requiring ventilator support.  FiO2 40%/PEEP 5 Respiratory pathogen's panel by PCR is negative.  Blood cultures from 03/06/2024 are negative.    LOS: 5 Faith Harris 11/19/202511:18 AM  [address] [place], [place] [phone]

## 2024-03-13 NOTE — Consult Note (Signed)
 PHARMACY CONSULT NOTE - ELECTROLYTES  Pharmacy Consult for Electrolyte Monitoring and Replacement   Recent Labs: Height: 5' 9 (175.3 cm) Weight: 135.8 kg (299 lb 6.2 oz) IBW/kg (Calculated) : 70.7 Estimated Creatinine Clearance: 66.6 mL/min (A) (by C-G formula based on SCr of 1.25 mg/dL (H)). Potassium  Date Value  03/13/2024 3.6 mmol/L  07/18/2013 3.4 mEq/L (L)   Magnesium (mg/dL)  Date Value  88/80/7974 2.3   Calcium  (mg/dL)  Date Value  88/80/7974 6.8 (L)  07/18/2013 10.1   Albumin (g/dL)  Date Value  88/80/7974 2.7 (L)  07/18/2013 3.8   Phosphorus (mg/dL)  Date Value  88/80/7974 1.8 (L)   Sodium  Date Value  03/13/2024 125 mmol/L (L)  07/18/2013 138 mEq/L    Assessment  Dave Brown is a 78 y.o. male presenting with AKI requiring CRRT. PMH significant for HFpEF and COPD requiring 2L supplemental oxygen  . Pharmacy has been consulted to monitor and replace electrolytes.  Diet: VitalHP at 40 mL/hr + FWF at 30 mL every 4 hours MIVF: Primsasol CRRT fluid Pertinent medications: furosemide  60 mg IV x 1  Goal of Therapy: Electrolytes WNL  Plan:  NaPhos 30mmol IV x 1 Renal function panel ordered twice daily while on CRRT Check Mg, Phos with AM labs  Thank you for allowing pharmacy to be a part of this patient's care.  Sherlie Boyum A Solan Vosler, PharmD Clinical Pharmacist 03/13/2024 5:23 PM

## 2024-03-13 NOTE — Consult Note (Signed)
 PHARMACY - ANTICOAGULATION CONSULT NOTE  Pharmacy Consult for heparin infusion Indication: atrial fibrillation  No Known Allergies  Patient Measurements: Height: 5' 9 (175.3 cm) Weight: 135.8 kg (299 lb 6.2 oz) IBW/kg (Calculated) : 70.7  Vital Signs: Temp: 98.7 F (37.1 C) (11/19 1400) Temp Source: Axillary (11/19 1400) Pulse Rate: 88 (11/19 1600)  Labs: Recent Labs    03/11/24 0429 03/11/24 1343 03/11/24 1604 03/12/24 0412 03/12/24 1605 03/13/24 0346 03/13/24 1603  HGB 10.7*  --   --  11.2*  11.2*  --  11.0*  --   HCT 33.5*  --   --  35.3*  35.3*  --  34.9*  --   PLT 79*  --   --  76*  75*  --  62*  --   APTT  --   --   --  44*  --  52*  --   HEPARINUNFRC 0.25* 0.30  --   --   --   --  0.29*  CREATININE 1.72*  --    < > 1.52* 1.17 1.27*  --    < > = values in this interval not displayed.    Estimated Creatinine Clearance: 65.6 mL/min (A) (by C-G formula based on SCr of 1.27 mg/dL (H)).   Medical History: Past Medical History:  Diagnosis Date   CHF (congestive heart failure) (HCC)    Degenerative joint disease    High cholesterol    Hypertension    Kidney stones    Obesity    Respiratory failure with hypoxia (HCC)    Sleep apnea     Medications:  No anticoagulation PTA  Assessment: Patient is a 78 y/o male with a PMH significant for HFpEF, COPD, and acute on chronic hypoxic respiratory failure, currently with AKI requiring CRRT.  Pharmacy is consulted for initiation and management of a heparin infusion ISO atrial fibrillation  Baseline labs WNL: Hbg 11.6, PLT 153, INR 1.0, aPTT 29  Goal of Therapy:  Heparin level 0.3-0.7 units/ml Monitor platelets by anticoagulation protocol: Yes  Plan:  11/19@1603 : HL 0.29, subtherapeutic Per MD: no heparin bolus Increase heparin infusion at 1650 units/hr check heparin level in 8 hrs after rate change CBC daily while on heparin  Muadh Creasy A Kalmen Lollar, PharmD Clinical Pharmacist 03/13/2024 4:37 PM

## 2024-03-13 NOTE — TOC Initial Note (Signed)
 Transition of Care [facility]) - Initial/Assessment Note    Patient Details  Name: Dave Brown MRN: [id number] Date of Birth: November 06, 1945  [facility] CM/SW Contact:    Nathanael CHRISTELLA Ring, RN Phone Number: 03/13/2024, 1:52 PM  Clinical Narrative:                 Patient currently in the ICU intubated, sedation is currently off, he is getting continuous dialysis.  Daughter, [name] is at the bedside, she reports that patient is from home in [place], his son [name] lives with him.  He was independent and driving before this admission.  She takes him to his doctors appointments and will be available for transportation home at discharge.  He was recently set up with home oxygen , just on 11/10- when he discharged from the hospital in October he was not on any oxygen .  He had a CPAP machine some years ago but he stopped using it and has been without it for several years.  He had also declined HH services on his last admission but his daughter says that they would really like home health this time.  TOC will cont to follow.   Expected Discharge Plan: Home w Home Health Services Barriers to Discharge: Continued Medical Work up   Patient Goals and CMS Choice Patient states their goals for this hospitalization and ongoing recovery are:: Daughter would like for him to get better and be able to go back home.          Expected Discharge Plan and Services   Discharge Planning Services: CM Consult   Living arrangements for the past 2 months: Single Family Home                                      Prior Living Arrangements/Services Living arrangements for the past 2 months: Single Family Home Lives with:: Adult Children Patient language and need for interpreter reviewed:: Yes Do you feel safe going back to the place where you live?: Yes      Need for Family Participation in Patient Care: Yes (Comment) Care giver support system in place?: Yes (comment) Current home services: DME  (Walker/ rollator, cane, oxygen ) Criminal Activity/Legal Involvement Pertinent to Current Situation/Hospitalization: No - Comment as needed  Activities of Daily Living      Permission Sought/Granted                  Emotional Assessment Appearance:: Appears stated age Attitude/Demeanor/Rapport: Unable to Assess Affect (typically observed): Unable to Assess   Alcohol / Substance Use: Not Applicable Psych Involvement: No (comment)  Admission diagnosis:  Acute and chronic respiratory failure with hypoxia [J96.21] Hypotension [I95.9] Acute on chronic respiratory failure with hypoxia and hypercapnia (HCC) [G03.78, J96.22] Patient Active Problem List   Diagnosis Date Noted   PAF (paroxysmal atrial fibrillation) (HCC) 03/13/2024   Elevated troponin 03/13/2024   Toxic metabolic encephalopathy 03/10/2024   Acute and chronic respiratory failure with hypoxia (HCC) 03/08/2024   Acute on chronic respiratory failure with hypoxia and hypercapnia (HCC) 03/08/2024   Acute exacerbation of CHF (congestive heart failure) (HCC) 03/07/2024   Chronic renal failure, stage 4 (severe) (HCC) 03/07/2024   Obesity, Class III, BMI 40-49.9 (morbid obesity) (HCC) 03/07/2024   Lobar pneumonia 03/07/2024   Essential hypertension 03/07/2024   Acute heart failure with preserved ejection fraction (HFpEF) (HCC) 02/01/2024   Acute hypoxemic respiratory failure (HCC) 02/01/2024  Macrocytic anemia 02/01/2024   Hypoglycemia 02/01/2024   Chronic heart failure with preserved ejection fraction (HCC) 03/28/2022   Elevated brain natriuretic peptide (BNP) level 02/23/2022   Leg edema 02/23/2022   AKI (acute kidney injury)    COVID-19    Acute renal failure (ARF) 05/01/2020   Pneumonia due to COVID-19 virus 05/01/2020   Sleep apnea    Stage 4 chronic kidney disease (HCC)    Hypertension    Degenerative joint disease    High cholesterol    Obesity    MGUS (monoclonal gammopathy of unknown significance)  12/21/2011   PCP:  Debrah Josette MOHR PA-C Pharmacy:   [facility] [address], [place] - [address] [place] [address] Phone: [phone] Fax: [phone]     Social Drivers of Health (SDOH) Social History: SDOH Screenings   Food Insecurity: No Food Insecurity (03/07/2024)  Housing: Low Risk  (03/07/2024)  Transportation Needs: No Transportation Needs (03/07/2024)  Utilities: Not At Risk (03/07/2024)  Social Connections: Unknown (03/07/2024)  Tobacco Use: Medium Risk (03/06/2024)   SDOH Interventions:     Readmission Risk Interventions     No data to display

## 2024-03-14 LAB — RENAL FUNCTION PANEL
Albumin: 3.1 g/dL — ABNORMAL LOW (ref 3.5–5.0)
Albumin: 2.6 g/dL — ABNORMAL LOW (ref 3.5–5.0)
Anion gap: 10 (ref 5–15)
Anion gap: 12 (ref 5–15)
BUN: 14 mg/dL (ref 8–23)
BUN: 13 mg/dL (ref 8–23)
CO2: 25 mmol/L (ref 22–32)
CO2: 24 mmol/L (ref 22–32)
Calcium: 8.7 mg/dL — ABNORMAL LOW (ref 8.9–10.3)
Calcium: 8.2 mg/dL — ABNORMAL LOW (ref 8.9–10.3)
Chloride: 101 mmol/L (ref 98–111)
Chloride: 95 mmol/L — ABNORMAL LOW (ref 98–111)
Creatinine, Ser: 1.32 mg/dL — ABNORMAL HIGH (ref 0.61–1.24)
Creatinine, Ser: 1.35 mg/dL — ABNORMAL HIGH (ref 0.61–1.24)
GFR, Estimated: 55 mL/min — ABNORMAL LOW (ref >60)
GFR, Estimated: 54 mL/min — ABNORMAL LOW (ref >60)
Glucose, Bld: 145 mg/dL — ABNORMAL HIGH (ref 70–99)
Glucose, Bld: 220 mg/dL — ABNORMAL HIGH (ref 70–99)
Phosphorus: 2.9 mg/dL (ref 2.5–4.6)
Phosphorus: 2.6 mg/dL (ref 2.5–4.6)
Potassium: 4.1 mmol/L (ref 3.5–5.1)
Potassium: 4.3 mmol/L (ref 3.5–5.1)
Sodium: 135 mmol/L (ref 135–145)
Sodium: 130 mmol/L — ABNORMAL LOW (ref 135–145)

## 2024-03-14 LAB — APTT: aPTT: 152 s — ABNORMAL HIGH (ref 24–36)

## 2024-03-14 LAB — GLUCOSE, CAPILLARY
Glucose-Capillary: 178 mg/dL — ABNORMAL HIGH (ref 70–99)
Glucose-Capillary: 130 mg/dL — ABNORMAL HIGH (ref 70–99)
Glucose-Capillary: 121 mg/dL — ABNORMAL HIGH (ref 70–99)
Glucose-Capillary: 130 mg/dL — ABNORMAL HIGH (ref 70–99)
Glucose-Capillary: 157 mg/dL — ABNORMAL HIGH (ref 70–99)
Glucose-Capillary: 139 mg/dL — ABNORMAL HIGH (ref 70–99)
Glucose-Capillary: 225 mg/dL — ABNORMAL HIGH (ref 70–99)
Glucose-Capillary: 117 mg/dL — ABNORMAL HIGH (ref 70–99)
Glucose-Capillary: 121 mg/dL — ABNORMAL HIGH (ref 70–99)

## 2024-03-14 LAB — HEPARIN LEVEL (UNFRACTIONATED)
Heparin Unfractionated: 0.64 [IU]/mL (ref 0.30–0.70)
Heparin Unfractionated: 0.34 [IU]/mL (ref 0.30–0.70)

## 2024-03-14 LAB — CBC
HCT: 34.9 % — ABNORMAL LOW (ref 39.0–52.0)
Hemoglobin: 11 g/dL — ABNORMAL LOW (ref 13.0–17.0)
MCH: 32.3 pg (ref 26.0–34.0)
MCHC: 31.5 g/dL (ref 30.0–36.0)
MCV: 102.3 fL — ABNORMAL HIGH (ref 80.0–100.0)
Platelets: 84 K/uL — ABNORMAL LOW (ref 150–400)
RBC: 3.41 MIL/uL — ABNORMAL LOW (ref 4.22–5.81)
RDW: 14.4 % (ref 11.5–15.5)
WBC: 11.1 K/uL — ABNORMAL HIGH (ref 4.0–10.5)
nRBC: 0 % (ref 0.0–0.2)

## 2024-03-14 LAB — MAGNESIUM: Magnesium: 2.3 mg/dL (ref 1.7–2.4)

## 2024-03-14 MED ORDER — VANCOMYCIN HCL 2000 MG/400ML IV SOLN
2000.0000 mg | Freq: Once | INTRAVENOUS | Status: AC
  Administered 2024-03-14: 2000 mg via INTRAVENOUS
  Filled 2024-03-14 (×2): qty 400

## 2024-03-14 MED ORDER — VANCOMYCIN HCL 1500 MG/300ML IV SOLN
1500.0000 mg | INTRAVENOUS | Status: DC
  Filled 2024-03-14: qty 300

## 2024-03-14 NOTE — Progress Notes (Addendum)
 [facility] [place], [place] 03/14/24  Subjective:   Hospital day # 99 78 year old male with hypertension, heart failure with preserved ejection fraction, hyperlipidemia, stage IV CKD, obstructive sleep apnea, MGUS, chronic respiratory failure on 2 L oxygen  presented from [facility] with shortness of breath.  Transferred to [facility] for need of continuous dialysis.  Patient critically ill Pressor support- norepinephrine.  Ventilator support FiO2 40%/PEEP 8.   Sedated with propofol. (Weaning) OG tube, ET tube, Foley in place. Remains on CRRT, UF -53ml/hr Tube feeds at 70 cc/h Urine output    Objective:  Vital signs in last 24 hours:  Temp:  [97.4 F (36.3 C)-98.7 F (37.1 C)] 98 F (36.7 C) (11/20 1000) Pulse Rate:  [54-101] 65 (11/20 0945) Resp:  [16-32] 24 (11/20 0945) SpO2:  [90 %-99 %] 94 % (11/20 0945) Arterial Line BP: (95-152)/(42-57) 114/49 (11/20 0945) FiO2 (%):  [40 %-70 %] 40 % (11/20 1106) Weight:  [135.1 kg-135.8 kg] 135.1 kg (11/20 0453)  Weight change:  Filed Weights   03/12/24 0530 03/13/24 1200 03/14/24 0453  Weight: (!) 136.1 kg 135.8 kg 135.1 kg    Intake/Output:    Intake/Output Summary (Last 24 hours) at 03/14/2024 1141 Last data filed at 03/14/2024 1100 Gross per 24 hour  Intake 3754.59 ml  Output 4916 ml  Net -1161.41 ml     Physical Exam: General: Critically ill-appearing  HEENT ET tube, OG tube in place  Pulm/lungs Ventilator assisted  CVS/Heart Tachycardic  Abdomen:  Nondistended  Extremities: Dependent edema is present  Neurologic:  sedated  Skin: Warm, dry  Access: Left femoral dialysis catheter       Basic Metabolic Panel:  Recent Labs  Lab 03/10/24 0441 03/10/24 1558 03/11/24 0429 03/11/24 1604 03/12/24 0412 03/12/24 1605 03/13/24 0346 03/13/24 1650 03/14/24 0324  NA 133*   < > 135   < > 133* 128* 135 125* 135  K 4.6   < > 3.9   < > 4.1 3.8 4.0 3.6 4.1  CL 101   < > 103   < > 101  97* 100 93* 101  CO2 23   < > 25   < > 25 24 26  21* 25  GLUCOSE 150*   < > 104*   < > 106* 170* 147* 429* 145*  BUN 33*   < > 18   < > 12 13 14 13 14   CREATININE 2.98*   < > 1.72*   < > 1.52* 1.17 1.27* 1.25* 1.32*  CALCIUM  8.2*   < > 8.5*   < > 8.4* 7.9* 8.8* 6.8* 8.7*  MG 2.3  --  2.4  --  2.3  --  2.3  --  2.3  PHOS 3.7   < > 2.3*   < > 2.5 2.3* 2.7 1.8* 2.9   < > = values in this interval not displayed.     CBC: Recent Labs  Lab 03/08/24 1932 03/09/24 0435 03/10/24 0441 03/11/24 0429 03/12/24 0412 03/13/24 0346 03/14/24 0324  WBC 17.1*   < > 9.7 9.9 14.7*  15.0* 9.8 11.1*  NEUTROABS 15.3*  --   --   --  13.2*  --   --   HGB 12.0*   < > 10.7* 10.7* 11.2*  11.2* 11.0* 11.0*  HCT 39.8   < > 33.0* 33.5* 35.3*  35.3* 34.9* 34.9*  MCV 107.6*   < > 100.0 99.7 102.0*  101.7* 102.3* 102.3*  PLT 179   < >  86* 79* 76*  75* 62* 84*   < > = values in this interval not displayed.     No results found for: HEPBSAG, HEPBSAB, HEPBIGM    Microbiology:  Recent Results (from the past 240 hours)  Blood culture (routine x 2)     Status: None   Collection Time: 03/06/24  9:29 PM   Specimen: BLOOD  Result Value Ref Range Status   Specimen Description BLOOD RIGHT ANTECUBITAL  Final   Special Requests   Final    BOTTLES DRAWN AEROBIC AND ANAEROBIC Blood Culture adequate volume   Culture   Final    NO GROWTH 5 DAYS Performed at [facility], [address], [place], [place] [address]    Report Status 03/11/2024 FINAL  Final  Resp panel by RT-PCR (RSV, Flu A&B, Covid) Anterior Nasal Swab     Status: None   Collection Time: 03/06/24  9:33 PM   Specimen: Anterior Nasal Swab  Result Value Ref Range Status   SARS Coronavirus 2 by RT PCR NEGATIVE NEGATIVE Final    Comment: (NOTE) SARS-CoV-2 target nucleic acids are NOT DETECTED.  The SARS-CoV-2 RNA is generally detectable in upper respiratory specimens during the acute phase of infection. The lowest concentration of  SARS-CoV-2 viral copies this assay can detect is 138 copies/mL. A negative result does not preclude SARS-Cov-2 infection and should not be used as the sole basis for treatment or other patient management decisions. A negative result may occur with  improper specimen collection/handling, submission of specimen other than nasopharyngeal swab, presence of viral mutation(s) within the areas targeted by this assay, and inadequate number of viral copies(<138 copies/mL). A negative result must be combined with clinical observations, patient history, and epidemiological information. The expected result is Negative.  Fact Sheet for Patients:  bloggercourse.com  Fact Sheet for Healthcare Providers:  seriousbroker.it  This test is no t yet approved or cleared by the [place]  FDA and  has been authorized for detection and/or diagnosis of SARS-CoV-2 by FDA under an Emergency Use Authorization (EUA). This EUA will remain  in effect (meaning this test can be used) for the duration of the COVID-19 declaration under Section 564(b)(1) of the Act, 21 U.S.C.section 360bbb-3(b)(1), unless the authorization is terminated  or revoked sooner.       Influenza A by PCR NEGATIVE NEGATIVE Final   Influenza B by PCR NEGATIVE NEGATIVE Final    Comment: (NOTE) The Xpert Xpress SARS-CoV-2/FLU/RSV plus assay is intended as an aid in the diagnosis of influenza from Nasopharyngeal swab specimens and should not be used as a sole basis for treatment. Nasal washings and aspirates are unacceptable for Xpert Xpress SARS-CoV-2/FLU/RSV testing.  Fact Sheet for Patients: bloggercourse.com  Fact Sheet for Healthcare Providers: seriousbroker.it  This test is not yet approved or cleared by the [place]  FDA and has been authorized for detection and/or diagnosis of SARS-CoV-2 by FDA under an Emergency Use  Authorization (EUA). This EUA will remain in effect (meaning this test can be used) for the duration of the COVID-19 declaration under Section 564(b)(1) of the Act, 21 U.S.C. section 360bbb-3(b)(1), unless the authorization is terminated or revoked.     Resp Syncytial Virus by PCR NEGATIVE NEGATIVE Final    Comment: (NOTE) Fact Sheet for Patients: bloggercourse.com  Fact Sheet for Healthcare Providers: seriousbroker.it  This test is not yet approved or cleared by the [place]  FDA and has been authorized for detection and/or diagnosis of SARS-CoV-2 by FDA under an Emergency Use  Authorization (EUA). This EUA will remain in effect (meaning this test can be used) for the duration of the COVID-19 declaration under Section 564(b)(1) of the Act, 21 U.S.C. section 360bbb-3(b)(1), unless the authorization is terminated or revoked.  Performed at [facility], [address] [place], [place] [address]   Blood culture (routine x 2)     Status: None   Collection Time: 03/06/24 10:55 PM   Specimen: BLOOD  Result Value Ref Range Status   Specimen Description BLOOD BLOOD RIGHT HAND  Final   Special Requests   Final    BOTTLES DRAWN AEROBIC AND ANAEROBIC Blood Culture adequate volume   Culture   Final    NO GROWTH 5 DAYS Performed at [facility], [address] [place], [place] [address]    Report Status 03/11/2024 FINAL  Final  MRSA Next Gen by PCR, Nasal     Status: None   Collection Time: 03/07/24  1:43 AM   Specimen: Nasal Mucosa; Nasal Swab  Result Value Ref Range Status   MRSA by PCR Next Gen NOT DETECTED NOT DETECTED Final    Comment: (NOTE) The GeneXpert MRSA Assay (FDA approved for NASAL specimens only), is one component of a comprehensive MRSA colonization surveillance program. It is not intended to diagnose MRSA infection nor to guide or monitor treatment for MRSA infections. Test performance is not FDA approved in  patients less than 21 years old. Performed at [facility], [address], [place], [place] [address]   Respiratory (~20 pathogens) panel by PCR     Status: None   Collection Time: 03/07/24 11:18 AM   Specimen: Nasopharyngeal Swab; Respiratory  Result Value Ref Range Status   Adenovirus NOT DETECTED NOT DETECTED Final   Coronavirus 229E NOT DETECTED NOT DETECTED Final    Comment: (NOTE) The Coronavirus on the Respiratory Panel, DOES NOT test for the novel  Coronavirus (2019 nCoV)    Coronavirus HKU1 NOT DETECTED NOT DETECTED Final   Coronavirus NL63 NOT DETECTED NOT DETECTED Final   Coronavirus OC43 NOT DETECTED NOT DETECTED Final   Metapneumovirus NOT DETECTED NOT DETECTED Final   Rhinovirus / Enterovirus NOT DETECTED NOT DETECTED Final   Influenza A NOT DETECTED NOT DETECTED Final   Influenza B NOT DETECTED NOT DETECTED Final   Parainfluenza Virus 1 NOT DETECTED NOT DETECTED Final   Parainfluenza Virus 2 NOT DETECTED NOT DETECTED Final   Parainfluenza Virus 3 NOT DETECTED NOT DETECTED Final   Parainfluenza Virus 4 NOT DETECTED NOT DETECTED Final   Respiratory Syncytial Virus NOT DETECTED NOT DETECTED Final   Bordetella pertussis NOT DETECTED NOT DETECTED Final   Bordetella Parapertussis NOT DETECTED NOT DETECTED Final   Chlamydophila pneumoniae NOT DETECTED NOT DETECTED Final   Mycoplasma pneumoniae NOT DETECTED NOT DETECTED Final    Comment: Performed at [facility], [address] [place], [place] [address]  MRSA Next Gen by PCR, Nasal     Status: None   Collection Time: 03/08/24  7:23 PM   Specimen: Nasal Mucosa; Nasal Swab  Result Value Ref Range Status   MRSA by PCR Next Gen NOT DETECTED NOT DETECTED Final    Comment: (NOTE) The GeneXpert MRSA Assay (FDA approved for NASAL specimens only), is one component of a comprehensive MRSA colonization surveillance program. It is not intended to diagnose MRSA infection nor to guide or monitor treatment for MRSA  infections. Test performance is not FDA approved in patients less than 18 years old. Performed at [facility],  [address] [place], [place] [address]   Culture, Respiratory w Gram Stain     Status: None (Preliminary result)   Collection Time: 03/12/24  8:21 AM   Specimen: Tracheal Aspirate; Respiratory  Result Value Ref Range Status   Specimen Description   Final    TRACHEAL ASPIRATE Performed at [facility], [address] [place], [place] [address]    Special Requests   Final    NONE Performed at [facility], [address] [place], [place] [address]    Gram Stain   Final    RARE WBC PRESENT,BOTH PMN AND MONONUCLEAR FEW GRAM POSITIVE RODS    Culture   Final    MODERATE STAPHYLOCOCCUS AUREUS SUSCEPTIBILITIES TO FOLLOW Performed at [facility], [address] [place], [place] [address]    Report Status PENDING  Incomplete  MRSA Next Gen by PCR, Nasal     Status: Abnormal   Collection Time: 03/13/24 11:57 AM   Specimen: Nasal Mucosa; Nasal Swab  Result Value Ref Range Status   MRSA by PCR Next Gen DETECTED (A) NOT DETECTED Final    Comment: RESULT CALLED TO, READ BACK BY AND VERIFIED WITH: JORGE MORALES AT 1326 ON 03/13/24 BY SS (NOTE) The GeneXpert MRSA Assay (FDA approved for NASAL specimens only), is one component of a comprehensive MRSA colonization surveillance program. It is not intended to diagnose MRSA infection nor to guide or monitor treatment for MRSA infections. Test performance is not FDA approved in patients less than 21 years old. Performed at [facility], [address] [place], [place] [address]     Coagulation Studies: No results for input(s): LABPROT, INR in the last 72 hours.   Urinalysis: No results for input(s): COLORURINE, LABSPEC, PHURINE, GLUCOSEU, HGBUR, BILIRUBINUR, KETONESUR, PROTEINUR, UROBILINOGEN, NITRITE, LEUKOCYTESUR in the last 72 hours.  Invalid  input(s): APPERANCEUR    Imaging: No results found.    Medications:    amiodarone 30 mg/hr (03/14/24 1100)   feeding supplement (VITAL HIGH PROTEIN) Stopped (03/14/24 0901)   heparin 1,650 Units/hr (03/14/24 1100)   norepinephrine (LEVOPHED) Adult infusion 8 mcg/min (03/14/24 1100)   piperacillin-tazobactam (ZOSYN)  IV 3.375 g (03/14/24 1108)   prismasol BGK 4/2.5 400 mL/hr at 03/13/24 1757   prismasol BGK 4/2.5 400 mL/hr at 03/13/24 1755   prismasol BGK 4/2.5 2,500 mL/hr at 03/14/24 1119   propofol (DIPRIVAN) infusion Stopped (03/14/24 0854)   [START ON 03/15/2024] vancomycin     vancomycin      Chlorhexidine Gluconate Cloth  6 each Topical Daily   free water  30 mL Per Tube Q4H   insulin  aspart  0-6 Units Subcutaneous Q4H   ipratropium-albuterol   3 mL Nebulization TID   multivitamin  1 tablet Per Tube QHS   mupirocin ointment  1 Application Nasal BID   nystatin  5 mL Oral QID   mouth rinse  15 mL Mouth Rinse Q2H   pantoprazole  (PROTONIX ) IV  40 mg Intravenous QHS   sertraline   25 mg Per Tube Daily   sodium chloride  flush  10-40 mL Intracatheter Q12H   thiamine  100 mg Per Tube Daily   docusate sodium, fentaNYL  (SUBLIMAZE ) injection, heparin, heparin, ipratropium-albuterol , mouth rinse, polyethylene glycol, sodium chloride  flush  Assessment/ Plan:  78 y.o. male with    Obesity, HFpEF and obstructive sleep apnea/COPD requiring 2L supplemental oxygen , hyperlipidemia, chronic kidney disease stage IV, MGUS, hypertension  admitted on 03/08/2024 for Acute and chronic respiratory failure with hypoxia [J96.21]  Hypotension [I95.9] Acute on chronic respiratory failure with hypoxia and hypercapnia (HCC) [J96.21, J96.22]  Acute kidney injury on chronic kidney disease stage IV.  Baseline creatinine of 2.5-2.8. Acute kidney injury is thought to be secondary to pneumonia and circulatory shock causing ATN.  Patient responded well to IV Lasix  yesterday.  However due to low CVP, further  doses were held. Ultrafiltration goal was reduced to -50 cc/h.  Plan -CRRT, net UF -50 - IV Furosemide  60mg  given yesterday - Pressor support per primary team.  Electrolytes and volume status are maintained.  Continue potassium bath of 4K.   Acute respiratory failure Currently requiring ventilator support.  FiO2 40%/PEEP 5 Respiratory pathogen's panel by PCR is negative.  Blood cultures from 03/06/2024 are negative.    LOS: 6 Faith Harris 11/20/202511:41 AM  [address] [place], [place] [phone]

## 2024-03-14 NOTE — Consult Note (Signed)
 PHARMACY - ANTICOAGULATION CONSULT NOTE  Pharmacy Consult for heparin  infusion Indication: atrial fibrillation  No Known Allergies  Patient Measurements: Height: 5' 9 (175.3 cm) Weight: 135.1 kg (297 lb 13.5 oz) IBW/kg (Calculated) : 70.7  Vital Signs: Temp: 97.8 F (36.6 C) (11/20 0400) Temp Source: Axillary (11/20 0400) Pulse Rate: 65 (11/20 0700)  Labs: Recent Labs    03/11/24 1343 03/11/24 1604 03/12/24 0412 03/12/24 1605 03/13/24 0346 03/13/24 1603 03/13/24 1650 03/14/24 0122 03/14/24 0324  HGB  --    < > 11.2*  11.2*  --  11.0*  --   --   --  11.0*  HCT  --   --  35.3*  35.3*  --  34.9*  --   --   --  34.9*  PLT  --   --  76*  75*  --  62*  --   --   --  84*  APTT  --   --  44*  --  52*  --   --   --  152*  HEPARINUNFRC 0.30  --   --   --   --  0.29*  --  0.64  --   CREATININE  --    < > 1.52*   < > 1.27*  --  1.25*  --  1.32*   < > = values in this interval not displayed.    Estimated Creatinine Clearance: 63 mL/min (A) (by C-G formula based on SCr of 1.32 mg/dL (H)).   Medical History: Past Medical History:  Diagnosis Date   CHF (congestive heart failure) (HCC)    Degenerative joint disease    High cholesterol    Hypertension    Kidney stones    Obesity    Respiratory failure with hypoxia (HCC)    Sleep apnea     Medications:  No anticoagulation PTA  Assessment: Patient is a 78 y/o male with a PMH significant for HFpEF, COPD, and acute on chronic hypoxic respiratory failure, currently with AKI requiring CRRT.  Pharmacy is consulted for initiation and management of a heparin  infusion ISO atrial fibrillation  Baseline labs WNL: Hbg 11.6, PLT 153, INR 1.0, aPTT 29  Goal of Therapy:  Heparin  level 0.3-0.7 units/ml Monitor platelets by anticoagulation protocol: Yes  Plan:  heparin  level therapeutic x 2 continue heparin  infusion at 1650 units/hr Next heparin  level check in am 11/21 Per MD: no heparin  bolus CBC daily while on  heparin   [name], PharmD Clinical Pharmacist 03/14/2024 7:15 AM

## 2024-03-14 NOTE — Progress Notes (Addendum)
 1640: Pharmacy has adjusted the heparin drip rate at this time.   1535PM: ICU provider and cardiology have been notified: FYI, RT changed the vent settings back to [facility] since the patient's breathing was sustaining in the 30's and he continued to be drowsy. his propofol was restarted at 25 mcg. He also had a coughing spell during the transition from pressure support to [facility] and started to cough up more pink tinged secretions with a few more small blood clots.    1215PM: ICU provider and Cardiologist have been notified that the patient continues to produce pink tinged sputum and the patient coughed a very small blood clot when trying to clear his secretions from his ET tube. It has been advised by cardiology to monitor the patient for now. The patient's is currently on pressure support. ICU provider has been notified the patient's secretions have slow down yet the patient is more drowsy then yesterday.    1000AM: ICU provider has been notified that the patient continues to produce pink tinged secretions at times. CRRT heparin syringe was discontinued last night due to pink tinged secretions.   0856 AM: Propofol has been turned off and wakeup assessment has been initiated.

## 2024-03-14 NOTE — Plan of Care (Signed)
  Problem: Coping: Goal: Level of anxiety will decrease Outcome: Progressing   Problem: Elimination: Goal: Will not experience complications related to bowel motility Outcome: Progressing   Problem: Pain Managment: Goal: General experience of comfort will improve and/or be controlled Outcome: Progressing   Problem: Metabolic: Goal: Ability to maintain appropriate glucose levels will improve Outcome: Progressing

## 2024-03-14 NOTE — Consult Note (Signed)
 PHARMACY CONSULT NOTE - ELECTROLYTES  Pharmacy Consult for Electrolyte Monitoring and Replacement   Recent Labs: Height: 5' 9 (175.3 cm) Weight: 135.1 kg (297 lb 13.5 oz) IBW/kg (Calculated) : 70.7 Estimated Creatinine Clearance: 63 mL/min (A) (by C-G formula based on SCr of 1.32 mg/dL (H)). Potassium  Date Value  03/14/2024 4.1 mmol/L  07/18/2013 3.4 mEq/L (L)   Magnesium (mg/dL)  Date Value  88/79/7974 2.3   Calcium  (mg/dL)  Date Value  88/79/7974 8.7 (L)  07/18/2013 10.1   Albumin (g/dL)  Date Value  88/79/7974 3.1 (L)  07/18/2013 3.8   Phosphorus (mg/dL)  Date Value  88/79/7974 2.9   Sodium  Date Value  03/14/2024 135 mmol/L  07/18/2013 138 mEq/L    Assessment  Dave Brown is a 78 y.o. male presenting with AKI requiring CRRT. PMH significant for HFpEF and COPD requiring 2L supplemental oxygen  . Pharmacy has been consulted to monitor and replace electrolytes.  Diet: VitalHP at 40 mL/hr + FWF at 30 mL every 4 hours MIVF: Primsasol CRRT fluid Pertinent medications: furosemide  60 mg IV x 1 11/19  Goal of Therapy: Electrolytes WNL  Plan:  No electrolyte replacement warranted for today Renal function panel ordered twice daily while on CRRT Check Mg, Phos with AM labs  Thank you for allowing pharmacy to be a part of this patient's care.  Adriana Bolster, PharmD, BCPS 03/14/2024 7:14 AM

## 2024-03-14 NOTE — Progress Notes (Signed)
 Rounding Note   Patient Name: Dave Brown Date of Encounter: 03/14/2024  [facility] Cardiologist: Newman JINNY Lawrence, MD   Subjective Remains intubated, sedated On CRRT, UF -100 mL/h yesterday down to -50 mL/h today On amiodarone infusion Telemetry reviewed, maintaining normal sinus rhythm no episodes of paroxysmal atrial fibrillation as seen on prior 24 hours Blood pressure low 91 up to 115 systolic this morning  Started on heparin infusion and amiodarone infusion yesterday for paroxysmal atrial fibrillation, no hematuria, no significant hemoptysis  Scheduled Meds:  Chlorhexidine Gluconate Cloth  6 each Topical Daily   free water  30 mL Per Tube Q4H   insulin  aspart  0-6 Units Subcutaneous Q4H   ipratropium-albuterol   3 mL Nebulization TID   multivitamin  1 tablet Per Tube QHS   mupirocin ointment  1 Application Nasal BID   nystatin  5 mL Oral QID   mouth rinse  15 mL Mouth Rinse Q2H   pantoprazole  (PROTONIX ) IV  40 mg Intravenous QHS   sertraline   25 mg Per Tube Daily   sodium chloride  flush  10-40 mL Intracatheter Q12H   thiamine  100 mg Per Tube Daily   Continuous Infusions:  amiodarone 30 mg/hr (03/14/24 1200)   feeding supplement (VITAL HIGH PROTEIN) Stopped (03/14/24 0901)   heparin 1,650 Units/hr (03/14/24 1200)   norepinephrine (LEVOPHED) Adult infusion 10 mcg/min (03/14/24 1200)   piperacillin-tazobactam (ZOSYN)  IV Stopped (03/14/24 1138)   prismasol BGK 4/2.5 400 mL/hr at 03/13/24 1757   prismasol BGK 4/2.5 400 mL/hr at 03/13/24 1755   prismasol BGK 4/2.5 2,500 mL/hr at 03/14/24 1119   propofol (DIPRIVAN) infusion Stopped (03/14/24 0854)   [START ON 03/15/2024] vancomycin     vancomycin 2,000 mg (03/14/24 1208)   PRN Meds: docusate sodium, fentaNYL  (SUBLIMAZE ) injection, heparin, heparin, ipratropium-albuterol , mouth rinse, polyethylene glycol, sodium chloride  flush   Vital Signs  Vitals:   03/14/24 0915 03/14/24 0930 03/14/24 0945 03/14/24  1000  BP:      Pulse: 62 67 65   Resp: (!) 26 (!) 24 (!) 24   Temp:    98 F (36.7 C)  TempSrc:    Axillary  SpO2: 95% 96% 94%   Weight:      Height:        Intake/Output Summary (Last 24 hours) at 03/14/2024 1208 Last data filed at 03/14/2024 1200 Gross per 24 hour  Intake 3844.18 ml  Output 5018 ml  Net -1173.82 ml      03/14/2024    4:53 AM 03/13/2024   12:00 PM 03/12/2024    5:30 AM  Last 3 Weights  Weight (lbs) 297 lb 13.5 oz 299 lb 6.2 oz 300 lb 0.7 oz  Weight (kg) 135.1 kg 135.8 kg 136.1 kg      Telemetry Normal sinus rhythm- Personally Reviewed  ECG   - Personally Reviewed  Physical Exam  GEN: Intubated, sedated Neck: Unable to estimate JVD Cardiac: RRR, no murmurs, rubs, or gallops.  Respiratory: Clear to auscultation bilaterally. GI: Soft,  non-distended  MS: No edema; No deformity. Neuro:  Nonfocal  Psych: Normal affect   Labs High Sensitivity Troponin:  No results for input(s): TROPONINIHS in the last 720 hours.   Chemistry Recent Labs  Lab 03/08/24 1932 03/09/24 0435 03/12/24 0412 03/12/24 1605 03/13/24 0346 03/13/24 1650 03/14/24 0324  NA 133*   < > 133*   < > 135 125* 135  K 5.4*   < > 4.1   < > 4.0 3.6 4.1  CL 94*   < > 101   < > 100 93* 101  CO2 27   < > 25   < > 26 21* 25  GLUCOSE 142*   < > 106*   < > 147* 429* 145*  BUN 74*   < > 12   < > 14 13 14   CREATININE 6.62*   < > 1.52*   < > 1.27* 1.25* 1.32*  CALCIUM  9.1   < > 8.4*   < > 8.8* 6.8* 8.7*  MG 2.7*   < > 2.3  --  2.3  --  2.3  PROT 7.0  --  5.8*  --   --   --   --   ALBUMIN 3.9   < > 3.1*  3.0*   < > 3.1* 2.7* 3.1*  AST 23  --  14*  --   --   --   --   ALT 20  --  12  --   --   --   --   ALKPHOS 90  --  80  --   --   --   --   BILITOT 0.4  --  0.5  --   --   --   --   GFRNONAA 8*   < > 47*   < > 58* 59* 55*  ANIONGAP 12   < > 7   < > 8 11 10    < > = values in this interval not displayed.    Lipids  Recent Labs  Lab 03/13/24 0346  TRIG 128     Hematology Recent Labs  Lab 03/12/24 0412 03/13/24 0346 03/14/24 0324  WBC 14.7*  15.0* 9.8 11.1*  RBC 3.46*  3.47* 3.41* 3.41*  HGB 11.2*  11.2* 11.0* 11.0*  HCT 35.3*  35.3* 34.9* 34.9*  MCV 102.0*  101.7* 102.3* 102.3*  MCH 32.4  32.3 32.3 32.3  MCHC 31.7  31.7 31.5 31.5  RDW 14.3  13.9 14.2 14.4  PLT 76*  75* 62* 84*   Thyroid  Recent Labs  Lab 03/08/24 0311  TSH 0.668  FREET4 0.71    BNPNo results for input(s): BNP, PROBNP in the last 168 hours.  DDimer No results for input(s): DDIMER in the last 168 hours.   Radiology  No results found.  Cardiac Studies   Patient Profile   78 y.o. male   Assessment & Plan  Acute on chronic respiratory failure/cardiogenic shock --hospital admission in October 2025 from [facility]-13th with similar symptoms, respiratory failure due to CHF, discharge weight 286 -Long history of smoking cigars -Weight trending up as outpatient 10 pounds Presenting March 06, 2024 with hypoxia, shortness of breath, hypotension requiring 15 L nonrebreather - Noncompliant with CPAP for 4 years -EF 60% on echo February 02, 2024 -BNP on presentation 16,139 - Started on empiric antibiotics, Lasix , unable to exclude pneumonia - Transitioned to CRRT for fluid removal   Chronic renal failure Unable to exclude component of cardiorenal syndrome creatinine greater than 2 on arrival Started on IV Lasix  on admission, transitioned to CRRT for fluid removal, rate decreased down to -50 from -100/h   Acute on chronic diastolic CHF Normal EF with severe LVH, restrictive physiology - Lasix  transition to CRRT - Will  require more aggressive diuretic protocol at discharge given to recent hospital admissions with similar CHF exacerbation   Elevated troponin Up to 200, likely supply/demand mismatch in the setting of respiratory failure - If meaningful recovery in stable renal  function, could consider right and left heart catheterization in the future  though may be limited secondary to renal dysfunction   Paroxysmal atrial fibrillation Started on heparin infusion on amiodarone infusion yesterday given frequent paroxysmal atrial fibrillation episodes -Over the past 24 hours atrial fibrillation has resolved -Would recommend we continue heparin and amiodarone given he is high risk of recurrent arrhythmia  watch closely for hematuria, hemoptysis   For questions or updates, please contact [facility] Please consult [email or web address] for contact info under     Signed, Kieren Adkison, MD  03/14/2024, 12:08 PM

## 2024-03-14 NOTE — Progress Notes (Signed)
 0000: Patient having bloody secretions, Loa Ruth, NP Notified. Verbal Order to stop Heparin  Syringe running through circuit on CRRT Machine. Will Cont. To monitor.   Osinachi Navarrette,RN

## 2024-03-14 NOTE — Plan of Care (Signed)
  Problem: Education: Goal: Knowledge of General Education information will improve Description: Including pain rating scale, medication(s)/side effects and non-pharmacologic comfort measures Outcome: Progressing   Problem: Health Behavior/Discharge Planning: Goal: Ability to manage health-related needs will improve Outcome: Progressing   Problem: Clinical Measurements: Goal: Ability to maintain clinical measurements within normal limits will improve Outcome: Progressing Goal: Will remain free from infection Outcome: Progressing Goal: Diagnostic test results will improve Outcome: Progressing Goal: Respiratory complications will improve Outcome: Progressing Goal: Cardiovascular complication will be avoided Outcome: Progressing   Problem: Activity: Goal: Risk for activity intolerance will decrease Outcome: Progressing   Problem: Nutrition: Goal: Adequate nutrition will be maintained Outcome: Progressing   Problem: Coping: Goal: Level of anxiety will decrease Outcome: Progressing   Problem: Elimination: Goal: Will not experience complications related to bowel motility Outcome: Progressing Goal: Will not experience complications related to urinary retention Outcome: Progressing   Problem: Pain Managment: Goal: General experience of comfort will improve and/or be controlled Outcome: Progressing   Problem: Pain Managment: Goal: General experience of comfort will improve and/or be controlled Outcome: Progressing   Problem: Safety: Goal: Ability to remain free from injury will improve Outcome: Progressing   Problem: Skin Integrity: Goal: Risk for impaired skin integrity will decrease Outcome: Progressing   Problem: Education: Goal: Ability to describe self-care measures that may prevent or decrease complications (Diabetes Survival Skills Education) will improve Outcome: Progressing Goal: Individualized Educational Video(s) Outcome: Progressing   Problem:  Coping: Goal: Ability to adjust to condition or change in health will improve Outcome: Progressing   Problem: Fluid Volume: Goal: Ability to maintain a balanced intake and output will improve Outcome: Progressing   Problem: Health Behavior/Discharge Planning: Goal: Ability to identify and utilize available resources and services will improve Outcome: Progressing Goal: Ability to manage health-related needs will improve Outcome: Progressing   Problem: Metabolic: Goal: Ability to maintain appropriate glucose levels will improve Outcome: Progressing   Problem: Nutritional: Goal: Maintenance of adequate nutrition will improve Outcome: Progressing Goal: Progress toward achieving an optimal weight will improve Outcome: Progressing   Problem: Skin Integrity: Goal: Risk for impaired skin integrity will decrease Outcome: Progressing   Problem: Tissue Perfusion: Goal: Adequacy of tissue perfusion will improve Outcome: Progressing

## 2024-03-14 NOTE — Progress Notes (Signed)
 Pharmacy Antibiotic Note  Dave Brown is a 78 y.o. male w/ PMH of HFpEF, HLD, COPD, depression admitted on 03/08/2024 with AKI and now pneumonia.  Pharmacy has been consulted for vancomycin  dosing. Currently on CRRT  Plan: start vancomycin  2000 mg IV x 1 then 1500 mg IV every 24 hours ---Peak 1hr after 2nd or 3rd dose; Trough 30 min  before 3rd or 4th dose, respectively ---continue to follow renal function / dialysis status  Height: 5' 9 (175.3 cm) Weight: 135.1 kg (297 lb 13.5 oz) IBW/kg (Calculated) : 70.7  Temp (24hrs), Avg:98.2 F (36.8 C), Min:97.4 F (36.3 C), Max:98.7 F (37.1 C)  Recent Labs  Lab 03/08/24 1932 03/09/24 0435 03/09/24 0840 03/09/24 1757 03/10/24 0441 03/10/24 1558 03/11/24 0429 03/11/24 1604 03/12/24 0412 03/12/24 1605 03/13/24 0346 03/13/24 1650 03/14/24 0324  WBC 17.1*   < >  --   --  9.7  --  9.9  --  14.7*  15.0*  --  9.8  --  11.1*  CREATININE 6.62*   < >  --    < > 2.98*   < > 1.72*   < > 1.52* 1.17 1.27* 1.25* 1.32*  LATICACIDVEN 0.8  --  1.2  --   --   --   --   --   --   --   --   --   --    < > = values in this interval not displayed.    Estimated Creatinine Clearance: 63 mL/min (A) (by C-G formula based on SCr of 1.32 mg/dL (H)).    No Known Allergies  Antimicrobials this admission: 11/18 Zosyn  >>  11/20 vancomycin  >>   Microbiology results: 11/12 BCx: NG final 11/18 Sputum: mS aureus (susceptibility pending)  11/19 MRSA PCR: positive  Thank you for allowing pharmacy to be a part of this patient's care.  Dave Brown 03/14/2024 10:13 AM

## 2024-03-14 NOTE — Progress Notes (Signed)
 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 He denied any fevers, chills, chest pain.     Of note, the patient was recently admitted to the hospital from 02/01/2024 to 02/05/2024 for respiratory failure due to CHF.  The patient did not require oxygen  at the time of his discharge.  His discharge weight was 286.  At his last office visit with his primary provider his weight was 291 pounds on 02/22/24.  Follow up with pulmonary on 03/04/24 office weight 29.   He was discharged home with furosemide  40 mg daily during last hospitalization   ED Course: Initial Vital Signs: afebrile and hemodynamically stable. Oxygen  saturation was 93-97% on 4 L  Significant Labs: WBC 11.0, hemoglobin 12.3, platelets 138. Sodium 141, potassium 4.1, bicarbonate 31, serum creatinine 4.15. proBNP 16,139. PCT 0.13. Lactic acid 1.9 >> 0.8. COVID-negative  Imaging Chest X-ray>>patchy opacities right lower lobe and left lower lobe  Medications Administered: furosemide  40 mg IV, Solu-Medrol  125 mg, ceftriaxone , and azithromycin . He was given albuterol  and Atrovent .    [facility] asked to admit for further workup and treatment.  He gradually became hypotensive requiring initiation of low dose Levophed  (3 to 5 mcg), PCCM was consulted.  Nephrology was consulted for oliguria. Case was discussed with nephrology and it was felt the patient need to be initiated on CRRT. Subsequently, initial request was made to transfer the patient to [facility]. In order to expedite care, he is being transferred to [facility] regional ICU to critical care service where  nephrology will be consulted for continued management of his renal failure and fluid overload.   Pertinent  Medical History  As above.   Significant Hospital Events: Including procedures, antibiotic start and stop dates in addition to other pertinent events   11/12: Admitted to [facility] to  for treatment of Acute Decompensated HFpEF and AKI. 11/13: BP decreased, started on Levophed .  PCCM consulted.  ABX discontinued. 11/14: Transfer to [facility] for initiation of CRRT.  11/15: seizure like episode, intubated 11/16: Unable to wean; PICC line placed  Interim History / Subjective:  -- No major events yesterday.  -- CRRT down to -50cc/hr. CVP 14 -- MRSA +, Trach Asp Staph aureus    Objective    Blood pressure 119/61, pulse 65, temperature 97.9 F (36.6 C), temperature source Axillary, resp. rate (!) 24, height 5' 9 (1.753 m), weight 135.1 kg, SpO2 94%. CVP:  [11 mmHg-23 mmHg] 13 mmHg  Vent Mode: PRVC FiO2 (%):  [40 %-70 %] 40 % Set Rate:  [22 bmp] 22 bmp Vt Set:  [480 mL] 480 mL PEEP:  [5 cmH20] 5 cmH20 Pressure Support:  [10 cmH20] 10 cmH20 Plateau Pressure:  [20 cmH20] 20 cmH20   Intake/Output Summary (Last 24 hours) at 03/14/2024 1011 Last data filed at 03/14/2024 1000 Gross per 24 hour  Intake 3769.95 ml  Output 4885 ml  Net -1115.05 ml   Filed Weights   03/12/24 0530 03/13/24 1200 03/14/24 0453  Weight: (!) 136.1 kg 135.8 kg 135.1 kg    Examination: General: Intubated and sedated HENT: Supple neck, reactive pupils, EOMI  Lungs: Coarse breath sounds bilaterally  Cardiovascular: Normal S1, Normal S2, RRR Abdomen: Soft, non tender, non distended, +BS  Extremities: Warm, Trace lower ext edema.   Labs and imaging were reviewed.   Assessment and Plan  #Acute Hypoxic Respiratory Failure requiring intubation and MV 11/15 #Acute on Chronic HFpEF #Acute Renal Failure on CKD #Cardiogenic Shock #T2DM #Toxic Metabolic Encephalopathy #Paroxysmal A.fib    Neuro -  Encephalopathic secondary to hypercarbia, without improvement following BiPAP. Required dexmedetomidine  for sedation to tolerate care (CRRT, BIPAP, etc...). Patient developed as seizure like episode with further encephalopathy and respiratory distress. EEG Normal. CT head unremarkable. Possibole due  to hypercapnic resp failure. SAT this am.  CV - Decompensated HFpEF with anasarca and concomitant renal failure. TTE with severe LVH and diastolic dysfunction, suspect also component of pulmonary hypertension from untreated OSA/OHS. Tolerating CRRT UF -50cc/hr well.  Appreciate input from cardiology. Continue to titrate vasopressors for goal MAP > 65 mmHg. Pulm - Intubated due to worsening encephalopathy as well as worsening respiratory failure with hypoxia and hypercapnia. CRRT for volume mobilization, and now vented, trending blood gas to ensure ventilation and oxygenation. SBT this AM.  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 Started on CRRT, tolerated well during the day, now running -100 ml/hour.  Endo - ICU glycemic protocol. Hem/Onc - Restart heparin  drip for A.fib. H&H stable being mindful platelets are 62.  ID -  WBC uptrending and SvO2 82%. Trach aspirate with staph aureus. MRSA Swab +.  Blood cultures pending negative so far.  Started on Zosyn  11/18 will add vancomycin  given MRSA PCR +.    Critical care time: 39 minutes     Darrin Barn, MD [facility] 03/14/2024 10:11 AM

## 2024-03-14 NOTE — Consult Note (Signed)
 PHARMACY - ANTICOAGULATION CONSULT NOTE  Pharmacy Consult for heparin infusion Indication: atrial fibrillation  No Known Allergies  Patient Measurements: Height: 5' 9 (175.3 cm) Weight: 135.8 kg (299 lb 6.2 oz) IBW/kg (Calculated) : 70.7  Vital Signs: Temp: 98.2 F (36.8 C) (11/19 2000) Temp Source: Axillary (11/19 2000) Pulse Rate: 66 (11/20 0000)  Labs: Recent Labs    03/11/24 0429 03/11/24 1343 03/11/24 1604 03/12/24 0412 03/12/24 1605 03/13/24 0346 03/13/24 1603 03/13/24 1650 03/14/24 0122  HGB 10.7*  --   --  11.2*  11.2*  --  11.0*  --   --   --   HCT 33.5*  --   --  35.3*  35.3*  --  34.9*  --   --   --   PLT 79*  --   --  76*  75*  --  62*  --   --   --   APTT  --   --   --  44*  --  52*  --   --   --   HEPARINUNFRC 0.25* 0.30  --   --   --   --  0.29*  --  0.64  CREATININE 1.72*  --    < > 1.52* 1.17 1.27*  --  1.25*  --    < > = values in this interval not displayed.    Estimated Creatinine Clearance: 66.6 mL/min (A) (by C-G formula based on SCr of 1.25 mg/dL (H)).   Medical History: Past Medical History:  Diagnosis Date   CHF (congestive heart failure) (HCC)    Degenerative joint disease    High cholesterol    Hypertension    Kidney stones    Obesity    Respiratory failure with hypoxia (HCC)    Sleep apnea     Medications:  No anticoagulation PTA  Assessment: Patient is a 78 y/o male with a PMH significant for HFpEF, COPD, and acute on chronic hypoxic respiratory failure, currently with AKI requiring CRRT.  Pharmacy is consulted for initiation and management of a heparin infusion ISO atrial fibrillation  Baseline labs WNL: Hbg 11.6, PLT 153, INR 1.0, aPTT 29  Goal of Therapy:  Heparin level 0.3-0.7 units/ml Monitor platelets by anticoagulation protocol: Yes  Plan:  11/20:  HL @ 0122 = 0.64, therapeutic X 1 - continue pt on current rate and recheck HL in 8 hrs  Per MD: no heparin bolus CBC daily while on heparin  Narely Nobles D,  PharmD Clinical Pharmacist 03/14/2024 1:55 AM

## 2024-03-15 DIAGNOSIS — I2510 Atherosclerotic heart disease of native coronary artery without angina pectoris: Secondary | ICD-10-CM

## 2024-03-15 DIAGNOSIS — I4581 Long QT syndrome: Secondary | ICD-10-CM

## 2024-03-15 DIAGNOSIS — I498 Other specified cardiac arrhythmias: Secondary | ICD-10-CM

## 2024-03-15 DIAGNOSIS — I440 Atrioventricular block, first degree: Secondary | ICD-10-CM

## 2024-03-15 DIAGNOSIS — R579 Shock, unspecified: Secondary | ICD-10-CM

## 2024-03-15 LAB — RENAL FUNCTION PANEL
Albumin: 3.1 g/dL — ABNORMAL LOW (ref 3.5–5.0)
Albumin: 3 g/dL — ABNORMAL LOW (ref 3.5–5.0)
Albumin: 2.9 g/dL — ABNORMAL LOW (ref 3.5–5.0)
Anion gap: 8 (ref 5–15)
Anion gap: 10 (ref 5–15)
Anion gap: 8 (ref 5–15)
BUN: 14 mg/dL (ref 8–23)
BUN: 13 mg/dL (ref 8–23)
BUN: 16 mg/dL (ref 8–23)
CO2: 26 mmol/L (ref 22–32)
CO2: 25 mmol/L (ref 22–32)
CO2: 25 mmol/L (ref 22–32)
Calcium: 8.8 mg/dL — ABNORMAL LOW (ref 8.9–10.3)
Calcium: 8.9 mg/dL (ref 8.9–10.3)
Calcium: 9 mg/dL (ref 8.9–10.3)
Chloride: 100 mmol/L (ref 98–111)
Chloride: 100 mmol/L (ref 98–111)
Chloride: 98 mmol/L (ref 98–111)
Creatinine, Ser: 1.27 mg/dL — ABNORMAL HIGH (ref 0.61–1.24)
Creatinine, Ser: 1.27 mg/dL — ABNORMAL HIGH (ref 0.61–1.24)
Creatinine, Ser: 1.53 mg/dL — ABNORMAL HIGH (ref 0.61–1.24)
GFR, Estimated: 58 mL/min — ABNORMAL LOW (ref >60)
GFR, Estimated: 58 mL/min — ABNORMAL LOW (ref >60)
GFR, Estimated: 46 mL/min — ABNORMAL LOW (ref >60)
Glucose, Bld: 147 mg/dL — ABNORMAL HIGH (ref 70–99)
Glucose, Bld: 157 mg/dL — ABNORMAL HIGH (ref 70–99)
Glucose, Bld: 255 mg/dL — ABNORMAL HIGH (ref 70–99)
Phosphorus: 2.7 mg/dL (ref 2.5–4.6)
Phosphorus: 2.3 mg/dL — ABNORMAL LOW (ref 2.5–4.6)
Phosphorus: 2.3 mg/dL — ABNORMAL LOW (ref 2.5–4.6)
Potassium: 4 mmol/L (ref 3.5–5.1)
Potassium: 4.1 mmol/L (ref 3.5–5.1)
Potassium: 4.3 mmol/L (ref 3.5–5.1)
Sodium: 135 mmol/L (ref 135–145)
Sodium: 134 mmol/L — ABNORMAL LOW (ref 135–145)
Sodium: 130 mmol/L — ABNORMAL LOW (ref 135–145)

## 2024-03-15 LAB — BLOOD GAS, ARTERIAL
Acid-Base Excess: 1.2 mmol/L (ref 0.0–2.0)
Acid-base deficit: 0.8 mmol/L (ref 0.0–2.0)
Acid-base deficit: 3 mmol/L — ABNORMAL HIGH (ref 0.0–2.0)
Bicarbonate: 26.2 mmol/L (ref 20.0–28.0)
Bicarbonate: 24.4 mmol/L (ref 20.0–28.0)
Bicarbonate: 26 mmol/L (ref 20.0–28.0)
Delivery systems: POSITIVE
Delivery systems: POSITIVE
Expiratory PAP: 8 cmH2O
Expiratory PAP: 8 cmH2O
FIO2: 40 %
FIO2: 28 %
Inspiratory PAP: 12 cmH2O
Inspiratory PAP: 14 cmH2O
O2 Saturation: 99.4 %
O2 Saturation: 99.7 %
O2 Saturation: 94.9 %
Patient temperature: 37
Patient temperature: 37
Patient temperature: 37
RATE: 16 {breaths}/min
pCO2 arterial: 52 mmHg — ABNORMAL HIGH (ref 32–48)
pCO2 arterial: 52 mmHg — ABNORMAL HIGH (ref 32–48)
pCO2 arterial: 41 mmHg (ref 32–48)
pH, Arterial: 7.31 — ABNORMAL LOW (ref 7.35–7.45)
pH, Arterial: 7.28 — ABNORMAL LOW (ref 7.35–7.45)
pH, Arterial: 7.41 (ref 7.35–7.45)
pO2, Arterial: 133 mmHg — ABNORMAL HIGH (ref 83–108)
pO2, Arterial: 123 mmHg — ABNORMAL HIGH (ref 83–108)
pO2, Arterial: 73 mmHg — ABNORMAL LOW (ref 83–108)

## 2024-03-15 LAB — CBC
HCT: 35.7 % — ABNORMAL LOW (ref 39.0–52.0)
Hemoglobin: 11.2 g/dL — ABNORMAL LOW (ref 13.0–17.0)
MCH: 32 pg (ref 26.0–34.0)
MCHC: 31.4 g/dL (ref 30.0–36.0)
MCV: 102 fL — ABNORMAL HIGH (ref 80.0–100.0)
Platelets: 116 K/uL — ABNORMAL LOW (ref 150–400)
RBC: 3.5 MIL/uL — ABNORMAL LOW (ref 4.22–5.81)
RDW: 14.6 % (ref 11.5–15.5)
WBC: 12.2 K/uL — ABNORMAL HIGH (ref 4.0–10.5)
nRBC: 0 % (ref 0.0–0.2)

## 2024-03-15 LAB — COOXEMETRY PANEL
Carboxyhemoglobin: 1.6 % — ABNORMAL HIGH (ref 0.5–1.5)
Carboxyhemoglobin: 1.9 % — ABNORMAL HIGH (ref 0.5–1.5)
Carboxyhemoglobin: 2.1 % — ABNORMAL HIGH (ref 0.5–1.5)
Methemoglobin: 1.2 % (ref 0.0–1.5)
Methemoglobin: 1.3 % (ref 0.0–1.5)
Methemoglobin: 0.9 % (ref 0.0–1.5)
O2 Saturation: 93.8 %
O2 Saturation: 99.4 %
O2 Saturation: 69.6 %
Total hemoglobin: 10.7 g/dL — ABNORMAL LOW (ref 12.0–16.0)
Total hemoglobin: 10.8 g/dL — ABNORMAL LOW (ref 12.0–16.0)
Total hemoglobin: 18.9 g/dL — ABNORMAL HIGH (ref 12.0–16.0)
Total oxygen content: 91.1 %
Total oxygen content: 96.3 %
Total oxygen content: 67.5 %

## 2024-03-15 LAB — BASIC METABOLIC PANEL WITH GFR
Anion gap: 10 (ref 5–15)
BUN: 13 mg/dL (ref 8–23)
CO2: 24 mmol/L (ref 22–32)
Calcium: 8.9 mg/dL (ref 8.9–10.3)
Chloride: 101 mmol/L (ref 98–111)
Creatinine, Ser: 1.29 mg/dL — ABNORMAL HIGH (ref 0.61–1.24)
GFR, Estimated: 57 mL/min — ABNORMAL LOW (ref >60)
Glucose, Bld: 145 mg/dL — ABNORMAL HIGH (ref 70–99)
Potassium: 4.3 mmol/L (ref 3.5–5.1)
Sodium: 134 mmol/L — ABNORMAL LOW (ref 135–145)

## 2024-03-15 LAB — CULTURE, RESPIRATORY W GRAM STAIN

## 2024-03-15 LAB — GLUCOSE, CAPILLARY
Glucose-Capillary: 146 mg/dL — ABNORMAL HIGH (ref 70–99)
Glucose-Capillary: 187 mg/dL — ABNORMAL HIGH (ref 70–99)
Glucose-Capillary: 104 mg/dL — ABNORMAL HIGH (ref 70–99)
Glucose-Capillary: 245 mg/dL — ABNORMAL HIGH (ref 70–99)
Glucose-Capillary: 110 mg/dL — ABNORMAL HIGH (ref 70–99)

## 2024-03-15 LAB — HEPARIN LEVEL (UNFRACTIONATED): Heparin Unfractionated: 0.34 [IU]/mL (ref 0.30–0.70)

## 2024-03-15 LAB — APTT: aPTT: 91 s — ABNORMAL HIGH (ref 24–36)

## 2024-03-15 LAB — MAGNESIUM: Magnesium: 2.4 mg/dL (ref 1.7–2.4)

## 2024-03-15 LAB — LACTIC ACID, PLASMA: Lactic Acid, Venous: 0.7 mmol/L (ref 0.5–1.9)

## 2024-03-15 MED ORDER — ORAL CARE MOUTH RINSE: 15.0000 mL | OROMUCOSAL | Status: DC | PRN

## 2024-03-15 MED ORDER — LACTATED RINGERS IV BOLUS
500.0000 mL | Freq: Once | INTRAVENOUS | Status: AC
  Administered 2024-03-15: 500 mL via INTRAVENOUS

## 2024-03-15 MED ORDER — VANCOMYCIN HCL 1500 MG/300ML IV SOLN
1500.0000 mg | Freq: Once | INTRAVENOUS | Status: AC
  Administered 2024-03-15: 1500 mg via INTRAVENOUS
  Filled 2024-03-15: qty 300

## 2024-03-15 MED ORDER — PIPERACILLIN-TAZOBACTAM 3.375 G IVPB
3.3750 g | Freq: Three times a day (TID) | INTRAVENOUS | Status: AC
  Administered 2024-03-15 – 2024-03-16 (×4): 3.375 g via INTRAVENOUS
  Filled 2024-03-15 (×4): qty 50

## 2024-03-15 MED ORDER — DEXMEDETOMIDINE HCL IN NACL 400 MCG/100ML IV SOLN
0.0000 ug/kg/h | INTRAVENOUS | Status: DC
  Administered 2024-03-15: 1.1 ug/kg/h via INTRAVENOUS
  Administered 2024-03-15: 0.4 ug/kg/h via INTRAVENOUS
  Administered 2024-03-15: 0.7 ug/kg/h via INTRAVENOUS
  Administered 2024-03-15: 0.4 ug/kg/h via INTRAVENOUS
  Administered 2024-03-16: 1 ug/kg/h via INTRAVENOUS
  Administered 2024-03-16 (×3): 1.2 ug/kg/h via INTRAVENOUS
  Administered 2024-03-16: 0.6 ug/kg/h via INTRAVENOUS
  Administered 2024-03-17: 1.2 ug/kg/h via INTRAVENOUS
  Administered 2024-03-17: 0.1 ug/kg/h via INTRAVENOUS
  Administered 2024-03-17 (×2): 1.2 ug/kg/h via INTRAVENOUS
  Administered 2024-03-18: 0.5 ug/kg/h via INTRAVENOUS
  Filled 2024-03-15 (×14): qty 100

## 2024-03-15 MED ORDER — ORAL CARE MOUTH RINSE
15.0000 mL | OROMUCOSAL | Status: DC
  Administered 2024-03-15 – 2024-04-05 (×69): 15 mL via OROMUCOSAL

## 2024-03-15 MED ORDER — VASOPRESSIN 20 UNITS/100 ML INFUSION FOR SHOCK
0.0000 [IU]/min | INTRAVENOUS | Status: DC
  Filled 2024-03-15: qty 100

## 2024-03-15 MED ORDER — DOBUTAMINE-DEXTROSE 4-5 MG/ML-% IV SOLN
2.5000 ug/kg/min | INTRAVENOUS | Status: DC
  Administered 2024-03-15: 2.5 ug/kg/min via INTRAVENOUS
  Administered 2024-03-16: 5 ug/kg/min via INTRAVENOUS
  Filled 2024-03-15 (×2): qty 250

## 2024-03-15 MED ORDER — VANCOMYCIN VARIABLE DOSE PER UNSTABLE RENAL FUNCTION (PHARMACIST DOSING): Status: DC

## 2024-03-15 NOTE — Progress Notes (Signed)
 Patient extubated to BiPAP per MD order with no complications.  O2 saturation and vital signs remained stable throughout.

## 2024-03-15 NOTE — Progress Notes (Signed)
   03/15/24 1500  Spiritual Encounters  Type of Visit Follow up  Care provided to: Pt not available (No family in the room at this time; Pt is resting)  Conversation partners present during Programmer, Systems  Referral source Chaplain assessment  Reason for visit Routine spiritual support  OnCall Visit No  Interventions  Spiritual Care Interventions Made Compassionate presence

## 2024-03-15 NOTE — Progress Notes (Signed)
 Nutrition Follow-up  DOCUMENTATION CODES:   Obesity unspecified  INTERVENTION:   RD will add supplements with diet advancement   Rena-vit po daily with diet advancement   Thiamine  100mg  po daily with diet advancement x 7 days   Pt remains at high refeed risk; recommend monitor potassium, magnesium and phosphorus labs daily until stable  Daily weights   Check copper , zinc , B6, B12, B1, folate and vitamin C levels  NUTRITION DIAGNOSIS:   Increased nutrient needs related to acute illness (CRRT) as evidenced by estimated needs. -ongoing   GOAL:   Patient will meet greater than or equal to 90% of their needs -met   MONITOR:   Diet advancement, Labs, Weight trends, I & O's, Skin  ASSESSMENT:   78 y/o male with h/o MGUS, OSA, CKD IV, DJD, HTN, CHF, kidney stones and HLD who is admitted with CHF, volume overload, cardiogenic shock and AKI requiring CRRT initiation 11/14.  Pt extubated to bipap today. RD will add supplements and vitamins with diet advancement. Pt is refeeding; electrolytes are being monitored and supplemented as needed. Plan is to stop CRRT today and provide HD as needed. RD will check vitamin labs once CRRT stopped. Pt is having bowel function. Per chart, pt is at his UBW currently. Pt -9.0L on his I & Os.   Medications reviewed and include: insulin , rena-vit, protonix , thiamine , levophed , zosyn , vancomycin    Labs reviewed: Na 134(L), K 4.1 wnl, creat 1.27(H), P 2.3(L), Mg 2.4 wnl Wbc- 12.2(H) Cbgs- 104, 187, 146 x 24 hrs   UOP-   Diet Order:   Diet Order             Diet NPO time specified  Diet effective now                  EDUCATION NEEDS:   Not appropriate for education at this time  Skin:  Skin Assessment: Reviewed RN Assessment (blister L buttocks)  Last BM:  11/21- type 6  Height:   Ht Readings from Last 1 Encounters:  03/09/24 5' 9 (1.753 m)    Weight:   Wt Readings from Last 1 Encounters:  03/15/24 130.1 kg     Ideal Body Weight:  72.7 kg  BMI:  Body mass index is 42.36 kg/m.  Estimated Nutritional Needs:   Kcal:  2700-3000kcal/day  Protein:  >135g/day  Fluid:  1.8-2.1L/day  Augustin Shams MS, RD, LDN If unable to be reached, please send secure chat to RD inpatient available from 8:00a-4:00p daily

## 2024-03-15 NOTE — Progress Notes (Addendum)
 1403 PM: CRRT has been stopped. Blood has been returned to the patient. HD cath blue and red line have filled with 1.8 mL of Heparin .  1356 PM: ABG and Co-ox have been obtained.  1123 AM: The patient has been extubated by RT  0900 AM : CVP is 15  0825 AM: 500 mL LR bolus has been initiated.    0810 AM: ICU provider has net net hourly output goal to be net 0 at this time. 500 mL bolus of LR has been ordered.   9240 AM: ICU provider has been notified: FYI, night shift RN reports she stopped pulling fluid off the patient since around 2 AM since his blood pressure was not tolerating it as his blood pressure would drop and he had met his goal for yesterday. His urine output started to pick up. CVP is currently 6/7.    0750 AM: Heparin  drip has been stopped  0743 AM: ICU provider and cardiologist have been notified: The patient is having bloody secretions compared to yesterday where it was pink tinged. heparin  is still running at 15 ml/hr. I had to get RT to adjust ET tube since his ET had retracted back some as well. Amiodarone  drip was stopped overnight as the patient had a 12 LED EKG done for Accelerated ventricular rhythm.

## 2024-03-15 NOTE — Progress Notes (Signed)
 Pharmacy Antibiotic Note  Dave Brown is a 78 y.o. male w/ PMH of HFpEF, HLD, COPD, depression admitted on 03/08/2024 with AKI and now pneumonia.  Pharmacy has been consulted for vancomycin  dosing. Previously on CRRT which was stopped today  Plan:   1) start vancomycin  1500 mg IV x 1 ---vancomycin  variable dose marker placed ISO unclear renal function ---will draw a random vancomycin  level in am to assess clearance ---continue to follow renal function / dialysis status  2) adjust Zosyn  to 3.375 grams IV (Extended Infusion) every 8 hours  Height: 5' 9 (175.3 cm) Weight: 130.1 kg (286 lb 13.1 oz) IBW/kg (Calculated) : 70.7  Temp (24hrs), Avg:97.5 F (36.4 C), Min:96.3 F (35.7 C), Max:98.9 F (37.2 C)  Recent Labs  Lab 03/08/24 1932 03/09/24 0435 03/09/24 0840 03/09/24 1757 03/11/24 0429 03/11/24 1604 03/12/24 0412 03/12/24 1605 03/13/24 0346 03/13/24 1650 03/14/24 0324 03/14/24 1611 03/15/24 0135 03/15/24 0509  WBC 17.1*   < >  --    < > 9.9  --  14.7*  15.0*  --  9.8  --  11.1*  --  12.2*  --   CREATININE 6.62*   < >  --    < > 1.72*   < > 1.52*   < > 1.27* 1.25* 1.32* 1.35* 1.29* 1.27*  LATICACIDVEN 0.8  --  1.2  --   --   --   --   --   --   --   --   --  0.7  --    < > = values in this interval not displayed.    Estimated Creatinine Clearance: 64.1 mL/min (A) (by C-G formula based on SCr of 1.27 mg/dL (H)).    No Known Allergies  Antimicrobials this admission: 11/18 Zosyn  >>  11/20 vancomycin  >>   Microbiology results: 11/12 BCx: NG final 11/18 Sputum: MRSA 11/19 MRSA PCR: positive  Thank you for allowing pharmacy to be a part of this patient's care.  Dave Brown 03/15/2024 8:05 AM

## 2024-03-15 NOTE — Consult Note (Signed)
 PHARMACY CONSULT NOTE - ELECTROLYTES  Pharmacy Consult for Electrolyte Monitoring and Replacement   Recent Labs: Height: 5' 9 (175.3 cm) Weight: 130.1 kg (286 lb 13.1 oz) IBW/kg (Calculated) : 70.7 Estimated Creatinine Clearance: 64.1 mL/min (A) (by C-G formula based on SCr of 1.27 mg/dL (H)). Potassium  Date Value  03/15/2024 4.1 mmol/L  07/18/2013 3.4 mEq/L (L)   Magnesium (mg/dL)  Date Value  88/78/7974 2.4   Calcium  (mg/dL)  Date Value  88/78/7974 8.9  07/18/2013 10.1   Albumin (g/dL)  Date Value  88/78/7974 3.0 (L)  07/18/2013 3.8   Phosphorus (mg/dL)  Date Value  88/78/7974 2.3 (L)   Sodium  Date Value  03/15/2024 134 mmol/L (L)  07/18/2013 138 mEq/L    Assessment  Dave Brown is a 78 y.o. male presenting with AKI requiring CRRT. PMH significant for HFpEF and COPD requiring 2L supplemental oxygen  . Pharmacy has been consulted to monitor and replace electrolytes.  Goal of Therapy: Electrolytes WNL  Plan:  No electrolyte replacement warranted for today CRRT stopped today: nephrology assessing daily for need for  Check Mg, Phos with AM labs  Thank you for allowing pharmacy to be a part of this patient's care.  Adriana Bolster, PharmD, BCPS 03/15/2024 7:34 AM

## 2024-03-15 NOTE — Progress Notes (Signed)
 0122 - Pt's BP noted to be increasingly more labile, despite increase in Levophed , decreasing sedation and ensuring proper position of a-line. Pt's MAP noted to be fluctuating between 57-70. Discussed w/ provider. Order received for lactic, HH and Vasopressin . Checked cuff pressure, which correlated. Discussed pt's net I&O since admission. Plan to obtain labs, start Vasopressin , and run CRRT at net even for now. Will readdress after labs have resulted.    0150 - Pt noted to have change in cardiac rhythm with drop in BP for approximately 30 seconds. Appeared to be accelerated idioventricular rhythm. EKG obtained, however, unable to capture rhythm. Pt noted to be in normal sinus rhythm w/ prolonged QTC on EKG. Tele strip saved/printed and showed to provider. Add-on order obtained for BMP from labs sent down at approximately 0130. Provider at bedside to evaluated. Order to D/C Amiodarone  received. Also received verbal order to maintain SBP > 90 and MAP > 60.   0420 - Verbal order received to exchange pt's foley.   9545 - Foley exchange completed. Pt tolerated well.

## 2024-03-15 NOTE — Progress Notes (Addendum)
 [facility] [place], [place] 03/15/24  Subjective:   Hospital day # 66 78 year old male with hypertension, heart failure with preserved ejection fraction, hyperlipidemia, stage IV CKD, obstructive sleep apnea, MGUS, chronic respiratory failure on 2 L oxygen  presented from [facility] with shortness of breath.  Transferred to [facility] for need of continuous dialysis.  Patient critically ill Pressor support- norepinephrine .  Ventilator support FiO2 40%/PEEP 5.   Sedated with precedex  OG tube, ET tube, Foley in place. Remains on CRRT, UF 60ml/hr Tube feeds held Urine output  Received IVF bolus overnight   Objective:  Vital signs in last 24 hours:  Temp:  [96.3 F (35.7 C)-98.9 F (37.2 C)] 97.3 F (36.3 C) (11/21 1100) Pulse Rate:  [59-87] 82 (11/21 1100) Resp:  [13-35] 17 (11/21 1100) BP: (116)/(56) 116/56 (11/21 0135) SpO2:  [84 %-100 %] 94 % (11/21 1100) Arterial Line BP: (89-165)/(36-69) 124/49 (11/21 1100) FiO2 (%):  [40 %] 40 % (11/21 0800) Weight:  [130.1 kg] 130.1 kg (11/21 0400)  Weight change: -5.7 kg Filed Weights   03/13/24 1200 03/14/24 0453 03/15/24 0400  Weight: 135.8 kg 135.1 kg 130.1 kg    Intake/Output:    Intake/Output Summary (Last 24 hours) at 03/15/2024 1154 Last data filed at 03/15/2024 1100 Gross per 24 hour  Intake 3828.07 ml  Output 4520 ml  Net -691.93 ml     Physical Exam: General: Critically ill-appearing  HEENT ET tube, OG tube in place  Pulm/lungs Ventilator assisted  CVS/Heart Tachycardic  Abdomen:  Nondistended  Extremities: Dependent edema is present  Neurologic:  sedated  Skin: Warm, dry  Access: Left femoral dialysis catheter       Basic Metabolic Panel:  Recent Labs  Lab 03/11/24 0429 03/11/24 1604 03/12/24 0412 03/12/24 1605 03/13/24 0346 03/13/24 1650 03/14/24 0324 03/14/24 1611 03/15/24 0135 03/15/24 0509  NA 135   < > 133*   < > 135 125* 135 130* 134* 134*  K 3.9   < > 4.1    < > 4.0 3.6 4.1 4.3 4.3 4.1  CL 103   < > 101   < > 100 93* 101 95* 101 100  CO2 25   < > 25   < > 26 21* 25 24 24 25   GLUCOSE 104*   < > 106*   < > 147* 429* 145* 220* 145* 157*  BUN 18   < > 12   < > 14 13 14 13 13 13   CREATININE 1.72*   < > 1.52*   < > 1.27* 1.25* 1.32* 1.35* 1.29* 1.27*  CALCIUM  8.5*   < > 8.4*   < > 8.8* 6.8* 8.7* 8.2* 8.9 8.9  MG 2.4  --  2.3  --  2.3  --  2.3  --  2.4  --   PHOS 2.3*   < > 2.5   < > 2.7 1.8* 2.9 2.6  --  2.3*   < > = values in this interval not displayed.     CBC: Recent Labs  Lab 03/08/24 1932 03/09/24 0435 03/11/24 0429 03/12/24 0412 03/13/24 0346 03/14/24 0324 03/15/24 0135  WBC 17.1*   < > 9.9 14.7*  15.0* 9.8 11.1* 12.2*  NEUTROABS 15.3*  --   --  13.2*  --   --   --   HGB 12.0*   < > 10.7* 11.2*  11.2* 11.0* 11.0* 11.2*  HCT 39.8   < > 33.5* 35.3*  35.3* 34.9* 34.9* 35.7*  MCV 107.6*   < > 99.7 102.0*  101.7* 102.3* 102.3* 102.0*  PLT 179   < > 79* 76*  75* 62* 84* 116*   < > = values in this interval not displayed.     No results found for: HEPBSAG, HEPBSAB, HEPBIGM    Microbiology:  Recent Results (from the past 240 hours)  Blood culture (routine x 2)     Status: None   Collection Time: 03/06/24  9:29 PM   Specimen: BLOOD  Result Value Ref Range Status   Specimen Description BLOOD RIGHT ANTECUBITAL  Final   Special Requests   Final    BOTTLES DRAWN AEROBIC AND ANAEROBIC Blood Culture adequate volume   Culture   Final    NO GROWTH 5 DAYS Performed at [facility], [address] [place], [place] [address]    Report Status 03/11/2024 FINAL  Final  Resp panel by RT-PCR (RSV, Flu A&B, Covid) Anterior Nasal Swab     Status: None   Collection Time: 03/06/24  9:33 PM   Specimen: Anterior Nasal Swab  Result Value Ref Range Status   SARS Coronavirus 2 by RT PCR NEGATIVE NEGATIVE Final    Comment: (NOTE) SARS-CoV-2 target nucleic acids are NOT DETECTED.  The SARS-CoV-2 RNA is generally detectable in upper  respiratory specimens during the acute phase of infection. The lowest concentration of SARS-CoV-2 viral copies this assay can detect is 138 copies/mL. A negative result does not preclude SARS-Cov-2 infection and should not be used as the sole basis for treatment or other patient management decisions. A negative result may occur with  improper specimen collection/handling, submission of specimen other than nasopharyngeal swab, presence of viral mutation(s) within the areas targeted by this assay, and inadequate number of viral copies(<138 copies/mL). A negative result must be combined with clinical observations, patient history, and epidemiological information. The expected result is Negative.  Fact Sheet for Patients:  bloggercourse.com  Fact Sheet for Healthcare Providers:  seriousbroker.it  This test is no t yet approved or cleared by the [place]  FDA and  has been authorized for detection and/or diagnosis of SARS-CoV-2 by FDA under an Emergency Use Authorization (EUA). This EUA will remain  in effect (meaning this test can be used) for the duration of the COVID-19 declaration under Section 564(b)(1) of the Act, 21 U.S.C.section 360bbb-3(b)(1), unless the authorization is terminated  or revoked sooner.       Influenza A by PCR NEGATIVE NEGATIVE Final   Influenza B by PCR NEGATIVE NEGATIVE Final    Comment: (NOTE) The Xpert Xpress SARS-CoV-2/FLU/RSV plus assay is intended as an aid in the diagnosis of influenza from Nasopharyngeal swab specimens and should not be used as a sole basis for treatment. Nasal washings and aspirates are unacceptable for Xpert Xpress SARS-CoV-2/FLU/RSV testing.  Fact Sheet for Patients: bloggercourse.com  Fact Sheet for Healthcare Providers: seriousbroker.it  This test is not yet approved or cleared by the [place]  FDA and has been  authorized for detection and/or diagnosis of SARS-CoV-2 by FDA under an Emergency Use Authorization (EUA). This EUA will remain in effect (meaning this test can be used) for the duration of the COVID-19 declaration under Section 564(b)(1) of the Act, 21 U.S.C. section 360bbb-3(b)(1), unless the authorization is terminated or revoked.     Resp Syncytial Virus by PCR NEGATIVE NEGATIVE Final    Comment: (NOTE) Fact Sheet for Patients: bloggercourse.com  Fact Sheet for Healthcare Providers: seriousbroker.it  This test is not yet approved or cleared by  the [place]  FDA and has been authorized for detection and/or diagnosis of SARS-CoV-2 by FDA under an Emergency Use Authorization (EUA). This EUA will remain in effect (meaning this test can be used) for the duration of the COVID-19 declaration under Section 564(b)(1) of the Act, 21 U.S.C. section 360bbb-3(b)(1), unless the authorization is terminated or revoked.  Performed at [facility], [address] [place], [place] [address]   Blood culture (routine x 2)     Status: None   Collection Time: 03/06/24 10:55 PM   Specimen: BLOOD  Result Value Ref Range Status   Specimen Description BLOOD BLOOD RIGHT HAND  Final   Special Requests   Final    BOTTLES DRAWN AEROBIC AND ANAEROBIC Blood Culture adequate volume   Culture   Final    NO GROWTH 5 DAYS Performed at [facility], [address] [place], [place] [address]    Report Status 03/11/2024 FINAL  Final  MRSA Next Gen by PCR, Nasal     Status: None   Collection Time: 03/07/24  1:43 AM   Specimen: Nasal Mucosa; Nasal Swab  Result Value Ref Range Status   MRSA by PCR Next Gen NOT DETECTED NOT DETECTED Final    Comment: (NOTE) The GeneXpert MRSA Assay (FDA approved for NASAL specimens only), is one component of a comprehensive MRSA colonization surveillance program. It is not intended to diagnose MRSA infection nor to guide or  monitor treatment for MRSA infections. Test performance is not FDA approved in patients less than 73 years old. Performed at [facility], [address] [place], [place] [address]   Respiratory (~20 pathogens) panel by PCR     Status: None   Collection Time: 03/07/24 11:18 AM   Specimen: Nasopharyngeal Swab; Respiratory  Result Value Ref Range Status   Adenovirus NOT DETECTED NOT DETECTED Final   Coronavirus 229E NOT DETECTED NOT DETECTED Final    Comment: (NOTE) The Coronavirus on the Respiratory Panel, DOES NOT test for the novel  Coronavirus (2019 nCoV)    Coronavirus HKU1 NOT DETECTED NOT DETECTED Final   Coronavirus NL63 NOT DETECTED NOT DETECTED Final   Coronavirus OC43 NOT DETECTED NOT DETECTED Final   Metapneumovirus NOT DETECTED NOT DETECTED Final   Rhinovirus / Enterovirus NOT DETECTED NOT DETECTED Final   Influenza A NOT DETECTED NOT DETECTED Final   Influenza B NOT DETECTED NOT DETECTED Final   Parainfluenza Virus 1 NOT DETECTED NOT DETECTED Final   Parainfluenza Virus 2 NOT DETECTED NOT DETECTED Final   Parainfluenza Virus 3 NOT DETECTED NOT DETECTED Final   Parainfluenza Virus 4 NOT DETECTED NOT DETECTED Final   Respiratory Syncytial Virus NOT DETECTED NOT DETECTED Final   Bordetella pertussis NOT DETECTED NOT DETECTED Final   Bordetella Parapertussis NOT DETECTED NOT DETECTED Final   Chlamydophila pneumoniae NOT DETECTED NOT DETECTED Final   Mycoplasma pneumoniae NOT DETECTED NOT DETECTED Final    Comment: Performed at [facility], [address] [place], [place] [address]  MRSA Next Gen by PCR, Nasal     Status: None   Collection Time: 03/08/24  7:23 PM   Specimen: Nasal Mucosa; Nasal Swab  Result Value Ref Range Status   MRSA by PCR Next Gen NOT DETECTED NOT DETECTED Final    Comment: (NOTE) The GeneXpert MRSA Assay (FDA approved for NASAL specimens only), is one component of a comprehensive MRSA colonization surveillance program. It is not intended to  diagnose MRSA infection nor to guide or monitor treatment for  MRSA infections. Test performance is not FDA approved in patients less than 72 years old. Performed at [facility], [address] [place], [place] [address]   Culture, Respiratory w Gram Stain     Status: None   Collection Time: 03/12/24  8:21 AM   Specimen: Tracheal Aspirate; Respiratory  Result Value Ref Range Status   Specimen Description   Final    TRACHEAL ASPIRATE Performed at [facility], [address] [place], [place] [address]    Special Requests   Final    NONE Performed at [facility], [address] [place], [place] [address]    Gram Stain   Final    RARE WBC PRESENT,BOTH PMN AND MONONUCLEAR FEW GRAM POSITIVE RODS    Culture   Final    MODERATE METHICILLIN RESISTANT STAPHYLOCOCCUS AUREUS WITHIN MIXED FLORA Performed at [facility], [address], [place], [place] [address]    Report Status 03/15/2024 FINAL  Final   Organism ID, Bacteria METHICILLIN RESISTANT STAPHYLOCOCCUS AUREUS  Final      Susceptibility   Methicillin resistant staphylococcus aureus - MIC*    CIPROFLOXACIN >=8 RESISTANT Resistant     ERYTHROMYCIN >=8 RESISTANT Resistant     GENTAMICIN <=0.5 SENSITIVE Sensitive     OXACILLIN >=4 RESISTANT Resistant     TETRACYCLINE <=1 SENSITIVE Sensitive     VANCOMYCIN  1 SENSITIVE Sensitive     TRIMETH/SULFA >=320 RESISTANT Resistant     CLINDAMYCIN <=0.25 SENSITIVE Sensitive     RIFAMPIN <=0.5 SENSITIVE Sensitive     Inducible Clindamycin NEGATIVE Sensitive     LINEZOLID  2 SENSITIVE Sensitive     * MODERATE METHICILLIN RESISTANT STAPHYLOCOCCUS AUREUS  MRSA Next Gen by PCR, Nasal     Status: Abnormal   Collection Time: 03/13/24 11:57 AM   Specimen: Nasal Mucosa; Nasal Swab  Result Value Ref Range Status   MRSA by PCR Next Gen DETECTED (A) NOT DETECTED Final    Comment: RESULT CALLED TO, READ BACK BY AND VERIFIED WITH: JORGE MORALES AT 1326 ON  03/13/24 BY SS (NOTE) The GeneXpert MRSA Assay (FDA approved for NASAL specimens only), is one component of a comprehensive MRSA colonization surveillance program. It is not intended to diagnose MRSA infection nor to guide or monitor treatment for MRSA infections. Test performance is not FDA approved in patients less than 78 years old. Performed at [facility], [address] [place], [place] [address]     Coagulation Studies: No results for input(s): LABPROT, INR in the last 72 hours.   Urinalysis: No results for input(s): COLORURINE, LABSPEC, PHURINE, GLUCOSEU, HGBUR, BILIRUBINUR, KETONESUR, PROTEINUR, UROBILINOGEN, NITRITE, LEUKOCYTESUR in the last 72 hours.  Invalid input(s): APPERANCEUR    Imaging: No results found.    Medications:    dexmedetomidine  (PRECEDEX ) IV infusion 0.4 mcg/kg/hr (03/15/24 1100)   feeding supplement (VITAL HIGH PROTEIN) Stopped (03/15/24 0910)   norepinephrine  (LEVOPHED ) Adult infusion 11 mcg/min (03/15/24 1100)   piperacillin -tazobactam (ZOSYN )  IV 3.375 g (03/15/24 1107)   prismasol  BGK 4/2.5 400 mL/hr at 03/15/24 0835   prismasol  BGK 4/2.5 400 mL/hr at 03/15/24 0834   prismasol  BGK 4/2.5 2,500 mL/hr at 03/15/24 0955   propofol  (DIPRIVAN ) infusion Stopped (03/15/24 0902)   vancomycin       Chlorhexidine  Gluconate Cloth  6 each Topical Daily   free water   30 mL Per Tube Q4H   insulin  aspart  0-6 Units Subcutaneous Q4H   ipratropium-albuterol   3 mL Nebulization TID  multivitamin  1 tablet Per Tube QHS   mupirocin  ointment  1 Application Nasal BID   nystatin   5 mL Oral QID   mouth rinse  15 mL Mouth Rinse Q2H   pantoprazole  (PROTONIX ) IV  40 mg Intravenous QHS   sertraline   25 mg Per Tube Daily   sodium chloride  flush  10-40 mL Intracatheter Q12H   thiamine   100 mg Per Tube Daily   docusate sodium , fentaNYL  (SUBLIMAZE ) injection, heparin , heparin , ipratropium-albuterol , mouth rinse, polyethylene  glycol, sodium chloride  flush  Assessment/ Plan:  78 y.o. male with    Obesity, HFpEF and obstructive sleep apnea/COPD requiring 2L supplemental oxygen , hyperlipidemia, chronic kidney disease stage IV, MGUS, hypertension  admitted on 03/08/2024 for Acute and chronic respiratory failure with hypoxia [J96.21] Hypotension [I95.9] Acute on chronic respiratory failure with hypoxia and hypercapnia (HCC) [G03.78, J96.22]  Acute kidney injury on chronic kidney disease stage IV.  Baseline creatinine of 2.5-2.8. Acute kidney injury is thought to be secondary to pneumonia and circulatory shock causing ATN.  Patient responded well to IV Lasix  03/13/24.  However due to low CVP, further doses were held. Ultrafiltration goal was reduced to -50 cc/h then net 0  Plan -CRRT, net UF 0 due to hypotension and arhythmia  - Will stop CRRT later today and assess daily for need for hemodialysis - Pressor support per primary team.  Electrolytes and volume status are maintained.  Continue potassium bath of 4K.   Acute respiratory failure Currently requiring ventilator support.  FiO2 40%/PEEP 5 Respiratory pathogen's panel by PCR is negative.  Blood cultures from 03/06/2024 are negative.    LOS: 7 Faith Harris 11/21/202511:54 AM  [facility] [place], [place] [phone]  Patient was seen and examined with Faith Harris, NP.  Plan of care was formulated for the problems addressed and discussed with NP.  I agree with the note as documented except as noted below.

## 2024-03-15 NOTE — Progress Notes (Signed)
 Rounding Note   Patient Name: Dave Brown Date of Encounter: 03/15/2024  [facility] Cardiologist: Newman JINNY Lawrence, MD   Subjective Remains intubated, sedated On CRRT, UF -1300 yesterday Amiodarone  discontinued due to AIVR seen on telemetry Heparin  discontinued due to bloody secretions CVP low around 6 this a.m. Warm and perfusing  Scheduled Meds:  Chlorhexidine  Gluconate Cloth  6 each Topical Daily   free water   30 mL Per Tube Q4H   insulin  aspart  0-6 Units Subcutaneous Q4H   ipratropium-albuterol   3 mL Nebulization TID   multivitamin  1 tablet Per Tube QHS   mupirocin  ointment  1 Application Nasal BID   nystatin   5 mL Oral QID   mouth rinse  15 mL Mouth Rinse Q2H   pantoprazole  (PROTONIX ) IV  40 mg Intravenous QHS   sertraline   25 mg Per Tube Daily   sodium chloride  flush  10-40 mL Intracatheter Q12H   thiamine   100 mg Per Tube Daily   Continuous Infusions:  feeding supplement (VITAL HIGH PROTEIN) 70 mL/hr at 03/15/24 0800   lactated ringers      norepinephrine  (LEVOPHED ) Adult infusion 12 mcg/min (03/15/24 0800)   piperacillin -tazobactam (ZOSYN )  IV Stopped (03/15/24 0545)   prismasol  BGK 4/2.5 400 mL/hr at 03/14/24 1933   prismasol  BGK 4/2.5 400 mL/hr at 03/14/24 1932   prismasol  BGK 4/2.5 2,500 mL/hr at 03/15/24 0747   propofol  (DIPRIVAN ) infusion 30 mcg/kg/min (03/15/24 0800)   vancomycin      vasopressin      PRN Meds: docusate sodium , fentaNYL  (SUBLIMAZE ) injection, heparin , heparin , ipratropium-albuterol , mouth rinse, polyethylene glycol, sodium chloride  flush   Vital Signs  Vitals:   03/15/24 0630 03/15/24 0645 03/15/24 0700 03/15/24 0742  BP:      Pulse: 68 67 66   Resp: (!) 24 (!) 24 (!) 23   Temp: (!) 97.5 F (36.4 C) (!) 97.5 F (36.4 C) (!) 97.5 F (36.4 C)   TempSrc:      SpO2: 96% 96% 96% 95%  Weight:      Height:        Intake/Output Summary (Last 24 hours) at 03/15/2024 0826 Last data filed at 03/15/2024 0804 Gross per  24 hour  Intake 3393.84 ml  Output 4700 ml  Net -1306.16 ml      03/15/2024    4:00 AM 03/14/2024    4:53 AM 03/13/2024   12:00 PM  Last 3 Weights  Weight (lbs) 286 lb 13.1 oz 297 lb 13.5 oz 299 lb 6.2 oz  Weight (kg) 130.1 kg 135.1 kg 135.8 kg      Telemetry 1 short run of AIVR  ECG   - Personally Reviewed; sinus rhythm with first-degree AV block and prolonged QT  Physical Exam  GEN: Intubated, sedated Neck: Unable to estimate JVD Cardiac: RRR, no murmurs, rubs, or gallops.  Respiratory: Clear to auscultation bilaterally. GI: Soft,  non-distended  MS: No edema; No deformity. Neuro:  Nonfocal  Psych: Normal affect   Labs High Sensitivity Troponin:  No results for input(s): TROPONINIHS in the last 720 hours.   Chemistry Recent Labs  Lab 03/08/24 1932 03/09/24 0435 03/12/24 0412 03/12/24 1605 03/13/24 0346 03/13/24 1650 03/14/24 0324 03/14/24 1611 03/15/24 0135 03/15/24 0509  NA 133*   < > 133*   < > 135   < > 135 130* 134* 134*  K 5.4*   < > 4.1   < > 4.0   < > 4.1 4.3 4.3 4.1  CL 94*   < >  101   < > 100   < > 101 95* 101 100  CO2 27   < > 25   < > 26   < > 25 24 24 25   GLUCOSE 142*   < > 106*   < > 147*   < > 145* 220* 145* 157*  BUN 74*   < > 12   < > 14   < > 14 13 13 13   CREATININE 6.62*   < > 1.52*   < > 1.27*   < > 1.32* 1.35* 1.29* 1.27*  CALCIUM  9.1   < > 8.4*   < > 8.8*   < > 8.7* 8.2* 8.9 8.9  MG 2.7*   < > 2.3  --  2.3  --  2.3  --  2.4  --   PROT 7.0  --  5.8*  --   --   --   --   --   --   --   ALBUMIN 3.9   < > 3.1*  3.0*   < > 3.1*   < > 3.1* 2.6*  --  3.0*  AST 23  --  14*  --   --   --   --   --   --   --   ALT 20  --  12  --   --   --   --   --   --   --   ALKPHOS 90  --  80  --   --   --   --   --   --   --   BILITOT 0.4  --  0.5  --   --   --   --   --   --   --   GFRNONAA 8*   < > 47*   < > 58*   < > 55* 54* 57* 58*  ANIONGAP 12   < > 7   < > 8   < > 10 12 10 10    < > = values in this interval not displayed.    Lipids  Recent  Labs  Lab 03/13/24 0346  TRIG 128    Hematology Recent Labs  Lab 03/13/24 0346 03/14/24 0324 03/15/24 0135  WBC 9.8 11.1* 12.2*  RBC 3.41* 3.41* 3.50*  HGB 11.0* 11.0* 11.2*  HCT 34.9* 34.9* 35.7*  MCV 102.3* 102.3* 102.0*  MCH 32.3 32.3 32.0  MCHC 31.5 31.5 31.4  RDW 14.2 14.4 14.6  PLT 62* 84* 116*   Thyroid  No results for input(s): TSH, FREET4 in the last 168 hours.   BNPNo results for input(s): BNP, PROBNP in the last 168 hours.  DDimer No results for input(s): DDIMER in the last 168 hours.   Radiology  No results found.  Cardiac Studies -TTE 03/07/2024 LVEF 60 to 65% with severe LVH, grade 3 diastolic dysfunction, no significant valvular disease -CT chest 02/2024 with severe aortic atherosclerosis and severe three-vessel coronary artery calcifications - TTE 01/2024 LVEF 60 to 65%, severe LVH, grade 3 diastolic dysfunction  Patient Profile   78 y.o. male   Assessment & Plan  Acute on chronic respiratory failure HFpEF Undifferentiated shock Somewhat unclear cause.  Does have significantly elevated BNP in setting of renal failure.  EF has been preserved on previous echocardiograms.  He is warm and perfusing today with a low CVP and wide pulse pressure.  He has a persistent low pressor requirement.  No obvious infectious cause established  or other clear etiology of his shock state.  Do think there is a component of HFpEF regarding his hypoxemic respiratory failure, but I do not think that this is a significant contributor to his shock state.  His echocardiogram shows severe LVH with grade 3 diastolic dysfunction; do wonder about cardiac amyloidosis.  Unfortunately he has not been well enough to undergo further evaluation for this issue.  Chronic renal failure limits options.  Plan: - Continue CRRT for volume management; target CVP less than 10 - Agree with empiric antibiotics - If/when he recovers, he should undergo a cardiac amyloidosis evaluation - Hold  GDMT for now if an ongoing pressor requirement   Acute on chronic renal failure -Continue CRRT   Acute on chronic diastolic CHF Normal EF with severe LVH, restrictive physiology - As above, he should have a cardiac amyloidosis and other restrictive cardiomyopathy evaluation once he has recovered, though renal failure limits treatment options - Unlikely to be a candidate for SGLT-2 or MRA given chronic renal failure   Elevated troponin Coronary artery calcifications Aortic atherosclerosis Up to 200, likely supply/demand mismatch in the setting of respiratory failure - No plans for ischemic evaluation given chronic renal failure.  He likely has underlying CAD given his severe three-vessel CAC, but options are limited   Paroxysmal atrial fibrillation AIVR Rapid atrial fibrillation 2 nights ago.  Amiodarone  and heparin  started.  Amiodarone  discontinued overnight due to episode of AIVR.  Has had some ongoing bloody secretions so heparin  also stopped.  Plan: - AIVR is generally benign, can continue to monitor this for now - If he has recurrence of rapid atrial fibrillation, amiodarone  is likely our only option - Will need to continue holding heparin  for now given ongoing bleeding - Limited options    For questions or updates, please contact [facility] Please consult [email or web address] for contact info under     Signed, Caron Poser, MD  03/15/2024, 8:26 AM

## 2024-03-15 NOTE — Consult Note (Signed)
 PHARMACY - ANTICOAGULATION CONSULT NOTE  Pharmacy Consult for heparin  infusion Indication: atrial fibrillation  No Known Allergies  Patient Measurements: Height: 5' 9 (175.3 cm) Weight: 130.1 kg (286 lb 13.1 oz) IBW/kg (Calculated) : 70.7  Vital Signs: Temp: 98.1 F (36.7 C) (11/21 0400) Temp Source: Esophageal (11/21 0315) Pulse Rate: 78 (11/21 0400)  Labs: Recent Labs    03/13/24 0346 03/13/24 1603 03/14/24 0122 03/14/24 0324 03/14/24 0906 03/14/24 1611 03/15/24 0135  HGB 11.0*  --   --  11.0*  --   --  11.2*  HCT 34.9*  --   --  34.9*  --   --  35.7*  PLT 62*  --   --  84*  --   --  116*  APTT 52*  --   --  152*  --   --  91*  HEPARINUNFRC  --    < > 0.64  --  0.34  --  0.34  CREATININE 1.27*   < >  --  1.32*  --  1.35* 1.29*   < > = values in this interval not displayed.    Estimated Creatinine Clearance: 63.1 mL/min (A) (by C-G formula based on SCr of 1.29 mg/dL (H)).   Medical History: Past Medical History:  Diagnosis Date   CHF (congestive heart failure) (HCC)    Degenerative joint disease    High cholesterol    Hypertension    Kidney stones    Obesity    Respiratory failure with hypoxia (HCC)    Sleep apnea     Medications:  No anticoagulation PTA  Assessment: Patient is a 78 y/o male with a PMH significant for HFpEF, COPD, and acute on chronic hypoxic respiratory failure, currently with AKI requiring CRRT.  Pharmacy is consulted for initiation and management of a heparin  infusion ISO atrial fibrillation  Baseline labs WNL: Hbg 11.6, PLT 153, INR 1.0, aPTT 29  Goal of Therapy:  Heparin  level 0.3-0.7 units/ml Monitor platelets by anticoagulation protocol: Yes  Plan:  11/21:  HL @ 0135 = 0.34, therapeutic X 3 - Will continue pt on current rate and recheck HL on 11/22 with AM labs - Per MD: no heparin  bolus  CBC daily while on heparin   Dail Lerew D, PharmD Clinical Pharmacist 03/15/2024 4:26 AM

## 2024-03-15 NOTE — Progress Notes (Signed)
 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 He denied any fevers, chills, chest pain.     Of note, the patient was recently admitted to the hospital from 02/01/2024 to 02/05/2024 for respiratory failure due to CHF.  The patient did not require oxygen  at the time of his discharge.  His discharge weight was 286.  At his last office visit with his primary provider his weight was 291 pounds on 02/22/24.  Follow up with pulmonary on 03/04/24 office weight 29.   He was discharged home with furosemide  40 mg daily during last hospitalization   ED Course: Initial Vital Signs: afebrile and hemodynamically stable. Oxygen  saturation was 93-97% on 4 L  Significant Labs: WBC 11.0, hemoglobin 12.3, platelets 138. Sodium 141, potassium 4.1, bicarbonate 31, serum creatinine 4.15. proBNP 16,139. PCT 0.13. Lactic acid 1.9 >> 0.8. COVID-negative  Imaging Chest X-ray>>patchy opacities right lower lobe and left lower lobe  Medications Administered: furosemide  40 mg IV, Solu-Medrol  125 mg, ceftriaxone , and azithromycin . He was given albuterol  and Atrovent .    [facility] asked to admit for further workup and treatment.  He gradually became hypotensive requiring initiation of low dose Levophed  (3 to 5 mcg), PCCM was consulted.  Nephrology was consulted for oliguria. Case was discussed with nephrology and it was felt the patient need to be initiated on CRRT. Subsequently, initial request was made to transfer the patient to [facility]. In order to expedite care, he is being transferred to [facility] regional ICU to critical care service where  nephrology will be consulted for continued management of his renal failure and fluid overload.   Pertinent  Medical History  As above.   Significant Hospital Events: Including procedures, antibiotic start and stop dates in addition to other pertinent events   11/12: Admitted to [facility] to [place] for treatment of Acute Decompensated HFpEF and AKI. 11/13: BP decreased, started on Levophed .  PCCM consulted.  ABX discontinued. 11/14: Transfer to [facility] for initiation of CRRT.  11/15: seizure like episode, intubated 11/16: Unable to wean; PICC line placed  Interim History / Subjective:  -- Overnight with Bloody secretions and wide complex rhythm. Amiodarone  held. Heparin  drip held this AM.  -- CRRT switched to running even as he had increased pressor requirements. CVP 7.  -- NE 12 mc/min this AM.    Objective    Blood pressure (!) 116/56, pulse 66, temperature (!) 97.5 F (36.4 C), resp. rate (!) 23, height 5' 9 (1.753 m), weight 130.1 kg, SpO2 95%. CVP:  [8 mmHg-40 mmHg] 14 mmHg  Vent Mode: PRVC FiO2 (%):  [40 %] 40 % Set Rate:  [22 bmp] 22 bmp Vt Set:  [480 mL] 480 mL PEEP:  [5 cmH20] 5 cmH20 Pressure Support:  [10 cmH20] 10 cmH20 Plateau Pressure:  [17 cmH20-18 cmH20] 18 cmH20   Intake/Output Summary (Last 24 hours) at 03/15/2024 0834 Last data filed at 03/15/2024 0804 Gross per 24 hour  Intake 3393.84 ml  Output 4700 ml  Net -1306.16 ml   Filed Weights   03/13/24 1200 03/14/24 0453 03/15/24 0400  Weight: 135.8 kg 135.1 kg 130.1  kg    Examination: General: Intubated and sedated HENT: Supple neck, reactive pupils, EOMI  Lungs: Coarse breath sounds bilaterally  Cardiovascular: Normal S1, Normal S2, RRR Abdomen: Soft, non tender, non distended, +BS  Extremities: Warm, No edema  Labs and imaging were reviewed.   Assessment and Plan  #Acute Hypoxic Respiratory Failure requiring intubation and MV 11/15 #Acute on Chronic HFpEF #Acute Renal Failure on CKD #Cardiogenic  Shock #T2DM #Toxic Metabolic Encephalopathy #Paroxysmal A.fib    Neuro - Encephalopathic secondary to hypercarbia, without improvement following BiPAP. Required dexmedetomidine  for sedation to tolerate care (CRRT, BIPAP, etc...). Patient developed as seizure like episode with further encephalopathy and respiratory distress. EEG Normal. CT head unremarkable. Possibole due  to hypercapnic resp failure. SAT this am.  CV - Decompensated HFpEF with anasarca and concomitant renal failure. TTE with severe LVH and diastolic dysfunction, suspect also component of pulmonary hypertension from untreated OSA/OHS.  Appreciate input from cardiology. Continue to titrate vasopressors for goal MAP > 65 mmHg. Pulm - Intubated due to worsening encephalopathy as well as worsening respiratory failure with hypoxia and hypercapnia. CRRT for volume mobilization, and now vented, trending blood gas to ensure ventilation and oxygenation. SBT this AM.  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 Started on CRRT. Total negative -9L. Running Even as increased NE requirements. LR 500cc x1 bolus.  Endo - ICU glycemic protocol. Hem/Onc - Hold heparin  drip for now given bloody secretions.  ID -  WBC uptrending and SvO2 82%. Trach aspirate with staph aureus. MRSA Swab +.  Blood cultures pending negative so far.  Started on Zosyn  11/18 will add vancomycin  given MRSA PCR +.    Critical care time: 45 minutes     Darrin Barn, MD [name] 03/15/2024 8:34 AM

## 2024-03-15 NOTE — Plan of Care (Signed)
  Problem: Clinical Measurements: Goal: Ability to maintain clinical measurements within normal limits will improve Outcome: Not Progressing Goal: Will remain free from infection Outcome: Not Progressing Goal: Diagnostic test results will improve Outcome: Not Progressing Goal: Respiratory complications will improve Outcome: Not Progressing Goal: Cardiovascular complication will be avoided Outcome: Not Progressing   Problem: Nutrition: Goal: Adequate nutrition will be maintained Outcome: Progressing   Problem: Coping: Goal: Level of anxiety will decrease Outcome: Not Progressing   Problem: Elimination: Goal: Will not experience complications related to bowel motility Outcome: Progressing Goal: Will not experience complications related to urinary retention Outcome: Progressing   Problem: Pain Managment: Goal: General experience of comfort will improve and/or be controlled Outcome: Progressing   Problem: Nutritional: Goal: Progress toward achieving an optimal weight will improve Outcome: Progressing

## 2024-03-15 NOTE — Plan of Care (Signed)

## 2024-03-16 ENCOUNTER — Inpatient Hospital Stay (HOSPITAL_COMMUNITY)
Admission: AD | Admit: 2024-03-16 | Discharge: 2024-03-16 | Disposition: A | Discharge status: Home / Self Care | Source: Intra-hospital

## 2024-03-16 DIAGNOSIS — J9601 Acute respiratory failure with hypoxia: Secondary | ICD-10-CM

## 2024-03-16 DIAGNOSIS — R578 Other shock: Secondary | ICD-10-CM | POA: Diagnosis not present

## 2024-03-16 DIAGNOSIS — I4729 Other ventricular tachycardia: Secondary | ICD-10-CM

## 2024-03-16 DIAGNOSIS — I5033 Acute on chronic diastolic (congestive) heart failure: Secondary | ICD-10-CM

## 2024-03-16 LAB — BLOOD GAS, ARTERIAL
Acid-Base Excess: 1.2 mmol/L (ref 0.0–2.0)
Acid-base deficit: 0.8 mmol/L (ref 0.0–2.0)
Bicarbonate: 24.3 mmol/L (ref 20.0–28.0)
Bicarbonate: 26 mmol/L (ref 20.0–28.0)
Delivery systems: POSITIVE
Expiratory PAP: 8 cmH2O
FIO2: 28 %
FIO2: 30 %
Inspiratory PAP: 14 cmH2O
O2 Content: 50 L/min
O2 Saturation: 100 %
O2 Saturation: 99.6 %
Patient temperature: 37
Patient temperature: 37
pCO2 arterial: 41 mmHg (ref 32–48)
pCO2 arterial: 41 mmHg (ref 32–48)
pH, Arterial: 7.38 (ref 7.35–7.45)
pH, Arterial: 7.41 (ref 7.35–7.45)
pO2, Arterial: 92 mmHg (ref 83–108)
pO2, Arterial: 167 mmHg — ABNORMAL HIGH (ref 83–108)

## 2024-03-16 LAB — CBC
HCT: 33.2 % — ABNORMAL LOW (ref 39.0–52.0)
Hemoglobin: 10.6 g/dL — ABNORMAL LOW (ref 13.0–17.0)
MCH: 32.3 pg (ref 26.0–34.0)
MCHC: 31.9 g/dL (ref 30.0–36.0)
MCV: 101.2 fL — ABNORMAL HIGH (ref 80.0–100.0)
Platelets: 106 K/uL — ABNORMAL LOW (ref 150–400)
RBC: 3.28 MIL/uL — ABNORMAL LOW (ref 4.22–5.81)
RDW: 14.1 % (ref 11.5–15.5)
WBC: 7.6 K/uL (ref 4.0–10.5)
nRBC: 0 % (ref 0.0–0.2)

## 2024-03-16 LAB — COOXEMETRY PANEL
Carboxyhemoglobin: 1.3 % (ref 0.5–1.5)
Carboxyhemoglobin: 1.8 % — ABNORMAL HIGH (ref 0.5–1.5)
Carboxyhemoglobin: 1.8 % — ABNORMAL HIGH (ref 0.5–1.5)
Carboxyhemoglobin: 1.9 % — ABNORMAL HIGH (ref 0.5–1.5)
Carboxyhemoglobin: 2.6 % — ABNORMAL HIGH (ref 0.5–1.5)
Methemoglobin: 1.2 % (ref 0.0–1.5)
Methemoglobin: <0.7 % (ref 0.0–1.5)
Methemoglobin: <0.7 % (ref 0.0–1.5)
Methemoglobin: <0.7 % (ref 0.0–1.5)
Methemoglobin: <0.7 % (ref 0.0–1.5)
O2 Saturation: 63.6 %
O2 Saturation: 71.6 %
O2 Saturation: 81.5 %
O2 Saturation: 99.2 %
O2 Saturation: 99.8 %
Total hemoglobin: 10.5 g/dL — ABNORMAL LOW (ref 12.0–16.0)
Total hemoglobin: 12.3 g/dL (ref 12.0–16.0)
Total hemoglobin: 14.1 g/dL (ref 12.0–16.0)
Total hemoglobin: 16.6 g/dL — ABNORMAL HIGH (ref 12.0–16.0)
Total hemoglobin: 16.9 g/dL — ABNORMAL HIGH (ref 12.0–16.0)
Total oxygen content: 62.5 %
Total oxygen content: 70.2 %
Total oxygen content: 79 %
Total oxygen content: 97 %
Total oxygen content: 97 %

## 2024-03-16 LAB — GLUCOSE, CAPILLARY
Glucose-Capillary: 100 mg/dL — ABNORMAL HIGH (ref 70–99)
Glucose-Capillary: 101 mg/dL — ABNORMAL HIGH (ref 70–99)
Glucose-Capillary: 106 mg/dL — ABNORMAL HIGH (ref 70–99)
Glucose-Capillary: 107 mg/dL — ABNORMAL HIGH (ref 70–99)
Glucose-Capillary: 123 mg/dL — ABNORMAL HIGH (ref 70–99)
Glucose-Capillary: 133 mg/dL — ABNORMAL HIGH (ref 70–99)

## 2024-03-16 LAB — ECHOCARDIOGRAM LIMITED
AV Mean grad: 8 mmHg
AV Peak grad: 13.7 mmHg
Ao pk vel: 1.85 m/s
Area-P 1/2: 3.53 cm2
Est EF: >75
Height: 69 in
S' Lateral: 3 cm
Weight: 4448 [oz_av]

## 2024-03-16 LAB — RENAL FUNCTION PANEL
Albumin: 3.1 g/dL — ABNORMAL LOW (ref 3.5–5.0)
Anion gap: 11 (ref 5–15)
BUN: 22 mg/dL (ref 8–23)
CO2: 23 mmol/L (ref 22–32)
Calcium: 9.6 mg/dL (ref 8.9–10.3)
Chloride: 103 mmol/L (ref 98–111)
Creatinine, Ser: 2.13 mg/dL — ABNORMAL HIGH (ref 0.61–1.24)
GFR, Estimated: 31 mL/min — ABNORMAL LOW (ref >60)
Glucose, Bld: 122 mg/dL — ABNORMAL HIGH (ref 70–99)
Phosphorus: 2.7 mg/dL (ref 2.5–4.6)
Potassium: 4.1 mmol/L (ref 3.5–5.1)
Sodium: 136 mmol/L (ref 135–145)

## 2024-03-16 LAB — TROPONIN T, HIGH SENSITIVITY
Troponin T High Sensitivity: 308 ng/L (ref 0–19)
Troponin T High Sensitivity: 355 ng/L (ref 0–19)
Troponin T High Sensitivity: 353 ng/L (ref 0–19)

## 2024-03-16 LAB — VITAMIN B12: Vitamin B-12: 276 pg/mL (ref 180–914)

## 2024-03-16 LAB — HEPARIN LEVEL (UNFRACTIONATED)
Heparin Unfractionated: <0.1 [IU]/mL — ABNORMAL LOW (ref 0.30–0.70)
Heparin Unfractionated: 0.24 [IU]/mL — ABNORMAL LOW (ref 0.30–0.70)

## 2024-03-16 LAB — FOLATE: Folate: 8 ng/mL (ref >5.9)

## 2024-03-16 LAB — TRIGLYCERIDES: Triglycerides: 155 mg/dL — ABNORMAL HIGH (ref <150)

## 2024-03-16 LAB — MAGNESIUM: Magnesium: 2.4 mg/dL (ref 1.7–2.4)

## 2024-03-16 LAB — VANCOMYCIN, RANDOM: Vancomycin Rm: 17 ug/mL

## 2024-03-16 LAB — HEPATITIS B SURFACE ANTIGEN: Hepatitis B Surface Ag: NONREACTIVE

## 2024-03-16 MED ORDER — ALTEPLASE 2 MG IJ SOLR
2.0000 mg | Freq: Once | INTRAMUSCULAR | Status: AC | PRN
  Administered 2024-03-21: 2 mg
  Filled 2024-03-16: qty 2

## 2024-03-16 MED ORDER — VANCOMYCIN HCL IN DEXTROSE 1-5 GM/200ML-% IV SOLN
1000.0000 mg | Freq: Once | INTRAVENOUS | Status: AC
  Administered 2024-03-16: 1000 mg via INTRAVENOUS
  Filled 2024-03-16: qty 200

## 2024-03-16 MED ORDER — HEPARIN SODIUM (PORCINE) 1000 UNIT/ML DIALYSIS: 1000.0000 [IU] | INTRAMUSCULAR | Status: DC | PRN

## 2024-03-16 MED ORDER — ANTICOAGULANT SODIUM CITRATE 4% (200MG/5ML) IV SOLN: 5.0000 mL | Status: DC | PRN

## 2024-03-16 MED ORDER — HEPARIN (PORCINE) 25000 UT/250ML-% IV SOLN
1600.0000 [IU]/h | INTRAVENOUS | Status: DC
  Administered 2024-03-16: 1500 [IU]/h via INTRAVENOUS
  Administered 2024-03-17: 1600 [IU]/h via INTRAVENOUS
  Filled 2024-03-16 (×2): qty 250

## 2024-03-16 MED ORDER — THIAMINE HCL 100 MG/ML IJ SOLN
100.0000 mg | Freq: Every day | INTRAMUSCULAR | Status: AC
  Administered 2024-03-16 – 2024-03-17 (×2): 100 mg via INTRAVENOUS
  Filled 2024-03-16 (×2): qty 2

## 2024-03-16 MED ORDER — LACTATED RINGERS IV BOLUS
500.0000 mL | Freq: Once | INTRAVENOUS | Status: AC
  Administered 2024-03-16: 500 mL via INTRAVENOUS

## 2024-03-16 MED ORDER — HEPARIN SODIUM (PORCINE) 5000 UNIT/ML IJ SOLN: 5000.0000 [IU] | Freq: Three times a day (TID) | INTRAMUSCULAR | Status: DC

## 2024-03-16 MED ORDER — ORAL CARE MOUTH RINSE
15.0000 mL | OROMUCOSAL | Status: DC | PRN
Start: 2024-03-16 — End: 2024-03-18

## 2024-03-16 MED ORDER — CHLORHEXIDINE GLUCONATE CLOTH 2 % EX PADS
6.0000 | MEDICATED_PAD | Freq: Every day | CUTANEOUS | Status: DC
  Administered 2024-03-17 – 2024-04-05 (×20): 6 via TOPICAL

## 2024-03-16 MED ORDER — HEPARIN BOLUS VIA INFUSION
1000.0000 [IU] | Freq: Once | INTRAVENOUS | Status: AC
  Administered 2024-03-16: 1000 [IU] via INTRAVENOUS
  Filled 2024-03-16: qty 1000

## 2024-03-16 NOTE — Progress Notes (Signed)
  Echocardiogram 2D Echocardiogram has been performed.  Dave Brown 03/16/2024, 9:05 AM

## 2024-03-16 NOTE — Plan of Care (Signed)
  Problem: Clinical Measurements: Goal: Ability to maintain clinical measurements within normal limits will improve Outcome: Progressing Goal: Will remain free from infection Outcome: Progressing Goal: Diagnostic test results will improve Outcome: Progressing Goal: Respiratory complications will improve Outcome: Progressing   Problem: Activity: Goal: Risk for activity intolerance will decrease Outcome: Progressing   Problem: Safety: Goal: Ability to remain free from injury will improve Outcome: Progressing   Problem: Metabolic: Goal: Ability to maintain appropriate glucose levels will improve Outcome: Progressing   Problem: Education: Goal: Knowledge of General Education information will improve Description: Including pain rating scale, medication(s)/side effects and non-pharmacologic comfort measures Outcome: Not Progressing   Problem: Health Behavior/Discharge Planning: Goal: Ability to manage health-related needs will improve Outcome: Not Progressing   Problem: Clinical Measurements: Goal: Cardiovascular complication will be avoided Outcome: Not Progressing   Problem: Nutrition: Goal: Adequate nutrition will be maintained Outcome: Not Progressing   Problem: Coping: Goal: Level of anxiety will decrease Outcome: Not Progressing   Problem: Health Behavior/Discharge Planning: Goal: Ability to manage health-related needs will improve Outcome: Not Progressing   Problem: Nutritional: Goal: Maintenance of adequate nutrition will improve Outcome: Not Progressing

## 2024-03-16 NOTE — Progress Notes (Signed)
 Pharmacy Antibiotic Note  Dave Brown is a 78 y.o. male w/ PMH of HFpEF, HLD, COPD, depression admitted on 03/08/2024 with AKI and now pneumonia. Pharmacy has been consulted for vancomycin  dosing. Previously on CRRT which was stopped 11/21  Plan:   Vancomycin  random this AM was 17. Give vancomycin  1 g IV. Re-check a level tomorrow AM. Plan for 10d of therapy per PCCM team  Height: 5' 9 (175.3 cm) Weight: 126.1 kg (278 lb) IBW/kg (Calculated) : 70.7  Temp (24hrs), Avg:97.9 F (36.6 C), Min:97.3 F (36.3 C), Max:98.7 F (37.1 C)  Recent Labs  Lab 03/12/24 0412 03/12/24 1605 03/13/24 0346 03/13/24 1650 03/14/24 0324 03/14/24 1611 03/15/24 0135 03/15/24 0509 03/15/24 1607 03/16/24 0414  WBC 14.7*  15.0*  --  9.8  --  11.1*  --  12.2*  --   --  7.6  CREATININE 1.52*   < > 1.27*   < > 1.32* 1.35* 1.29* 1.27* 1.53* 2.13*  LATICACIDVEN  --   --   --   --   --   --  0.7  --   --   --   VANCORANDOM  --   --   --   --   --   --   --   --   --  17   < > = values in this interval not displayed.    Estimated Creatinine Clearance: 37.6 mL/min (A) (by C-G formula based on SCr of 2.13 mg/dL (H)).    No Known Allergies  Antimicrobials this admission: 11/18 Zosyn  >> 11/22 11/20 vancomycin  >>   Microbiology results: 11/12 BCx: NG final 11/18 Sputum: MRSA 11/19 MRSA PCR: positive  Thank you for allowing pharmacy to be a part of this patient's care.  Marolyn KATHEE Mare 03/16/2024 11:07 AM

## 2024-03-16 NOTE — Progress Notes (Addendum)
 Rounding Note   Patient Name: Dave Brown Date of Encounter: 03/16/2024  [facility] Cardiologist: Newman JINNY Lawrence, MD   Subjective Extubated yesterday Ongoing high Levophed  requirement Dobutamine  started after Co. ox of 69 returned.  Lactic was normal. CVP 12-15  Scheduled Meds:  Chlorhexidine  Gluconate Cloth  6 each Topical Daily   insulin  aspart  0-6 Units Subcutaneous Q4H   ipratropium-albuterol   3 mL Nebulization TID   multivitamin  1 tablet Per Tube QHS   mupirocin  ointment  1 Application Nasal BID   nystatin   5 mL Oral QID   mouth rinse  15 mL Mouth Rinse 4 times per day   pantoprazole  (PROTONIX ) IV  40 mg Intravenous QHS   sertraline   25 mg Per Tube Daily   thiamine   100 mg Per Tube Daily   vancomycin  variable dose per unstable renal function (pharmacist dosing)   Does not apply See admin instructions   Continuous Infusions:  dexmedetomidine  (PRECEDEX ) IV infusion 1.2 mcg/kg/hr (03/16/24 0700)   DOBUTamine  5 mcg/kg/min (03/16/24 0700)   norepinephrine  (LEVOPHED ) Adult infusion 18 mcg/min (03/16/24 0700)   piperacillin -tazobactam (ZOSYN )  IV 12.5 mL/hr at 03/16/24 0700   PRN Meds: docusate sodium , fentaNYL  (SUBLIMAZE ) injection, heparin , heparin , ipratropium-albuterol , mouth rinse, mouth rinse, polyethylene glycol, sodium chloride  flush   Vital Signs  Vitals:   03/16/24 0645 03/16/24 0700 03/16/24 0734 03/16/24 0736  BP:      Pulse: 81 79  81  Resp: (!) 21 (!) 22  20  Temp:      TempSrc:      SpO2: 97% 97% 99% 99%  Weight:      Height:        Intake/Output Summary (Last 24 hours) at 03/16/2024 0745 Last data filed at 03/16/2024 0700 Gross per 24 hour  Intake 2223.03 ml  Output 1785 ml  Net 438.03 ml      03/16/2024    5:00 AM 03/15/2024    4:00 AM 03/14/2024    4:53 AM  Last 3 Weights  Weight (lbs) 278 lb 286 lb 13.1 oz 297 lb 13.5 oz  Weight (kg) 126.1 kg 130.1 kg 135.1 kg      Telemetry 1 run of NSVT  ECG   - Personally  Reviewed; sinus rhythm with first-degree AV block and prolonged QT  Physical Exam  GEN: Intubated, sedated Neck: Unable to estimate JVD given habitus Cardiac: RRR, no murmurs, rubs, or gallops.  Respiratory: Clear to auscultation bilaterally. GI: Soft,  non-distended  MS: No edema; No deformity. Neuro:  Nonfocal  Psych: Normal affect   Labs High Sensitivity Troponin:  No results for input(s): TROPONINIHS in the last 720 hours.   Chemistry Recent Labs  Lab 03/12/24 0412 03/12/24 1605 03/14/24 0324 03/14/24 1611 03/15/24 0135 03/15/24 0509 03/15/24 1607 03/16/24 0414  NA 133*   < > 135   < > 134* 134* 130* 136  K 4.1   < > 4.1   < > 4.3 4.1 4.3 4.1  CL 101   < > 101   < > 101 100 98 103  CO2 25   < > 25   < > 24 25 25 23   GLUCOSE 106*   < > 145*   < > 145* 157* 255* 122*  BUN 12   < > 14   < > 13 13 16 22   CREATININE 1.52*   < > 1.32*   < > 1.29* 1.27* 1.53* 2.13*  CALCIUM  8.4*   < > 8.7*   < >  8.9 8.9 9.0 9.6  MG 2.3   < > 2.3  --  2.4  --   --  2.4  PROT 5.8*  --   --   --   --   --   --   --   ALBUMIN 3.1*  3.0*   < > 3.1*   < >  --  3.0* 2.9* 3.1*  AST 14*  --   --   --   --   --   --   --   ALT 12  --   --   --   --   --   --   --   ALKPHOS 80  --   --   --   --   --   --   --   BILITOT 0.5  --   --   --   --   --   --   --   GFRNONAA 47*   < > 55*   < > 57* 58* 46* 31*  ANIONGAP 7   < > 10   < > 10 10 8 11    < > = values in this interval not displayed.    Lipids  Recent Labs  Lab 03/16/24 0414  TRIG 155*    Hematology Recent Labs  Lab 03/14/24 0324 03/15/24 0135 03/16/24 0414  WBC 11.1* 12.2* 7.6  RBC 3.41* 3.50* 3.28*  HGB 11.0* 11.2* 10.6*  HCT 34.9* 35.7* 33.2*  MCV 102.3* 102.0* 101.2*  MCH 32.3 32.0 32.3  MCHC 31.5 31.4 31.9  RDW 14.4 14.6 14.1  PLT 84* 116* 106*   Thyroid  No results for input(s): TSH, FREET4 in the last 168 hours.   BNPNo results for input(s): BNP, PROBNP in the last 168 hours.  DDimer No results for  input(s): DDIMER in the last 168 hours.   Radiology  No results found.  Cardiac Studies -TTE 03/07/2024 LVEF 60 to 65% with severe LVH, grade 3 diastolic dysfunction, no significant valvular disease -CT chest 02/2024 with severe aortic atherosclerosis and severe three-vessel coronary artery calcifications - TTE 01/2024 LVEF 60 to 65%, severe LVH, grade 3 diastolic dysfunction  Assessment & Plan  Acute on chronic respiratory failure HFpEF Undifferentiated shock Unclear cause.  Does have significantly elevated BNP in setting of renal failure.  EF has been preserved on previous echocardiograms.  CVP 12-15 today and wide pulse pressure.  He has a persistent high pressor requirement.  No obvious infectious cause established or other clear etiology of his shock state.  Do think there is a component of HFpEF regarding his hypoxemic respiratory failure, but would not expect cardiogenic shock unless his EF has dropped or unless he has severe RV failure.  His previous echocardiogram shows severe LVH with grade 3 diastolic dysfunction and normal RV function.  He does not have obvious pulsus paradoxus on his arterial line.  I think we should recheck an echo to make sure that he has not had a precipitous drop in his biventricular function, effusion/tamponade, or valvular catastrophe.  Plan: - Continue empiric antibiotics - I will order a repeat limited echo to further characterize his shock state and evaluate for new ventricular dysfunction, pericardial effusion, or significant valvular disease.  I would expect a narrow pulse pressure if he had significant LV or RV dysfunction or something like tamponade. - Continue to trend lactic and co-ox - If he is still quite ill on Monday and his shock remains undifferentiated, I will speak  with the Cath Lab about obtaining a diagnostic RHC to further characterize this   Acute on chronic renal failure -Continue CRRT   Acute on chronic diastolic CHF Normal EF  with severe LVH, restrictive physiology - As above, he should have a cardiac amyloidosis/HCM and other restrictive cardiomyopathy evaluation once he has recovered, though renal failure limits treatment options.  This is obviously lower on the priority list currently. - No GDMT for now given profound shock   Elevated troponin Coronary artery calcifications Aortic atherosclerosis Up to 200, likely supply/demand mismatch in the setting of respiratory failure - No plans for ischemic evaluation given chronic renal failure.  He likely has underlying CAD given his severe three-vessel CAC, but options are limited given how sick he is   Paroxysmal atrial fibrillation AIVR NSVT Rapid atrial fibrillation 2 nights ago.  Amiodarone  and heparin  started.  Amiodarone  discontinued overnight due to episode of AIVR.  Has had some ongoing bloody secretions so heparin  also stopped.  Having intermittent NSVT, likely due to vasopressors/inotropes  Plan: - If he has recurrence of rapid atrial fibrillation or sustained VT, amiodarone  is likely our only option - Will need to continue holding heparin  for now given ongoing bleeding  Time Spent with Patient: I have spent a total of 39 minutes caring for this patient today face to face, ordering and reviewing labs/tests, reviewing prior records/medical history, examining the patient, establishing an assessment and plan, communicating results/findings to the patient/family, and documenting in the medical record.   For questions or updates, please contact [facility] Please consult [email or web address] for contact info under     Signed, Caron Poser, MD  03/16/2024, 7:45 AM

## 2024-03-16 NOTE — Consult Note (Signed)
 PHARMACY - ANTICOAGULATION CONSULT NOTE  Pharmacy Consult for heparin  infusion Indication: atrial fibrillation  No Known Allergies  Patient Measurements: Height: 5' 9 (175.3 cm) Weight: 126.1 kg (278 lb) IBW/kg (Calculated) : 70.7  Vital Signs: Temp: 98.6 F (37 C) (11/22 2000) Temp Source: Oral (11/22 2000) Pulse Rate: 100 (11/22 2000)  Labs: Recent Labs    03/14/24 0324 03/14/24 0906 03/15/24 0135 03/15/24 0509 03/15/24 1607 03/16/24 0414 03/16/24 2024  HGB 11.0*  --  11.2*  --   --  10.6*  --   HCT 34.9*  --  35.7*  --   --  33.2*  --   PLT 84*  --  116*  --   --  106*  --   APTT 152*  --  91*  --   --   --   --   HEPARINUNFRC  --    < > 0.34  --   --  <0.10* 0.24*  CREATININE 1.32*   < > 1.29* 1.27* 1.53* 2.13*  --    < > = values in this interval not displayed.    Estimated Creatinine Clearance: 37.6 mL/min (A) (by C-G formula based on SCr of 2.13 mg/dL (H)).   Medical History: Past Medical History:  Diagnosis Date   CHF (congestive heart failure) (HCC)    Degenerative joint disease    High cholesterol    Hypertension    Kidney stones    Obesity    Respiratory failure with hypoxia (HCC)    Sleep apnea     Medications:  Patient was on heparin  infusion that was held 11/21 due to bleeding.  Assessment: Patient is a 78 y/o male with a PMH significant for HFpEF, COPD, and acute on chronic hypoxic respiratory failure, currently with AKI requiring CRRT. Pharmacy is consulted for initiation and management of a heparin  infusion ISO atrial fibrillation   11/22 @ 2024: HL 0.24, subtherapeutic   Goal of Therapy:  Heparin  level 0.3-0.7 units/ml Monitor platelets by anticoagulation protocol: Yes   Plan:  No initial bolus given based on indication and recent bleeding Heparin  level subtherapeutic  Give 1000 unit bolus x 1 Increase heparin  infusion rate to 1600 units/hr Check heparin  level 8 hours after rate change Daily CBC while on heparin   Lum VEAR Mania, PharmD 03/16/2024,9:22 PM

## 2024-03-16 NOTE — Progress Notes (Addendum)
 [facility] [place], [place] 03/16/24  Subjective:   Hospital day # 12 78 year old male with hypertension, heart failure with preserved ejection fraction, hyperlipidemia, stage IV CKD, obstructive sleep apnea, MGUS, chronic respiratory failure on 2 L oxygen  presented from [facility] with shortness of breath.  Transferred to [facility] for need of continuous dialysis.  Patient critically ill Pressor support- dobutamine , norepinephrine  On Bipap, weaning to high flow A-line Foley in place Urine output 1500 Received IVF bolus overnight Plan for HD tomorrow   Objective:  Vital signs in last 24 hours:  Temp:  [97.5 F (36.4 C)-98.7 F (37.1 C)] 97.5 F (36.4 C) (11/22 0800) Pulse Rate:  [62-113] 62 (11/22 1220) Resp:  [13-29] 16 (11/22 1220) SpO2:  [92 %-100 %] 100 % (11/22 1220) Arterial Line BP: (75-163)/(34-57) 120/45 (11/22 1045) FiO2 (%):  [28 %-50 %] 30 % (11/22 1314) Weight:  [126.1 kg] 126.1 kg (11/22 0500)  Weight change: -4 kg Filed Weights   03/14/24 0453 03/15/24 0400 03/16/24 0500  Weight: 135.1 kg 130.1 kg 126.1 kg    Intake/Output:    Intake/Output Summary (Last 24 hours) at 03/16/2024 1438 Last data filed at 03/16/2024 1414 Gross per 24 hour  Intake 1333.38 ml  Output 1515 ml  Net -181.62 ml     Physical Exam: General: Critically ill-appearing  HEENT ET tube, OG tube in place  Pulm/lungs Ventilator assisted  CVS/Heart Tachycardic  Abdomen:  Nondistended  Extremities: Dependent edema is present  Neurologic:  sedated  Skin: Warm, dry  Access: Left femoral dialysis catheter       Basic Metabolic Panel:  Recent Labs  Lab 03/12/24 0412 03/12/24 1605 03/13/24 0346 03/13/24 1650 03/14/24 0324 03/14/24 1611 03/15/24 0135 03/15/24 0509 03/15/24 1607 03/16/24 0414  NA 133*   < > 135   < > 135 130* 134* 134* 130* 136  K 4.1   < > 4.0   < > 4.1 4.3 4.3 4.1 4.3 4.1  CL 101   < > 100   < > 101 95* 101 100 98 103  CO2 25    < > 26   < > 25 24 24 25 25 23   GLUCOSE 106*   < > 147*   < > 145* 220* 145* 157* 255* 122*  BUN 12   < > 14   < > 14 13 13 13 16 22   CREATININE 1.52*   < > 1.27*   < > 1.32* 1.35* 1.29* 1.27* 1.53* 2.13*  CALCIUM  8.4*   < > 8.8*   < > 8.7* 8.2* 8.9 8.9 9.0 9.6  MG 2.3  --  2.3  --  2.3  --  2.4  --   --  2.4  PHOS 2.5   < > 2.7   < > 2.9 2.6  --  2.3* 2.3* 2.7   < > = values in this interval not displayed.     CBC: Recent Labs  Lab 03/12/24 0412 03/13/24 0346 03/14/24 0324 03/15/24 0135 03/16/24 0414  WBC 14.7*  15.0* 9.8 11.1* 12.2* 7.6  NEUTROABS 13.2*  --   --   --   --   HGB 11.2*  11.2* 11.0* 11.0* 11.2* 10.6*  HCT 35.3*  35.3* 34.9* 34.9* 35.7* 33.2*  MCV 102.0*  101.7* 102.3* 102.3* 102.0* 101.2*  PLT 76*  75* 62* 84* 116* 106*     No results found for: HEPBSAG, HEPBSAB, HEPBIGM    Microbiology:  Recent Results (from the past  240 hours)  Blood culture (routine x 2)     Status: None   Collection Time: 03/06/24  9:29 PM   Specimen: BLOOD  Result Value Ref Range Status   Specimen Description BLOOD RIGHT ANTECUBITAL  Final   Special Requests   Final    BOTTLES DRAWN AEROBIC AND ANAEROBIC Blood Culture adequate volume   Culture   Final    NO GROWTH 5 DAYS Performed at [facility], [address], [place], [place] [address]    Report Status 03/11/2024 FINAL  Final  Resp panel by RT-PCR (RSV, Flu A&B, Covid) Anterior Nasal Swab     Status: None   Collection Time: 03/06/24  9:33 PM   Specimen: Anterior Nasal Swab  Result Value Ref Range Status   SARS Coronavirus 2 by RT PCR NEGATIVE NEGATIVE Final    Comment: (NOTE) SARS-CoV-2 target nucleic acids are NOT DETECTED.  The SARS-CoV-2 RNA is generally detectable in upper respiratory specimens during the acute phase of infection. The lowest concentration of SARS-CoV-2 viral copies this assay can detect is 138 copies/mL. A negative result does not preclude SARS-Cov-2 infection and should not be used as  the sole basis for treatment or other patient management decisions. A negative result may occur with  improper specimen collection/handling, submission of specimen other than nasopharyngeal swab, presence of viral mutation(s) within the areas targeted by this assay, and inadequate number of viral copies(<138 copies/mL). A negative result must be combined with clinical observations, patient history, and epidemiological information. The expected result is Negative.  Fact Sheet for Patients:  bloggercourse.com  Fact Sheet for Healthcare Providers:  seriousbroker.it  This test is no t yet approved or cleared by the [place]  FDA and  has been authorized for detection and/or diagnosis of SARS-CoV-2 by FDA under an Emergency Use Authorization (EUA). This EUA will remain  in effect (meaning this test can be used) for the duration of the COVID-19 declaration under Section 564(b)(1) of the Act, 21 U.S.C.section 360bbb-3(b)(1), unless the authorization is terminated  or revoked sooner.       Influenza A by PCR NEGATIVE NEGATIVE Final   Influenza B by PCR NEGATIVE NEGATIVE Final    Comment: (NOTE) The Xpert Xpress SARS-CoV-2/FLU/RSV plus assay is intended as an aid in the diagnosis of influenza from Nasopharyngeal swab specimens and should not be used as a sole basis for treatment. Nasal washings and aspirates are unacceptable for Xpert Xpress SARS-CoV-2/FLU/RSV testing.  Fact Sheet for Patients: bloggercourse.com  Fact Sheet for Healthcare Providers: seriousbroker.it  This test is not yet approved or cleared by the [place]  FDA and has been authorized for detection and/or diagnosis of SARS-CoV-2 by FDA under an Emergency Use Authorization (EUA). This EUA will remain in effect (meaning this test can be used) for the duration of the COVID-19 declaration under Section 564(b)(1) of  the Act, 21 U.S.C. section 360bbb-3(b)(1), unless the authorization is terminated or revoked.     Resp Syncytial Virus by PCR NEGATIVE NEGATIVE Final    Comment: (NOTE) Fact Sheet for Patients: bloggercourse.com  Fact Sheet for Healthcare Providers: seriousbroker.it  This test is not yet approved or cleared by the [place]  FDA and has been authorized for detection and/or diagnosis of SARS-CoV-2 by FDA under an Emergency Use Authorization (EUA). This EUA will remain in effect (meaning this test can be used) for the duration of the COVID-19 declaration under Section 564(b)(1) of the Act, 21 U.S.C. section 360bbb-3(b)(1), unless the authorization is terminated or revoked.  Performed at [facility], [address] [place], [place] [address]   Blood culture (routine x 2)     Status: None   Collection Time: 03/06/24 10:55 PM   Specimen: BLOOD  Result Value Ref Range Status   Specimen Description BLOOD BLOOD RIGHT HAND  Final   Special Requests   Final    BOTTLES DRAWN AEROBIC AND ANAEROBIC Blood Culture adequate volume   Culture   Final    NO GROWTH 5 DAYS Performed at [facility] [address], [place], [place] [address]    Report Status 03/11/2024 FINAL  Final  MRSA Next Gen by PCR, Nasal     Status: None   Collection Time: 03/07/24  1:43 AM   Specimen: Nasal Mucosa; Nasal Swab  Result Value Ref Range Status   MRSA by PCR Next Gen NOT DETECTED NOT DETECTED Final    Comment: (NOTE) The GeneXpert MRSA Assay (FDA approved for NASAL specimens only), is one component of a comprehensive MRSA colonization surveillance program. It is not intended to diagnose MRSA infection nor to guide or monitor treatment for MRSA infections. Test performance is not FDA approved in patients less than 85 years old. Performed at [facility], [address] [place], [place] [address]   Respiratory (~20 pathogens) panel by PCR     Status: None    Collection Time: 03/07/24 11:18 AM   Specimen: Nasopharyngeal Swab; Respiratory  Result Value Ref Range Status   Adenovirus NOT DETECTED NOT DETECTED Final   Coronavirus 229E NOT DETECTED NOT DETECTED Final    Comment: (NOTE) The Coronavirus on the Respiratory Panel, DOES NOT test for the novel  Coronavirus (2019 nCoV)    Coronavirus HKU1 NOT DETECTED NOT DETECTED Final   Coronavirus NL63 NOT DETECTED NOT DETECTED Final   Coronavirus OC43 NOT DETECTED NOT DETECTED Final   Metapneumovirus NOT DETECTED NOT DETECTED Final   Rhinovirus / Enterovirus NOT DETECTED NOT DETECTED Final   Influenza A NOT DETECTED NOT DETECTED Final   Influenza B NOT DETECTED NOT DETECTED Final   Parainfluenza Virus 1 NOT DETECTED NOT DETECTED Final   Parainfluenza Virus 2 NOT DETECTED NOT DETECTED Final   Parainfluenza Virus 3 NOT DETECTED NOT DETECTED Final   Parainfluenza Virus 4 NOT DETECTED NOT DETECTED Final   Respiratory Syncytial Virus NOT DETECTED NOT DETECTED Final   Bordetella pertussis NOT DETECTED NOT DETECTED Final   Bordetella Parapertussis NOT DETECTED NOT DETECTED Final   Chlamydophila pneumoniae NOT DETECTED NOT DETECTED Final   Mycoplasma pneumoniae NOT DETECTED NOT DETECTED Final    Comment: Performed at [facility], [address], [place], [place] [address]  MRSA Next Gen by PCR, Nasal     Status: None   Collection Time: 03/08/24  7:23 PM   Specimen: Nasal Mucosa; Nasal Swab  Result Value Ref Range Status   MRSA by PCR Next Gen NOT DETECTED NOT DETECTED Final    Comment: (NOTE) The GeneXpert MRSA Assay (FDA approved for NASAL specimens only), is one component of a comprehensive MRSA colonization surveillance program. It is not intended to diagnose MRSA infection nor to guide or monitor treatment for MRSA infections. Test performance is not FDA approved in patients less than 5 years old. Performed at [facility], [address] [place], [place] [address]    Culture, Respiratory w Gram Stain     Status: None   Collection Time: 03/12/24  8:21 AM   Specimen: Tracheal Aspirate; Respiratory  Result Value Ref Range  Status   Specimen Description   Final    TRACHEAL ASPIRATE Performed at [facility], [address] [place], [place] [address]    Special Requests   Final    NONE Performed at [facility], [address] [place], [place] [address]    Gram Stain   Final    RARE WBC PRESENT,BOTH PMN AND MONONUCLEAR FEW GRAM POSITIVE RODS    Culture   Final    MODERATE METHICILLIN RESISTANT STAPHYLOCOCCUS AUREUS WITHIN MIXED FLORA Performed at [facility], [address] [place], [place] [address]    Report Status 03/15/2024 FINAL  Final   Organism ID, Bacteria METHICILLIN RESISTANT STAPHYLOCOCCUS AUREUS  Final      Susceptibility   Methicillin resistant staphylococcus aureus - MIC*    CIPROFLOXACIN >=8 RESISTANT Resistant     ERYTHROMYCIN >=8 RESISTANT Resistant     GENTAMICIN <=0.5 SENSITIVE Sensitive     OXACILLIN >=4 RESISTANT Resistant     TETRACYCLINE <=1 SENSITIVE Sensitive     VANCOMYCIN  1 SENSITIVE Sensitive     TRIMETH/SULFA >=320 RESISTANT Resistant     CLINDAMYCIN <=0.25 SENSITIVE Sensitive     RIFAMPIN <=0.5 SENSITIVE Sensitive     Inducible Clindamycin NEGATIVE Sensitive     LINEZOLID  2 SENSITIVE Sensitive     * MODERATE METHICILLIN RESISTANT STAPHYLOCOCCUS AUREUS  MRSA Next Gen by PCR, Nasal     Status: Abnormal   Collection Time: 03/13/24 11:57 AM   Specimen: Nasal Mucosa; Nasal Swab  Result Value Ref Range Status   MRSA by PCR Next Gen DETECTED (A) NOT DETECTED Final    Comment: RESULT CALLED TO, READ BACK BY AND VERIFIED WITH: JORGE MORALES AT 1326 ON 03/13/24 BY SS (NOTE) The GeneXpert MRSA Assay (FDA approved for NASAL specimens only), is one component of a comprehensive MRSA colonization surveillance program. It is not intended to diagnose MRSA infection nor to guide or monitor  treatment for MRSA infections. Test performance is not FDA approved in patients less than 55 years old. Performed at [facility], [address] [place], [place] [address]     Coagulation Studies: No results for input(s): LABPROT, INR in the last 72 hours.   Urinalysis: No results for input(s): COLORURINE, LABSPEC, PHURINE, GLUCOSEU, HGBUR, BILIRUBINUR, KETONESUR, PROTEINUR, UROBILINOGEN, NITRITE, LEUKOCYTESUR in the last 72 hours.  Invalid input(s): APPERANCEUR    Imaging: ECHOCARDIOGRAM LIMITED Result Date: 03/16/2024    ECHOCARDIOGRAM LIMITED REPORT   Patient Name:   Dave Brown Date of Exam: 03/16/2024 Medical Rec #:  [id number]   Height:       69.0 in Accession #:    [id number]  Weight:       278.0 lb Date of Birth:  02-12-1946    BSA:          2.376 m Patient Age:    78 years    BP:           126/46 mmHg Patient Gender: M           HR:           82 bpm. Exam Location:  [facility] Procedure: Limited Echo and Limited Color Doppler (Both Spectral and Color Flow            Doppler were utilized during procedure). Indications:     Shock R57.9  History:         Patient has prior history of Echocardiogram examinations, most  recent 03/07/2024.  Sonographer:     Thedora Louder RDCS, FASE Referring Phys:  [id number] TRAVIS SKIPINA Diagnosing Phys: Caron Poser  Sonographer Comments: This study was perform with patient positioned supine and on BiPaP. IMPRESSIONS  1. Left ventricular ejection fraction, by estimation, is >75%. The left ventricle has hyperdynamic function. Left ventricular endocardial border not optimally defined to evaluate regional wall motion. There is severe concentric left ventricular hypertrophy. Left ventricular diastolic parameters are consistent with Grade I diastolic dysfunction (impaired relaxation).  2. Right ventricular systolic function is normal. The right ventricular size is normal.  3. There is no evidence of pericardial  effusion.  4. The mitral valve is degenerative. No evidence of mitral valve regurgitation. No evidence of mitral stenosis.  5. The aortic valve has an indeterminant number of cusps. There is moderate calcification of the aortic valve. Aortic valve regurgitation is not visualized. Aortic valve sclerosis/calcification is present, without any evidence of aortic stenosis.  6. Aortic dilatation noted. There is mild dilatation of the aortic root, measuring 42 mm.  7. The inferior vena cava is dilated in size with <50% respiratory variability, suggesting right atrial pressure of 15 mmHg. Comparison(s): A prior study was performed on 03/07/2024. No significant change from prior study. FINDINGS  Left Ventricle: Left ventricular ejection fraction, by estimation, is >75%. The left ventricle has hyperdynamic function. Left ventricular endocardial border not optimally defined to evaluate regional wall motion. There is severe concentric left ventricular hypertrophy. Left ventricular diastolic parameters are consistent with Grade I diastolic dysfunction (impaired relaxation). Right Ventricle: The right ventricular size is normal. No increase in right ventricular wall thickness. Right ventricular systolic function is normal. Left Atrium: Left atrial size was not assessed. Right Atrium: Right atrial size was not assessed. Pericardium: There is no evidence of pericardial effusion. Mitral Valve: The mitral valve is degenerative in appearance. There is mild calcification of the mitral valve leaflet(s). Mild mitral annular calcification. No evidence of mitral valve stenosis. Tricuspid Valve: The tricuspid valve is not well visualized. Tricuspid valve regurgitation is not demonstrated. No evidence of tricuspid stenosis. Aortic Valve: The aortic valve has an indeterminant number of cusps. There is moderate calcification of the aortic valve. Aortic valve regurgitation is not visualized. Aortic valve sclerosis/calcification is present,  without any evidence of aortic stenosis. Aortic valve mean gradient measures 8.0 mmHg. Aortic valve peak gradient measures 13.7 mmHg. Pulmonic Valve: The pulmonic valve was not assessed. Aorta: Aortic dilatation noted. There is mild dilatation of the aortic root, measuring 42 mm. Venous: The inferior vena cava is dilated in size with less than 50% respiratory variability, suggesting right atrial pressure of 15 mmHg. Additional Comments: Spectral Doppler performed. Color Doppler performed.  LEFT VENTRICLE PLAX 2D LVIDd:         4.30 cm   Diastology LVIDs:         3.00 cm   LV e' medial:    5.22 cm/s LV PW:         1.50 cm   LV E/e' medial:  16.2 LV IVS:        1.50 cm   LV e' lateral:   6.96 cm/s LVOT diam:     2.00 cm   LV E/e' lateral: 12.1 LVOT Area:     3.14 cm LV IVRT:       121 msec  RIGHT VENTRICLE RV S prime:     13.80 cm/s TAPSE (M-mode): 1.9 cm LEFT ATRIUM         Index LA  diam:    4.40 cm 1.85 cm/m  AORTIC VALVE AV Vmax:      185.00 cm/s AV Vmean:     129.000 cm/s AV VTI:       0.296 m AV Peak Grad: 13.7 mmHg AV Mean Grad: 8.0 mmHg  AORTA Ao Root diam: 4.20 cm MITRAL VALVE MV Area (PHT): 3.53 cm    SHUNTS MV Decel Time: 215 msec    Systemic Diam: 2.00 cm MV E velocity: 84.40 cm/s MV A velocity: 87.00 cm/s MV E/A ratio:  0.97 [place] Electronically signed by Caron Poser Signature Date/Time: 03/16/2024/9:29:35 AM    Final       Medications:    dexmedetomidine  (PRECEDEX ) IV infusion 1 mcg/kg/hr (03/16/24 1050)   DOBUTamine  Stopped (03/16/24 1117)   heparin  1,500 Units/hr (03/16/24 1245)   norepinephrine  (LEVOPHED ) Adult infusion 18 mcg/min (03/16/24 0700)   piperacillin -tazobactam (ZOSYN )  IV 3.375 g (03/16/24 1418)    Chlorhexidine  Gluconate Cloth  6 each Topical Daily   insulin  aspart  0-6 Units Subcutaneous Q4H   ipratropium-albuterol   3 mL Nebulization TID   multivitamin  1 tablet Per Tube QHS   mupirocin  ointment  1 Application Nasal BID   nystatin   5 mL Oral QID   mouth  rinse  15 mL Mouth Rinse 4 times per day   pantoprazole  (PROTONIX ) IV  40 mg Intravenous QHS   sertraline   25 mg Per Tube Daily   thiamine  (VITAMIN B1) injection  100 mg Intravenous Daily   vancomycin  variable dose per unstable renal function (pharmacist dosing)   Does not apply See admin instructions   docusate sodium , fentaNYL  (SUBLIMAZE ) injection, ipratropium-albuterol , mouth rinse, mouth rinse, polyethylene glycol, sodium chloride  flush  Assessment/ Plan:  78 y.o. 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  Baseline creatinine of 2.5-2.8. Acute kidney injury is thought to be secondary to pneumonia and circulatory shock causing ATN.  Patient responded well to IV Lasix  03/13/24.  However due to low CVP, further doses were held. Ultrafiltration goal was reduced to -50 cc/h then net 0  Plan -Hemodialysis planned for tomorrow - Pressor support per primary team.  Electrolytes and volume status are maintained.    Acute respiratory failure Currently requiring ventilator support.  FiO2 30%/PEEP 5 Respiratory pathogen's panel by PCR is negative.  Blood cultures from 03/06/2024 are negative.    LOS: 8 Dave Brown 11/22/20252:38 PM  [facility] [address] [place], [place] [phone]  Patient was seen and examined with Dave Dines, NP.  Plan of care was formulated for the problems addressed and discussed with NP.  I agree with the note as documented except as noted below.   CRRT was stopped yesterday. Patient is requiring BiPAP today Monitor UOP closely; 1185 cc recorded from yesteday Assess daily for  need of additional dialysis

## 2024-03-16 NOTE — Consult Note (Signed)
 PHARMACY CONSULT NOTE - ELECTROLYTES  Pharmacy Consult for Electrolyte Monitoring and Replacement   Recent Labs: Height: 5' 9 (175.3 cm) Weight: 126.1 kg (278 lb) IBW/kg (Calculated) : 70.7 Estimated Creatinine Clearance: 37.6 mL/min (A) (by C-G formula based on SCr of 2.13 mg/dL (H)). Potassium  Date Value  03/16/2024 4.1 mmol/L  07/18/2013 3.4 mEq/L (L)   Magnesium (mg/dL)  Date Value  88/77/7974 2.4   Calcium  (mg/dL)  Date Value  88/77/7974 9.6  07/18/2013 10.1   Albumin (g/dL)  Date Value  88/77/7974 3.1 (L)  07/18/2013 3.8   Phosphorus (mg/dL)  Date Value  88/77/7974 2.7   Sodium  Date Value  03/16/2024 136 mmol/L  07/18/2013 138 mEq/L   Assessment  Dave Brown is a 78 y.o. male presenting with AKI requiring CRRT. PMH significant for HFpEF and COPD requiring 2L supplemental oxygen  . Pharmacy has been consulted to monitor and replace electrolytes.  Goal of Therapy: Electrolytes WNL  Plan:  No electrolyte replacement warranted for today Check BMP and phosphorous tomorrow AM  Thank you for allowing pharmacy to be a part of this patient's care.  Marolyn KATHEE Mare 03/16/2024 6:53 AM

## 2024-03-16 NOTE — Consult Note (Signed)
 PHARMACY - ANTICOAGULATION CONSULT NOTE  Pharmacy Consult for heparin  infusion Indication: atrial fibrillation  No Known Allergies  Patient Measurements: Height: 5' 9 (175.3 cm) Weight: 126.1 kg (278 lb) IBW/kg (Calculated) : 70.7  Vital Signs: Temp: 97.5 F (36.4 C) (11/22 0800) Temp Source: Axillary (11/22 0800) Pulse Rate: 76 (11/22 1045)  Labs: Recent Labs    03/14/24 0324 03/14/24 0906 03/14/24 1611 03/15/24 0135 03/15/24 0509 03/15/24 1607 03/16/24 0414  HGB 11.0*  --   --  11.2*  --   --  10.6*  HCT 34.9*  --   --  35.7*  --   --  33.2*  PLT 84*  --   --  116*  --   --  106*  APTT 152*  --   --  91*  --   --   --   HEPARINUNFRC  --  0.34  --  0.34  --   --  <0.10*  CREATININE 1.32*  --    < > 1.29* 1.27* 1.53* 2.13*   < > = values in this interval not displayed.    Estimated Creatinine Clearance: 37.6 mL/min (A) (by C-G formula based on SCr of 2.13 mg/dL (H)).   Medical History: Past Medical History:  Diagnosis Date   CHF (congestive heart failure) (HCC)    Degenerative joint disease    High cholesterol    Hypertension    Kidney stones    Obesity    Respiratory failure with hypoxia (HCC)    Sleep apnea     Medications:  Patient was on heparin  infusion that was held 11/21 due to bleeding.  Assessment: Patient is a 78 y/o male with a PMH significant for HFpEF, COPD, and acute on chronic hypoxic respiratory failure, currently with AKI requiring CRRT. Pharmacy is consulted for initiation and management of a heparin  infusion ISO atrial fibrillation   Goal of Therapy:  Heparin  level 0.3-0.7 units/ml Monitor platelets by anticoagulation protocol: Yes   Plan:  Will not give initial bolus based on indication and recent bleeding Will resume heparin  infusion at previous rate of 1500 units/hr Check heparin  level 8 hours after drip resumed Daily CBC while on heparin   Kayla JULIANNA Blew, PharmD 03/16/2024,12:05 PM

## 2024-03-16 NOTE — Progress Notes (Addendum)
 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 He denied any fevers, chills, chest pain.     Of note, the patient was recently admitted to the hospital from 02/01/2024 to 02/05/2024 for respiratory failure due to CHF.  The patient did not require oxygen  at the time of his discharge.  His discharge weight was 286.  At his last office visit with his primary provider his weight was 291 pounds on 02/22/24.  Follow up with pulmonary on 03/04/24 office weight 29.   He was discharged home with furosemide  40 mg daily during last hospitalization   ED Course: Initial Vital Signs: afebrile and hemodynamically stable. Oxygen  saturation was 93-97% on 4 L  Significant Labs: WBC 11.0, hemoglobin 12.3, platelets 138. Sodium 141, potassium 4.1, bicarbonate 31, serum creatinine 4.15. proBNP 16,139. PCT 0.13. Lactic acid 1.9 >> 0.8. COVID-negative  Imaging Chest X-ray>>patchy opacities right lower lobe and left lower lobe  Medications Administered: furosemide  40 mg IV, Solu-Medrol  125 mg, ceftriaxone , and azithromycin . He was given albuterol  and Atrovent .    [facility] asked to admit for further workup and treatment.  He gradually became hypotensive requiring initiation of low dose Levophed  (3 to 5 mcg), PCCM was consulted.  Nephrology was consulted for oliguria. Case was discussed with nephrology and it was felt the patient need to be initiated on CRRT. Subsequently, initial request was made to transfer the patient to [facility]. In order to expedite care, he is being transferred to [facility] regional ICU to critical care service where  nephrology will be consulted for continued management of his renal failure and fluid overload.   Pertinent  Medical History  As above.   Significant Hospital Events: Including procedures, antibiotic start and stop dates in addition to other pertinent events   11/12: Admitted to [facility] to [facility] for treatment of Acute Decompensated HFpEF and AKI. 11/13: BP decreased, started on Levophed .  PCCM consulted.  ABX discontinued. 11/14: Transfer to [facility] for initiation of CRRT.  11/15: seizure like episode, intubated 11/16: Unable to wean; PICC line placed 11/21: Extubated to Bipap. Added Dobutamine .   Interim History / Subjective:  -- Patient remains on bipap this AM.  -- WBC stable at 7.6. H&H Stable. Coox SvO2 81%.  -- Electrolytes within normal range.  -- Remains on NE at 18 and Dobutamine  at 5. CVP 8. Echocardiogram with collapsible IVC.   Objective    Blood pressure (!) 116/56, pulse 81, temperature 97.8 F (36.6 C), temperature source Axillary, resp. rate 20, height 5' 9 (1.753 m), weight 126.1 kg, SpO2 99%. CVP:  [5 mmHg-79 mmHg] 7 mmHg  FiO2 (%):  [28 %-40 %] 28 %   Intake/Output Summary (Last 24 hours) at 03/16/2024 0900 Last data filed at 03/16/2024 0700 Gross per 24 hour  Intake 1459.46 ml  Output 1410 ml  Net 49.46 ml   Filed Weights   03/14/24 0453 03/15/24 0400 03/16/24 0500  Weight: 135.1 kg 130.1 kg 126.1 kg    Examination: General: On bipap comfortable.  HENT: Supple neck, reactive pupils, EOMI  Lungs: Coarse breath sounds bilaterally  Cardiovascular: Normal S1, Normal  S2, RRR Abdomen: Soft, non tender, non distended, +BS  Extremities: Warm, No edema  Labs and imaging were reviewed.   Assessment and Plan  #Acute Hypoxic Respiratory Failure requiring intubation and MV 11/15 #Acute on Chronic HFpEF #Acute Renal Failure on CKD #Cardiogenic Shock #T2DM #Toxic Metabolic Encephalopathy #Paroxysmal A.fib    Neuro - Encephalopathic secondary to  hypercarbia, without improvement following BiPAP. Required dexmedetomidine  for sedation to tolerate care (CRRT, BIPAP, etc...). Patient developed as seizure like episode with further encephalopathy and respiratory distress. EEG Normal. CT head unremarkable. Possibole due  to hypercapnic resp failure. Extubated on 11/22. Remains encephalopathic and on low dose Dex.  CV - Decompensated HFpEF with anasarca and concomitant renal failure. TTE with severe LVH and diastolic dysfunction, suspect also component of pulmonary hypertension from untreated OSA/OHS.  Appreciate input from cardiology. Continue to titrate vasopressors for goal MAP > 60 mmHg. Wide pulse pressure. I think this is a mixed distributive cardiogenic shock. He appears to be volume down based on low CVP and Collapsible IVC. Optimally he should have a Swan but unfortunately we do not have the capabilities to do that here. I will plan to give him fluids and assess response. IF more stable by Monday can consider a RHC to assess filling pressures Pulm - Intubated due to worsening encephalopathy as well as worsening respiratory failure with hypoxia and hypercapnia. CRRT for volume mobilization, and now vented, trending blood gas to ensure ventilation and oxygenation. Extubated 11/21.  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 Started on CRRT. Total negative -9L. Running Even as increased NE requirements. Off CRRT since yesterday. Will coordinate with renal for iHD if needed.  Endo - ICU glycemic protocol. Hem/Onc - Hold heparin  drip for now given bloody secretions. DVT prophylaxis. Will need to be on systemic ac if no signs of bleeding.  ID -  WBC uptrending and SvO2 82%. Trach aspirate with staph aureus. MRSA Swab +.  Blood cultures pending negative so far.  Started on Zosyn  11/18 will add vancomycin  given MRSA PCR +. To complete a 10 days course.    Critical care time: 40 minutes      Darrin Barn, MD [facility] 03/16/2024 9:00 AM

## 2024-03-16 NOTE — Progress Notes (Signed)
 Patient is moving frequently, needs reorienting and emotional support to calm, despite receiving the maximum dose of precedex . When he is moving, his arterial line shows his blood pressure dropping, sometimes very low. Once he stops moving, it will return to an acceptable range.

## 2024-03-17 DIAGNOSIS — J15212 Pneumonia due to Methicillin resistant Staphylococcus aureus: Secondary | ICD-10-CM

## 2024-03-17 LAB — COOXEMETRY PANEL
Carboxyhemoglobin: 1.4 % (ref 0.5–1.5)
Carboxyhemoglobin: 1.6 % — ABNORMAL HIGH (ref 0.5–1.5)
Carboxyhemoglobin: 1.8 % — ABNORMAL HIGH (ref 0.5–1.5)
Methemoglobin: 0.8 % (ref 0.0–1.5)
Methemoglobin: <0.7 % (ref 0.0–1.5)
Methemoglobin: <0.7 % (ref 0.0–1.5)
O2 Saturation: 60 %
O2 Saturation: 66.9 %
O2 Saturation: 71.6 %
Total hemoglobin: 10 g/dL — ABNORMAL LOW (ref 12.0–16.0)
Total hemoglobin: 10.7 g/dL — ABNORMAL LOW (ref 12.0–16.0)
Total hemoglobin: 14.1 g/dL (ref 12.0–16.0)
Total oxygen content: 58.5 %
Total oxygen content: 65.8 %
Total oxygen content: 70.2 %

## 2024-03-17 LAB — RENAL FUNCTION PANEL
Albumin: 2.9 g/dL — ABNORMAL LOW (ref 3.5–5.0)
Albumin: 3 g/dL — ABNORMAL LOW (ref 3.5–5.0)
Anion gap: 11 (ref 5–15)
Anion gap: 9 (ref 5–15)
BUN: 29 mg/dL — ABNORMAL HIGH (ref 8–23)
BUN: 23 mg/dL (ref 8–23)
CO2: 23 mmol/L (ref 22–32)
CO2: 24 mmol/L (ref 22–32)
Calcium: 9.6 mg/dL (ref 8.9–10.3)
Calcium: 9.4 mg/dL (ref 8.9–10.3)
Chloride: 103 mmol/L (ref 98–111)
Chloride: 102 mmol/L (ref 98–111)
Creatinine, Ser: 2.63 mg/dL — ABNORMAL HIGH (ref 0.61–1.24)
Creatinine, Ser: 2.21 mg/dL — ABNORMAL HIGH (ref 0.61–1.24)
GFR, Estimated: 24 mL/min — ABNORMAL LOW (ref >60)
GFR, Estimated: 30 mL/min — ABNORMAL LOW (ref >60)
Glucose, Bld: 117 mg/dL — ABNORMAL HIGH (ref 70–99)
Glucose, Bld: 126 mg/dL — ABNORMAL HIGH (ref 70–99)
Phosphorus: 3.8 mg/dL (ref 2.5–4.6)
Phosphorus: 3 mg/dL (ref 2.5–4.6)
Potassium: 3.9 mmol/L (ref 3.5–5.1)
Potassium: 3.8 mmol/L (ref 3.5–5.1)
Sodium: 137 mmol/L (ref 135–145)
Sodium: 135 mmol/L (ref 135–145)

## 2024-03-17 LAB — CBC
HCT: 30.6 % — ABNORMAL LOW (ref 39.0–52.0)
HCT: 32.4 % — ABNORMAL LOW (ref 39.0–52.0)
Hemoglobin: 10 g/dL — ABNORMAL LOW (ref 13.0–17.0)
Hemoglobin: 10.3 g/dL — ABNORMAL LOW (ref 13.0–17.0)
MCH: 32.5 pg (ref 26.0–34.0)
MCH: 31.8 pg (ref 26.0–34.0)
MCHC: 32.7 g/dL (ref 30.0–36.0)
MCHC: 31.8 g/dL (ref 30.0–36.0)
MCV: 99.4 fL (ref 80.0–100.0)
MCV: 100 fL (ref 80.0–100.0)
Platelets: 106 K/uL — ABNORMAL LOW (ref 150–400)
Platelets: 115 K/uL — ABNORMAL LOW (ref 150–400)
RBC: 3.08 MIL/uL — ABNORMAL LOW (ref 4.22–5.81)
RBC: 3.24 MIL/uL — ABNORMAL LOW (ref 4.22–5.81)
RDW: 14.2 % (ref 11.5–15.5)
RDW: 14 % (ref 11.5–15.5)
WBC: 8.7 K/uL (ref 4.0–10.5)
WBC: 7.9 K/uL (ref 4.0–10.5)
nRBC: 0 % (ref 0.0–0.2)
nRBC: 0 % (ref 0.0–0.2)

## 2024-03-17 LAB — GLUCOSE, CAPILLARY
Glucose-Capillary: 108 mg/dL — ABNORMAL HIGH (ref 70–99)
Glucose-Capillary: 114 mg/dL — ABNORMAL HIGH (ref 70–99)
Glucose-Capillary: 187 mg/dL — ABNORMAL HIGH (ref 70–99)
Glucose-Capillary: 85 mg/dL (ref 70–99)
Glucose-Capillary: 91 mg/dL (ref 70–99)
Glucose-Capillary: 95 mg/dL (ref 70–99)
Glucose-Capillary: 96 mg/dL (ref 70–99)
Glucose-Capillary: 99 mg/dL (ref 70–99)

## 2024-03-17 LAB — HEPARIN LEVEL (UNFRACTIONATED)
Heparin Unfractionated: 0.38 [IU]/mL (ref 0.30–0.70)
Heparin Unfractionated: 0.44 [IU]/mL (ref 0.30–0.70)

## 2024-03-17 LAB — VANCOMYCIN, RANDOM: Vancomycin Rm: 20 ug/mL

## 2024-03-17 MED ORDER — VANCOMYCIN HCL IN DEXTROSE 1-5 GM/200ML-% IV SOLN
1000.0000 mg | Freq: Once | INTRAVENOUS | Status: AC
  Administered 2024-03-17: 1000 mg via INTRAVENOUS
  Filled 2024-03-17: qty 200

## 2024-03-17 MED ORDER — LACTATED RINGERS IV BOLUS
500.0000 mL | Freq: Once | INTRAVENOUS | Status: AC
  Administered 2024-03-17: 500 mL via INTRAVENOUS

## 2024-03-17 NOTE — Consult Note (Signed)
 PHARMACY - ANTICOAGULATION CONSULT NOTE  Pharmacy Consult for heparin  infusion Indication: atrial fibrillation  No Known Allergies  Patient Measurements: Height: 5' 9.02 (175.3 cm) Weight: 127.8 kg (281 lb 12 oz) IBW/kg (Calculated) : 70.74 HEPARIN  DW (KG): 99.7  Vital Signs: Temp: 98 F (36.7 C) (11/23 0400) Temp Source: Axillary (11/23 0400) Pulse Rate: 78 (11/23 0630)  Labs: Recent Labs    03/15/24 0135 03/15/24 0509 03/15/24 1607 03/16/24 0414 03/16/24 2024 03/17/24 0503 03/17/24 0611  HGB 11.2*  --   --  10.6*  --  10.0*  --   HCT 35.7*  --   --  33.2*  --  30.6*  --   PLT 116*  --   --  106*  --  106*  --   APTT 91*  --   --   --   --   --   --   HEPARINUNFRC 0.34  --   --  <0.10* 0.24*  --  0.38  CREATININE 1.29*   < > 1.53* 2.13*  --  2.63*  --    < > = values in this interval not displayed.    Estimated Creatinine Clearance: 30.6 mL/min (A) (by C-G formula based on SCr of 2.63 mg/dL (H)).   Medical History: Past Medical History:  Diagnosis Date   CHF (congestive heart failure) (HCC)    Degenerative joint disease    High cholesterol    Hypertension    Kidney stones    Obesity    Respiratory failure with hypoxia (HCC)    Sleep apnea     Medications:  Patient was on heparin  infusion that was held 11/21 due to bleeding.  Assessment: Patient is a 78 y/o male with a PMH significant for HFpEF, COPD, and acute on chronic hypoxic respiratory failure, currently with AKI requiring CRRT. Pharmacy is consulted for initiation and management of a heparin  infusion ISO atrial fibrillation   11/22 @ 2024: HL 0.24, subtherapeutic  11/23 @ 0611: HL 0.38, therapeutic X 1   Goal of Therapy:  Heparin  level 0.3-0.7 units/ml Monitor platelets by anticoagulation protocol: Yes   Plan:  11/23:  HL @ 0611 = 0.38, therapeutic X 1 - Will continue pt on current rate and recheck HL in 8 hrs.  Daily CBC while on heparin   Rayyan Burley D, PharmD 03/17/2024,6:44  AM

## 2024-03-17 NOTE — Evaluation (Signed)
 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 03/08/24 transferred to [facility] for initiation of CRRT. 03/09/24 seizure like episode, intubated. 03/15/24 extubated.    Clinical Impressions Dave Brown was seen for OT evaluation this date. Prior to hospital admission, pt was IND. Pt lives with children who work. Significantly hard of hearing limiting command following. Pt currently requires MOD A sup<>sit, tolerates ~10 min sitting. MIN A don/doff gown sitting. Pt initiates standing however returned to bed due to low BP. Pt would benefit from skilled OT to address noted impairments and functional limitations (see below for any additional details). Upon hospital discharge, recommend OT follow up.     If plan is discharge home, recommend the following:   A lot of help with walking and/or transfers;A lot of help with bathing/dressing/bathroom     Functional Status Assessment   Patient has had a recent decline in their functional status and demonstrates the ability to make significant improvements in function in a reasonable and predictable amount of time.     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 Restrictions Per Provider Order: No     Mobility Bed Mobility Overal bed mobility: Needs Assistance Bed Mobility: Rolling, Supine to Sit, Sit to  Supine Rolling: Supervision   Supine to sit: Mod assist Sit to supine: Mod assist        Transfers Overall transfer level: Needs assistance Equipment used: None Transfers: Sit to/from Stand Sit to Stand: Min assist           General transfer comment: clears rear, does not get fully upright 2/2 low BP on a-line      Balance Overall balance assessment: Needs assistance Sitting-balance support: No upper extremity supported, Feet supported Sitting balance-Leahy Scale: Fair                                     ADL either performed or assessed with clinical judgement   ADL Overall ADL's : Needs assistance/impaired                                       General ADL Comments: MIN A don/doff gown sitting. SETUP oral care bed level. MAX A pericare bed level     Vision         Perception         Praxis         Pertinent Vitals/Pain Pain Assessment Pain Assessment: Faces Faces Pain Scale: Hurts little more Pain Location: catheter site (pt pulling) Pain Descriptors / Indicators: Grimacing, Guarding, Discomfort Pain Intervention(s): Limited activity within patient's tolerance, Repositioned     Extremity/Trunk Assessment Upper Extremity Assessment Upper Extremity Assessment: Overall WFL for tasks assessed   Lower Extremity Assessment Lower Extremity Assessment: Generalized weakness  Communication Communication Communication: Impaired Factors Affecting Communication: Hearing impaired   Cognition Arousal: Alert Behavior During Therapy: Restless Cognition: No family/caregiver present to determine baseline, Difficult to assess Difficult to assess due to: Hard of hearing/deaf                             Following commands: Impaired Following commands impaired: Follows one step commands with increased time     Cueing  General Comments   Cueing Techniques: Verbal cues;Tactile cues  MAP scores 55-65 in  sitting   Exercises     Shoulder Instructions      Home Living Family/patient expects to be discharged to:: Private residence Living Arrangements: Children Available Help at Discharge: Family;Available PRN/intermittently Type of Home: House Home Access: Ramped entrance     Home Layout: One level     Bathroom Shower/Tub: Walk-in shower;Door   Bathroom Toilet: Handicapped height     Home Equipment: Agricultural Consultant (2 wheels);Rollator (4 wheels);Wheelchair - manual;Cane - single point;Lift chair;Adaptive equipment;Hand held shower head;Shower seat - built in   Additional Comments: lives with son and daughter in law who work. Home setup per 01/2024 admission      Prior Functioning/Environment Prior Level of Function : Independent/Modified Independent             Mobility Comments: walks without AD, ADLs Comments: assist for IADLs    OT Problem List: Decreased strength;Decreased range of motion;Decreased activity tolerance;Impaired balance (sitting and/or standing);Decreased safety awareness   OT Treatment/Interventions: Self-care/ADL training;Therapeutic exercise;Energy conservation;DME and/or AE instruction;Therapeutic activities;Patient/family education      OT Goals(Current goals can be found in the care plan section)   Acute Rehab OT Goals Patient Stated Goal: to sit in the chair OT Goal Formulation: With patient Time For Goal Achievement: 03/31/24 Potential to Achieve Goals: Good ADL Goals Pt Will Perform Grooming: with supervision;standing Pt Will Perform Lower Body Dressing: with supervision;sit to/from stand Pt Will Transfer to Toilet: with modified independence;ambulating;regular height toilet   OT Frequency:  Min 2X/week    Co-evaluation              AM-PAC OT 6 Clicks Daily Activity     Outcome Measure Help from another person eating meals?: None Help from another person taking care of personal grooming?: A Little Help from another person  toileting, which includes using toliet, bedpan, or urinal?: A Lot Help from another person bathing (including washing, rinsing, drying)?: A Lot Help from another person to put on and taking off regular upper body clothing?: A Little Help from another person to put on and taking off regular lower body clothing?: A Lot 6 Click Score: 16   End of Session Nurse Communication: Mobility status  Activity Tolerance: Patient tolerated treatment well Patient left: in bed;with call bell/phone within reach;with bed alarm set  OT Visit Diagnosis: Other abnormalities of gait and mobility (R26.89);Muscle weakness (generalized) (M62.81)                Time: 8494-8463 OT Time Calculation (min): 31 min Charges:  OT General Charges $OT Visit: 1 Visit OT Evaluation $OT Eval Moderate Complexity: 1 Mod OT Treatments $Self Care/Home Management : 23-37 mins  Elston Slot, M.S. OTR/L  03/17/24, 4:53 PM  ascom [phone]

## 2024-03-17 NOTE — Plan of Care (Signed)
  Problem: Clinical Measurements: Goal: Ability to maintain clinical measurements within normal limits will improve Outcome: Progressing Goal: Will remain free from infection Outcome: Progressing Goal: Diagnostic test results will improve Outcome: Progressing Goal: Respiratory complications will improve Outcome: Progressing   Problem: Education: Goal: Knowledge of General Education information will improve Description: Including pain rating scale, medication(s)/side effects and non-pharmacologic comfort measures Outcome: Not Progressing

## 2024-03-17 NOTE — Progress Notes (Signed)
 Rounding Note   Patient Name: Dave Brown Date of Encounter: 03/17/2024   [facility] Cardiologist: Newman JINNY Lawrence, MD   Subjective Echo obtained yesterday which showed hyperdynamic biventricular function without valvular disease or significant effusion.  No LVOT obstruction.  Levophed  has been weaned down to 4 this morning, continues on dobutamine .  His Levophed  requirement decreased quite a bit since starting the dobutamine .  He was asking for food this morning.  Scheduled Meds:  Chlorhexidine  Gluconate Cloth  6 each Topical Q0600   insulin  aspart  0-6 Units Subcutaneous Q4H   ipratropium-albuterol   3 mL Nebulization TID   multivitamin  1 tablet Per Tube QHS   mupirocin  ointment  1 Application Nasal BID   nystatin   5 mL Oral QID   mouth rinse  15 mL Mouth Rinse 4 times per day   pantoprazole  (PROTONIX ) IV  40 mg Intravenous QHS   sertraline   25 mg Per Tube Daily   thiamine  (VITAMIN B1) injection  100 mg Intravenous Daily   vancomycin  variable dose per unstable renal function (pharmacist dosing)   Does not apply See admin instructions   Continuous Infusions:  anticoagulant sodium citrate      dexmedetomidine  (PRECEDEX ) IV infusion 1.2 mcg/kg/hr (03/17/24 0700)   DOBUTamine  5 mcg/kg/min (03/17/24 0700)   heparin  1,600 Units/hr (03/17/24 0700)   norepinephrine  (LEVOPHED ) Adult infusion 4 mcg/min (03/17/24 0700)   PRN Meds: alteplase , anticoagulant sodium citrate , docusate sodium , fentaNYL  (SUBLIMAZE ) injection, heparin , ipratropium-albuterol , mouth rinse, mouth rinse, mouth rinse, polyethylene glycol, sodium chloride  flush   Vital Signs  Vitals:   03/17/24 0630 03/17/24 0645 03/17/24 0700 03/17/24 0703  BP:      Pulse: 78 80 75 76  Resp: 20 18 20 19   Temp:      TempSrc:      SpO2: 98% 98% 96% 97%  Weight:      Height:        Intake/Output Summary (Last 24 hours) at 03/17/2024 0709 Last data filed at 03/17/2024 0700 Gross per 24 hour  Intake 1549.28 ml   Output 1425 ml  Net 124.28 ml      03/17/2024    5:00 AM 03/16/2024    9:00 PM 03/16/2024    5:00 AM  Last 3 Weights  Weight (lbs) 281 lb 12 oz 278 lb 278 lb  Weight (kg) 127.8 kg 126.1 kg 126.1 kg      Telemetry Infrequent PVCs, sinus rhythm  ECG   - Personally Reviewed; sinus rhythm with first-degree AV block and prolonged QT  Physical Exam  GEN: Sitting up, no distress Neck: Unable to estimate JVD given habitus Cardiac: RRR, no murmurs, rubs, or gallops.  Respiratory: Clear to auscultation bilaterally. GI: Soft,  non-distended  MS: No edema; No deformity. Neuro:  Nonfocal  Psych: Normal affect   Labs High Sensitivity Troponin:  No results for input(s): TROPONINIHS in the last 720 hours.   Chemistry Recent Labs  Lab 03/12/24 0412 03/12/24 1605 03/14/24 0324 03/14/24 1611 03/15/24 0135 03/15/24 0509 03/15/24 1607 03/16/24 0414 03/17/24 0503  NA 133*   < > 135   < > 134*   < > 130* 136 137  K 4.1   < > 4.1   < > 4.3   < > 4.3 4.1 3.9  CL 101   < > 101   < > 101   < > 98 103 103  CO2 25   < > 25   < > 24   < > 25 23  23  GLUCOSE 106*   < > 145*   < > 145*   < > 255* 122* 117*  BUN 12   < > 14   < > 13   < > 16 22 29*  CREATININE 1.52*   < > 1.32*   < > 1.29*   < > 1.53* 2.13* 2.63*  CALCIUM  8.4*   < > 8.7*   < > 8.9   < > 9.0 9.6 9.6  MG 2.3   < > 2.3  --  2.4  --   --  2.4  --   PROT 5.8*  --   --   --   --   --   --   --   --   ALBUMIN 3.1*  3.0*   < > 3.1*   < >  --    < > 2.9* 3.1* 2.9*  AST 14*  --   --   --   --   --   --   --   --   ALT 12  --   --   --   --   --   --   --   --   ALKPHOS 80  --   --   --   --   --   --   --   --   BILITOT 0.5  --   --   --   --   --   --   --   --   GFRNONAA 47*   < > 55*   < > 57*   < > 46* 31* 24*  ANIONGAP 7   < > 10   < > 10   < > 8 11 11    < > = values in this interval not displayed.    Lipids  Recent Labs  Lab 03/16/24 0414  TRIG 155*    Hematology Recent Labs  Lab 03/15/24 0135 03/16/24 0414  03/17/24 0503  WBC 12.2* 7.6 8.7  RBC 3.50* 3.28* 3.08*  HGB 11.2* 10.6* 10.0*  HCT 35.7* 33.2* 30.6*  MCV 102.0* 101.2* 99.4  MCH 32.0 32.3 32.5  MCHC 31.4 31.9 32.7  RDW 14.6 14.1 14.2  PLT 116* 106* 106*   Thyroid  No results for input(s): TSH, FREET4 in the last 168 hours.   BNPNo results for input(s): BNP, PROBNP in the last 168 hours.  DDimer No results for input(s): DDIMER in the last 168 hours.   Radiology  ECHOCARDIOGRAM LIMITED Result Date: 03/16/2024    ECHOCARDIOGRAM LIMITED REPORT   Patient Name:   FREDRIC SLABACH Date of Exam: 03/16/2024 Medical Rec #:  [id number]   Height:       69.0 in Accession #:    [id number]  Weight:       278.0 lb Date of Birth:  10/18/1945    BSA:          2.376 m Patient Age:    78 years    BP:           126/46 mmHg Patient Gender: M           HR:           82 bpm. Exam Location:  [facility] Procedure: Limited Echo and Limited Color Doppler (Both Spectral and Color Flow            Doppler were utilized during procedure). Indications:     Shock R57.9  History:  Patient has prior history of Echocardiogram examinations, most                  recent 03/07/2024.  Sonographer:     Thedora Louder RDCS, FASE Referring Phys:  [id number] Ming Mcmannis Diagnosing Phys: Caron Poser  Sonographer Comments: This study was perform with patient positioned supine and on BiPaP. IMPRESSIONS  1. Left ventricular ejection fraction, by estimation, is >75%. The left ventricle has hyperdynamic function. Left ventricular endocardial border not optimally defined to evaluate regional wall motion. There is severe concentric left ventricular hypertrophy. Left ventricular diastolic parameters are consistent with Grade I diastolic dysfunction (impaired relaxation).  2. Right ventricular systolic function is normal. The right ventricular size is normal.  3. There is no evidence of pericardial effusion.  4. The mitral valve is degenerative. No evidence of mitral valve  regurgitation. No evidence of mitral stenosis.  5. The aortic valve has an indeterminant number of cusps. There is moderate calcification of the aortic valve. Aortic valve regurgitation is not visualized. Aortic valve sclerosis/calcification is present, without any evidence of aortic stenosis.  6. Aortic dilatation noted. There is mild dilatation of the aortic root, measuring 42 mm.  7. The inferior vena cava is dilated in size with <50% respiratory variability, suggesting right atrial pressure of 15 mmHg. Comparison(s): A prior study was performed on 03/07/2024. No significant change from prior study. FINDINGS  Left Ventricle: Left ventricular ejection fraction, by estimation, is >75%. The left ventricle has hyperdynamic function. Left ventricular endocardial border not optimally defined to evaluate regional wall motion. There is severe concentric left ventricular hypertrophy. Left ventricular diastolic parameters are consistent with Grade I diastolic dysfunction (impaired relaxation). Right Ventricle: The right ventricular size is normal. No increase in right ventricular wall thickness. Right ventricular systolic function is normal. Left Atrium: Left atrial size was not assessed. Right Atrium: Right atrial size was not assessed. Pericardium: There is no evidence of pericardial effusion. Mitral Valve: The mitral valve is degenerative in appearance. There is mild calcification of the mitral valve leaflet(s). Mild mitral annular calcification. No evidence of mitral valve stenosis. Tricuspid Valve: The tricuspid valve is not well visualized. Tricuspid valve regurgitation is not demonstrated. No evidence of tricuspid stenosis. Aortic Valve: The aortic valve has an indeterminant number of cusps. There is moderate calcification of the aortic valve. Aortic valve regurgitation is not visualized. Aortic valve sclerosis/calcification is present, without any evidence of aortic stenosis. Aortic valve mean gradient measures 8.0  mmHg. Aortic valve peak gradient measures 13.7 mmHg. Pulmonic Valve: The pulmonic valve was not assessed. Aorta: Aortic dilatation noted. There is mild dilatation of the aortic root, measuring 42 mm. Venous: The inferior vena cava is dilated in size with less than 50% respiratory variability, suggesting right atrial pressure of 15 mmHg. Additional Comments: Spectral Doppler performed. Color Doppler performed.  LEFT VENTRICLE PLAX 2D LVIDd:         4.30 cm   Diastology LVIDs:         3.00 cm   LV e' medial:    5.22 cm/s LV PW:         1.50 cm   LV E/e' medial:  16.2 LV IVS:        1.50 cm   LV e' lateral:   6.96 cm/s LVOT diam:     2.00 cm   LV E/e' lateral: 12.1 LVOT Area:     3.14 cm LV IVRT:       121 msec  RIGHT VENTRICLE  RV S prime:     13.80 cm/s TAPSE (M-mode): 1.9 cm LEFT ATRIUM         Index LA diam:    4.40 cm 1.85 cm/m  AORTIC VALVE AV Vmax:      185.00 cm/s AV Vmean:     129.000 cm/s AV VTI:       0.296 m AV Peak Grad: 13.7 mmHg AV Mean Grad: 8.0 mmHg  AORTA Ao Root diam: 4.20 cm MITRAL VALVE MV Area (PHT): 3.53 cm    SHUNTS MV Decel Time: 215 msec    Systemic Diam: 2.00 cm MV E velocity: 84.40 cm/s MV A velocity: 87.00 cm/s MV E/A ratio:  0.97 Caron Poser Electronically signed by Caron Poser Signature Date/Time: 03/16/2024/9:29:35 AM    Final     Cardiac Studies -TTE 03/07/2024 LVEF 60 to 65% with severe LVH, grade 3 diastolic dysfunction, no significant valvular disease -CT chest 02/2024 with severe aortic atherosclerosis and severe three-vessel coronary artery calcifications - TTE 01/2024 LVEF 60 to 65%, severe LVH, grade 3 diastolic dysfunction  Assessment & Plan  Acute on chronic respiratory failure HFpEF Undifferentiated shock Unclear cause.  Does have significantly elevated BNP in setting of renal failure.  EF has been preserved on previous echocardiograms.  Wide pulse pressure.  Fortunately, his pressor requirement has decreased since yesterday with addition of dobutamine .  We  obtained a repeat echo which showed hyperdynamic biventricular function without significant valvular disease, no pericardial effusion, no LVOT obstruction, or any other obvious cardiac causes of his shock state.  Co-ox/SVC saturation has been in the 60 to 80% range.  I do not have a great explanation why he has improved so much with dobutamine  given the way his echocardiogram looks.  Would wonder about PE also, though normal RV function on echo, also would not expect such a wide pulse pressure.  He may have significant underlying pulmonary hypertension due to longstanding OHS/OSA which is not being picked up on echo due to inadequate TR signals, and perhaps this is why he has gotten better with RV support.  Plan: - Continue empiric antibiotics per primary team - Volume management with dialysis - Continue to wean inotropes/pressors as tolerated - It would probably be a good idea for him to get a right heart cath at some point this hospitalization; I think the urgency is a bit less now since he has improved so much since yesterday.  If the pressors/dobutamine  are unable to be weaned, then we can do one sooner than later.   Acute on chronic renal failure -HD and volume management as above -If he has renal recovery, he will eventually need diuretics   Acute on chronic diastolic CHF Normal EF with severe LVH.  Hold GDMT for now given shock.  Consider amyloid/HCM/restrictive CM evaluation as outpatient   Elevated troponin Coronary artery calcifications Aortic atherosclerosis Up to 353, likely supply/demand mismatch in the setting of respiratory failure - No plans for ischemic evaluation given chronic renal failure.  He likely has underlying CAD given his severe three-vessel CAC, but options are limited.   Paroxysmal atrial fibrillation AIVR NSVT Rapid atrial fibrillation a few nights ago.  Amiodarone  and heparin  started.  Amiodarone  discontinued following this due to episode of AIVR.  Having  intermittent NSVT, likely due to vasopressors/inotropes  Plan: - If he has recurrence of rapid atrial fibrillation or sustained VT, would rebolus amiodarone  - Continue heparin  if no significant bleeding  Time Spent with Patient: I have spent a total of  41 minutes caring for this patient today face to face, ordering and reviewing labs/tests, reviewing prior records/medical history, examining the patient, establishing an assessment and plan, communicating results/findings to the patient/family, and documenting in the medical record.   For questions or updates, please contact [facility] Please consult [email or web address] for contact info under     Signed, Caron Poser, MD  03/17/2024, 7:09 AM

## 2024-03-17 NOTE — Evaluation (Addendum)
 Clinical/Bedside Swallow Evaluation Patient Details  Name: Dave Brown MRN: [id number] Date of Birth: 11/21/1945  Today's Date: 03/17/2024 Time: SLP Start Time (ACUTE ONLY): 1100 SLP Stop Time (ACUTE ONLY): 1200 SLP Time Calculation (min) (ACUTE ONLY): 60 min  Past Medical History:  Past Medical History:  Diagnosis Date   CHF (congestive heart failure) (HCC)    Degenerative joint disease    High cholesterol    Hypertension    Kidney stones    Obesity    Respiratory failure with hypoxia (HCC)    Sleep apnea    Past Surgical History: No past surgical history on file. HPI:  Pt is a 78 y.o. male with PMHx significant for HFpEF and COPD requiring 2L supplemental oxygen  who is transferred from [facility] where he admitted 11/12 with Acute on Chronic Hypoxic Respiratory Failure in the setting of Acute Decompensated HFpEF, circulatory shock, and AKI requiring initiation of CRRT.   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  On 11/15: seizure like episode, orally intubated in ICU.  Pt was extubated on 11/21.   CXR Imaging on 11/18: Similar small bilateral pleural effusions with bibasilar  opacities, favored to reflect atelectasis.  3. Pulmonary vascular congestion with possible mild interstitial  edema.    Assessment / Plan / Recommendation  Clinical Impression   Pt seen for BSE today. Pt awake, verbal and engaged w/ this SLP. Noted mild confusion and need for redirection during tasks intermittently- lengthy hospitalization now. Pt responded well to verbal/tactile cues. Noted frequent Belching during oral intake. OF NOTE: constant mild cough/throat clearing when moving about or Belching. He needed [facility] support for upright sitting in bed w/ head forward. Pt is HOH. On [place] O2 support 2: afebrile. WBC wnl.   Pt appears to present w/ grossly  functional oropharyngeal phase swallowing w/ the trial consistencies assessed today- no solids given d/t not wearing Dentures and lengthy oral intubation/hospitalization w/ deconditioning. W/ the trials given, no immediate, overt oropharyngeal phase dysphagia noted; No neuromuscular deficits noted. Pt consumed po trials w/ No immediate, overt clinical s/s of aspiration w/ the po trials. Mild throat clearing followed Belching -- Belching followed most>all po trials/swallowing. Suspect potential GERD underlying.  Pt appears at reduced risk for aspiration following general aspiration precautions w/ a modified diet consistency currently to initiation meals. However, pt does have challenging factors that could impact oropharyngeal swallowing to include lengthy illness/hospitalization; oral intubation; Belching; need for support positioning upright for meals/po's; Pulmonary decline. These factors can increase risk for aspiration, dysphagia as well as decreased oral intake overall.   During po trials, pt consumed consistencies given w/ no immediate, overt coughing, decline in vocal quality, or change in respiratory presentation during/post trials. O2 sats 98-99%. Oral phase appeared [facility] w/ timely bolus management and control of bolus propulsion for A-P transfer for swallowing - no solids given(not wearing his Dentures). Oral clearing achieved w/ all trials.  OM Exam appeared [facility] w/ no unilateral weakness noted. Speech Clear. Pt fed self w/ MOD setup support and positioning support as pt tended to lie reclined.   Recommend a Pureed consistency diet w/ well-moistened foods to re-initiate po intake/meals again s/p extubation and not wearing Dentures for several days; Thin liquids -- carefully monitor Cup sipping for small sips slowly, and pt should Hold Cup when drinking. MUST sit fully upright for oral intake. General aspiration precautions. Feeding  Support as needed. Pills WHOLE in Puree for safer, easier swallowing  -- encouraged now and for D/C to the pt.   Education given to pt on Pills in Puree; food consistencies and easy to eat options; general aspiration precautions; trials to upgrade diet over next 1-2 days when he is wearing Dentures and medical status has continued to improve. ST services will f/u next 1-2 days. Encouraged practice wearing Dentures to pt and NSG -- it has been several days since wearing them. Denture care. NSG updated, agreed. MD updated. Recommend Dietician f/u for support. Precautions posted in room, chart.  SLP Visit Diagnosis: Dysphagia, unspecified (R13.10) (lengthy illness/hospitalization; oral intubation; Belching; need for support positioning upright for meals/po's; Pulmonary decline)    Aspiration Risk  Mild aspiration risk;Risk for inadequate nutrition/hydration    Diet Recommendation   Thin;Dysphagia 1 (puree) (w/ ongoing trials to upgrade) = a Pureed consistency diet w/ well-moistened foods to re-initiate po intake/meals again s/p extubation and not wearing Dentures for several days; Thin liquids -- carefully monitor Cup sipping for small sips slowly, and pt should Hold Cup when drinking. MUST sit fully upright for oral intake. General aspiration precautions. Feeding Support as needed.   Medication Administration: Whole meds with puree (vs need to Crush in puree)    Other  Recommendations Recommended Consults:  (GERD?) Oral Care Recommendations: Oral care BID;Staff/trained caregiver to provide oral care (support)     Assistance Recommended at Discharge  FULL assistance at meals for prep and sitting Upright  Functional Status Assessment Patient has had a recent decline in their functional status and demonstrates the ability to make significant improvements in function in a reasonable and predictable amount of time.  Frequency and Duration min 2x/week  2 weeks       Prognosis Prognosis for improved oropharyngeal function: Fair (-Good) Barriers to Reach Goals: Time post  onset;Severity of deficits Barriers/Prognosis Comment: lengthy illness/hospitalization; oral intubation; Belching; need for support positioning upright for meals/po's; Pulmonary decline      Swallow Study   General Date of Onset: 03/08/24 HPI: Pt is a 78 y.o. male with PMHx significant for HFpEF and COPD requiring 2L supplemental oxygen  who is transferred from [facility] where he admitted 11/12 with Acute on Chronic Hypoxic Respiratory Failure in the setting of Acute Decompensated HFpEF, circulatory shock, and AKI requiring initiation of CRRT.   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  On 11/15: seizure like episode, orally intubated in ICU.  Pt was extubated on 11/21.   CXR Imaging on 11/18: Similar small bilateral pleural effusions with bibasilar  opacities, favored to reflect atelectasis.  3. Pulmonary vascular congestion with possible mild interstitial  edema. Type of Study: Bedside Swallow Evaluation Previous Swallow Assessment: none Diet Prior to this Study: NPO (ate a regular diet at home per pt) Temperature Spikes Noted: No (wbc 7.9) Respiratory Status: Nasal cannula (2L) History of Recent Intubation: Yes Total duration of intubation (days): 6 days Date extubated: 03/15/24 Behavior/Cognition: Alert;Cooperative;Pleasant mood;Distractible;Requires cueing (min) Oral Cavity Assessment: Within Functional Limits Oral Care Completed by SLP: Yes Oral Cavity - Dentition: Edentulous (has Dentures w/ him but not in) Vision: Functional for self-feeding Self-Feeding Abilities: Able to feed self;Needs assist;Needs set up;Total assist Patient Positioning: Upright in bed (FULL assist) Baseline Vocal Quality: Normal (gravely) Volitional Cough: Strong;Congested Volitional Swallow: Able to elicit    Oral/Motor/Sensory Function Overall Oral  Motor/Sensory  Function: Within functional limits   Ice Chips Ice chips: Within functional limits Presentation: Spoon (fed; 5 trials)   Thin Liquid Thin Liquid: Within functional limits Presentation: Cup;Self Fed (~6 ozs total) Other Comments: mild throat clear post Belching -- similar to at rest prior to po's    Nectar Thick Nectar Thick Liquid: Not tested   Honey Thick Honey Thick Liquid: Not tested   Puree Puree: Within functional limits Presentation: Self Fed;Spoon (supported; ~4 ozs) Other Comments: similar mild throat clear post Belching intermittently   Solid     Solid: Not tested        Comer Portugal, MS, CCC-SLP Speech Language Pathologist Rehab Services; [facility] [phone] (ascom) Lynnley Doddridge 03/17/2024,5:34 PM

## 2024-03-17 NOTE — Progress Notes (Signed)
 [facility] [place], [place] 03/17/24  Subjective:   Hospital day # 63 78 year old male with hypertension, heart failure with preserved ejection fraction, hyperlipidemia, stage IV CKD, obstructive sleep apnea, MGUS, chronic respiratory failure on 2 L oxygen  presented from [facility] with shortness of breath.  Transferred to [facility] for need of continuous dialysis.  Patient seen and evaluated on dialysis at bedside. Hemodialysis dialysis treatment flowsheet  Blood flow rate (mL/min): 300 Arterial pressures (mmHg): -81.59 Venous pressures (mmHg): 93.75 TMP (mmHg): 4.85 Ultrafiltration rate (mL/min): 183 Dialysate (mL/min): 300  Objective:  Vital signs in last 24 hours:  Temp:  [95 F (35 C)-98.6 F (37 C)] 97.9 F (36.6 C) (11/23 1200) Pulse Rate:  [68-112] 74 (11/23 1200) Resp:  [16-28] 22 (11/23 1200) BP: (95-149)/(49-62) 136/62 (11/23 1022) SpO2:  [90 %-100 %] 100 % (11/23 1200) Arterial Line BP: (84-140)/(37-64) 95/46 (11/23 1200) Weight:  [126.1 kg-128.3 kg] 128.3 kg (11/23 1020)  Weight change: 0 kg Filed Weights   03/17/24 0500 03/17/24 0801 03/17/24 1020  Weight: 127.8 kg 127.8 kg 128.3 kg    Intake/Output:    Intake/Output Summary (Last 24 hours) at 03/17/2024 1416 Last data filed at 03/17/2024 1300 Gross per 24 hour  Intake 2394.83 ml  Output 780 ml  Net 1614.83 ml     Physical Exam: General: Elderly  Pulm/lungs High flow  CVS/Heart Tachycardic  Abdomen:  Distended  Extremities: Dependent edema is present  Neurologic:  Alert  Skin: Warm, dry  Access: Left femoral dialysis catheter       Basic Metabolic Panel:  Recent Labs  Lab 03/12/24 0412 03/12/24 1605 03/13/24 0346 03/13/24 1650 03/14/24 0324 03/14/24 1611 03/15/24 0135 03/15/24 0509 03/15/24 1607 03/16/24 0414 03/17/24 0503  NA 133*   < > 135   < > 135 130* 134* 134* 130* 136 137  K 4.1   < > 4.0   < > 4.1 4.3 4.3 4.1 4.3 4.1 3.9  CL 101   < > 100   < >  101 95* 101 100 98 103 103  CO2 25   < > 26   < > 25 24 24 25 25 23 23   GLUCOSE 106*   < > 147*   < > 145* 220* 145* 157* 255* 122* 117*  BUN 12   < > 14   < > 14 13 13 13 16 22  29*  CREATININE 1.52*   < > 1.27*   < > 1.32* 1.35* 1.29* 1.27* 1.53* 2.13* 2.63*  CALCIUM  8.4*   < > 8.8*   < > 8.7* 8.2* 8.9 8.9 9.0 9.6 9.6  MG 2.3  --  2.3  --  2.3  --  2.4  --   --  2.4  --   PHOS 2.5   < > 2.7   < > 2.9 2.6  --  2.3* 2.3* 2.7 3.8   < > = values in this interval not displayed.     CBC: Recent Labs  Lab 03/12/24 0412 03/13/24 0346 03/14/24 0324 03/15/24 0135 03/16/24 0414 03/17/24 0503  WBC 14.7*  15.0* 9.8 11.1* 12.2* 7.6 8.7  NEUTROABS 13.2*  --   --   --   --   --   HGB 11.2*  11.2* 11.0* 11.0* 11.2* 10.6* 10.0*  HCT 35.3*  35.3* 34.9* 34.9* 35.7* 33.2* 30.6*  MCV 102.0*  101.7* 102.3* 102.3* 102.0* 101.2* 99.4  PLT 76*  75* 62* 84* 116* 106* 106*  Lab Results  Component Value Date   HEPBSAG NON REACTIVE 03/16/2024      Microbiology:  Recent Results (from the past 240 hours)  MRSA Next Gen by PCR, Nasal     Status: None   Collection Time: 03/08/24  7:23 PM   Specimen: Nasal Mucosa; Nasal Swab  Result Value Ref Range Status   MRSA by PCR Next Gen NOT DETECTED NOT DETECTED Final    Comment: (NOTE) The GeneXpert MRSA Assay (FDA approved for NASAL specimens only), is one component of a comprehensive MRSA colonization surveillance program. It is not intended to diagnose MRSA infection nor to guide or monitor treatment for MRSA infections. Test performance is not FDA approved in patients less than 55 years old. Performed at [facility], [address] [place], [place] [address]   Culture, Respiratory w Gram Stain     Status: None   Collection Time: 03/12/24  8:21 AM   Specimen: Tracheal Aspirate; Respiratory  Result Value Ref Range Status   Specimen Description   Final    TRACHEAL ASPIRATE Performed at [facility], [address] [place], [place] [address]    Special Requests   Final    NONE Performed at [facility], [address] [place], [place] [address]    Gram Stain   Final    RARE WBC PRESENT,BOTH PMN AND MONONUCLEAR FEW GRAM POSITIVE RODS    Culture   Final    MODERATE METHICILLIN RESISTANT STAPHYLOCOCCUS AUREUS WITHIN MIXED FLORA Performed at [facility], [address] [place], [place] [address]    Report Status 03/15/2024 FINAL  Final   Organism ID, Bacteria METHICILLIN RESISTANT STAPHYLOCOCCUS AUREUS  Final      Susceptibility   Methicillin resistant staphylococcus aureus - MIC*    CIPROFLOXACIN >=8 RESISTANT Resistant     ERYTHROMYCIN >=8 RESISTANT Resistant     GENTAMICIN <=0.5 SENSITIVE Sensitive     OXACILLIN >=4 RESISTANT Resistant     TETRACYCLINE <=1 SENSITIVE Sensitive     VANCOMYCIN  1 SENSITIVE Sensitive     TRIMETH/SULFA >=320 RESISTANT Resistant     CLINDAMYCIN <=0.25 SENSITIVE Sensitive     RIFAMPIN <=0.5 SENSITIVE Sensitive     Inducible Clindamycin NEGATIVE Sensitive     LINEZOLID  2 SENSITIVE Sensitive     * MODERATE METHICILLIN RESISTANT STAPHYLOCOCCUS AUREUS  MRSA Next Gen by PCR, Nasal     Status: Abnormal   Collection Time: 03/13/24 11:57 AM   Specimen: Nasal Mucosa; Nasal Swab  Result Value Ref Range Status   MRSA by PCR Next Gen DETECTED (A) NOT DETECTED Final    Comment: RESULT CALLED TO, READ BACK BY AND VERIFIED WITH: JORGE MORALES AT 1326 ON 03/13/24 BY SS (NOTE) The GeneXpert MRSA Assay (FDA approved for NASAL specimens only), is one component of a comprehensive MRSA colonization surveillance program. It is not intended to diagnose MRSA infection nor to guide or monitor treatment for MRSA infections. Test performance is not FDA approved in patients less than 60 years old. Performed at [facility], [address] [place], [place] [address]     Coagulation Studies: No results for input(s): LABPROT, INR in the last 72  hours.   Urinalysis: No results for input(s): COLORURINE, LABSPEC, PHURINE, GLUCOSEU, HGBUR, BILIRUBINUR, KETONESUR, PROTEINUR, UROBILINOGEN, NITRITE, LEUKOCYTESUR in the last 72 hours.  Invalid input(s): APPERANCEUR    Imaging: ECHOCARDIOGRAM LIMITED Result Date: 03/16/2024    ECHOCARDIOGRAM LIMITED REPORT   Patient Name:   Dave  Brown Date of Exam: 03/16/2024 Medical Rec #:  [id number]   Height:       69.0 in Accession #:    [id number]  Weight:       278.0 lb Date of Birth:  15-Aug-1945    BSA:          2.376 m Patient Age:    78 years    BP:           126/46 mmHg Patient Gender: M           HR:           82 bpm. Exam Location:  [facility] Procedure: Limited Echo and Limited Color Doppler (Both Spectral and Color Flow            Doppler were utilized during procedure). Indications:     Shock R57.9  History:         Patient has prior history of Echocardiogram examinations, most                  recent 03/07/2024.  Sonographer:     Thedora Louder RDCS, FASE Referring Phys:  [id number] TRAVIS SKIPINA Diagnosing Phys: Caron Poser  Sonographer Comments: This study was perform with patient positioned supine and on BiPaP. IMPRESSIONS  1. Left ventricular ejection fraction, by estimation, is >75%. The left ventricle has hyperdynamic function. Left ventricular endocardial border not optimally defined to evaluate regional wall motion. There is severe concentric left ventricular hypertrophy. Left ventricular diastolic parameters are consistent with Grade I diastolic dysfunction (impaired relaxation).  2. Right ventricular systolic function is normal. The right ventricular size is normal.  3. There is no evidence of pericardial effusion.  4. The mitral valve is degenerative. No evidence of mitral valve regurgitation. No evidence of mitral stenosis.  5. The aortic valve has an indeterminant number of cusps. There is moderate calcification of the aortic valve. Aortic valve regurgitation is not  visualized. Aortic valve sclerosis/calcification is present, without any evidence of aortic stenosis.  6. Aortic dilatation noted. There is mild dilatation of the aortic root, measuring 42 mm.  7. The inferior vena cava is dilated in size with <50% respiratory variability, suggesting right atrial pressure of 15 mmHg. Comparison(s): A prior study was performed on 03/07/2024. No significant change from prior study. FINDINGS  Left Ventricle: Left ventricular ejection fraction, by estimation, is >75%. The left ventricle has hyperdynamic function. Left ventricular endocardial border not optimally defined to evaluate regional wall motion. There is severe concentric left ventricular hypertrophy. Left ventricular diastolic parameters are consistent with Grade I diastolic dysfunction (impaired relaxation). Right Ventricle: The right ventricular size is normal. No increase in right ventricular wall thickness. Right ventricular systolic function is normal. Left Atrium: Left atrial size was not assessed. Right Atrium: Right atrial size was not assessed. Pericardium: There is no evidence of pericardial effusion. Mitral Valve: The mitral valve is degenerative in appearance. There is mild calcification of the mitral valve leaflet(s). Mild mitral annular calcification. No evidence of mitral valve stenosis. Tricuspid Valve: The tricuspid valve is not well visualized. Tricuspid valve regurgitation is not demonstrated. No evidence of tricuspid stenosis. Aortic Valve: The aortic valve has an indeterminant number of cusps. There is moderate calcification of the aortic valve. Aortic valve regurgitation is not visualized. Aortic valve sclerosis/calcification is present, without any evidence of aortic stenosis. Aortic valve mean gradient measures 8.0 mmHg. Aortic valve peak gradient measures 13.7 mmHg. Pulmonic Valve: The pulmonic valve was not assessed. Aorta: Aortic dilatation noted. There  is mild dilatation of the aortic root, measuring  42 mm. Venous: The inferior vena cava is dilated in size with less than 50% respiratory variability, suggesting right atrial pressure of 15 mmHg. Additional Comments: Spectral Doppler performed. Color Doppler performed.  LEFT VENTRICLE PLAX 2D LVIDd:         4.30 cm   Diastology LVIDs:         3.00 cm   LV e' medial:    5.22 cm/s LV PW:         1.50 cm   LV E/e' medial:  16.2 LV IVS:        1.50 cm   LV e' lateral:   6.96 cm/s LVOT diam:     2.00 cm   LV E/e' lateral: 12.1 LVOT Area:     3.14 cm LV IVRT:       121 msec  RIGHT VENTRICLE RV S prime:     13.80 cm/s TAPSE (M-mode): 1.9 cm LEFT ATRIUM         Index LA diam:    4.40 cm 1.85 cm/m  AORTIC VALVE AV Vmax:      185.00 cm/s AV Vmean:     129.000 cm/s AV VTI:       0.296 m AV Peak Grad: 13.7 mmHg AV Mean Grad: 8.0 mmHg  AORTA Ao Root diam: 4.20 cm MITRAL VALVE MV Area (PHT): 3.53 cm    SHUNTS MV Decel Time: 215 msec    Systemic Diam: 2.00 cm MV E velocity: 84.40 cm/s MV A velocity: 87.00 cm/s MV E/A ratio:  0.97 [place] Electronically signed by Caron Poser Signature Date/Time: 03/16/2024/9:29:35 AM    Final       Medications:    anticoagulant sodium citrate      dexmedetomidine  (PRECEDEX ) IV infusion Stopped (03/17/24 1026)   heparin  1,600 Units/hr (03/17/24 1300)   norepinephrine  (LEVOPHED ) Adult infusion 4 mcg/min (03/17/24 1300)   vancomycin  1,000 mg (03/17/24 1320)    Chlorhexidine  Gluconate Cloth  6 each Topical Q0600   insulin  aspart  0-6 Units Subcutaneous Q4H   ipratropium-albuterol   3 mL Nebulization TID   multivitamin  1 tablet Per Tube QHS   mupirocin  ointment  1 Application Nasal BID   nystatin   5 mL Oral QID   mouth rinse  15 mL Mouth Rinse 4 times per day   pantoprazole  (PROTONIX ) IV  40 mg Intravenous QHS   sertraline   25 mg Per Tube Daily   vancomycin  variable dose per unstable renal function (pharmacist dosing)   Does not apply See admin instructions   alteplase , anticoagulant sodium citrate , docusate sodium ,  fentaNYL  (SUBLIMAZE ) injection, heparin , ipratropium-albuterol , mouth rinse, mouth rinse, mouth rinse, polyethylene glycol, sodium chloride  flush  Assessment/ Plan:  78 y.o. 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  Baseline creatinine of 2.5-2.8. Acute kidney injury is thought to be secondary to pneumonia and circulatory shock causing ATN.  Patient responded well to IV Lasix  03/13/24.  However due to low CVP, further doses were held. CRRT stopped 11/22. First HD 11/23, tolerated Will hold dialysis for now and evaluate need for further treatment. Continue to monitor renal indices and fluid status. Lab Results  Component Value Date  CREATININE 2.63 (H) 03/17/2024   CREATININE 2.13 (H) 03/16/2024   CREATININE 1.53 (H) 03/15/2024     Intake/Output Summary (Last 24 hours) at 03/17/2024 1421 Last data filed at 03/17/2024 1300 Gross per 24 hour  Intake 2394.83 ml  Output 780 ml  Net 1614.83 ml     Acute respiratory failure Respiratory pathogen's panel by PCR is negative.  Blood cultures from 03/06/2024 are negative. Weaned to high flow   LOS: 9 Dave Brown 11/23/20252:16 PM  [address] [place], [place] [phone]

## 2024-03-17 NOTE — Progress Notes (Signed)
 MD made aware that patient has been tugging on foley and that it is slightly blood tinged.  Foley was removed and a condom cath was placed.  MD made aware that patient has large amount of bloody urine at this time and is on Heparin  drip.  Informed to hold heparin  drip and they would reevaluate in the am.

## 2024-03-17 NOTE — Consult Note (Signed)
 PHARMACY - ANTICOAGULATION CONSULT NOTE  Pharmacy Consult for heparin  infusion Indication: atrial fibrillation  No Known Allergies  Patient Measurements: Height: 5' 9.02 (175.3 cm) Weight: 128.3 kg (282 lb 13.6 oz) IBW/kg (Calculated) : 70.74 HEPARIN  DW (KG): 99.7  Vital Signs: Temp: 97.9 F (36.6 C) (11/23 1200) Temp Source: Oral (11/23 1200) BP: 136/62 (11/23 1022) Pulse Rate: 74 (11/23 1200)  Labs: Recent Labs    03/15/24 0135 03/15/24 0509 03/16/24 0414 03/16/24 2024 03/17/24 0503 03/17/24 0611 03/17/24 1407  HGB 11.2*  --  10.6*  --  10.0*  --  10.3*  HCT 35.7*  --  33.2*  --  30.6*  --  32.4*  PLT 116*  --  106*  --  106*  --  115*  APTT 91*  --   --   --   --   --   --   HEPARINUNFRC 0.34  --  <0.10* 0.24*  --  0.38 0.44  CREATININE 1.29*   < > 2.13*  --  2.63*  --  2.21*   < > = values in this interval not displayed.    Estimated Creatinine Clearance: 36.5 mL/min (A) (by C-G formula based on SCr of 2.21 mg/dL (H)).   Medical History: Past Medical History:  Diagnosis Date   CHF (congestive heart failure) (HCC)    Degenerative joint disease    High cholesterol    Hypertension    Kidney stones    Obesity    Respiratory failure with hypoxia (HCC)    Sleep apnea     Medications:  Patient was on heparin  infusion that was held 11/21 due to bleeding.  Assessment: Patient is a 78 y/o male with a PMH significant for HFpEF, COPD, and acute on chronic hypoxic respiratory failure, currently with AKI requiring CRRT. Pharmacy is consulted for initiation and management of a heparin  infusion ISO atrial fibrillation   11/22 @ 2024: HL 0.24, subtherapeutic  11/23 @ 0611: HL 0.38, therapeutic X 1  11/23 @ 1407: HL 0.44, therapeutic x 2  Goal of Therapy:  Heparin  level 0.3-0.7 units/ml Monitor platelets by anticoagulation protocol: Yes   Plan:  HL therapeutic x 2 Continue heparin  infusion at 1600 units/hr Daily HL while therapeutic Daily CBC while on  heparin   Kayla JULIANNA Blew, PharmD 03/17/2024,2:57 PM

## 2024-03-17 NOTE — Progress Notes (Signed)
 Called and updated daughter [name] at end of shift

## 2024-03-17 NOTE — Progress Notes (Signed)
 Pharmacy Antibiotic Note  Dave Brown is a 78 y.o. male w/ PMH of HFpEF, HLD, COPD, depression admitted on 03/08/2024 with AKI and now pneumonia. Pharmacy has been consulted for vancomycin  dosing. Previously on CRRT which was stopped 11/21.  Patient received HD 11/23 for about 2 hours at BFR 299  Vancomycin  random this AM was 20  Plan:   Will give vancomycin  1 g IV x 1 again today. Re-check a level tomorrow AM. Follow-up plan per nephrology. UOP is picking up. Plan for 10d of therapy per PCCM team  Height: 5' 9.02 (175.3 cm) Weight: 127.8 kg (281 lb 12 oz) IBW/kg (Calculated) : 70.74  Temp (24hrs), Avg:97.5 F (36.4 C), Min:95 F (35 C), Max:98.6 F (37 C)  Recent Labs  Lab 03/13/24 0346 03/13/24 1650 03/14/24 0324 03/14/24 1611 03/15/24 0135 03/15/24 0509 03/15/24 1607 03/16/24 0414 03/17/24 0503  WBC 9.8  --  11.1*  --  12.2*  --   --  7.6 8.7  CREATININE 1.27*   < > 1.32*   < > 1.29* 1.27* 1.53* 2.13* 2.63*  LATICACIDVEN  --   --   --   --  0.7  --   --   --   --   VANCORANDOM  --   --   --   --   --   --   --  17 20   < > = values in this interval not displayed.    Estimated Creatinine Clearance: 30.6 mL/min (A) (by C-G formula based on SCr of 2.63 mg/dL (H)).    No Known Allergies  Antimicrobials this admission: 11/18 Zosyn  >> 11/22 11/20 vancomycin  >>   Microbiology results: 11/12 BCx: NG final 11/18 Sputum: MRSA 11/19 MRSA PCR: positive  Thank you for allowing pharmacy to be a part of this patient's care.  Marolyn KATHEE Mare 03/17/2024 11:44 AM

## 2024-03-17 NOTE — Progress Notes (Signed)
 NAME:  Dave Brown, MRN:  [id number], DOB:  12/24/1945, LOS: 9 ADMISSION DATE:  03/08/2024 History of Present Illness:  Dave Brown is a 78 y.o male with a past medical history significant for  HFpEF,, hypertension, hyperlipidemia, CKD stage IV, OSA, MGUS (12/21/2011), chronic respiratory failure on 2 L presented to [facility] on 03/06/24 with shortness of breath x 1 day along with mild increase in his lower extremity edema and orthopnea. He denied any fevers, chills, chest pain.     Of note, the patient was recently admitted to the hospital from 02/01/2024 to 02/05/2024 for respiratory failure due to CHF.  The patient did not require oxygen  at the time of his discharge.  His discharge weight was 286.  At his last office visit with his primary provider his weight was 291 pounds on 02/22/24.  Follow up with pulmonary on 03/04/24 office weight 29.   He was discharged home with furosemide  40 mg daily during last hospitalization   ED Course: Initial Vital Signs: afebrile and hemodynamically stable. Oxygen  saturation was 93-97% on 4 L  Significant Labs: WBC 11.0, hemoglobin 12.3, platelets 138. Sodium 141, potassium 4.1, bicarbonate 31, serum creatinine 4.15. proBNP 16,139. PCT 0.13. Lactic acid 1.9 >> 0.8. COVID-negative  Imaging Chest X-ray>>patchy opacities right lower lobe and left lower lobe  Medications Administered: furosemide  40 mg IV, Solu-Medrol  125 mg, ceftriaxone , and azithromycin . He was given albuterol  and Atrovent .    [facility] asked to admit for further workup and treatment.  He gradually became hypotensive requiring initiation of low dose Levophed  (3 to 5 mcg), PCCM was consulted.  Nephrology was consulted for oliguria. Case was discussed with nephrology and it was felt the patient need to be initiated on CRRT. Subsequently, initial request was made to transfer the patient to [facility]. In order to expedite care, he is being transferred to [facility] regional ICU to critical care service where  nephrology will be consulted for continued management of his renal failure and fluid overload.   Pertinent  Medical History  As above.   Significant Hospital Events: Including procedures, antibiotic start and stop dates in addition to other pertinent events   11/12: Admitted to [facility] for treatment of Acute Decompensated HFpEF and AKI. 11/13: BP decreased, started on Levophed .  PCCM consulted.  ABX discontinued. 11/14: Transfer to [facility] for initiation of CRRT.  11/15: seizure like episode, intubated 11/16: Unable to wean; PICC line placed 11/21: Extubated to Bipap. Added Dobutamine .   Interim History / Subjective:  -- Patient remains on bipap this AM.  -- WBC stable at 7.6. H&H Stable. Coox SvO2 67%.  -- Electrolytes within normal range.  -- NE decreased to 4. Remains on Dobutamin at 5.  Objective    Blood pressure (!) 95/49, pulse 87, temperature (!) 95 F (35 C), temperature source Axillary, resp. rate 20, height 5' 9.02 (1.753 m), weight 127.8 kg, SpO2 98%. CVP:  [9 mmHg-15 mmHg] 10 mmHg  FiO2 (%):  [30 %-50 %] 30 %   Intake/Output Summary (Last 24 hours) at 03/17/2024 0912 Last data filed at 03/17/2024 0800 Gross per 24 hour  Intake 1617.39 ml  Output 1560 ml  Net 57.39 ml   Filed Weights   03/16/24 2100 03/17/24 0500 03/17/24 0801  Weight: 126.1 kg 127.8 kg 127.8 kg    Examination: General: On nasal cannula. Appears comfortable  HENT: Supple neck, reactive pupils, EOMI  Lungs: Coarse breath sounds bilaterally  Cardiovascular: Normal S1, Normal S2, RRR Abdomen: Soft,  non tender, non distended, +BS  Extremities: Warm, No edema  Labs and imaging were reviewed.   Assessment and Plan  #Acute Hypoxic Respiratory Failure requiring intubation and MV 11/15 #Acute on Chronic HFpEF #MRSA Pneumonia  #Acute Renal Failure on CKD #Cardiogenic Shock #T2DM #Toxic Metabolic Encephalopathy #Paroxysmal A.fib    Neuro - Encephalopathic secondary to hypercarbia,  without improvement following BiPAP. Required dexmedetomidine  for sedation to tolerate care (CRRT, BIPAP, etc...). Patient developed as seizure like episode with further encephalopathy and respiratory distress. EEG Normal. CT head unremarkable. Possibole due  to hypercapnic resp failure. Extubated on 11/22. Remains encephalopathic and on low dose Dex.  CV - Decompensated HFpEF with anasarca and concomitant renal failure. TTE with severe LVH and diastolic dysfunction, suspect also component of pulmonary hypertension from untreated OSA/OHS.  Appreciate input from cardiology. Continue to titrate vasopressors for goal MAP > 60 mmHg. Wide pulse pressure. I think this is a mixed distributive cardiogenic shock. He appears to be volume down based on low CVP and Collapsible IVC. Optimally he should have a Swan but unfortunately we do not have the capabilities to do that here. I will plan to give him fluids and assess response. Appreciate cardiology's input.  Pulm - Intubated due to worsening encephalopathy as well as worsening respiratory failure with hypoxia and hypercapnia. CRRT for volume mobilization, and now vented, trending blood gas to ensure ventilation and oxygenation. Extubated 11/21.  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 Started on CRRT. Total negative -9L. Running Even as increased NE requirements. Off CRRT since yesterday. Will coordinate with renal for iHD if needed. Urine output has been good.  Endo - ICU glycemic protocol. Hem/Onc - C/w Heparin  drip.  ID -  MRSA Pneumonia, will continue vancomycin  for a total of 10 days.   Critical care time: 36 minutes     Darrin Barn, MD [facility] 03/17/2024 9:12 AM

## 2024-03-17 NOTE — Plan of Care (Signed)

## 2024-03-17 NOTE — Progress Notes (Signed)
   03/17/24 1022  Vitals  Temp 97.7 F (36.5 C)  Temp Source Oral  BP 136/62  MAP (mmHg) 84  BP Method Automatic  Patient Position (if appropriate) Lying  Pulse Rate 73  Pulse Rate Source Monitor  ECG Heart Rate 73  Resp (!) 21  Oxygen  Therapy  SpO2 96 %  O2 Device Nasal Cannula  O2 Flow Rate (L/min) 2 L/min  Patient Activity (if Appropriate) In bed  Pulse Oximetry Type Continuous  Oximetry Probe Site Changed No  During Treatment Monitoring  Blood Flow Rate (mL/min) 299 mL/min  Arterial Pressure (mmHg) -132.72 mmHg  Venous Pressure (mmHg) 147.06 mmHg  TMP (mmHg) 3.23 mmHg  Ultrafiltration Rate (mL/min) 184 mL/min  Dialysate Flow Rate (mL/min) 300 ml/min  Dialysate Potassium Concentration 3  Dialysate Calcium  Concentration 2.5  Duration of HD Treatment -hour(s) 1.97 hour(s)  Cumulative Fluid Removed (mL) per Treatment  -505.55  HD Safety Checks Performed Yes  Intra-Hemodialysis Comments Tx completed  Post Treatment  Dialyzer Clearance Clear  Hemodialysis Intake (mL) 0 mL  Liters Processed 42.7  Fluid Removed (mL) -500 mL  Tolerated HD Treatment Yes  Hemodialysis Catheter Left Femoral vein  Placement Date/Time: 03/08/24 2030   Placed prior to admission: No  Time Out: Correct patient;Correct site;Correct procedure  Maximum sterile barrier precautions: Hand hygiene;Mask;Sterile gown;Sterile gloves;Large sterile sheet;Sterile probe cover  S...  Site Condition No complications  Blue Lumen Status Flushed;Heparin  locked;Dead end cap in place  Red Lumen Status Flushed;Heparin  locked;Dead end cap in place  Purple Lumen Status N/A  Catheter fill solution Heparin  1000 units/ml  Catheter fill volume (Arterial) 1.8 cc  Catheter fill volume (Venous) 1.8  Dressing Type Transparent  Dressing Status Antimicrobial disc/dressing in place;Clean, Dry, Intact  Interventions New dressing;Dressing changed;Antimicrobial disc changed  Drainage Description None  Dressing Change Due 03/24/24   Post treatment catheter status Capped and Clamped   Pt tolerated tx well. Towards the end of tx, arterial line was not in place and bp showed it was in the sbp was in the 60's and I  alerted icu nurse and she increased levo to 5 and adjusted placement of line. Dr. Preston ICU Doctor came by and gave orders to give pt 500 cc. 500 cc fluid was given to pt. No other concerns and complications to tx.

## 2024-03-17 NOTE — Consult Note (Signed)
 PHARMACY CONSULT NOTE - ELECTROLYTES  Pharmacy Consult for Electrolyte Monitoring and Replacement   Recent Labs: Height: 5' 9.02 (175.3 cm) Weight: 127.8 kg (281 lb 12 oz) IBW/kg (Calculated) : 70.74 Estimated Creatinine Clearance: 30.6 mL/min (A) (by C-G formula based on SCr of 2.63 mg/dL (H)). Potassium  Date Value  03/17/2024 3.9 mmol/L  07/18/2013 3.4 mEq/L (L)   Magnesium (mg/dL)  Date Value  88/77/7974 2.4   Calcium  (mg/dL)  Date Value  88/76/7974 9.6  07/18/2013 10.1   Albumin (g/dL)  Date Value  88/76/7974 2.9 (L)  07/18/2013 3.8   Phosphorus (mg/dL)  Date Value  88/76/7974 3.8   Sodium  Date Value  03/17/2024 137 mmol/L  07/18/2013 138 mEq/L   Assessment  Dave Brown is a 78 y.o. male presenting with AKI requiring CRRT. PMH significant for HFpEF and COPD requiring 2L supplemental oxygen . Pharmacy has been consulted to monitor and replace electrolytes.  Goal of Therapy: Electrolytes WNL  Plan:  No electrolyte replacement warranted for today. Scr continues to trend up Check BMP tomorrow  Thank you for allowing pharmacy to be a part of this patient's care.  Dave Brown 03/17/2024 7:04 AM

## 2024-03-18 DIAGNOSIS — I5030 Unspecified diastolic (congestive) heart failure: Secondary | ICD-10-CM

## 2024-03-18 LAB — LACTIC ACID, PLASMA: Lactic Acid, Venous: 0.8 mmol/L (ref 0.5–1.9)

## 2024-03-18 LAB — GLUCOSE, CAPILLARY
Glucose-Capillary: 113 mg/dL — ABNORMAL HIGH (ref 70–99)
Glucose-Capillary: 153 mg/dL — ABNORMAL HIGH (ref 70–99)
Glucose-Capillary: 117 mg/dL — ABNORMAL HIGH (ref 70–99)
Glucose-Capillary: 108 mg/dL — ABNORMAL HIGH (ref 70–99)

## 2024-03-18 LAB — RENAL FUNCTION PANEL
Albumin: 3.2 g/dL — ABNORMAL LOW (ref 3.5–5.0)
Anion gap: 11 (ref 5–15)
BUN: 31 mg/dL — ABNORMAL HIGH (ref 8–23)
CO2: 23 mmol/L (ref 22–32)
Calcium: 9.6 mg/dL (ref 8.9–10.3)
Chloride: 103 mmol/L (ref 98–111)
Creatinine, Ser: 2.92 mg/dL — ABNORMAL HIGH (ref 0.61–1.24)
GFR, Estimated: 21 mL/min — ABNORMAL LOW (ref >60)
Glucose, Bld: 115 mg/dL — ABNORMAL HIGH (ref 70–99)
Phosphorus: 4 mg/dL (ref 2.5–4.6)
Potassium: 4.2 mmol/L (ref 3.5–5.1)
Sodium: 136 mmol/L (ref 135–145)

## 2024-03-18 LAB — CBC
HCT: 29.5 % — ABNORMAL LOW (ref 39.0–52.0)
Hemoglobin: 9.3 g/dL — ABNORMAL LOW (ref 13.0–17.0)
MCH: 31.5 pg (ref 26.0–34.0)
MCHC: 31.5 g/dL (ref 30.0–36.0)
MCV: 100 fL (ref 80.0–100.0)
Platelets: 144 K/uL — ABNORMAL LOW (ref 150–400)
RBC: 2.95 MIL/uL — ABNORMAL LOW (ref 4.22–5.81)
RDW: 14.1 % (ref 11.5–15.5)
WBC: 10.5 K/uL (ref 4.0–10.5)
nRBC: 0 % (ref 0.0–0.2)

## 2024-03-18 LAB — COOXEMETRY PANEL
Carboxyhemoglobin: 2.1 % — ABNORMAL HIGH (ref 0.5–1.5)
Methemoglobin: 1.1 % (ref 0.0–1.5)
O2 Saturation: 95.4 %
Total hemoglobin: 9.5 g/dL — ABNORMAL LOW (ref 12.0–16.0)
Total oxygen content: 92.3 %

## 2024-03-18 LAB — VANCOMYCIN, RANDOM: Vancomycin Rm: 17 ug/mL

## 2024-03-18 LAB — HEPATITIS B SURFACE ANTIBODY, QUANTITATIVE: Hep B S AB Quant (Post): <3.5 m[IU]/mL — ABNORMAL LOW

## 2024-03-18 LAB — HEPARIN LEVEL (UNFRACTIONATED)
Heparin Unfractionated: <0.1 [IU]/mL — ABNORMAL LOW (ref 0.30–0.70)
Heparin Unfractionated: <0.1 [IU]/mL — ABNORMAL LOW (ref 0.30–0.70)

## 2024-03-18 LAB — ZINC: Zinc: 42 ug/dL — ABNORMAL LOW (ref 44–115)

## 2024-03-18 LAB — COPPER, SERUM: Copper: 100 ug/dL (ref 69–132)

## 2024-03-18 MED ORDER — DIPHENHYDRAMINE HCL 50 MG/ML IJ SOLN
50.0000 mg | Freq: Once | INTRAMUSCULAR | Status: AC
  Administered 2024-03-19: 50 mg via INTRAVENOUS
  Filled 2024-03-18: qty 1

## 2024-03-18 MED ORDER — MELATONIN 5 MG PO TABS
2.5000 mg | ORAL_TABLET | Freq: Every day | ORAL | Status: AC
Start: 2024-03-19 — End: ?
  Administered 2024-03-19 – 2024-04-04 (×17): 2.5 mg via ORAL
  Filled 2024-03-18 (×16): qty 1

## 2024-03-18 MED ORDER — RENA-VITE PO TABS
1.0000 | ORAL_TABLET | Freq: Every day | ORAL | Status: DC
  Administered 2024-03-18 – 2024-04-04 (×17): 1 via ORAL
  Filled 2024-03-18 (×17): qty 1

## 2024-03-18 MED ORDER — MIDODRINE HCL 5 MG PO TABS
5.0000 mg | ORAL_TABLET | Freq: Three times a day (TID) | ORAL | Status: DC
  Administered 2024-03-18 – 2024-03-22 (×7): 5 mg via ORAL
  Filled 2024-03-18 (×7): qty 1

## 2024-03-18 MED ORDER — ENSURE MAX PROTEIN PO LIQD
11.0000 [oz_av] | Freq: Three times a day (TID) | ORAL | Status: DC
  Administered 2024-03-18 – 2024-03-26 (×6): 11 [oz_av] via ORAL

## 2024-03-18 MED ORDER — DIAZEPAM 2 MG PO TABS
2.0000 mg | ORAL_TABLET | Freq: Every evening | ORAL | Status: DC | PRN
  Administered 2024-03-18: 2 mg via ORAL
  Filled 2024-03-18: qty 1

## 2024-03-18 NOTE — Progress Notes (Signed)
 [facility] [place], [place] 03/18/24  Subjective:   Hospital day # 20 78 year old male with hypertension, heart failure with preserved ejection fraction, hyperlipidemia, stage IV CKD, obstructive sleep apnea, MGUS, chronic respiratory failure on 2 L oxygen  presented from [facility] with shortness of breath.  Transferred to [facility] for need of continuous dialysis.  Update:  Good urine output of 1.4 L. Patient awake and alert. Still on norepinephrine  this a.m.   Objective:  Vital signs in last 24 hours:  Temp:  [97.7 F (36.5 C)-98.7 F (37.1 C)] 98.1 F (36.7 C) (11/24 0700) Pulse Rate:  [68-103] 74 (11/24 0700) Resp:  [16-22] 21 (11/24 0700) BP: (95-149)/(49-97) 114/97 (11/24 0010) SpO2:  [94 %-100 %] 100 % (11/24 0759) Arterial Line BP: (95-135)/(39-56) 116/46 (11/24 0700) FiO2 (%):  [28 %] 28 % (11/24 0759) Weight:  [127.1 kg-128.3 kg] 127.1 kg (11/24 0500)  Weight change: 1.7 kg Filed Weights   03/17/24 0801 03/17/24 1020 03/18/24 0500  Weight: 127.8 kg 128.3 kg 127.1 kg    Intake/Output:    Intake/Output Summary (Last 24 hours) at 03/18/2024 0815 Last data filed at 03/18/2024 0740 Gross per 24 hour  Intake 1809.99 ml  Output 770 ml  Net 1039.99 ml     Physical Exam: General: Elderly  Pulm/lungs Scattered rhonchi, normal effort  CVS/Heart S1S2 no rubs  Abdomen:  Distended  Extremities: Dependent edema is present  Neurologic: Alert, hard of hearing  Skin: Warm, dry  Access: Left femoral temporary dialysis catheter       Basic Metabolic Panel:  Recent Labs  Lab 03/12/24 0412 03/12/24 1605 03/13/24 0346 03/13/24 1650 03/14/24 0324 03/14/24 1611 03/15/24 0135 03/15/24 0509 03/15/24 1607 03/16/24 0414 03/17/24 0503 03/17/24 1407 03/18/24 0520  NA 133*   < > 135   < > 135   < > 134*   < > 130* 136 137 135 136  K 4.1   < > 4.0   < > 4.1   < > 4.3   < > 4.3 4.1 3.9 3.8 4.2  CL 101   < > 100   < > 101   < > 101   < > 98  103 103 102 103  CO2 25   < > 26   < > 25   < > 24   < > 25 23 23 24 23   GLUCOSE 106*   < > 147*   < > 145*   < > 145*   < > 255* 122* 117* 126* 115*  BUN 12   < > 14   < > 14   < > 13   < > 16 22 29* 23 31*  CREATININE 1.52*   < > 1.27*   < > 1.32*   < > 1.29*   < > 1.53* 2.13* 2.63* 2.21* 2.92*  CALCIUM  8.4*   < > 8.8*   < > 8.7*   < > 8.9   < > 9.0 9.6 9.6 9.4 9.6  MG 2.3  --  2.3  --  2.3  --  2.4  --   --  2.4  --   --   --   PHOS 2.5   < > 2.7   < > 2.9   < >  --    < > 2.3* 2.7 3.8 3.0 4.0   < > = values in this interval not displayed.     CBC: Recent Labs  Lab 03/12/24 0412 03/13/24  9653 03/15/24 0135 03/16/24 0414 03/17/24 0503 03/17/24 1407 03/18/24 0520  WBC 14.7*  15.0*   < > 12.2* 7.6 8.7 7.9 10.5  NEUTROABS 13.2*  --   --   --   --   --   --   HGB 11.2*  11.2*   < > 11.2* 10.6* 10.0* 10.3* 9.3*  HCT 35.3*  35.3*   < > 35.7* 33.2* 30.6* 32.4* 29.5*  MCV 102.0*  101.7*   < > 102.0* 101.2* 99.4 100.0 100.0  PLT 76*  75*   < > 116* 106* 106* 115* 144*   < > = values in this interval not displayed.      Lab Results  Component Value Date   HEPBSAG NON REACTIVE 03/16/2024      Microbiology:  Recent Results (from the past 240 hours)  MRSA Next Gen by PCR, Nasal     Status: None   Collection Time: 03/08/24  7:23 PM   Specimen: Nasal Mucosa; Nasal Swab  Result Value Ref Range Status   MRSA by PCR Next Gen NOT DETECTED NOT DETECTED Final    Comment: (NOTE) The GeneXpert MRSA Assay (FDA approved for NASAL specimens only), is one component of a comprehensive MRSA colonization surveillance program. It is not intended to diagnose MRSA infection nor to guide or monitor treatment for MRSA infections. Test performance is not FDA approved in patients less than 42 years old. Performed at  [facility], [address] [place], [place] [address]   Culture, Respiratory w Gram Stain     Status: None   Collection Time: 03/12/24  8:21 AM   Specimen:  Tracheal Aspirate; Respiratory  Result Value Ref Range Status   Specimen Description   Final    TRACHEAL ASPIRATE Performed at [facility], [address] [place], [place] [address]    Special Requests   Final    NONE Performed at [facility], [address] [place], [place] [address]    Gram Stain   Final    RARE WBC PRESENT,BOTH PMN AND MONONUCLEAR FEW GRAM POSITIVE RODS    Culture   Final    MODERATE METHICILLIN RESISTANT STAPHYLOCOCCUS AUREUS WITHIN MIXED FLORA Performed at [facility], [address] [place], [place] [address]    Report Status 03/15/2024 FINAL  Final   Organism ID, Bacteria METHICILLIN RESISTANT STAPHYLOCOCCUS AUREUS  Final      Susceptibility   Methicillin resistant staphylococcus aureus - MIC*    CIPROFLOXACIN >=8 RESISTANT Resistant     ERYTHROMYCIN >=8 RESISTANT Resistant     GENTAMICIN <=0.5 SENSITIVE Sensitive     OXACILLIN >=4 RESISTANT Resistant     TETRACYCLINE <=1 SENSITIVE Sensitive     VANCOMYCIN  1 SENSITIVE Sensitive     TRIMETH/SULFA >=320 RESISTANT Resistant     CLINDAMYCIN <=0.25 SENSITIVE Sensitive     RIFAMPIN <=0.5 SENSITIVE Sensitive     Inducible Clindamycin NEGATIVE Sensitive     LINEZOLID  2 SENSITIVE Sensitive     * MODERATE METHICILLIN RESISTANT STAPHYLOCOCCUS AUREUS  MRSA Next Gen by PCR, Nasal     Status: Abnormal   Collection Time: 03/13/24 11:57 AM   Specimen: Nasal Mucosa; Nasal Swab  Result Value Ref Range Status   MRSA by PCR Next Gen DETECTED (A) NOT DETECTED Final    Comment: RESULT CALLED TO, READ BACK BY AND VERIFIED WITH: JORGE MORALES AT 1326 ON 03/13/24 BY SS (NOTE) The GeneXpert MRSA Assay (FDA approved for NASAL specimens only), is  one component of a comprehensive MRSA colonization surveillance program. It is not intended to diagnose MRSA infection nor to guide or monitor treatment for MRSA infections. Test performance is not FDA approved in patients less than 63  years old. Performed at [facility], [address] [place], [place] [address]     Coagulation Studies: No results for input(s): LABPROT, INR in the last 72 hours.   Urinalysis: No results for input(s): COLORURINE, LABSPEC, PHURINE, GLUCOSEU, HGBUR, BILIRUBINUR, KETONESUR, PROTEINUR, UROBILINOGEN, NITRITE, LEUKOCYTESUR in the last 72 hours.  Invalid input(s): APPERANCEUR    Imaging: ECHOCARDIOGRAM LIMITED Result Date: 03/16/2024    ECHOCARDIOGRAM LIMITED REPORT   Patient Name:   Dave Brown Date of Exam: 03/16/2024 Medical Rec #:  [id number]   Height:       69.0 in Accession #:    [id number]  Weight:       278.0 lb Date of Birth:  Apr 01, 1946    BSA:          2.376 m Patient Age:    78 years    BP:           126/46 mmHg Patient Gender: M           HR:           82 bpm. Exam Location:  [facility] Procedure: Limited Echo and Limited Color Doppler (Both Spectral and Color Flow            Doppler were utilized during procedure). Indications:     Shock R57.9  History:         Patient has prior history of Echocardiogram examinations, most                  recent 03/07/2024.  Sonographer:     Thedora Louder RDCS, FASE Referring Phys:  [id number] TRAVIS SKIPINA Diagnosing Phys: Caron Poser  Sonographer Comments: This study was perform with patient positioned supine and on BiPaP. IMPRESSIONS  1. Left ventricular ejection fraction, by estimation, is >75%. The left ventricle has hyperdynamic function. Left ventricular endocardial border not optimally defined to evaluate regional wall motion. There is severe concentric left ventricular hypertrophy. Left ventricular diastolic parameters are consistent with Grade I diastolic dysfunction (impaired relaxation).  2. Right ventricular systolic function is normal. The right ventricular size is normal.  3. There is no evidence of pericardial effusion.  4. The mitral valve is degenerative. No evidence of mitral valve regurgitation. No  evidence of mitral stenosis.  5. The aortic valve has an indeterminant number of cusps. There is moderate calcification of the aortic valve. Aortic valve regurgitation is not visualized. Aortic valve sclerosis/calcification is present, without any evidence of aortic stenosis.  6. Aortic dilatation noted. There is mild dilatation of the aortic root, measuring 42 mm.  7. The inferior vena cava is dilated in size with <50% respiratory variability, suggesting right atrial pressure of 15 mmHg. Comparison(s): A prior study was performed on 03/07/2024. No significant change from prior study. FINDINGS  Left Ventricle: Left ventricular ejection fraction, by estimation, is >75%. The left ventricle has hyperdynamic function. Left ventricular endocardial border not optimally defined to evaluate regional wall motion. There is severe concentric left ventricular hypertrophy. Left ventricular diastolic parameters are consistent with Grade I diastolic dysfunction (impaired relaxation). Right Ventricle: The right ventricular size is normal. No increase in right ventricular wall thickness. Right ventricular systolic function is normal. Left Atrium: Left atrial size was not assessed. Right Atrium: Right atrial size was not assessed. Pericardium:  There is no evidence of pericardial effusion. Mitral Valve: The mitral valve is degenerative in appearance. There is mild calcification of the mitral valve leaflet(s). Mild mitral annular calcification. No evidence of mitral valve stenosis. Tricuspid Valve: The tricuspid valve is not well visualized. Tricuspid valve regurgitation is not demonstrated. No evidence of tricuspid stenosis. Aortic Valve: The aortic valve has an indeterminant number of cusps. There is moderate calcification of the aortic valve. Aortic valve regurgitation is not visualized. Aortic valve sclerosis/calcification is present, without any evidence of aortic stenosis. Aortic valve mean gradient measures 8.0 mmHg. Aortic  valve peak gradient measures 13.7 mmHg. Pulmonic Valve: The pulmonic valve was not assessed. Aorta: Aortic dilatation noted. There is mild dilatation of the aortic root, measuring 42 mm. Venous: The inferior vena cava is dilated in size with less than 50% respiratory variability, suggesting right atrial pressure of 15 mmHg. Additional Comments: Spectral Doppler performed. Color Doppler performed.  LEFT VENTRICLE PLAX 2D LVIDd:         4.30 cm   Diastology LVIDs:         3.00 cm   LV e' medial:    5.22 cm/s LV PW:         1.50 cm   LV E/e' medial:  16.2 LV IVS:        1.50 cm   LV e' lateral:   6.96 cm/s LVOT diam:     2.00 cm   LV E/e' lateral: 12.1 LVOT Area:     3.14 cm LV IVRT:       121 msec  RIGHT VENTRICLE RV S prime:     13.80 cm/s TAPSE (M-mode): 1.9 cm LEFT ATRIUM         Index LA diam:    4.40 cm 1.85 cm/m  AORTIC VALVE AV Vmax:      185.00 cm/s AV Vmean:     129.000 cm/s AV VTI:       0.296 m AV Peak Grad: 13.7 mmHg AV Mean Grad: 8.0 mmHg  AORTA Ao Root diam: 4.20 cm MITRAL VALVE MV Area (PHT): 3.53 cm    SHUNTS MV Decel Time: 215 msec    Systemic Diam: 2.00 cm MV E velocity: 84.40 cm/s MV A velocity: 87.00 cm/s MV E/A ratio:  0.97 [place] Electronically signed by Caron Poser Signature Date/Time: 03/16/2024/9:29:35 AM    Final       Medications:    anticoagulant sodium citrate      dexmedetomidine  (PRECEDEX ) IV infusion 0.5 mcg/kg/hr (03/18/24 0740)   heparin  Stopped (03/17/24 1749)   norepinephrine  (LEVOPHED ) Adult infusion 10 mcg/min (03/18/24 0740)    Chlorhexidine  Gluconate Cloth  6 each Topical Q0600   insulin  aspart  0-6 Units Subcutaneous Q4H   ipratropium-albuterol   3 mL Nebulization TID   multivitamin  1 tablet Per Tube QHS   nystatin   5 mL Oral QID   mouth rinse  15 mL Mouth Rinse 4 times per day   pantoprazole  (PROTONIX ) IV  40 mg Intravenous QHS   sertraline   25 mg Per Tube Daily   vancomycin  variable dose per unstable renal function (pharmacist dosing)   Does  not apply See admin instructions   alteplase , anticoagulant sodium citrate , docusate sodium , fentaNYL  (SUBLIMAZE ) injection, heparin , ipratropium-albuterol , mouth rinse, mouth rinse, mouth rinse, polyethylene glycol, sodium chloride  flush  Assessment/ Plan:  78 y.o. male with Obesity, HFpEF and obstructive sleep apnea/COPD requiring 2L supplemental oxygen , hyperlipidemia, chronic kidney disease stage IV, MGUS, hypertension  admitted on 03/08/2024 for Acute  and chronic respiratory failure with hypoxia [J96.21] Hypotension [I95.9] Acute on chronic respiratory failure with hypoxia and hypercapnia (HCC) [J96.21, J96.22]  Acute kidney injury on chronic kidney disease stage IV.  Baseline creatinine of 2.5-2.8. Acute kidney injury is thought to be secondary to pneumonia and circulatory shock causing ATN.  Patient responded well to IV Lasix  03/13/24.  However due to low CVP, further doses were held. CRRT stopped 11/22. First HD 11/23, tolerated Update:  Good urine output 1.4 L.  Creatinine currently 2.9.  Hold off on further dialysis for now.  Monitor renal function and Lab Results  Component Value Date   CREATININE 2.92 (H) 03/18/2024   CREATININE 2.21 (H) 03/17/2024   CREATININE 2.63 (H) 03/17/2024     Intake/Output Summary (Last 24 hours) at 03/18/2024 0815 Last data filed at 03/18/2024 0740 Gross per 24 hour  Intake 1809.99 ml  Output 770 ml  Net 1039.99 ml     Acute respiratory failure Respiratory pathogen's panel by PCR is negative.  Blood cultures from 03/06/2024 are negative. Continue to monitor respiratory status.   Anemia of CKD.  Hemoglobin currently 9.3.  No immediate need for ESAs but may need to consider adding.    LOS: 10 Marivel Mcclarty 11/24/20258:15 AM  [facility] [address] [place], [place] [phone]

## 2024-03-18 NOTE — Plan of Care (Signed)
  Problem: Clinical Measurements: Goal: Will remain free from infection Outcome: Progressing   Problem: Nutrition: Goal: Adequate nutrition will be maintained Outcome: Progressing   Problem: Elimination: Goal: Will not experience complications related to bowel motility Outcome: Progressing   Problem: Safety: Goal: Ability to remain free from injury will improve Outcome: Progressing   Problem: Skin Integrity: Goal: Risk for impaired skin integrity will decrease Outcome: Progressing

## 2024-03-18 NOTE — Progress Notes (Signed)
 Patient alert but confused throughout the shift. Patient is on 2 liters of oxygen . Tolerating diet with no complications. Male-pure-wick in place. Patient continues to have blood/pink tinged urine. Per night shift patient pulled out foley catheter. MD aware of blood tinged urine as well as sputum. Heparin  was stopped last night. Precedex  turned off this morning and levophed  titrated throughout the shift. Patients A-line positional this am. Per NP Gable keep for ABG's and use BP cuff for blood pressures. Daughter spoke with Dr. Isaiah this afternoon. Continue to assess.

## 2024-03-18 NOTE — IPAL (Signed)
  Interdisciplinary Goals of Care Family Meeting   Date carried out: 03/18/2024  Location of the meeting: Bedside  Member's involved: Physician and Family Member or next of kin    GOALS OF CARE DISCUSSION  The Clinical status was relayed to family in detail-Daughter and Son  Updated and notified of patients medical condition- Explained to family course of therapy and the modalities  Patient with Progressive multiorgan failure with a very high probablity of a very minimal chance of meaningful recovery despite all aggressive and optimal medical therapy.   Patient with cardiac and renal failure, still with shock.  PATIENT REMAINS FULL CODE  Family understands the situation.Time will only tell if and how well he recovers  Family are satisfied with Plan of action and management. All questions answered  Additional CC time 35 mins   Raymund Manrique Alm Cellar, M.D.  [facility]  Medical Director [facility] Medical Director [facility] Cardio-Pulmonary Department

## 2024-03-18 NOTE — Progress Notes (Signed)
 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 03/08/24 transferred to [facility] for initiation of CRRT. 03/09/24 seizure like episode, intubated. 03/15/24 extubated.   OT comments  Dave Brown was seen for OT treatment on this date. Upon arrival to room pt in bed, eager and agreeable to tx. Pt requires MIN A exit bed, CGA return to bed, tolerates sitting ~10 min with fair balance. MOD A + HHA sit<>stand, decreasing to +2 with side steps. MIN A + HHA sit<>stand with cues for hand placement. Pt making good progress toward goals, will continue to follow POC. Discharge recommendation remains appropriate.        If plan is discharge home, recommend the following:  A lot of help with walking and/or transfers;A lot of help with bathing/dressing/bathroom   Equipment Recommendations  BSC/3in1    Recommendations for Other Services      Precautions / Restrictions Precautions Precautions: Fall Recall of Precautions/Restrictions: Impaired Precaution/Restrictions Comments: a line Restrictions Weight Bearing Restrictions Per Provider Order: No       Mobility Bed Mobility Overal bed mobility: Needs Assistance Bed Mobility: Supine to Sit, Sit to Supine     Supine to sit: Min assist Sit to supine: Contact guard assist        Transfers Overall transfer level: Needs assistance Equipment used: 2 person hand held assist Transfers: Sit to/from Stand Sit to Stand: Min assist, +2 safety/equipment                 Balance Overall balance assessment: Needs  assistance Sitting-balance support: No upper extremity supported, Feet supported Sitting balance-Leahy Scale: Fair     Standing balance support: Bilateral upper extremity supported Standing balance-Leahy Scale: Poor                             ADL either performed or assessed with clinical judgement   ADL Overall ADL's : Needs assistance/impaired                                       General ADL Comments: MAX A don B socks sitting. MOD A + HHA for simualted BSC t/f, +2 safety     Communication Communication Communication: Impaired Factors Affecting Communication: Hearing impaired   Cognition Arousal: Alert Behavior During Therapy: WFL for tasks assessed/performed Cognition: No family/caregiver present to determine baseline, Difficult to assess Difficult to assess due to: Hard of hearing/deaf                             Following commands: Impaired Following commands impaired: Follows one step commands with increased time      Cueing   Cueing Techniques: Verbal cues, Tactile cues  Exercises      Shoulder Instructions       General Comments      Pertinent Vitals/ Pain       Pain Assessment Pain Assessment: Faces Faces Pain Scale: No hurt   Frequency  Min 2X/week        Progress Toward Goals  OT Goals(current goals can now be found in the care plan section)  Progress towards OT goals: Progressing toward goals  Acute Rehab OT Goals OT Goal Formulation: With patient Time For Goal Achievement: 03/31/24 Potential to Achieve Goals: Good ADL Goals Pt Will Perform Grooming: with supervision;standing Pt Will Perform Lower Body Dressing: with supervision;sit to/from stand Pt Will Transfer to Toilet: with modified independence;ambulating;regular height toilet  Plan      Co-evaluation    PT/OT/SLP Co-Evaluation/Treatment: Yes Reason for Co-Treatment: For patient/therapist safety;To address functional/ADL  transfers PT goals addressed during session: Mobility/safety with mobility OT goals addressed during session: ADL's and self-care      AM-PAC OT 6 Clicks Daily Activity     Outcome Measure   Help from another person eating meals?: None Help from another person taking care of personal grooming?: A Little Help from another person toileting, which includes using toliet, bedpan, or urinal?: A Lot Help from another person bathing (including washing, rinsing, drying)?: A Lot Help from another person to put on and taking off regular upper body clothing?: A Little Help from another person to put on and taking off regular lower body clothing?: A Lot 6 Click Score: 16    End of Session    OT Visit Diagnosis: Other abnormalities of gait and mobility (R26.89);Muscle weakness (generalized) (M62.81)   Activity Tolerance Patient tolerated treatment well   Patient Left in bed;with call bell/phone within reach;with bed alarm set   Nurse Communication Mobility status        Time: 9098-9076 OT Time Calculation (min): 22 min  Charges: OT General Charges $OT Visit: 1 Visit OT Treatments $Self Care/Home Management : 8-22 mins  Elston Slot, M.S. OTR/L  03/18/24, 10:50 AM  ascom [phone]

## 2024-03-18 NOTE — Progress Notes (Signed)
 Speech Language Pathology Treatment: Dysphagia  Patient Details Name: Albaro Deviney MRN: [id number] DOB: [birth year] Today's Date: 03/18/2024 Time: 8844-8759 SLP Time Calculation (min) (ACUTE ONLY): 45 min  Assessment / Plan / Recommendation Clinical Impression  Pt seen for ongoing assessment of swallowing and trials to upgrade diet consistency (foods) today. Pt awake, verbal and engaged w/ this SLP. Noted mild confusion and need for redirection during tasks intermittently- lengthy hospitalization now. Pt is HOH and not wearing his HAs which can impact follow through w/ tasks/questions. Pt responded well to verbal/tactile cues.  Noted frequent Belching during oral intake. OF NOTE: constant mild cough/throat clearing when moving about or Belching. He is coughing up Phlegm- blood tinged in color per NSG/noted.  He needed [facility] support for upright sitting in bed w/ head forward- bed positioned in a chair setting.  On [place] O2 support 2: afebrile. WBC wnl.    Pt appears to present w/ grossly functional oropharyngeal phase swallowing w/ the trial consistencies assessed today- trials of mech soft solids (moistened well) given as pt was wearing his Dentures. No immediate, overt oropharyngeal phase dysphagia noted; No overt neuromuscular deficits noted. Pt consumed po trials w/ No immediate, overt clinical s/s of aspiration w/ the po trials. Mild throat clearing followed Belching frequently. Belching prominent post trials/swallowing. Suspect potential GERD and/or Esophageal phase dysmotility underlying.  Pt appears at reduced risk for aspiration following general aspiration precautions w/ a modified diet consistency currently to initiation meals. However, pt does have challenging factors that could impact oropharyngeal swallowing to include lengthy illness/hospitalization; oral intubation; Belching w/ potential regurgitation; need for support to position upright for meals/po's; Pulmonary decline- Chronic. These  factors can increase risk for aspiration, dysphagia as well as decreased oral intake overall.    During po trials, pt consumed mech soft food trials and thin liquids w/ no immediate, overt coughing, decline in vocal quality, or change in respiratory presentation during/post trials. O2 sats 98%. Oral phase appeared [facility] w/ timely bolus management, mastication, and control of bolus propulsion for A-P transfer for swallowing. Oral clearing achieved w/ all trials.  Of Note: the same mild throat clearing/cough w/ Phlegm expectoration noted during po trials/intake as Prior to po's. NSG reported same stating he coughs when not eating/drinking also.  Pt fed self w/ MOD setup support and positioning support as pt tended to lie reclined.    Recommend a more mech soft consistency diet w/ well-moistened foods- Must wear Dentures for meals for effective mastication of solids. Thin liquids -- carefully monitor Cup sipping for small sips slowly, and pt should Hold Cup when drinking. No Straws. MUST sit fully upright for oral intake. General aspiration precautions. Tray setup and feeding Support as needed. Pills WHOLE vs Crushed in Puree for safer, easier swallowing -- encouraged now and for D/C to the pt.    Education given to pt on Pills in Puree; food consistencies and easy to eat options; general aspiration precautions. ST services will f/u while admitted for toleration of oral diet. Denture care. NSG updated, agreed. MD updated. Recommend Dietician f/u for support. Palliative Care consult for GOC and support/education. Precautions posted in room, chart.      HPI HPI: Pt is a 78 y.o. male with PMHx significant for HFpEF and COPD requiring 2L supplemental oxygen  who is transferred from [facility] where he admitted 11/12 with Acute on Chronic Hypoxic Respiratory Failure in the setting of Acute Decompensated HFpEF, circulatory shock, and AKI requiring initiation of CRRT.   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  On 11/15: seizure like episode, orally intubated in ICU.  Pt was extubated on 11/21.   CXR Imaging on 11/18: Similar small bilateral pleural effusions with bibasilar  opacities, favored to reflect atelectasis.  3. Pulmonary vascular congestion with possible mild interstitial  edema.      SLP Plan  Continue with current plan of care          Recommendations  Diet recommendations: Dysphagia 3 (mechanical soft);Thin liquid Liquids provided via: Cup;No straw Medication Administration: Whole meds with puree (vs need to Crush in puree) Supervision: Patient able to self feed;Staff to assist with self feeding;Intermittent supervision to cue for compensatory strategies Compensations: Minimize environmental distractions;Slow rate;Small sips/bites;Lingual sweep for clearance of pocketing;Multiple dry swallows after each bite/sip;Follow solids with liquid Postural Changes and/or Swallow Maneuvers: Out of bed for meals;Seated upright 90 degrees;Upright 30-60 min after meal (REFLUX precs.)                 (Dietician f/u; Palliative Care f/u) Oral care BID;Staff/trained caregiver to provide oral care (support w/ Denture care)   Intermittent Supervision/Assistance (for support) Dysphagia, unspecified (R13.10) (lengthy illness/hospitalization; oral intubation; Belching; need for support positioning upright for meals/po's; Pulmonary decline Chronic)     Continue with current plan of care       Comer Portugal, MS, CCC-SLP Speech Language Pathologist Rehab Services; [facility] [phone] (ascom) Mikenzie Mccannon  03/18/2024, 4:42 PM

## 2024-03-18 NOTE — Progress Notes (Signed)
 Rounding Note   Patient Name: Dave Brown Date of Encounter: 03/18/2024  [facility] Cardiologist: Newman JINNY Lawrence, MD   Subjective  He had hypotension over the weekend and was started on dobutamine  in addition to norepinephrine  drip.  Echocardiogram was obtained and showed hyperdynamic biventricular function without valvular disease or significant effusion.  No LVOT obstruction.   Dobutamine  was weaned off overnight and he is requiring 10 mics of norepinephrine  drip. He had confusion overnight and was on Precedex  but he is off sedation now.  He wants to go home.  He reports some chest discomfort and soreness.  Scheduled Meds:  Chlorhexidine  Gluconate Cloth  6 each Topical Q0600   insulin  aspart  0-6 Units Subcutaneous Q4H   ipratropium-albuterol   3 mL Nebulization TID   multivitamin  1 tablet Oral QHS   nystatin   5 mL Oral QID   mouth rinse  15 mL Mouth Rinse 4 times per day   pantoprazole  (PROTONIX ) IV  40 mg Intravenous QHS   Ensure Max Protein  11 oz Oral TID   sertraline   25 mg Per Tube Daily   vancomycin  variable dose per unstable renal function (pharmacist dosing)   Does not apply See admin instructions   Continuous Infusions:  anticoagulant sodium citrate      dexmedetomidine  (PRECEDEX ) IV infusion Stopped (03/18/24 9176)   heparin  Stopped (03/17/24 1749)   norepinephrine  (LEVOPHED ) Adult infusion 9 mcg/min (03/18/24 1131)   PRN Meds: alteplase , anticoagulant sodium citrate , docusate sodium , fentaNYL  (SUBLIMAZE ) injection, heparin , ipratropium-albuterol , mouth rinse, mouth rinse, mouth rinse, polyethylene glycol, sodium chloride  flush   Vital Signs  Vitals:   03/18/24 1345 03/18/24 1415 03/18/24 1416 03/18/24 1430  BP: (!) 118/56 (!) 101/58 (!) 101/58 112/63  Pulse:  99 99 99  Resp: (!) 26 (!) 29 (!) 21 (!) 24  Temp:      TempSrc:      SpO2:   94% 95%  Weight:      Height:        Intake/Output Summary (Last 24 hours) at 03/18/2024 1501 Last  data filed at 03/18/2024 1155 Gross per 24 hour  Intake 515.26 ml  Output 1225 ml  Net -709.74 ml      03/18/2024    5:00 AM 03/17/2024   10:20 AM 03/17/2024    8:01 AM  Last 3 Weights  Weight (lbs) 280 lb 3.3 oz 282 lb 13.6 oz 281 lb 12 oz  Weight (kg) 127.1 kg 128.3 kg 127.8 kg      Telemetry Infrequent PVCs, sinus rhythm  ECG   - Personally Reviewed; sinus rhythm with first-degree AV block and prolonged QT  Physical Exam  GEN: Sitting up, no distress Neck: Unable to estimate JVD given habitus Cardiac: RRR, no murmurs, rubs, or gallops.  Respiratory: Clear to auscultation bilaterally. GI: Soft,  non-distended  MS: No edema; No deformity. Neuro:  Nonfocal  Psych: Normal affect   Labs High Sensitivity Troponin:  No results for input(s): TROPONINIHS in the last 720 hours.   Chemistry Recent Labs  Lab 03/12/24 0412 03/12/24 1605 03/14/24 0324 03/14/24 1611 03/15/24 0135 03/15/24 0509 03/16/24 0414 03/17/24 0503 03/17/24 1407 03/18/24 0520  NA 133*   < > 135   < > 134*   < > 136 137 135 136  K 4.1   < > 4.1   < > 4.3   < > 4.1 3.9 3.8 4.2  CL 101   < > 101   < > 101   < >  103 103 102 103  CO2 25   < > 25   < > 24   < > 23 23 24 23   GLUCOSE 106*   < > 145*   < > 145*   < > 122* 117* 126* 115*  BUN 12   < > 14   < > 13   < > 22 29* 23 31*  CREATININE 1.52*   < > 1.32*   < > 1.29*   < > 2.13* 2.63* 2.21* 2.92*  CALCIUM  8.4*   < > 8.7*   < > 8.9   < > 9.6 9.6 9.4 9.6  MG 2.3   < > 2.3  --  2.4  --  2.4  --   --   --   PROT 5.8*  --   --   --   --   --   --   --   --   --   ALBUMIN 3.1*  3.0*   < > 3.1*   < >  --    < > 3.1* 2.9* 3.0* 3.2*  AST 14*  --   --   --   --   --   --   --   --   --   ALT 12  --   --   --   --   --   --   --   --   --   ALKPHOS 80  --   --   --   --   --   --   --   --   --   BILITOT 0.5  --   --   --   --   --   --   --   --   --   GFRNONAA 47*   < > 55*   < > 57*   < > 31* 24* 30* 21*  ANIONGAP 7   < > 10   < > 10   < > 11 11 9 11     < > = values in this interval not displayed.    Lipids  Recent Labs  Lab 03/16/24 0414  TRIG 155*    Hematology Recent Labs  Lab 03/17/24 0503 03/17/24 1407 03/18/24 0520  WBC 8.7 7.9 10.5  RBC 3.08* 3.24* 2.95*  HGB 10.0* 10.3* 9.3*  HCT 30.6* 32.4* 29.5*  MCV 99.4 100.0 100.0  MCH 32.5 31.8 31.5  MCHC 32.7 31.8 31.5  RDW 14.2 14.0 14.1  PLT 106* 115* 144*   Thyroid  No results for input(s): TSH, FREET4 in the last 168 hours.   BNPNo results for input(s): BNP, PROBNP in the last 168 hours.  DDimer No results for input(s): DDIMER in the last 168 hours.   Radiology  No results found.   Cardiac Studies -TTE 03/07/2024 LVEF 60 to 65% with severe LVH, grade 3 diastolic dysfunction, no significant valvular disease -CT chest 02/2024 with severe aortic atherosclerosis and severe three-vessel coronary artery calcifications - TTE 01/2024 LVEF 60 to 65%, severe LVH, grade 3 diastolic dysfunction  Assessment & Plan  Acute on chronic respiratory failure HFpEF Undifferentiated shock Unclear cause.  His echo showed normal biventricular function but he does have severe left ventricular hypertrophy.  No evidence of LVOT gradient. Continue supportive care.  If he continues to require norepinephrine , consider right heart catheterization to better understand his hemodynamics.    Acute on chronic renal failure -HD and volume management as above -  Nephrology is holding off on dialysis for now to see if there is any renal recovery and return of kidney function to his baseline.   Acute on chronic diastolic CHF Normal EF with severe LVH.  Hold GDMT for now given shock.  Consider amyloid/HCM/restrictive CM evaluation as outpatient   Elevated troponin Coronary artery calcifications Aortic atherosclerosis Up to 353, likely supply/demand mismatch in the setting of respiratory failure - No plans for ischemic evaluation given chronic renal failure.  He likely has underlying  CAD given his severe three-vessel CAC, but options are limited.   Paroxysmal atrial fibrillation AIVR NSVT Currently in sinus rhythm.  Continue heparin  anticoagulation.   For questions or updates, please contact [facility] Please consult [email or web address] for contact info under     Signed, Deatrice Cage, MD  03/18/2024, 3:01 PM

## 2024-03-18 NOTE — TOC Progression Note (Signed)
 Transition of Care [facility]) - Progression Note    Patient Details  Name: Dave Brown MRN: [id number] Date of Birth: 07-08-1945  Transition of Care [facility]) CM/SW Contact  K'La JINNY Ruts, LCSW Phone Number: 03/18/2024, 12:06 PM  Clinical Narrative:    Chart reviewed. The patient is disoriented. I was able to speak with the daughter about SNF recommendation. The patient daughter declined SNF recommendation for the patient but was accepting of [facility] agencies. The patient daughter consented to sending [facility] referrals in the HUB. SW will leave list of HH agencies in patient room for daughter, son, and patient to look over.   The daughter also reports that the patient has a oxygen  tank at home. The patient daughter was wondering if the patient will D/C with oxygen . SW redirected the patient to patient provider.   The patient daughter had no other questions during the time of the consult.    Expected Discharge Plan: Home w Home Health Services Barriers to Discharge: Continued Medical Work up               Expected Discharge Plan and Services   Discharge Planning Services: CM Consult   Living arrangements for the past 2 months: Single Family Home                                       Social Drivers of Health (SDOH) Interventions SDOH Screenings   Food Insecurity: No Food Insecurity (03/07/2024)  Housing: Low Risk  (03/07/2024)  Transportation Needs: No Transportation Needs (03/07/2024)  Utilities: Not At Risk (03/07/2024)  Social Connections: Unknown (03/07/2024)  Tobacco Use: Medium Risk (03/06/2024)    Readmission Risk Interventions    03/18/2024   12:04 PM  Readmission Risk Prevention Plan  Transportation Screening Complete  PCP or Specialist Appt within 3-5 Days Complete  HRI or Home Care Consult Complete  Social Work Consult for Recovery Care Planning/Counseling Complete  Palliative Care Screening Not Applicable  Medication Review [employer]) Complete

## 2024-03-18 NOTE — Progress Notes (Signed)
 Nutrition Follow-up  DOCUMENTATION CODES:   Obesity unspecified  INTERVENTION:   Ensure Max protein supplement po TID, each supplement provides 150kcal and 30g of protein.  Rena-vit po daily   Pt remains at refeed risk; recommend monitor potassium, magnesium and phosphorus labs daily until stable  Daily weights   Copper , zinc , B6, B1 and vitamin C levels pending.   NUTRITION DIAGNOSIS:   Increased nutrient needs related to acute illness (CRRT) as evidenced by estimated needs. 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  Met with pt in room today. Pt is HOH. Pt reports fairly good appetite and oral intake in hospital. RN reports that pt ate ~50% of his breakfast this morning. RD discussed with pt the importance of adequate nutrition needed to preserve lean muscle. Pt is agreeable to strawberry Ensure. RD will add supplements and vitamins to help pt meet his estimated needs. CRRT stopped 11/22; pt s/p HD yesterday. Per chart, pt is down ~37lbs since admission but appears to be around his UBW currently. Vitamin labs pending.   Medications reviewed and include: insulin , rena-vit, protonix , levophed   Labs reviewed: K 4.2 wnl, BUN 31(H), creat 2.92(H), P 4.0 wnl Folate 8.0 wnl, B12 276 wnl- 11/22 Hgb 9.3(L), Hct 29.5(L) Cbgs- 117, 153, 113 x 24 hrs   UOP-   Nutrition Focused Physical Exam:  Flowsheet Row Most Recent Value  Orbital Region No depletion  Upper Arm Region No depletion  Thoracic and Lumbar Region No depletion  Buccal Region No depletion  Temple Region No depletion  Clavicle Bone Region No depletion  Clavicle and Acromion Bone Region  No depletion  Scapular Bone Region No depletion  Dorsal Hand No depletion  Patellar Region No depletion  Anterior Thigh Region No depletion  Posterior Calf Region No depletion  Edema (RD Assessment) Mild  Hair Reviewed  Eyes Reviewed  Mouth Reviewed  Skin Reviewed  Nails Reviewed   Diet Order:   Diet Order             DIET - DYS 1 Room service appropriate? No; Fluid consistency: Thin  Diet effective now                  EDUCATION NEEDS:   Not appropriate for education at this time  Skin:  Skin Assessment: Reviewed RN Assessment (blister L buttocks)  Last BM:  11/24- type 6  Height:   Ht Readings from Last 1 Encounters:  03/16/24 5' 9.02 (1.753 m)    Weight:   Wt Readings from Last 1 Encounters:  03/18/24 127.1 kg    Ideal Body Weight:  72.7 kg  BMI:  Body mass index is 41.36 kg/m.  Estimated Nutritional Needs:   Kcal:  2700-3000kcal/day  Protein:  >135g/day  Fluid:  1.8-2.1L/day  Augustin Shams MS, RD, LDN If unable to be reached, please send secure chat to RD inpatient available from 8:00a-4:00p daily

## 2024-03-18 NOTE — Evaluation (Signed)
 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 03/08/24 transferred to [facility] for initiation of CRRT. 03/09/24 seizure like episode, intubated. 03/15/24 extubated.  Clinical Impression  Patient is agreeable to PT evaluation. He reports he is independent at baseline, lives with his son, and has DME in place in the home already.  Today the patient required assistance with bed mobility and transfers. Three standing bouts performed with + 2 person assistance. Pre-gait activity performed in standing with generalized weakness noted. Vitals stable with activity. Recommend to continue PT to maximize independence and facilitate return to prior level of function. Rehabilitation < 3 hours/day recommended after this hospital stay.     If plan is discharge home, recommend the following: A lot of help with walking and/or transfers;A lot of help with bathing/dressing/bathroom;Assist for transportation;Supervision due to cognitive status;Help with stairs or ramp for entrance;Assistance with cooking/housework   Can travel by private vehicle   No    Equipment Recommendations None recommended by PT  Recommendations for Other Services       Functional Status Assessment Patient has had a recent decline in their functional status and demonstrates the ability to make significant improvements in function in a reasonable and predictable amount of time.     Precautions / Restrictions Precautions Precautions: Fall Recall of Precautions/Restrictions: Impaired Precaution/Restrictions Comments: a  line Restrictions Weight Bearing Restrictions Per Provider Order: No      Mobility  Bed Mobility Overal bed mobility: Needs Assistance Bed Mobility: Supine to Sit, Sit to Supine     Supine to sit: Min assist Sit to supine: Contact guard assist   General bed mobility comments: increased time and effort required with all mobility. cues for technique    Transfers Overall transfer level: Needs assistance Equipment used: 2 person hand held assist Transfers: Sit to/from Stand Sit to Stand: Min assist, +2 safety/equipment           General transfer comment: cues for technique, anterior weight shifting. lifting assistance required for standing. 3 standing bouts performed    Ambulation/Gait             Pre-gait activities: side step to the left, weight shifting faciliation. patient required Min A +2 person with occasional knee buckling noted. vitals stable with standing activity General Gait Details: walking not attempted due to poor activity tolerance, generalized wekaness, ICU lines  Stairs            Wheelchair Mobility     Tilt Bed    Modified Rankin (Stroke Patients Only)       Balance Overall balance assessment: Needs assistance Sitting-balance support: No upper extremity supported, Feet supported Sitting balance-Leahy Scale: Fair     Standing balance support: Bilateral upper extremity supported Standing balance-Leahy Scale: Poor Standing balance comment: external support required                             Pertinent Vitals/Pain Pain Assessment Pain Assessment: No/denies pain    Home Living Family/patient expects to be discharged to:: Private residence Living Arrangements: Children Available Help at Discharge:  Family;Available PRN/intermittently Type of Home: House Home Access: Ramped entrance       Home Layout: One level Home Equipment: Agricultural Consultant (2 wheels);Rollator (4 wheels);Wheelchair - manual;Cane - single  point;Lift chair;Adaptive equipment;Hand held shower head;Shower seat - built in      Prior Function Prior Level of Function : Independent/Modified Independent             Mobility Comments: independent and driving per patient report       Extremity/Trunk Assessment   Upper Extremity Assessment Upper Extremity Assessment: Generalized weakness    Lower Extremity Assessment Lower Extremity Assessment: Generalized weakness       Communication   Communication Communication: Impaired Factors Affecting Communication: Hearing impaired    Cognition Arousal: Alert Behavior During Therapy: WFL for tasks assessed/performed   PT - Cognitive impairments: Difficult to assess, Safety/Judgement Difficult to assess due to: Hard of hearing/deaf                     PT - Cognition Comments: very HOH even with hearing aides in place. safety cues required with mobility. Following commands: Impaired Following commands impaired: Follows one step commands with increased time     Cueing Cueing Techniques: Verbal cues, Tactile cues     General Comments      Exercises     Assessment/Plan    PT Assessment Patient needs continued PT services  PT Problem List Decreased strength;Decreased range of motion;Decreased activity tolerance;Decreased balance;Decreased mobility;Decreased cognition;Decreased knowledge of use of DME;Decreased safety awareness;Decreased knowledge of precautions;Cardiopulmonary status limiting activity       PT Treatment Interventions Gait training;DME instruction;Stair training;Functional mobility training;Therapeutic activities;Therapeutic exercise;Balance training;Neuromuscular re-education;Cognitive remediation;Patient/family education    PT Goals (Current goals can be found in the Care Plan section)  Acute Rehab PT Goals Patient Stated Goal: to get better PT Goal Formulation: With patient Time For Goal Achievement: 04/01/24 Potential to Achieve  Goals: Good    Frequency Min 2X/week     Co-evaluation   Reason for Co-Treatment: For patient/therapist safety;To address functional/ADL transfers PT goals addressed during session: Mobility/safety with mobility OT goals addressed during session: ADL's and self-care       AM-PAC PT 6 Clicks Mobility  Outcome Measure Help needed turning from your back to your side while in a flat bed without using bedrails?: A Lot Help needed moving from lying on your back to sitting on the side of a flat bed without using bedrails?: A Lot Help needed moving to and from a bed to a chair (including a wheelchair)?: A Lot Help needed standing up from a chair using your arms (e.g., wheelchair or bedside chair)?: A Lot Help needed to walk in hospital room?: Total Help needed climbing 3-5 steps with a railing? : Total 6 Click Score: 10    End of Session   Activity Tolerance: Patient tolerated treatment well;Patient limited by fatigue Patient left: with call bell/phone within reach;in bed;with nursing/sitter in room;with SCD's reapplied (semi-chair position) Nurse Communication: Mobility status PT Visit Diagnosis: Muscle weakness (generalized) (M62.81);Unsteadiness on feet (R26.81)    Time: 9099-9075 PT Time Calculation (min) (ACUTE ONLY): 24 min   Charges:   PT Evaluation $PT Eval Moderate Complexity: 1 Mod   PT General Charges $$ ACUTE PT VISIT: 1 Visit         [name], PT, MPT  [name] 03/18/2024, 11:27 AM

## 2024-03-18 NOTE — Progress Notes (Signed)
 NAME:  Dave Brown, MRN:  [id number], DOB:  06-May-1945, LOS: 10 ADMISSION DATE:  03/08/2024, CONSULTATION DATE:  03/08/2024  Brief Pt Description / Synopsis:  78 y.o. male admitted with Acute on Chronic HFpEF, Cardiogenic/Septic shock, AKI, Acute Hypoxic Respiratory Failure, and MRSA Pneumonia requiring mechanical ventilation and initiation of CRRT.   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 He denied any fevers, chills, chest pain.     Of note, the patient was recently admitted to the hospital from 02/01/2024 to 02/05/2024 for respiratory failure due to CHF.  The patient did not require oxygen  at the time of his discharge.  His discharge weight was 286.  At his last office visit with his primary provider his weight was 291 pounds on 02/22/24.  Follow up with pulmonary on 03/04/24 office weight 29.   He was discharged home with furosemide  40 mg daily during last hospitalization   ED Course: Initial Vital Signs: afebrile and hemodynamically stable. Oxygen  saturation was 93-97% on 4 L  Significant Labs: WBC 11.0, hemoglobin 12.3, platelets 138. Sodium 141, potassium 4.1, bicarbonate 31, serum creatinine 4.15. proBNP 16,139. PCT 0.13. Lactic acid 1.9 >> 0.8. COVID-negative  Imaging Chest X-ray>>patchy opacities right lower lobe and left lower lobe  Medications Administered: furosemide  40 mg IV, Solu-Medrol  125 mg, ceftriaxone , and azithromycin . He was given albuterol  and Atrovent .    [facility] asked to admit for further workup and treatment.  He gradually became hypotensive requiring initiation of low dose Levophed  (3 to 5 mcg), PCCM was consulted.  Nephrology was consulted for oliguria. Case was  discussed with nephrology and it was felt the patient need to be initiated on CRRT. Subsequently, initial request was made to transfer the patient to [facility]. In order to expedite care, he is being transferred to [facility] regional ICU to critical care service where nephrology will be consulted for continued management of his renal failure and fluid overload.   Please see Significant Hospital Events section below for full detailed hospital course.   Pertinent  Medical History   Past Medical History:  Diagnosis Date   CHF (congestive heart failure) (HCC)    Degenerative joint disease    High cholesterol    Hypertension    Kidney stones    Obesity    Respiratory failure with hypoxia (HCC)    Sleep apnea     Micro Data:  11/12: Blood cultures x2>> no growth 11/13: MRSA PCR>> negative 11/13: RVP>> negative 11/24: MRSA PCR>> negative 11/18: Tracheal Aspirate>> MRSA 11/19: MRSA PCR +  Antimicrobials:   Anti-infectives (From admission, onward)    Start     Dose/Rate Route Frequency Ordered Stop   03/17/24 1300  vancomycin  (VANCOCIN ) IVPB 1000 mg/200 mL premix        1,000 mg 200 mL/hr over 60 Minutes Intravenous  Once 03/17/24 1147 03/17/24 1420   03/16/24 1200  vancomycin  (VANCOCIN ) IVPB 1000 mg/200 mL premix        1,000 mg 200 mL/hr over 60 Minutes Intravenous  Once 03/16/24 1104 03/16/24 2100   03/15/24 2200  piperacillin -tazobactam (ZOSYN ) IVPB 3.375 g        3.375 g 12.5 mL/hr over 240 Minutes Intravenous Every 8 hours  03/15/24 1412 03/17/24 0128   03/15/24 1600  vancomycin  (VANCOREADY) IVPB 1500 mg/300 mL        1,500 mg 150 mL/hr over 120 Minutes Intravenous  Once 03/15/24 1411 03/15/24 1705   03/15/24 1411  vancomycin  variable dose per unstable renal function (pharmacist dosing)         Does not apply See admin instructions 03/15/24 1411     03/15/24 1400  vancomycin  (VANCOREADY) IVPB 1500 mg/300 mL  Status:  Discontinued        1,500 mg 150 mL/hr over 120  Minutes Intravenous Every 24 hours 03/14/24 1139 03/15/24 1411   03/14/24 1200  vancomycin  (VANCOREADY) IVPB 2000 mg/400 mL        2,000 mg 200 mL/hr over 120 Minutes Intravenous  Once 03/14/24 1012 03/14/24 1405   03/12/24 1200  piperacillin -tazobactam (ZOSYN ) IVPB 3.375 g  Status:  Discontinued        3.375 g 100 mL/hr over 30 Minutes Intravenous Every 6 hours 03/12/24 1013 03/15/24 1412        Significant Hospital Events: Including procedures, antibiotic start and stop dates in addition to other pertinent events   11/12: Admitted to [facility] to [facility] for treatment of Acute Decompensated HFpEF and AKI. 11/13: BP decreased, started on Levophed .  PCCM consulted.  ABX discontinued. 11/14: Transfer to [facility] for initiation of CRRT.  11/15: seizure like episode, intubated 11/16: Unable to wean; PICC line placed 11/21: Extubated to Bipap. Added Dobutamine .  11/23: Patient remains on bipap this AM. WBC stable at 7.6. H&H Stable. Coox SvO2 67%.  Electrolytes within normal range. NE decreased to 4. Remains on Dobutamin at 5.  11/24: Off BiPAP, awake and alert but remains encephalopathic.  Afebrile, Remains on Levophed , currently 10 mcg, weaning as tolerated.  Creatinine increased to 2.9, but UOP 1.4 L (net - 7.8L), Nephrology holding off on HD today.  Interim History / Subjective:  As outlined above under Significant Hospital Events section  Objective   Blood pressure (!) 114/97, pulse 74, temperature 98.1 F (36.7 C), resp. rate (!) 21, height 5' 9.02 (1.753 m), weight 127.1 kg, SpO2 100%. CVP:  [10 mmHg] 10 mmHg  FiO2 (%):  [28 %] 28 %   Intake/Output Summary (Last 24 hours) at 03/18/2024 9191 Last data filed at 03/18/2024 0740 Gross per 24 hour  Intake 1809.99 ml  Output 770 ml  Net 1039.99 ml   Filed Weights   03/17/24 0801 03/17/24 1020 03/18/24 0500  Weight: 127.8 kg 128.3 kg 127.1 kg    Examination: General: Acute on chronically ill appearing obese male, laying in bed,  on [place], in NAD HENT: Atramatic, normocephalic, neck supple, difficult to assess JVD due to body habitus Lungs: Coarse breath sounds throughout, even, nonlabored Cardiovascular: RRR, s1s2, no M/R/G Abdomen: Obese, soft, nontender, nondistended, no guarding or rebound tenderness, BS+ x4 Extremities: Generalized weakness, normal bulk and tone, warm, trace edema Neuro: Awake and alert, oriented only to self, moves all extremities purposefully, no focal deficits noted GU: external male catheter in place  Resolved Hospital Problem list     Assessment & Plan:   #Acute on Chronic HFpEF #Shock: Cardiogenic +/- Septic  #Paroxsymal A. Fib -Echocardiogram 03/16/24: LVEF >75%, grade I DD, RV systolic function normal, RV size normal -Continuous cardiac monitoring -Maintain MAP >65 -Vasopressors as needed to maintain MAP goal -Follow Coox -Diuresis as BP and renal function permits -Cardiology following, appreciate input ~ plan for cardia cath at some point -Holding Heparin  gtt due to  mild hemoptysis/hematuria   #Acute Hypoxic Respiratory Failure due to ... #Pulmonary Edema #MRSA Pneumonia Intubated 11/15; Extubated 11/21 -Supplemental O2 as needed to maintain O2 sats >92% -BiPAP, wean as tolerated -Follow intermittent Chest X-ray & ABG as needed -Bronchodilators & Pulmicort  nebs -ABX as above  -Diuresis as BP and renal function permits/Volume removal with HD -Pulmonary toilet as able  #MRSA Pneumonia -Monitor fever curve -Trend WBC's & Procalcitonin -Follow cultures as above -Continue empiric Vancomycin  pending cultures & sensitivities (plan for 10 day course)  #AKI on CKD -Monitor I&O's / urinary output -Follow BMP -Ensure adequate renal perfusion -Avoid nephrotoxic agents as able -Replace electrolytes as indicated ~ Pharmacy following for assistance with electrolyte replacement -Nephrology following, appreciate input ~ off CRRT, intermittent HD as per Nephrology   #Diabetes  Mellitus Type II -CBG's q4h; Target range of 140 to 180 -SSI -Follow ICU Hypo/Hyperglycemia protocol  #Acute Metabolic Encephalopathy CT Head negative, EEG normal -Treatment of metabolic derangements as outlined above -Provide supportive care -Promote normal sleep/wake cycle and family presence -Avoid sedating medications as able -Precedex  as needed ~ weaned off 11/24      Best Practice (right click and Reselect all SmartList Selections daily)   Diet/type: Regular consistency (see orders) DVT prophylaxis: SCD GI prophylaxis: PPI Lines: Central line, Dialysis Catheter, and yes and it is still needed Foley:  N/A Code Status:  full code Last date of multidisciplinary goals of care discussion [11/24]  11/24: Will update pt's family when they arrive at bedside on plan of care.  Labs   CBC: Recent Labs  Lab 03/12/24 0412 03/13/24 0346 03/15/24 0135 03/16/24 0414 03/17/24 0503 03/17/24 1407 03/18/24 0520  WBC 14.7*  15.0*   < > 12.2* 7.6 8.7 7.9 10.5  NEUTROABS 13.2*  --   --   --   --   --   --   HGB 11.2*  11.2*   < > 11.2* 10.6* 10.0* 10.3* 9.3*  HCT 35.3*  35.3*   < > 35.7* 33.2* 30.6* 32.4* 29.5*  MCV 102.0*  101.7*   < > 102.0* 101.2* 99.4 100.0 100.0  PLT 76*  75*   < > 116* 106* 106* 115* 144*   < > = values in this interval not displayed.    Basic Metabolic Panel: Recent Labs  Lab 03/12/24 0412 03/12/24 1605 03/13/24 0346 03/13/24 1650 03/14/24 0324 03/14/24 1611 03/15/24 0135 03/15/24 0509 03/15/24 1607 03/16/24 0414 03/17/24 0503 03/17/24 1407 03/18/24 0520  NA 133*   < > 135   < > 135   < > 134*   < > 130* 136 137 135 136  K 4.1   < > 4.0   < > 4.1   < > 4.3   < > 4.3 4.1 3.9 3.8 4.2  CL 101   < > 100   < > 101   < > 101   < > 98 103 103 102 103  CO2 25   < > 26   < > 25   < > 24   < > 25 23 23 24 23   GLUCOSE 106*   < > 147*   < > 145*   < > 145*   < > 255* 122* 117* 126* 115*  BUN 12   < > 14   < > 14   < > 13   < > 16 22 29* 23 31*   CREATININE 1.52*   < > 1.27*   < > 1.32*   < >  1.29*   < > 1.53* 2.13* 2.63* 2.21* 2.92*  CALCIUM  8.4*   < > 8.8*   < > 8.7*   < > 8.9   < > 9.0 9.6 9.6 9.4 9.6  MG 2.3  --  2.3  --  2.3  --  2.4  --   --  2.4  --   --   --   PHOS 2.5   < > 2.7   < > 2.9   < >  --    < > 2.3* 2.7 3.8 3.0 4.0   < > = values in this interval not displayed.   GFR: Estimated Creatinine Clearance: 27.5 mL/min (A) (by C-G formula based on SCr of 2.92 mg/dL (H)). Recent Labs  Lab 03/15/24 0135 03/16/24 0414 03/17/24 0503 03/17/24 1407 03/18/24 0520  WBC 12.2* 7.6 8.7 7.9 10.5  LATICACIDVEN 0.7  --   --   --   --     Liver Function Tests: Recent Labs  Lab 03/12/24 0412 03/12/24 1605 03/15/24 1607 03/16/24 0414 03/17/24 0503 03/17/24 1407 03/18/24 0520  AST 14*  --   --   --   --   --   --   ALT 12  --   --   --   --   --   --   ALKPHOS 80  --   --   --   --   --   --   BILITOT 0.5  --   --   --   --   --   --   PROT 5.8*  --   --   --   --   --   --   ALBUMIN 3.1*  3.0*   < > 2.9* 3.1* 2.9* 3.0* 3.2*   < > = values in this interval not displayed.   No results for input(s): LIPASE, AMYLASE in the last 168 hours. No results for input(s): AMMONIA in the last 168 hours.  ABG    Component Value Date/Time   PHART 7.41 03/16/2024 1412   PCO2ART 41 03/16/2024 1412   PO2ART 167 (H) 03/16/2024 1412   HCO3 26.0 03/16/2024 1412   ACIDBASEDEF 0.8 03/16/2024 1153   O2SAT 60 03/17/2024 1203     Coagulation Profile: No results for input(s): INR, PROTIME in the last 168 hours.  Cardiac Enzymes: No results for input(s): CKTOTAL, CKMB, CKMBINDEX, TROPONINI in the last 168 hours.  HbA1C: Hgb A1c MFr Bld  Date/Time Value Ref Range Status  03/08/2024 03:11 AM 5.2 4.8 - 5.6 % Final    Comment:    (NOTE) Diagnosis of Diabetes The following HbA1c ranges recommended by the American Diabetes Association (ADA) may be used as an aid in the diagnosis of diabetes  mellitus.  Hemoglobin             Suggested A1C NGSP%              Diagnosis  <5.7                   Non Diabetic  5.7-6.4                Pre-Diabetic  >6.4                   Diabetic  <7.0                   Glycemic control for  adults with diabetes.    05/02/2020 02:37 PM 6.9 (H) 4.8 - 5.6 % Final    Comment:    (NOTE) Pre diabetes:          5.7%-6.4%  Diabetes:              >6.4%  Glycemic control for   <7.0% adults with diabetes     CBG: Recent Labs  Lab 03/17/24 1650 03/17/24 1945 03/17/24 2336 03/18/24 0515 03/18/24 0732  GLUCAP 91 108* 85 113* 153*    Review of Systems:   Unable to assess due to AMS   Past Medical History:  He,  has a past medical history of CHF (congestive heart failure) (HCC), Degenerative joint disease, High cholesterol, Hypertension, Kidney stones, Obesity, Respiratory failure with hypoxia (HCC), and Sleep apnea.   Surgical History:  No past surgical history on file.   Social History:   reports that he quit smoking about 25 years ago. His smoking use included cigars. He does not have any smokeless tobacco history on file. He reports that he does not drink alcohol and does not use drugs.   Family History:  His family history is not on file.   Allergies No Known Allergies   Home Medications  Prior to Admission medications   Medication Sig Start Date End Date Taking? Authorizing Provider  albuterol  (PROVENTIL ) (2.5 MG/3ML) 0.083% nebulizer solution Take 2.5 mg by nebulization every 6 (six) hours as needed for shortness of breath or wheezing. 03/04/24  Yes [provider]  hydrocortisone  sodium succinate  (SOLU-CORTEF ) 100 MG injection Inject 2 mLs (100 mg total) into the vein every 8 (eight) hours. 03/08/24  Yes Tat, Alm, MD  midodrine  (PROAMATINE ) 10 MG tablet Take 1 tablet (10 mg total) by mouth 3 (three) times daily with meals. 03/08/24  Yes Tat, Alm, MD  norepinephrine  (LEVOPHED ) 4-5  MG/250ML-% SOLN Inject 0-10 mcg/min into the vein continuous. 03/08/24  Yes Tat, Alm, MD  rosuvastatin  (CRESTOR ) 10 MG tablet Take 10 mg by mouth daily.   Yes [provider]  sertraline  (ZOLOFT ) 25 MG tablet Take 25 mg by mouth in the morning.   Yes [provider]  TYLENOL  325 MG tablet Take 325-650 mg by mouth every 8 (eight) hours as needed for mild pain (pain score 1-3) (or headaches).   Yes [provider]     Critical care time: 40 minutes     Inge Lecher, AGACNP-BC [facility] Prefer epic messenger for cross cover needs If after hours, please call E-link

## 2024-03-18 NOTE — Consult Note (Signed)
 PHARMACY CONSULT NOTE - ELECTROLYTES  Pharmacy Consult for Electrolyte Monitoring and Replacement   Recent Labs: Height: 5' 9.02 (175.3 cm) Weight: 127.1 kg (280 lb 3.3 oz) IBW/kg (Calculated) : 70.74 Estimated Creatinine Clearance: 27.5 mL/min (A) (by C-G formula based on SCr of 2.92 mg/dL (H)). Potassium  Date Value  03/18/2024 4.2 mmol/L  07/18/2013 3.4 mEq/L (L)   Magnesium (mg/dL)  Date Value  88/77/7974 2.4   Calcium  (mg/dL)  Date Value  88/75/7974 9.6  07/18/2013 10.1   Albumin (g/dL)  Date Value  88/75/7974 3.2 (L)  07/18/2013 3.8   Phosphorus (mg/dL)  Date Value  88/75/7974 4.0   Sodium  Date Value  03/18/2024 136 mmol/L  07/18/2013 138 mEq/L   Assessment  Dave Brown is a 78 y.o. male presenting with AKI requiring CRRT. PMH significant for HFpEF and COPD requiring 2L supplemental oxygen . Pharmacy has been consulted to monitor and replace electrolytes.  Goal of Therapy: Electrolytes WNL  Plan:  No electrolyte replacement warranted for today. Scr continues to trend up Check BMP tomorrow  Thank you for allowing pharmacy to be a part of this patient's care.  Ashyra Cantin A Kamyra Schroeck 03/18/2024 7:42 AM

## 2024-03-19 ENCOUNTER — Inpatient Hospital Stay

## 2024-03-19 ENCOUNTER — Other Ambulatory Visit: Payer: Self-pay

## 2024-03-19 ENCOUNTER — Encounter: Payer: Self-pay | Admitting: Student in an Organized Health Care Education/Training Program

## 2024-03-19 DIAGNOSIS — I5031 Acute diastolic (congestive) heart failure: Secondary | ICD-10-CM

## 2024-03-19 LAB — VITAMIN C: Vitamin C: 0.2 mg/dL — ABNORMAL LOW (ref 0.4–2.0)

## 2024-03-19 LAB — CBC
HCT: 28.5 % — ABNORMAL LOW (ref 39.0–52.0)
Hemoglobin: 9.1 g/dL — ABNORMAL LOW (ref 13.0–17.0)
MCH: 32 pg (ref 26.0–34.0)
MCHC: 31.9 g/dL (ref 30.0–36.0)
MCV: 100.4 fL — ABNORMAL HIGH (ref 80.0–100.0)
Platelets: 141 K/uL — ABNORMAL LOW (ref 150–400)
RBC: 2.84 MIL/uL — ABNORMAL LOW (ref 4.22–5.81)
RDW: 14.2 % (ref 11.5–15.5)
WBC: 8.7 K/uL (ref 4.0–10.5)
nRBC: 0 % (ref 0.0–0.2)

## 2024-03-19 LAB — RENAL FUNCTION PANEL
Albumin: 3 g/dL — ABNORMAL LOW (ref 3.5–5.0)
Anion gap: 10 (ref 5–15)
BUN: 42 mg/dL — ABNORMAL HIGH (ref 8–23)
CO2: 23 mmol/L (ref 22–32)
Calcium: 9.9 mg/dL (ref 8.9–10.3)
Chloride: 102 mmol/L (ref 98–111)
Creatinine, Ser: 3.61 mg/dL — ABNORMAL HIGH (ref 0.61–1.24)
GFR, Estimated: 17 mL/min — ABNORMAL LOW (ref >60)
Glucose, Bld: 103 mg/dL — ABNORMAL HIGH (ref 70–99)
Phosphorus: 5 mg/dL — ABNORMAL HIGH (ref 2.5–4.6)
Potassium: 3.8 mmol/L (ref 3.5–5.1)
Sodium: 136 mmol/L (ref 135–145)

## 2024-03-19 LAB — MAGNESIUM: Magnesium: 2.3 mg/dL (ref 1.7–2.4)

## 2024-03-19 LAB — VITAMIN B6: Vitamin B6: 5.1 ug/L (ref 3.4–65.2)

## 2024-03-19 LAB — VANCOMYCIN, RANDOM: Vancomycin Rm: 17 ug/mL

## 2024-03-19 MED ORDER — ONDANSETRON HCL 4 MG/2ML IJ SOLN
4.0000 mg | Freq: Four times a day (QID) | INTRAMUSCULAR | Status: DC | PRN
  Administered 2024-03-19 – 2024-03-25 (×3): 4 mg via INTRAVENOUS
  Filled 2024-03-19 (×3): qty 2

## 2024-03-19 MED ORDER — TRAZODONE HCL 50 MG PO TABS
50.0000 mg | ORAL_TABLET | Freq: Every evening | ORAL | Status: DC | PRN
  Administered 2024-03-21 – 2024-03-25 (×3): 50 mg via ORAL
  Filled 2024-03-19 (×3): qty 1

## 2024-03-19 MED ORDER — HEPARIN SODIUM (PORCINE) 1000 UNIT/ML IJ SOLN
INTRAMUSCULAR | Status: AC
  Filled 2024-03-19: qty 4

## 2024-03-19 MED ORDER — GUAIFENESIN-DM 100-10 MG/5ML PO SYRP
5.0000 mL | ORAL_SOLUTION | ORAL | Status: DC | PRN
  Administered 2024-03-19: 5 mL via ORAL
  Filled 2024-03-19: qty 10

## 2024-03-19 MED ORDER — SODIUM CHLORIDE 3 % IN NEBU
4.0000 mL | INHALATION_SOLUTION | Freq: Two times a day (BID) | RESPIRATORY_TRACT | Status: DC
Start: 2024-03-19 — End: 2024-03-22
  Administered 2024-03-19 – 2024-03-20 (×3): 4 mL via RESPIRATORY_TRACT
  Filled 2024-03-19 (×5): qty 4

## 2024-03-19 MED ORDER — PHENOL 1.4 % MT LIQD
1.0000 | OROMUCOSAL | Status: DC | PRN
  Administered 2024-03-19: 1 via OROMUCOSAL
  Filled 2024-03-19: qty 177

## 2024-03-19 MED ORDER — BISACODYL 10 MG RE SUPP
10.0000 mg | Freq: Once | RECTAL | Status: AC
  Administered 2024-03-19: 10 mg via RECTAL
  Filled 2024-03-19: qty 1

## 2024-03-19 NOTE — Progress Notes (Signed)
 PT Cancellation Note  Patient Details Name: Dave Brown MRN: [id number] DOB: 09/14/1945   Cancelled Treatment:     Pt receiving HD at bedside. Will re-attempt next available date/time per POC.    [name] 03/19/2024, 2:44 PM

## 2024-03-19 NOTE — Progress Notes (Signed)
 [facility] [place], [place] 03/19/24  Subjective:   Hospital day # 27 78 year old male with hypertension, heart failure with preserved ejection fraction, hyperlipidemia, stage IV CKD, obstructive sleep apnea, MGUS, chronic respiratory failure on 2 L oxygen  presented from [facility] with shortness of breath.  Transferred to [facility] for need of continuous dialysis.  Update:  Patient seen sitting up in bed Alert Denies pain or shortness of breath  Creatinine 3.61 Urine output  Objective:  Vital signs in last 24 hours:  Temp:  [98 F (36.7 C)-99 F (37.2 C)] 98 F (36.7 C) (11/25 0715) Pulse Rate:  [83-108] 95 (11/25 0700) Resp:  [14-32] 22 (11/25 0700) BP: (93-126)/(46-82) 105/71 (11/25 0700) SpO2:  [84 %-99 %] 95 % (11/25 0811) Arterial Line BP: (94-165)/(45-86) 115/45 (11/25 0320) FiO2 (%):  [28 %] 28 % (11/24 2104) Weight:  [872 kg] 127 kg (11/25 0320)  Weight change: -0.8 kg Filed Weights   03/17/24 1020 03/18/24 0500 03/19/24 0320  Weight: 128.3 kg 127.1 kg 127 kg    Intake/Output:    Intake/Output Summary (Last 24 hours) at 03/19/2024 1025 Last data filed at 03/19/2024 0717 Gross per 24 hour  Intake 77.01 ml  Output 675 ml  Net -597.99 ml     Physical Exam: General: Elderly  Pulm/lungs Scattered rhonchi, normal effort  CVS/Heart S1S2 no rubs  Abdomen:  Distended, nontender  Extremities: Dependent edema is present  Neurologic: Alert, hard of hearing  Skin: Warm, dry  Access: Left femoral temporary dialysis catheter       Basic Metabolic Panel:  Recent Labs  Lab 03/13/24 0346 03/13/24 1650 03/14/24 0324 03/14/24 1611 03/15/24 0135 03/15/24 0509 03/16/24 0414 03/17/24 0503 03/17/24 1407 03/18/24 0520 03/19/24 0359  NA 135   < > 135   < > 134*   < > 136 137 135 136 136  K 4.0   < > 4.1   < > 4.3   < > 4.1 3.9 3.8 4.2 3.8  CL 100   < > 101   < > 101   < > 103 103 102 103 102  CO2 26   < > 25   < > 24   < > 23  23 24 23 23   GLUCOSE 147*   < > 145*   < > 145*   < > 122* 117* 126* 115* 103*  BUN 14   < > 14   < > 13   < > 22 29* 23 31* 42*  CREATININE 1.27*   < > 1.32*   < > 1.29*   < > 2.13* 2.63* 2.21* 2.92* 3.61*  CALCIUM  8.8*   < > 8.7*   < > 8.9   < > 9.6 9.6 9.4 9.6 9.9  MG 2.3  --  2.3  --  2.4  --  2.4  --   --   --  2.3  PHOS 2.7   < > 2.9   < >  --    < > 2.7 3.8 3.0 4.0 5.0*   < > = values in this interval not displayed.     CBC: Recent Labs  Lab 03/16/24 0414 03/17/24 0503 03/17/24 1407 03/18/24 0520 03/19/24 0359  WBC 7.6 8.7 7.9 10.5 8.7  HGB 10.6* 10.0* 10.3* 9.3* 9.1*  HCT 33.2* 30.6* 32.4* 29.5* 28.5*  MCV 101.2* 99.4 100.0 100.0 100.4*  PLT 106* 106* 115* 144* 141*      Lab Results  Component Value  Date   HEPBSAG NON REACTIVE 03/16/2024      Microbiology:  Recent Results (from the past 240 hours)  Culture, Respiratory w Gram Stain     Status: None   Collection Time: 03/12/24  8:21 AM   Specimen: Tracheal Aspirate; Respiratory  Result Value Ref Range Status   Specimen Description   Final    TRACHEAL ASPIRATE Performed at [facility], [address] [place], [place] [address]    Special Requests   Final    NONE Performed at [facility], [address] [place], [place] [address]    Gram Stain   Final    RARE WBC PRESENT,BOTH PMN AND MONONUCLEAR FEW GRAM POSITIVE RODS    Culture   Final    MODERATE METHICILLIN RESISTANT STAPHYLOCOCCUS AUREUS WITHIN MIXED FLORA Performed at [facility], [address] [place], [place] [address]    Report Status 03/15/2024 FINAL  Final   Organism ID, Bacteria METHICILLIN RESISTANT STAPHYLOCOCCUS AUREUS  Final      Susceptibility   Methicillin resistant staphylococcus aureus - MIC*    CIPROFLOXACIN >=8 RESISTANT Resistant     ERYTHROMYCIN >=8 RESISTANT Resistant     GENTAMICIN <=0.5 SENSITIVE Sensitive     OXACILLIN >=4 RESISTANT Resistant     TETRACYCLINE <=1 SENSITIVE Sensitive      VANCOMYCIN  1 SENSITIVE Sensitive     TRIMETH/SULFA >=320 RESISTANT Resistant     CLINDAMYCIN <=0.25 SENSITIVE Sensitive     RIFAMPIN <=0.5 SENSITIVE Sensitive     Inducible Clindamycin NEGATIVE Sensitive     LINEZOLID  2 SENSITIVE Sensitive     * MODERATE METHICILLIN RESISTANT STAPHYLOCOCCUS AUREUS  MRSA Next Gen by PCR, Nasal     Status: Abnormal   Collection Time: 03/13/24 11:57 AM   Specimen: Nasal Mucosa; Nasal Swab  Result Value Ref Range Status   MRSA by PCR Next Gen DETECTED (A) NOT DETECTED Final    Comment: RESULT CALLED TO, READ BACK BY AND VERIFIED WITH: JORGE MORALES AT 1326 ON 03/13/24 BY SS (NOTE) The GeneXpert MRSA Assay (FDA approved for NASAL specimens only), is one component of a comprehensive MRSA colonization surveillance program. It is not intended to diagnose MRSA infection nor to guide or monitor treatment for MRSA infections. Test performance is not FDA approved in patients less than 27 years old. Performed at [facility], [address] [place], [place] [address]     Coagulation Studies: No results for input(s): LABPROT, INR in the last 72 hours.   Urinalysis: No results for input(s): COLORURINE, LABSPEC, PHURINE, GLUCOSEU, HGBUR, BILIRUBINUR, KETONESUR, PROTEINUR, UROBILINOGEN, NITRITE, LEUKOCYTESUR in the last 72 hours.  Invalid input(s): APPERANCEUR    Imaging: No results found.     Medications:    anticoagulant sodium citrate      dexmedetomidine  (PRECEDEX ) IV infusion Stopped (03/18/24 0823)   heparin  Stopped (03/17/24 1749)   norepinephrine  (LEVOPHED ) Adult infusion Stopped (03/18/24 2236)    bisacodyl   10 mg Rectal Once   Chlorhexidine  Gluconate Cloth  6 each Topical Q0600   ipratropium-albuterol   3 mL Nebulization TID   melatonin  2.5 mg Oral QHS   midodrine   5 mg Oral TID WC   multivitamin  1 tablet Oral QHS   nystatin   5 mL Oral QID   mouth rinse  15 mL Mouth Rinse 4 times per day    pantoprazole  (PROTONIX ) IV  40 mg Intravenous QHS   Ensure Max Protein  11 oz Oral TID   sertraline   25 mg Per Tube Daily   sodium chloride  HYPERTONIC  4 mL Nebulization BID   vancomycin  variable dose per unstable renal function (pharmacist dosing)   Does not apply See admin instructions   alteplase , anticoagulant sodium citrate , diazepam , docusate sodium , fentaNYL  (SUBLIMAZE ) injection, guaiFENesin -dextromethorphan , heparin , ipratropium-albuterol , mouth rinse, phenol, polyethylene glycol, sodium chloride  flush, traZODone   Assessment/ Plan:  78 y.o. 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  Baseline creatinine of 2.5-2.8. Acute kidney injury is thought to be secondary to pneumonia and circulatory shock causing ATN.  Patient responded well to IV Lasix  03/13/24.  However due to low CVP, further doses were held. CRRT stopped 11/22. First HD 11/23.  Update:  Urine output decreased, .  Creatinine increased.  Will schedule dialysis for today, UF 1.5L as tolerated. Will continue to assess need for dialysis daily.   Lab Results  Component Value Date   CREATININE 3.61 (H) 03/19/2024   CREATININE 2.92 (H) 03/18/2024   CREATININE 2.21 (H) 03/17/2024     Intake/Output Summary (Last 24 hours) at 03/19/2024 1025 Last data filed at 03/19/2024 0717 Gross per 24 hour  Intake 77.01 ml  Output 675 ml  Net -597.99 ml     Acute respiratory failure Respiratory pathogen's panel by PCR is negative.  Blood cultures from 03/06/2024 are negative. Remains on 4L [place]  Anemia of CKD.  Hemoglobin 9.1.  No immediate need for ESAs but may  need to consider adding.    LOS: 11 [facility] 11/25/202510:25 AM  [address] [place], [place] [phone]

## 2024-03-19 NOTE — Progress Notes (Addendum)
 1800 PM: The patient had a vomiting episode yellow color and malodorous. NP has been notified that the patient had a large mucus bowel movement after receiving a rectal suppository. A KUB has been ordered and Zofran  has been order for nausea. The patient has had a poor appetite as well. Currently the patient continues to cough and have dry heaves and belching at times.    1740 PM: 1.5 liters of fluid have been removed via dialysis today.    1500 PM: The patient is currently receiving hemodialysis treatment today.

## 2024-03-19 NOTE — Procedures (Signed)
 Patient in ICU in bed. Patient alert to self Informed consent signed and in chart.   Accessed pt's left femoral catheter with no issues. Initiated tx and pt's Arterial Pressure alarmed repeatedly. Reversed lines to continue tx. Pt's Venous pressure kept alarming as well, turned down BFR to 350 to stabilize venous pressures at 350.   Pt venous pressures kept rising and alarming, turned BFR to 300 duration of tx.   TX duration: 3 hrs  Patient completed tx.  Alert, without acute distress.  Hand-off given to patient's nurse.   Access used: Left Femoral catheter Access issues:   Total UF removed: 1.5L Medication(s) given: None Post HD weight:  Post HD VS: 115/59   Kaisha Wachob [facility]

## 2024-03-19 NOTE — Progress Notes (Signed)
 NAME:  Dave Brown, MRN:  [id number], DOB:  01-22-1946, LOS: 11 ADMISSION DATE:  03/08/2024, CONSULTATION DATE:  03/08/2024  Brief Pt Description / Synopsis:  78 y.o. male admitted with Acute on Chronic HFpEF, Cardiogenic/Septic shock, AKI, Acute Hypoxic Respiratory Failure, and MRSA Pneumonia requiring mechanical ventilation and initiation of CRRT.   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 He denied any fevers, chills, chest pain.     Of note, the patient was recently admitted to the hospital from 02/01/2024 to 02/05/2024 for respiratory failure due to CHF.  The patient did not require oxygen  at the time of his discharge.  His discharge weight was 286.  At his last office visit with his primary provider his weight was 291 pounds on 02/22/24.  Follow up with pulmonary on 03/04/24 office weight 29.   He was discharged home with furosemide  40 mg daily during last hospitalization   ED Course: Initial Vital Signs: afebrile and hemodynamically stable. Oxygen  saturation was 93-97% on 4 L  Significant Labs: WBC 11.0, hemoglobin 12.3, platelets 138. Sodium 141, potassium 4.1, bicarbonate 31, serum creatinine 4.15. proBNP 16,139. PCT 0.13. Lactic acid 1.9 >> 0.8. COVID-negative  Imaging Chest X-ray>>patchy opacities right lower lobe and left lower lobe  Medications Administered: furosemide  40 mg IV, Solu-Medrol  125 mg, ceftriaxone , and azithromycin . He was given albuterol  and Atrovent .    [facility] asked to admit for further workup and treatment.  He gradually became hypotensive requiring initiation of low dose Levophed  (3 to 5 mcg), PCCM was consulted.  Nephrology was consulted for oliguria. Case was  discussed with nephrology and it was felt the patient need to be initiated on CRRT. Subsequently, initial request was made to transfer the patient to [facility]. In order to expedite care, he is being transferred to [facility] regional ICU to critical care service where nephrology will be consulted for continued management of his renal failure and fluid overload.   Please see Significant Hospital Events section below for full detailed hospital course.   Pertinent  Medical History   Past Medical History:  Diagnosis Date   CHF (congestive heart failure) (HCC)    Degenerative joint disease    High cholesterol    Hypertension    Kidney stones    Obesity    Respiratory failure with hypoxia (HCC)    Sleep apnea     Micro Data:  11/12: Blood cultures x2>> no growth 11/13: MRSA PCR>> negative 11/13: RVP>> negative 11/24: MRSA PCR>> negative 11/18: Tracheal Aspirate>> MRSA 11/19: MRSA PCR +  Antimicrobials:   Anti-infectives (From admission, onward)    Start     Dose/Rate Route Frequency Ordered Stop   03/17/24 1300  vancomycin  (VANCOCIN ) IVPB 1000 mg/200 mL premix        1,000 mg 200 mL/hr over 60 Minutes Intravenous  Once 03/17/24 1147 03/17/24 1420   03/16/24 1200  vancomycin  (VANCOCIN ) IVPB 1000 mg/200 mL premix        1,000 mg 200 mL/hr over 60 Minutes Intravenous  Once 03/16/24 1104 03/16/24 2100   03/15/24 2200  piperacillin -tazobactam (ZOSYN ) IVPB 3.375 g        3.375 g 12.5 mL/hr over 240 Minutes Intravenous Every 8 hours  03/15/24 1412 03/17/24 0128   03/15/24 1600  vancomycin  (VANCOREADY) IVPB 1500 mg/300 mL        1,500 mg 150 mL/hr over 120 Minutes Intravenous  Once 03/15/24 1411 03/15/24 1705   03/15/24 1411  vancomycin  variable dose per unstable renal function (pharmacist dosing)         Does not apply See admin instructions 03/15/24 1411     03/15/24 1400  vancomycin  (VANCOREADY) IVPB 1500 mg/300 mL  Status:  Discontinued        1,500 mg 150 mL/hr over 120  Minutes Intravenous Every 24 hours 03/14/24 1139 03/15/24 1411   03/14/24 1200  vancomycin  (VANCOREADY) IVPB 2000 mg/400 mL        2,000 mg 200 mL/hr over 120 Minutes Intravenous  Once 03/14/24 1012 03/14/24 1405   03/12/24 1200  piperacillin -tazobactam (ZOSYN ) IVPB 3.375 g  Status:  Discontinued        3.375 g 100 mL/hr over 30 Minutes Intravenous Every 6 hours 03/12/24 1013 03/15/24 1412        Significant Hospital Events: Including procedures, antibiotic start and stop dates in addition to other pertinent events   11/12: Admitted to [facility] to [place] for treatment of Acute Decompensated HFpEF and AKI. 11/13: BP decreased, started on Levophed .  PCCM consulted.  ABX discontinued. 11/14: Transfer to [facility] for initiation of CRRT.  11/15: seizure like episode, intubated 11/16: Unable to wean; PICC line placed 11/21: Extubated to Bipap. Added Dobutamine .  11/23: Patient remains on bipap this AM. WBC stable at 7.6. H&H Stable. [place] SvO2 67%.  Electrolytes within normal range. NE decreased to 4. Remains on Dobutamin at 5.  11/24: Off BiPAP, awake and alert but remains encephalopathic.  Afebrile, Remains on Levophed , currently 10 mcg, weaning as tolerated.  Creatinine increased to 2.9, but UOP 1.4 L (net - 7.8L), Nephrology holding off on HD today. 11/25: No significant events overnight.  Midodrine  started overnight, now off Levophed .   Precedex  weaned off yesterday, remains encephalopathic but slowly improving. On 4L [place]. Creatinine worsened to 3.6, UOP 675 cc last 24 hrs (net - 8.4L), plan for HD today.  Plan for aggressive pulmonary toilet and mobilization as able.   Interim History / Subjective:  As outlined above under Significant Hospital Events section  Objective   Blood pressure 105/71, pulse 95, temperature 98 F (36.7 C), temperature source Axillary, resp. rate (!) 22, height 5' 9.02 (1.753 m), weight 127 kg, SpO2 95%.    FiO2 (%):  [28 %] 28 %   Intake/Output Summary (Last 24  hours) at 03/19/2024 0800 Last data filed at 03/19/2024 9282 Gross per 24 hour  Intake 104.51 ml  Output 675 ml  Net -570.49 ml   Filed Weights   03/17/24 1020 03/18/24 0500 03/19/24 0320  Weight: 128.3 kg 127.1 kg 127 kg    Examination: General: Acute on chronically ill appearing obese male, laying in bed, on [place], in NAD HENT: Atramatic, normocephalic, neck supple, difficult to assess JVD due to body habitus Lungs: Coarse breath sounds throughout, even, nonlabored Cardiovascular: RRR, s1s2, no M/R/G Abdomen: Obese, soft, nontender, slightly distended and slightly taught, no guarding or rebound tenderness, BS+ x4 Extremities: Generalized weakness, normal bulk and tone, warm, trace edema Neuro: Awake and alert, oriented to self and place, moves all extremities purposefully, no focal deficits noted GU: external male catheter in place  Resolved Hospital Problem list     Assessment & Plan:   #Acute on Chronic HFpEF #Shock: Cardiogenic +/- Septic ~  RESOLVED  #Paroxsymal A. Fib -Echocardiogram 03/16/24: LVEF >75%, grade I DD, RV systolic function normal, RV size normal -Continuous cardiac monitoring -Maintain MAP >65 -Vasopressors as needed to maintain MAP goal ~ weaned off -Continue Midodrine  -Volume removal with HD -Cardiology following, appreciate input ~ plan for cardiac cath at some point -Holding Heparin  gtt due to mild hemoptysis/hematuria   #Acute Hypoxic Respiratory Failure due to ... #Pulmonary Edema #MRSA Pneumonia Intubated 11/15; Extubated 11/21 -Supplemental O2 as needed to maintain O2 sats >92% -BiPAP, wean as tolerated -Follow intermittent Chest X-ray & ABG as needed -Bronchodilators & Pulmicort  nebs -ABX as above  -Diuresis as BP and renal function permits/Volume removal with HD -Pulmonary toilet as able  #MRSA Pneumonia -Monitor fever curve -Trend WBC's & Procalcitonin -Follow cultures as above -Continue empiric Vancomycin  pending cultures &  sensitivities (to complete 8 day course on 11/25)  #AKI on CKD -Monitor I&O's / urinary output -Follow BMP -Ensure adequate renal perfusion -Avoid nephrotoxic agents as able -Replace electrolytes as indicated ~ Pharmacy following for assistance with electrolyte replacement -Nephrology following, appreciate input ~ off CRRT, intermittent HD as per Nephrology   #Diabetes Mellitus Type II -CBG's q4h; Target range of 140 to 180 -SSI -Follow ICU Hypo/Hyperglycemia protocol  #Acute Metabolic Encephalopathy CT Head negative, EEG normal -Treatment of metabolic derangements as outlined above -Provide supportive care -Promote normal sleep/wake cycle and family presence -Avoid sedating medications as able -Precedex  as needed ~ weaned off 11/24 -Mobilize as able      [employer] (right click and Reselect all SmartList Selections daily)   Diet/type: Regular consistency (see orders) DVT prophylaxis: SCD GI prophylaxis: PPI Lines: Central line, Dialysis Catheter, and yes and it is still needed Foley:  N/A Code Status:  full code Last date of multidisciplinary goals of care discussion [11/25]  11/25: Will update pt's family when they arrive at bedside on plan of care.  Labs   CBC: Recent Labs  Lab 03/16/24 0414 03/17/24 0503 03/17/24 1407 03/18/24 0520 03/19/24 0359  WBC 7.6 8.7 7.9 10.5 8.7  HGB 10.6* 10.0* 10.3* 9.3* 9.1*  HCT 33.2* 30.6* 32.4* 29.5* 28.5*  MCV 101.2* 99.4 100.0 100.0 100.4*  PLT 106* 106* 115* 144* 141*    Basic Metabolic Panel: Recent Labs  Lab 03/13/24 0346 03/13/24 1650 03/14/24 0324 03/14/24 1611 03/15/24 0135 03/15/24 0509 03/16/24 0414 03/17/24 0503 03/17/24 1407 03/18/24 0520 03/19/24 0359  NA 135   < > 135   < > 134*   < > 136 137 135 136 136  K 4.0   < > 4.1   < > 4.3   < > 4.1 3.9 3.8 4.2 3.8  CL 100   < > 101   < > 101   < > 103 103 102 103 102  CO2 26   < > 25   < > 24   < > 23 23 24 23 23   GLUCOSE 147*   < > 145*   < > 145*    < > 122* 117* 126* 115* 103*  BUN 14   < > 14   < > 13   < > 22 29* 23 31* 42*  CREATININE 1.27*   < > 1.32*   < > 1.29*   < > 2.13* 2.63* 2.21* 2.92* 3.61*  CALCIUM  8.8*   < > 8.7*   < > 8.9   < > 9.6 9.6 9.4 9.6 9.9  MG 2.3  --  2.3  --  2.4  --  2.4  --   --   --  2.3  PHOS 2.7   < > 2.9   < >  --    < > 2.7 3.8 3.0 4.0 5.0*   < > = values in this interval not displayed.   GFR: Estimated Creatinine Clearance: 22.2 mL/min (A) (by C-G formula based on SCr of 3.61 mg/dL (H)). Recent Labs  Lab 03/15/24 0135 03/16/24 0414 03/17/24 0503 03/17/24 1407 03/18/24 0520 03/18/24 1356 03/19/24 0359  WBC 12.2*   < > 8.7 7.9 10.5  --  8.7  LATICACIDVEN 0.7  --   --   --   --  0.8  --    < > = values in this interval not displayed.    Liver Function Tests: Recent Labs  Lab 03/16/24 0414 03/17/24 0503 03/17/24 1407 03/18/24 0520 03/19/24 0359  ALBUMIN 3.1* 2.9* 3.0* 3.2* 3.0*   No results for input(s): LIPASE, AMYLASE in the last 168 hours. No results for input(s): AMMONIA in the last 168 hours.  ABG    Component Value Date/Time   PHART 7.41 03/16/2024 1412   PCO2ART 41 03/16/2024 1412   PO2ART 167 (H) 03/16/2024 1412   HCO3 26.0 03/16/2024 1412   ACIDBASEDEF 0.8 03/16/2024 1153   O2SAT 95.4 03/18/2024 1356     Coagulation Profile: No results for input(s): INR, PROTIME in the last 168 hours.  Cardiac Enzymes: No results for input(s): CKTOTAL, CKMB, CKMBINDEX, TROPONINI in the last 168 hours.  HbA1C: Hgb A1c MFr Bld  Date/Time Value Ref Range Status  03/08/2024 03:11 AM 5.2 4.8 - 5.6 % Final    Comment:    (NOTE) Diagnosis of Diabetes The following HbA1c ranges recommended by the American Diabetes Association (ADA) may be used as an aid in the diagnosis of diabetes mellitus.  Hemoglobin             Suggested A1C NGSP%              Diagnosis  <5.7                   Non Diabetic  5.7-6.4                Pre-Diabetic  >6.4                    Diabetic  <7.0                   Glycemic control for                       adults with diabetes.    05/02/2020 02:37 PM 6.9 (H) 4.8 - 5.6 % Final    Comment:    (NOTE) Pre diabetes:          5.7%-6.4%  Diabetes:              >6.4%  Glycemic control for   <7.0% adults with diabetes     CBG: Recent Labs  Lab 03/17/24 2336 03/18/24 0515 03/18/24 0732 03/18/24 1115 03/18/24 1533  GLUCAP 85 113* 153* 117* 108*    Review of Systems:   Unable to assess due to AMS   Past Medical History:  He,  has a past medical history of CHF (congestive heart failure) (HCC), Degenerative joint disease, High cholesterol, Hypertension, Kidney stones, Obesity, Respiratory failure with hypoxia (HCC), and Sleep apnea.   Surgical History:  History reviewed. No pertinent surgical history.  Social History:   reports that he quit smoking about 25 years ago. His smoking use included cigars. He does not have any smokeless tobacco history on file. He reports that he does not drink alcohol and does not use drugs.   Family History:  His family history is not on file.   Allergies No Known Allergies   Home Medications  Prior to Admission medications   Medication Sig Start Date End Date Taking? Authorizing Provider  albuterol  (PROVENTIL ) (2.5 MG/3ML) 0.083% nebulizer solution Take 2.5 mg by nebulization every 6 (six) hours as needed for shortness of breath or wheezing. 03/04/24  Yes [provider]  hydrocortisone  sodium succinate  (SOLU-CORTEF ) 100 MG injection Inject 2 mLs (100 mg total) into the vein every 8 (eight) hours. 03/08/24  Yes Tat, Alm, MD  midodrine  (PROAMATINE ) 10 MG tablet Take 1 tablet (10 mg total) by mouth 3 (three) times daily with meals. 03/08/24  Yes Tat, Alm, MD  norepinephrine  (LEVOPHED ) 4-5 MG/250ML-% SOLN Inject 0-10 mcg/min into the vein continuous. 03/08/24  Yes Tat, Alm, MD  rosuvastatin  (CRESTOR ) 10 MG tablet Take 10 mg by mouth daily.   Yes [provider]  sertraline  (ZOLOFT ) 25 MG tablet Take 25 mg by mouth in the morning.   Yes [provider]  TYLENOL  325 MG tablet Take 325-650 mg by mouth every 8 (eight) hours as needed for mild pain (pain score 1-3) (or headaches).   Yes [provider]     Critical care time: 40 minutes     Inge Lecher, AGACNP-BC [facility] Prefer epic messenger for cross cover needs If after hours, please call E-link

## 2024-03-19 NOTE — TOC Progression Note (Signed)
 Transition of Care [facility]) - Progression Note    Patient Details  Name: Dave Brown MRN: [id number] Date of Birth: 10-05-1945  [facility] CM/SW Contact  K'La JINNY Ruts, LCSW Phone Number: 03/19/2024, 3:16 PM  Clinical Narrative:    Chart reviewed. Spoke with the patient daughter about [facility] agencies and the patient daughter selected [facility] for [facility] services.   I called shaun from [facility] and he is aware. Please call shaun when patient is D/C.   Expected Discharge Plan: Home w Home Health Services Barriers to Discharge: Continued Medical Work up               Expected Discharge Plan and Services   Discharge Planning Services: CM Consult   Living arrangements for the past 2 months: Single Family Home                                       Social Drivers of Health (SDOH) Interventions SDOH Screenings   Food Insecurity: No Food Insecurity (03/07/2024)  Housing: Low Risk  (03/07/2024)  Transportation Needs: No Transportation Needs (03/07/2024)  Utilities: Not At Risk (03/07/2024)  Social Connections: Unknown (03/07/2024)  Tobacco Use: Medium Risk (03/19/2024)    Readmission Risk Interventions    03/18/2024   12:04 PM  Readmission Risk Prevention Plan  Transportation Screening Complete  PCP or Specialist Appt within 3-5 Days Complete  HRI or Home Care Consult Complete  Social Work Consult for Recovery Care Planning/Counseling Complete  Palliative Care Screening Not Applicable  Medication Review [employer]) Complete

## 2024-03-19 NOTE — Plan of Care (Signed)
  Problem: Clinical Measurements: Goal: Ability to maintain clinical measurements within normal limits will improve Outcome: Progressing Goal: Diagnostic test results will improve Outcome: Progressing Goal: Respiratory complications will improve Outcome: Not Progressing Goal: Cardiovascular complication will be avoided Outcome: Progressing   Problem: Nutrition: Goal: Adequate nutrition will be maintained Outcome: Progressing   Problem: Elimination: Goal: Will not experience complications related to bowel motility Outcome: Progressing Goal: Will not experience complications related to urinary retention Outcome: Progressing   Problem: Metabolic: Goal: Ability to maintain appropriate glucose levels will improve Outcome: Progressing

## 2024-03-19 NOTE — Consult Note (Signed)
 PHARMACY CONSULT NOTE - ELECTROLYTES  Pharmacy Consult for Electrolyte Monitoring and Replacement   Recent Labs: Height: 5' 9.02 (175.3 cm) Weight: 127 kg (279 lb 15.8 oz) IBW/kg (Calculated) : 70.74 Estimated Creatinine Clearance: 22.2 mL/min (A) (by C-G formula based on SCr of 3.61 mg/dL (H)). Potassium  Date Value  03/19/2024 3.8 mmol/L  07/18/2013 3.4 mEq/L (L)   Magnesium (mg/dL)  Date Value  88/74/7974 2.3   Calcium  (mg/dL)  Date Value  88/74/7974 9.9  07/18/2013 10.1   Albumin (g/dL)  Date Value  88/74/7974 3.0 (L)  07/18/2013 3.8   Phosphorus (mg/dL)  Date Value  88/74/7974 5.0 (H)   Sodium  Date Value  03/19/2024 136 mmol/L  07/18/2013 138 mEq/L   Assessment  Dave Brown is a 78 y.o. male presenting with AKI requiring CRRT. PMH significant for HFpEF and COPD requiring 2L supplemental oxygen . Pharmacy has been consulted to monitor and replace electrolytes.  Goal of Therapy: Electrolytes WNL  Plan:  No electrolyte replacement warranted for today. Scr continues to trend up Check BMP tomorrow  Thank you for allowing pharmacy to be a part of this patient's care.  Ernestine Langworthy A Graciana Sessa 03/19/2024 7:36 AM

## 2024-03-19 NOTE — Plan of Care (Signed)
  Problem: Education: Goal: Knowledge of General Education information will improve Description: Including pain rating scale, medication(s)/side effects and non-pharmacologic comfort measures Outcome: Progressing   Problem: Health Behavior/Discharge Planning: Goal: Ability to manage health-related needs will improve Outcome: Progressing   Problem: Clinical Measurements: Goal: Ability to maintain clinical measurements within normal limits will improve Outcome: Progressing Goal: Will remain free from infection Outcome: Progressing Goal: Diagnostic test results will improve Outcome: Progressing Goal: Respiratory complications will improve Outcome: Progressing Goal: Cardiovascular complication will be avoided Outcome: Progressing   Problem: Activity: Goal: Risk for activity intolerance will decrease Outcome: Progressing   Problem: Nutrition: Goal: Adequate nutrition will be maintained Outcome: Progressing   Problem: Coping: Goal: Level of anxiety will decrease Outcome: Progressing   Problem: Elimination: Goal: Will not experience complications related to bowel motility Outcome: Progressing Goal: Will not experience complications related to urinary retention Outcome: Progressing   Problem: Pain Managment: Goal: General experience of comfort will improve and/or be controlled Outcome: Progressing   Problem: Safety: Goal: Ability to remain free from injury will improve Outcome: Progressing   Problem: Skin Integrity: Goal: Risk for impaired skin integrity will decrease Outcome: Progressing   Problem: Education: Goal: Ability to describe self-care measures that may prevent or decrease complications (Diabetes Survival Skills Education) will improve Outcome: Progressing Goal: Individualized Educational Video(s) Outcome: Progressing   Problem: Coping: Goal: Ability to adjust to condition or change in health will improve Outcome: Progressing   Problem: Fluid  Volume: Goal: Ability to maintain a balanced intake and output will improve Outcome: Progressing   Problem: Health Behavior/Discharge Planning: Goal: Ability to identify and utilize available resources and services will improve Outcome: Progressing Goal: Ability to manage health-related needs will improve Outcome: Progressing   Problem: Metabolic: Goal: Ability to maintain appropriate glucose levels will improve Outcome: Progressing   Problem: Nutritional: Goal: Maintenance of adequate nutrition will improve Outcome: Progressing Goal: Progress toward achieving an optimal weight will improve Outcome: Progressing   Problem: Skin Integrity: Goal: Risk for impaired skin integrity will decrease Outcome: Progressing   Problem: Tissue Perfusion: Goal: Adequacy of tissue perfusion will improve Outcome: Progressing   Problem: Education: Goal: Knowledge of disease and its progression will improve Outcome: Progressing   Problem: Health Behavior/Discharge Planning: Goal: Ability to manage health-related needs will improve Outcome: Progressing   Problem: Clinical Measurements: Goal: Complications related to the disease process or treatment will be avoided or minimized Outcome: Progressing Goal: Dialysis access will remain free of complications Outcome: Progressing   Problem: Activity: Goal: Activity intolerance will improve Outcome: Progressing   Problem: Fluid Volume: Goal: Fluid volume balance will be maintained or improved Outcome: Progressing   Problem: Nutritional: Goal: Ability to make appropriate dietary choices will improve Outcome: Progressing   Problem: Respiratory: Goal: Respiratory symptoms related to disease process will be avoided Outcome: Progressing   Problem: Self-Concept: Goal: Body image disturbance will be avoided or minimized Outcome: Progressing   Problem: Urinary Elimination: Goal: Progression of disease will be identified and treated Outcome:  Progressing

## 2024-03-19 NOTE — Progress Notes (Signed)
  Progress Note  Patient Name: Dave Brown Date of Encounter: 03/19/2024 [facility] Cardiologist: Newman JINNY Lawrence, MD   Interval Summary   Patient weaned off Precedex  and norepinephrine  yesterday.  When asked if he is having any chest pain or shortness of breath, he replies with, it is my right here yet.  He does not answer any further questions.  Patient noted to have some hemoptysis and hematuria 2 days ago prompting discontinuation of heparin  infusion.  Vital Signs Vitals:   03/19/24 0600 03/19/24 0700 03/19/24 0715 03/19/24 0811  BP: 123/67 105/71    Pulse: 98 95    Resp: (!) 22 (!) 22    Temp:   98 F (36.7 C)   TempSrc:   Axillary   SpO2: 95% 95%  95%  Weight:      Height:        Intake/Output Summary (Last 24 hours) at 03/19/2024 1009 Last data filed at 03/19/2024 0717 Gross per 24 hour  Intake 77.01 ml  Output 675 ml  Net -597.99 ml      03/19/2024    3:20 AM 03/18/2024    5:00 AM 03/17/2024   10:20 AM  Last 3 Weights  Weight (lbs) 279 lb 15.8 oz 280 lb 3.3 oz 282 lb 13.6 oz  Weight (kg) 127 kg 127.1 kg 128.3 kg      Telemetry/ECG  Normal sinus rhythm and sinus tachycardia with PVCs (heart rate 90-110 bpm) - Personally Reviewed  Physical Exam  GEN: No acute distress.   Neck: Difficult to assess JVP due to body habitus. Cardiac: Regular rate and rhythm without murmurs. Respiratory: Coarse breath sounds anteriorly. GI: Soft, nontender, non-distended  MS: Trace pretibial edema bilaterally.  Assessment & Plan  Shock: Likely multifactorial but driven primarily by septic shock.  He was on dobutamine  over the weekend, though repeat echo showed vigorous biventricular systolic function prompting weaning of this.  Blood pressure remains low normal on midodrine .  Continue supportive care per CCM.  Acute on chronic HFpEF: Patient noted to have vigorous biventricular systolic function on most recent echo on 03/16/2024.  Severe LVH noted with  concerns for infiltrative cardiomyopathy.  Patient appears to be developing mild volume overload with evidence of edema on exam today.  Dialysis is currently on hold.  Will defer resuming dialysis versus initiation of diuretics to nephrology.  If volume status remains uncertain, right heart catheterization could be considered.  Will need further workup for infiltrative cardiomyopathy/amyloidosis as an outpatient if he makes a meaningful recovery from his prolonged ICU stay.  Acute on chronic respiratory failure: Continue weaning supplemental oxygen  as tolerated.  May benefit from diuresis or ultrafiltration.  Elevated troponin: Suspect this represents supply-demand mismatch.  No plans for ischemia evaluation at this time given other comorbidities.  Paroxysmal atrial fibrillation: Patient maintaining sinus rhythm.  Heparin  has been held due to mild hemoptysis/hematuria.  Resume anticoagulation when felt safe to do so by primary team.  For questions or updates, please contact [facility] Please consult [email or web address] for contact info under [facility].  Signed, Lonni Hanson, MD

## 2024-03-20 LAB — BLOOD GAS, VENOUS
Acid-Base Excess: 4 mmol/L — ABNORMAL HIGH (ref 0.0–2.0)
Acid-base deficit: 2.6 mmol/L — ABNORMAL HIGH (ref 0.0–2.0)
Acid-base deficit: 3.9 mmol/L — ABNORMAL HIGH (ref 0.0–2.0)
Bicarbonate: 28.6 mmol/L — ABNORMAL HIGH (ref 20.0–28.0)
Bicarbonate: 32.5 mmol/L — ABNORMAL HIGH (ref 20.0–28.0)
Bicarbonate: 25.4 mmol/L (ref 20.0–28.0)
Delivery systems: POSITIVE
FIO2: 100 %
O2 Saturation: 77.8 %
O2 Saturation: 74.7 %
Patient temperature: 37
Patient temperature: 37
Patient temperature: 37
pCO2, Ven: 84 mmHg (ref 44–60)
pCO2, Ven: 66 mmHg — ABNORMAL HIGH (ref 44–60)
pCO2, Ven: 65 mmHg — ABNORMAL HIGH (ref 44–60)
pH, Ven: 7.14 — CL (ref 7.25–7.43)
pH, Ven: 7.3 (ref 7.25–7.43)
pH, Ven: 7.2 — ABNORMAL LOW (ref 7.25–7.43)
pO2, Ven: 49 mmHg — ABNORMAL HIGH (ref 32–45)
pO2, Ven: 44 mmHg (ref 32–45)

## 2024-03-20 LAB — RENAL FUNCTION PANEL
Albumin: 3.3 g/dL — ABNORMAL LOW (ref 3.5–5.0)
Anion gap: 10 (ref 5–15)
BUN: 41 mg/dL — ABNORMAL HIGH (ref 8–23)
CO2: 26 mmol/L (ref 22–32)
Calcium: 9.7 mg/dL (ref 8.9–10.3)
Chloride: 99 mmol/L (ref 98–111)
Creatinine, Ser: 3.47 mg/dL — ABNORMAL HIGH (ref 0.61–1.24)
GFR, Estimated: 17 mL/min — ABNORMAL LOW (ref >60)
Glucose, Bld: 131 mg/dL — ABNORMAL HIGH (ref 70–99)
Phosphorus: 7.1 mg/dL — ABNORMAL HIGH (ref 2.5–4.6)
Potassium: 4.9 mmol/L (ref 3.5–5.1)
Sodium: 135 mmol/L (ref 135–145)

## 2024-03-20 LAB — GLUCOSE, CAPILLARY
Glucose-Capillary: 77 mg/dL (ref 70–99)
Glucose-Capillary: 108 mg/dL — ABNORMAL HIGH (ref 70–99)

## 2024-03-20 LAB — CBC
HCT: 31.6 % — ABNORMAL LOW (ref 39.0–52.0)
Hemoglobin: 9.9 g/dL — ABNORMAL LOW (ref 13.0–17.0)
MCH: 32.5 pg (ref 26.0–34.0)
MCHC: 31.3 g/dL (ref 30.0–36.0)
MCV: 103.6 fL — ABNORMAL HIGH (ref 80.0–100.0)
Platelets: 193 K/uL (ref 150–400)
RBC: 3.05 MIL/uL — ABNORMAL LOW (ref 4.22–5.81)
RDW: 14.5 % (ref 11.5–15.5)
WBC: 17.2 K/uL — ABNORMAL HIGH (ref 4.0–10.5)
nRBC: 0 % (ref 0.0–0.2)

## 2024-03-20 LAB — MAGNESIUM: Magnesium: 2.3 mg/dL (ref 1.7–2.4)

## 2024-03-20 MED ORDER — STERILE WATER FOR INJECTION IJ SOLN
INTRAMUSCULAR | Status: AC
  Filled 2024-03-20: qty 10

## 2024-03-20 MED ORDER — BUDESONIDE 0.25 MG/2ML IN SUSP
0.2500 mg | Freq: Two times a day (BID) | RESPIRATORY_TRACT | Status: DC
  Administered 2024-03-20 – 2024-03-24 (×8): 0.25 mg via RESPIRATORY_TRACT
  Filled 2024-03-20 (×8): qty 2

## 2024-03-20 MED ORDER — PRISMASOL BGK 4/2.5 32-4-2.5 MEQ/L EC SOLN: Status: DC

## 2024-03-20 MED ORDER — HEPARIN SODIUM (PORCINE) 5000 UNIT/ML IJ SOLN
5000.0000 [IU] | Freq: Three times a day (TID) | INTRAMUSCULAR | Status: DC
  Administered 2024-03-20 – 2024-04-05 (×46): 5000 [IU] via SUBCUTANEOUS
  Filled 2024-03-20 (×46): qty 1

## 2024-03-20 MED ORDER — DIAZEPAM 5 MG/ML IJ SOLN
2.5000 mg | Freq: Three times a day (TID) | INTRAMUSCULAR | Status: DC | PRN
  Administered 2024-03-20: 2.5 mg via INTRAVENOUS
  Filled 2024-03-20: qty 2

## 2024-03-20 MED ORDER — HEPARIN SODIUM (PORCINE) 1000 UNIT/ML DIALYSIS
1000.0000 [IU] | INTRAMUSCULAR | Status: DC | PRN
  Administered 2024-03-21: 3600 [IU] via INTRAVENOUS_CENTRAL
  Administered 2024-03-22: 4000 [IU] via INTRAVENOUS_CENTRAL
  Administered 2024-03-24: 2600 [IU] via INTRAVENOUS_CENTRAL
  Filled 2024-03-20: qty 3
  Filled 2024-03-20: qty 4
  Filled 2024-03-20: qty 3
  Filled 2024-03-20: qty 4
  Filled 2024-03-20: qty 3

## 2024-03-20 MED ORDER — ALTEPLASE 2 MG IJ SOLR
2.0000 mg | Freq: Once | INTRAMUSCULAR | Status: DC
  Filled 2024-03-20: qty 2

## 2024-03-20 NOTE — Progress Notes (Addendum)
 1754 PM: ICU provider and nephrologist have been notified that the patient has initiated CRRT at this time. Providers have been notified that the patient has not had any urine output during day shift. Per nephrology provider RN to continue to monitor urine output. The patient had two medium size mucous bowel movements today.  1611 PM: Nephrology service has placed orders for CRRT at this time.    1534 PM: Nephrologist has been notified: Dr. Marcelino will you place CRRT orders for this patient. They were doing dialysis at the bedside but their dialysisis machine stopped working 1 hr after initiating treatment and he still has 2 hrs left. Also his Levophed  requirement has increased to needing 12 mcg/min now. thanks   1500 PM: Levophed  continues to be titrated up. The patient is currently receiving hemodialysis at the bedside.   1000 AM: The patient continues to be on Levophed  drip. The patient's diet has been changed to NPO. VBG has been obtained.   0700 PM: The patient continues to be on BiPAP and on Levophed .

## 2024-03-20 NOTE — Progress Notes (Addendum)
 PT Cancellation Note  Patient Details Name: Dave Brown MRN: [id number] DOB: 04-25-1946   Cancelled Treatment:     Pt receiving HD at bedside again today and remains on BiPAP. Will reassess next available date/time per POC.    [name] 03/20/2024, 4:23 PM

## 2024-03-20 NOTE — Progress Notes (Signed)
 SLP Cancellation Note  Patient Details Name: Dave Brown MRN: [id number] DOB: [birth year]   Cancelled treatment:       Reason Eval/Treat Not Completed: Medical issues which prohibited therapy;Patient not medically ready (consulted MDs and NSG re: pt's status and need for Palliative Care consult possibly)   Pt has had a Decline in medical status now requiring BiPAP and HD this morning. Pt is not appropriate for skilled ST services this date. Noted slight improvement in CXR but Increased gaseous distention of the stomach - see Imaging.  ST services will monitor pt's status and f/u w/ therapy as appropriate. Recommend frequent oral care while NPO status.  MD/NSG agreed.     Comer Portugal, MS, CCC-SLP Speech Language Pathologist Rehab Services; [facility] [phone] (ascom) Rance Smithson 03/20/2024, 5:09 PM

## 2024-03-20 NOTE — Consult Note (Signed)
 PHARMACY CONSULT NOTE - ELECTROLYTES  Pharmacy Consult for Electrolyte Monitoring and Replacement   Recent Labs: Height: 5' 9.02 (175.3 cm) Weight: 127 kg (279 lb 15.8 oz) IBW/kg (Calculated) : 70.74 Estimated Creatinine Clearance: 23.1 mL/min (A) (by C-G formula based on SCr of 3.47 mg/dL (H)). Potassium  Date Value  03/20/2024 4.9 mmol/L  07/18/2013 3.4 mEq/L (L)   Magnesium (mg/dL)  Date Value  88/73/7974 2.3   Calcium  (mg/dL)  Date Value  88/73/7974 9.7  07/18/2013 10.1   Albumin (g/dL)  Date Value  88/73/7974 3.3 (L)  07/18/2013 3.8   Phosphorus (mg/dL)  Date Value  88/73/7974 7.1 (H)   Sodium  Date Value  03/20/2024 135 mmol/L  07/18/2013 138 mEq/L   Assessment  Dave Brown is a 78 y.o. male presenting with AKI requiring CRRT. PMH significant for HFpEF and COPD requiring 2L supplemental oxygen . Pharmacy has been consulted to monitor and replace electrolytes.  Goal of Therapy: Electrolytes WNL  Plan:  No electrolyte replacement warranted for today. Scr continues to trend up Patient being assessed for dialysis be nephrology daily Check BMP tomorrow  Thank you for allowing pharmacy to be a part of this patient's care.  Idolina A Tram Wrenn 03/20/2024 7:27 AM

## 2024-03-20 NOTE — Progress Notes (Signed)
 Rounding Note   Patient Name: Dave Brown Date of Encounter: 03/20/2024  [facility] Cardiologist: Newman JINNY Lawrence, MD   Subjective Vomiting yesterday evening, large mucous bowel movement, poor appetite 1.5 L removed via dialysis yesterday Desaturations overnight placed on BiPAP Hypotension requiring restart of Levophed  infusion Hypercapnic Mentation poor Telemetry reviewed, maintaining normal sinus rhythm, no atrial fibrillation  Scheduled Meds:  budesonide  (PULMICORT ) nebulizer solution  0.25 mg Nebulization BID   Chlorhexidine  Gluconate Cloth  6 each Topical Q0600   ipratropium-albuterol   3 mL Nebulization TID   melatonin  2.5 mg Oral QHS   midodrine   5 mg Oral TID WC   multivitamin  1 tablet Oral QHS   nystatin   5 mL Oral QID   mouth rinse  15 mL Mouth Rinse 4 times per day   pantoprazole  (PROTONIX ) IV  40 mg Intravenous QHS   Ensure Max Protein  11 oz Oral TID   sertraline   25 mg Per Tube Daily   Continuous Infusions:  anticoagulant sodium citrate      heparin  Stopped (03/17/24 1749)   norepinephrine  (LEVOPHED ) Adult infusion 4 mcg/min (03/20/24 0934)   PRN Meds: alteplase , anticoagulant sodium citrate , diazepam , docusate sodium , fentaNYL  (SUBLIMAZE ) injection, guaiFENesin -dextromethorphan , heparin , ipratropium-albuterol , ondansetron  (ZOFRAN ) IV, mouth rinse, phenol, polyethylene glycol, sodium chloride  flush, traZODone    Vital Signs  Vitals:   03/20/24 0845 03/20/24 0900 03/20/24 0945 03/20/24 0957  BP:  (!) 96/54    Pulse: 89 89 91 91  Resp: (!) 25 19 (!) 28 (!) 26  Temp:      TempSrc:      SpO2: 96% 92% 96% 96%  Weight:      Height:        Intake/Output Summary (Last 24 hours) at 03/20/2024 1140 Last data filed at 03/20/2024 0934 Gross per 24 hour  Intake 120.41 ml  Output 1950 ml  Net -1829.59 ml      03/20/2024    8:00 AM 03/19/2024    3:20 AM 03/18/2024    5:00 AM  Last 3 Weights  Weight (lbs) 278 lb 279 lb 15.8 oz 280 lb 3.3  oz  Weight (kg) 126.1 kg 127 kg 127.1 kg      Telemetry Normal sinus rhythm- Personally Reviewed  ECG   - Personally Reviewed  Physical Exam  GEN: On BiPAP, poor mentation Neck: Unable to estimate JVD Cardiac: RRR, no murmurs, rubs, or gallops.  Respiratory: Coarse breath sounds, Rales GI: Soft,  non-distended  MS: No edema; No deformity. Neuro:  Nonfocal  Psych: Normal affect   Labs High Sensitivity Troponin:  No results for input(s): TROPONINIHS in the last 720 hours.   Chemistry Recent Labs  Lab 03/16/24 0414 03/17/24 0503 03/18/24 0520 03/19/24 0359 03/20/24 0500  NA 136   < > 136 136 135  K 4.1   < > 4.2 3.8 4.9  CL 103   < > 103 102 99  CO2 23   < > 23 23 26   GLUCOSE 122*   < > 115* 103* 131*  BUN 22   < > 31* 42* 41*  CREATININE 2.13*   < > 2.92* 3.61* 3.47*  CALCIUM  9.6   < > 9.6 9.9 9.7  MG 2.4  --   --  2.3 2.3  ALBUMIN 3.1*   < > 3.2* 3.0* 3.3*  GFRNONAA 31*   < > 21* 17* 17*  ANIONGAP 11   < > 11 10 10    < > = values in this interval  not displayed.    Lipids  Recent Labs  Lab 03/16/24 0414  TRIG 155*    Hematology Recent Labs  Lab 03/18/24 0520 03/19/24 0359 03/20/24 0500  WBC 10.5 8.7 17.2*  RBC 2.95* 2.84* 3.05*  HGB 9.3* 9.1* 9.9*  HCT 29.5* 28.5* 31.6*  MCV 100.0 100.4* 103.6*  MCH 31.5 32.0 32.5  MCHC 31.5 31.9 31.3  RDW 14.1 14.2 14.5  PLT 144* 141* 193   Thyroid  No results for input(s): TSH, FREET4 in the last 168 hours.   BNPNo results for input(s): BNP, PROBNP in the last 168 hours.  DDimer No results for input(s): DDIMER in the last 168 hours.   Radiology  DG Abd 1 View Result Date: 03/19/2024 CLINICAL DATA:  Abdominal distension. EXAM: ABDOMEN - 1 VIEW COMPARISON:  Radiographs 03/09/2024.  Chest CT 03/07/2024. FINDINGS: Three supine views of the abdomen are submitted. The enteric tube has been removed. A left femoral line is in place. There is increased moderate to marked gaseous distention of the stomach.  There is gas throughout the small and large bowel which are not significantly distended. Similar small left pleural effusion and associated left basilar airspace disease, probably atelectasis. IMPRESSION: Increased gaseous distention of the stomach following enteric tube removal. This could reflect gastroparesis or ileus. No evidence of bowel obstruction. Electronically Signed   By: Elsie Perone M.D.   On: 03/19/2024 18:54   DG Chest Port 1 View Result Date: 03/19/2024 CLINICAL DATA:  [id number] Acute respiratory failure with hypoxia (HCC) [id number] EXAM: PORTABLE CHEST 1 VIEW COMPARISON:  Radiographs 03/12/2024 and 03/09/2024.  CT 03/07/2024. FINDINGS: 0557 hours. Interval extubation and removal of the enteric tube. Right arm PICC projects to the level of the superior cavoatrial junction. Stable cardiomegaly and aortic atherosclerosis. Interval decreased volume of right pleural effusion with improved aeration of the right lung base. Left pleural effusion and probable left basilar atelectasis have not significantly changed. Stable mild vascular congestion. No pneumothorax or acute osseous abnormality. IMPRESSION: 1. Interval extubation and removal of enteric tube. 2. Interval decreased volume of right pleural effusion with improved aeration of the right lung base. 3. Stable left pleural effusion and probable left basilar atelectasis. Electronically Signed   By: Elsie Perone M.D.   On: 03/19/2024 11:01    Cardiac Studies   Patient Profile   78 y.o. male   Assessment & Plan  Acute on chronic respiratory failure/cardiogenic shock --hospital admission in October 2025 from 9-13th with similar symptoms, respiratory failure due to CHF, discharge weight 286 -Long history of smoking cigars Presenting March 06, 2024 with hypoxia, shortness of breath, hypotension requiring 15 L nonrebreather - Noncompliant with CPAP for 4 years -EF 60% on echo February 02, 2024 -BNP on presentation 16,139 - Started on  empiric antibiotics, Lasix , unable to exclude pneumonia - Extubated November 21, placed on BiPAP -Overnight March 20, 2024 with hypoxia placed back on BiPAP -High risk of aspiration   Chronic renal failure Followed by nephrology, receiving dialysis 1.5 out yesterday   Acute on chronic diastolic CHF Normal EF with severe LVH, restrictive physiology - Lasix  has been transitioned to dialysis    Elevated troponin Up to 200, likely supply/demand mismatch in the setting of respiratory failure - Cardiac catheterization on hold in the setting of encephalopathy, respiratory distress, renal failure   Paroxysmal atrial fibrillation -Noted on telemetry earlier in the admission, heparin  infusion held for mild hemoptysis --Amiodarone  held for bradycardia   [facility] will sign off.  Medication Recommendations: No changes Other recommendations (labs, testing, etc): No further testing at this time Follow up as an outpatient: After recovery   For questions or updates, please contact [facility] Please consult [email or web address] for contact info under     Signed, Etheleen Valtierra, MD  03/20/2024, 11:40 AM

## 2024-03-20 NOTE — Progress Notes (Signed)
 Nutrition Follow-up  DOCUMENTATION CODES:   Obesity unspecified  INTERVENTION:   Recommend dobhoff tube placement and nutrition support   If tube able to be placed, recommend:  Osmolite 1.5@75ml /hr- Initiate at 25ml/hr and increase by 10ml/hr q 8 hours until goal rate is reached  ProSource TF 20- Give 60ml daily via tube, each supplement provides 80kcal and 20g of protein.   Free water  flushes 30ml q4 hours to maintain tube patency   Regimen provides 2780kcal/day, 133g/day protein and 1552ml/day of free water .   Vitamin C 500mg  BID via tube   Zinc  220mg  po daily x 30 days   Rena-vit daily via tube   Pt remains at refeed risk; recommend monitor potassium, magnesium and phosphorus labs daily until stable  Daily weights   NUTRITION DIAGNOSIS:   Increased nutrient needs related to acute illness (CRRT) as evidenced by estimated needs. 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  Pt currently NPO and on bipap. Pt lethargic and with poor appetite and oral intake in hospital; pt eating only sips/bites of meals when able. Pt is now without adequate nutrition for 5 days. Recommend dobhoff tube placement and nutrition support. Pt with gaseous distension noted on KUB; pt with bowel sounds and is having bowel function so low suspicion for ileus. Pt is at high refeed risk. Pt with vitamin deficiencies; will provide supplementation once able. Last HD yesterday. Per chart, pt is down ~39lbs since admission and appears to be under his UBW currently. Pt -10.1L on his I & Os.    Medications reviewed and include: melatonin, midodrine , rena-vit, protonix , levophed   Labs  reviewed: K 4.9 wnl, BUN 41(H), creat 3.47(H), P 7.1(H), Mg 2.3 wnl Folate 8.0 wnl, B12 276 wnl, copper  100 wnl, B6 5.1 wnl, vitamin C 0.2(L), zinc  42(L)- 11/22 Wbc- 17.2(H), Hgb 9.9(L), Hct 31.6(L)  UOP-   Diet Order:   Diet Order             Diet NPO time specified  Diet effective now                  EDUCATION NEEDS:   Not appropriate for education at this time  Skin:  Skin Assessment: Reviewed RN Assessment (blister L buttocks)  Last BM:  11/25- type 7  Height:   Ht Readings from Last 1 Encounters:  03/16/24 5' 9.02 (1.753 m)    Weight:   Wt Readings from Last 1 Encounters:  03/20/24 126.1 kg    Ideal Body Weight:  72.7 kg  BMI:  Body mass index is 41.03 kg/m.  Estimated Nutritional Needs:   Kcal:  2700-3000kcal/day  Protein:  >135g/day  Fluid:  1.8-2.1L/day  Augustin Shams MS, RD, LDN If unable to be reached, please send secure chat to RD inpatient available from 8:00a-4:00p daily

## 2024-03-20 NOTE — Progress Notes (Signed)
 NAME:  Dave Brown, MRN:  [id number], DOB:  1946/01/26, LOS: 12 ADMISSION DATE:  03/08/2024, CONSULTATION DATE:  03/08/2024  Brief Pt Description / Synopsis:  78 y.o. male admitted with Acute on Chronic HFpEF, Cardiogenic/Septic shock, AKI, Acute Hypoxic Respiratory Failure, and MRSA Pneumonia requiring mechanical ventilation and initiation of CRRT.   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 He denied any fevers, chills, chest pain.     Of note, the patient was recently admitted to the hospital from 02/01/2024 to 02/05/2024 for respiratory failure due to CHF.  The patient did not require oxygen  at the time of his discharge.  His discharge weight was 286.  At his last office visit with his primary provider his weight was 291 pounds on 02/22/24.  Follow up with pulmonary on 03/04/24 office weight 29.   He was discharged home with furosemide  40 mg daily during last hospitalization   ED Course: Initial Vital Signs: afebrile and hemodynamically stable. Oxygen  saturation was 93-97% on 4 L  Significant Labs: WBC 11.0, hemoglobin 12.3, platelets 138. Sodium 141, potassium 4.1, bicarbonate 31, serum creatinine 4.15. proBNP 16,139. PCT 0.13. Lactic acid 1.9 >> 0.8. COVID-negative  Imaging Chest X-ray>>patchy opacities right lower lobe and left lower lobe  Medications Administered: furosemide  40 mg IV, Solu-Medrol  125 mg, ceftriaxone , and azithromycin . He was given albuterol  and Atrovent .    [facility] asked to admit for further workup and treatment.  He gradually became hypotensive requiring initiation of low dose Levophed  (3 to 5 mcg), PCCM was consulted.  Nephrology was consulted for oliguria. Case was  discussed with nephrology and it was felt the patient need to be initiated on CRRT. Subsequently, initial request was made to transfer the patient to [facility]. In order to expedite care, he is being transferred to [facility] regional ICU to critical care service where nephrology will be consulted for continued management of his renal failure and fluid overload.   Please see Significant Hospital Events section below for full detailed hospital course.   Pertinent  Medical History   Past Medical History:  Diagnosis Date   CHF (congestive heart failure) (HCC)    Degenerative joint disease    High cholesterol    Hypertension    Kidney stones    Obesity    Respiratory failure with hypoxia (HCC)    Sleep apnea     Micro Data:  11/12: Blood cultures x2>> no growth 11/13: MRSA PCR>> negative 11/13: RVP>> negative 11/24: MRSA PCR>> negative 11/18: Tracheal Aspirate>> MRSA 11/19: MRSA PCR +  Antimicrobials:   Anti-infectives (From admission, onward)    Start     Dose/Rate Route Frequency Ordered Stop   03/17/24 1300  vancomycin  (VANCOCIN ) IVPB 1000 mg/200 mL premix        1,000 mg 200 mL/hr over 60 Minutes Intravenous  Once 03/17/24 1147 03/17/24 1420   03/16/24 1200  vancomycin  (VANCOCIN ) IVPB 1000 mg/200 mL premix        1,000 mg 200 mL/hr over 60 Minutes Intravenous  Once 03/16/24 1104 03/16/24 2100   03/15/24 2200  piperacillin -tazobactam (ZOSYN ) IVPB 3.375 g        3.375 g 12.5 mL/hr over 240 Minutes Intravenous Every 8 hours  03/15/24 1412 03/17/24 0128   03/15/24 1600  vancomycin  (VANCOREADY) IVPB 1500 mg/300 mL        1,500 mg 150 mL/hr over 120 Minutes Intravenous  Once 03/15/24 1411 03/15/24 1705   03/15/24 1411  vancomycin  variable dose per unstable renal function (pharmacist dosing)  Status:  Discontinued         Does not apply See admin instructions 03/15/24 1411 03/19/24 1107   03/15/24 1400  vancomycin  (VANCOREADY) IVPB 1500 mg/300 mL  Status:  Discontinued         1,500 mg 150 mL/hr over 120 Minutes Intravenous Every 24 hours 03/14/24 1139 03/15/24 1411   03/14/24 1200  vancomycin  (VANCOREADY) IVPB 2000 mg/400 mL        2,000 mg 200 mL/hr over 120 Minutes Intravenous  Once 03/14/24 1012 03/14/24 1405   03/12/24 1200  piperacillin -tazobactam (ZOSYN ) IVPB 3.375 g  Status:  Discontinued        3.375 g 100 mL/hr over 30 Minutes Intravenous Every 6 hours 03/12/24 1013 03/15/24 1412        Significant Hospital Events: Including procedures, antibiotic start and stop dates in addition to other pertinent events   11/12: Admitted to [facility] to [place] for treatment of Acute Decompensated HFpEF and AKI. 11/13: BP decreased, started on Levophed .  PCCM consulted.  ABX discontinued. 11/14: Transfer to [facility] for initiation of CRRT.  11/15: seizure like episode, intubated 11/16: Unable to wean; PICC line placed 11/21: Extubated to Bipap. Added Dobutamine .  11/23: Patient remains on bipap this AM. WBC stable at 7.6. H&H Stable. Coox SvO2 67%.  Electrolytes within normal range. NE decreased to 4. Remains on Dobutamin at 5.  11/24: Off BiPAP, awake and alert but remains encephalopathic.  Afebrile, Remains on Levophed , currently 10 mcg, weaning as tolerated.  Creatinine increased to 2.9, but UOP 1.4 L (net - 7.8L), Nephrology holding off on HD today. 11/25: No significant events overnight.  Midodrine  started overnight, now off Levophed .   Precedex  weaned off yesterday, remains encephalopathic but slowly improving. On 4L [place]. Creatinine worsened to 3.6, UOP 675 cc last 24 hrs (net - 8.4L), plan for HD today.  Plan for aggressive pulmonary toilet and mobilization as able.  11/26: Remains critically ill; desatted to the 80s overnight, placed on bipap and developed hypotension requiring initiation of levophed  gtt again. Hypercapnic overnight, will recheck vbg. Nephrology following.  Interim History / Subjective:  As outlined above under Significant Hospital Events  section  Objective   Blood pressure (!) 99/59, pulse 88, temperature 98.3 F (36.8 C), temperature source Axillary, resp. rate (!) 26, height 5' 9.02 (1.753 m), weight 127 kg, SpO2 97%.    FiO2 (%):  [35 %-96 %] 35 %   Intake/Output Summary (Last 24 hours) at 03/20/2024 0830 Last data filed at 03/20/2024 9283 Gross per 24 hour  Intake 157.32 ml  Output 1950 ml  Net -1792.68 ml   Filed Weights   03/17/24 1020 03/18/24 0500 03/19/24 0320  Weight: 128.3 kg 127.1 kg 127 kg    Examination: General: Acute on chronically ill appearing obese male, laying in bed, on [place], in NAD HENT: Atramatic, normocephalic, neck supple, difficult to assess JVD due to body habitus Lungs: Coarse breath sounds throughout, even, nonlabored Cardiovascular: RRR, s1s2, no M/R/G Abdomen: Obese, soft, nontender, slightly distended, no guarding or rebound tenderness, bowel sounds x 4 Extremities: Generalized weakness, normal bulk and tone, warm, trace edema, radial pulses 2+, distal pulses 1+ Neuro: lethargic on bipap, not following  commands, no focal deficits, withdraws from pain, PERRL GU: external male catheter in place  Resolved Hospital Problem list     Assessment & Plan:   #Acute Metabolic Encephalopathy #CO2 Narcosis 11/16 CT Head: negative 11/17 EEG: normal - Provide supportive care - Promote normal sleep/wake cycle and family presence - Avoid sedating medications as able - Control pain with prn fentanyl  - Precedex  as needed ~ weaned off 11/24 - Mobilize as able  - Treat metabolic derangements - Repeat VBG improved  #Acute on Chronic HFpEF #Shock: Cardiogenic +/- Septic ~ Restarted Levophed  11/26 #Paroxsymal A. Fib Echocardiogram 03/16/24: LVEF >75%, grade I DD, RV systolic function normal, RV size normal - Continuous cardiac monitoring - Maintain MAP >65 - Vasopressors as needed to maintain MAP goal ~ weaned off but restarted overnight d/t hypotension following bipap placement -  Continue Midodrine  - Volume removal with HD per nephro - Cardiology following, appreciate input ~ plan for cardiac cath at some point - Continue holding Heparin  gtt due to mild hemoptysis/hematuria   #Acute Hypoxic Respiratory Failure due to ... #Pulmonary Edema #MRSA Pneumonia Intubated 11/15; Extubated 11/21 - Supplemental O2 as needed to maintain O2 sats >92% - BiPAP, weaned to HFNC - Desatted overnight and placed on bipap - Wean FiO2 and PEEP as able; currently on 35% - Follow intermittent Chest X-ray & ABG/VBG as needed - Bronchodilators & Pulmicort  nebs - Completed abx course - Diuresis as BP and renal function permits/Volume removal with HD  #AKI on CKD - Strict I&O - Trend BMP and monitor renal function - Ensure adequate renal perfusion - Avoid nephrotoxic agents as able - ICU electrolyte replacement protocol - Pharmacy to assist with replacement - Nephrology following, appreciate input ~ off CRRT, intermittent HD as per Nephrology   #Suspected Gastroparesis vs Ileus 11/26 Abd Xray: Increased gaseous distention of the stomach following enteric tube removal. This could reflect gastroparesis or ileus. No evidence of bowel obstruction - Continue diet as able; hold feeding while on bipap - Constipation protocol prn - GI ppx: Protonix  40mg   #Diabetes Mellitus Type II - ICU hypo/hyperglycemia protocol - CBG's q4h - Target range of 140 to 180 - SSI  #MRSA Pneumonia - Trend WBC and monitor fever curve - Remains afebrile - WBC slightly more elevated today at 17 - Follow cultures as above - Completed full 8 day course of vancomycin  and 5 day course of zosyn    Best Practice (right click and Reselect all SmartList Selections daily)   Diet/type: Regular consistency (see orders) DVT prophylaxis: SCD GI prophylaxis: PPI Lines: Central line, Dialysis Catheter, and yes and it is still needed Foley:  N/A Code Status:  full code Last date of multidisciplinary goals of  care discussion [11/25]   Labs   CBC: Recent Labs  Lab 03/17/24 0503 03/17/24 1407 03/18/24 0520 03/19/24 0359 03/20/24 0500  WBC 8.7 7.9 10.5 8.7 17.2*  HGB 10.0* 10.3* 9.3* 9.1* 9.9*  HCT 30.6* 32.4* 29.5* 28.5* 31.6*  MCV 99.4 100.0 100.0 100.4* 103.6*  PLT 106* 115* 144* 141* 193    Basic Metabolic Panel: Recent Labs  Lab 03/14/24 0324 03/14/24 1611 03/15/24 0135 03/15/24 0509 03/16/24 0414 03/17/24 0503 03/17/24 1407 03/18/24 0520 03/19/24 0359 03/20/24 0500  NA 135   < > 134*   < > 136 137 135 136 136 135  K 4.1   < > 4.3   < > 4.1 3.9 3.8 4.2 3.8 4.9  CL 101   < > 101   < >  103 103 102 103 102 99  CO2 25   < > 24   < > 23 23 24 23 23 26   GLUCOSE 145*   < > 145*   < > 122* 117* 126* 115* 103* 131*  BUN 14   < > 13   < > 22 29* 23 31* 42* 41*  CREATININE 1.32*   < > 1.29*   < > 2.13* 2.63* 2.21* 2.92* 3.61* 3.47*  CALCIUM  8.7*   < > 8.9   < > 9.6 9.6 9.4 9.6 9.9 9.7  MG 2.3  --  2.4  --  2.4  --   --   --  2.3 2.3  PHOS 2.9   < >  --    < > 2.7 3.8 3.0 4.0 5.0* 7.1*   < > = values in this interval not displayed.   GFR: Estimated Creatinine Clearance: 23.1 mL/min (A) (by C-G formula based on SCr of 3.47 mg/dL (H)). Recent Labs  Lab 03/15/24 0135 03/16/24 0414 03/17/24 1407 03/18/24 0520 03/18/24 1356 03/19/24 0359 03/20/24 0500  WBC 12.2*   < > 7.9 10.5  --  8.7 17.2*  LATICACIDVEN 0.7  --   --   --  0.8  --   --    < > = values in this interval not displayed.    Liver Function Tests: Recent Labs  Lab 03/17/24 0503 03/17/24 1407 03/18/24 0520 03/19/24 0359 03/20/24 0500  ALBUMIN 2.9* 3.0* 3.2* 3.0* 3.3*   No results for input(s): LIPASE, AMYLASE in the last 168 hours. No results for input(s): AMMONIA in the last 168 hours.  ABG    Component Value Date/Time   PHART 7.41 03/16/2024 1412   PCO2ART 41 03/16/2024 1412   PO2ART 167 (H) 03/16/2024 1412   HCO3 28.6 (H) 03/20/2024 0246   ACIDBASEDEF 2.6 (H) 03/20/2024 0246   O2SAT 77.8  03/20/2024 0246     Coagulation Profile: No results for input(s): INR, PROTIME in the last 168 hours.  Cardiac Enzymes: No results for input(s): CKTOTAL, CKMB, CKMBINDEX, TROPONINI in the last 168 hours.  HbA1C: Hgb A1c MFr Bld  Date/Time Value Ref Range Status  03/08/2024 03:11 AM 5.2 4.8 - 5.6 % Final    Comment:    (NOTE) Diagnosis of Diabetes The following HbA1c ranges recommended by the American Diabetes Association (ADA) may be used as an aid in the diagnosis of diabetes mellitus.  Hemoglobin             Suggested A1C NGSP%              Diagnosis  <5.7                   Non Diabetic  5.7-6.4                Pre-Diabetic  >6.4                   Diabetic  <7.0                   Glycemic control for                       adults with diabetes.    05/02/2020 02:37 PM 6.9 (H) 4.8 - 5.6 % Final    Comment:    (NOTE) Pre diabetes:          5.7%-6.4%  Diabetes:              >  6.4%  Glycemic control for   <7.0% adults with diabetes     CBG: Recent Labs  Lab 03/17/24 2336 03/18/24 0515 03/18/24 0732 03/18/24 1115 03/18/24 1533  GLUCAP 85 113* 153* 117* 108*    Review of Systems:   Unable to assess due to AMS   Past Medical History:  He,  has a past medical history of CHF (congestive heart failure) (HCC), Degenerative joint disease, High cholesterol, Hypertension, Kidney stones, Obesity, Respiratory failure with hypoxia (HCC), and Sleep apnea.   Surgical History:  History reviewed. No pertinent surgical history.   Social History:   reports that he quit smoking about 25 years ago. His smoking use included cigars. He does not have any smokeless tobacco history on file. He reports that he does not drink alcohol and does not use drugs.   Family History:  His family history is not on file.   Allergies No Known Allergies   Home Medications  Prior to Admission medications   Medication Sig Start Date End Date Taking? Authorizing Provider   albuterol  (PROVENTIL ) (2.5 MG/3ML) 0.083% nebulizer solution Take 2.5 mg by nebulization every 6 (six) hours as needed for shortness of breath or wheezing. 03/04/24  Yes [provider]  hydrocortisone  sodium succinate  (SOLU-CORTEF ) 100 MG injection Inject 2 mLs (100 mg total) into the vein every 8 (eight) hours. 03/08/24  Yes Tat, Alm, MD  midodrine  (PROAMATINE ) 10 MG tablet Take 1 tablet (10 mg total) by mouth 3 (three) times daily with meals. 03/08/24  Yes Tat, Alm, MD  norepinephrine  (LEVOPHED ) 4-5 MG/250ML-% SOLN Inject 0-10 mcg/min into the vein continuous. 03/08/24  Yes Tat, Alm, MD  rosuvastatin  (CRESTOR ) 10 MG tablet Take 10 mg by mouth daily.   Yes [provider]  sertraline  (ZOLOFT ) 25 MG tablet Take 25 mg by mouth in the morning.   Yes [provider]  TYLENOL  325 MG tablet Take 325-650 mg by mouth every 8 (eight) hours as needed for mild pain (pain score 1-3) (or headaches).   Yes [provider]     Critical care time: 50 minutes     Coltin Casher, PA-C [facility] PCCM Team Contact Info: [phone]

## 2024-03-20 NOTE — Progress Notes (Signed)
 Unable to instill alteplase  to white port of the TL R PICC due to complete occlusion. RN made aware.

## 2024-03-20 NOTE — Progress Notes (Signed)
 [facility] [place], [place] 03/20/24  Subjective:   Hospital day # 38 78 year old male with hypertension, heart failure with preserved ejection fraction, hyperlipidemia, stage IV CKD, obstructive sleep apnea, MGUS, chronic respiratory failure on 2 L oxygen  presented from [facility] with shortness of breath.  Transferred to [facility] for need of continuous dialysis.  Update:  Patient sitting up in bed Alert Bipap in place  Objective:  Vital signs in last 24 hours:  Temp:  [98 F (36.7 C)-98.7 F (37.1 C)] 98.3 F (36.8 C) (11/26 0711) Pulse Rate:  [88-100] 91 (11/26 0957) Resp:  [19-29] 26 (11/26 0957) BP: (87-130)/(41-115) 96/54 (11/26 0900) SpO2:  [86 %-100 %] 96 % (11/26 0957) FiO2 (%):  [35 %-96 %] 35 % (11/26 0719) Weight:  [126.1 kg] 126.1 kg (11/26 0800)  Weight change:  Filed Weights   03/18/24 0500 03/19/24 0320 03/20/24 0800  Weight: 127.1 kg 127 kg 126.1 kg    Intake/Output:    Intake/Output Summary (Last 24 hours) at 03/20/2024 1029 Last data filed at 03/20/2024 0934 Gross per 24 hour  Intake 120.41 ml  Output 1950 ml  Net -1829.59 ml     Physical Exam: General: Elderly  Pulm/lungs Scattered rhonchi, Bipap  CVS/Heart S1S2 no rubs  Abdomen:  Distended, nontender  Extremities: Dependent edema is present  Neurologic: Alert, hard of hearing  Skin: Warm, dry  Access: Left femoral temporary dialysis catheter       Basic Metabolic Panel:  Recent Labs  Lab 03/14/24 0324 03/14/24 1611 03/15/24 0135 03/15/24 0509 03/16/24 0414 03/17/24 0503 03/17/24 1407 03/18/24 0520 03/19/24 0359 03/20/24 0500  NA 135   < > 134*   < > 136 137 135 136 136 135  K 4.1   < > 4.3   < > 4.1 3.9 3.8 4.2 3.8 4.9  CL 101   < > 101   < > 103 103 102 103 102 99  CO2 25   < > 24   < > 23 23 24 23 23 26   GLUCOSE 145*   < > 145*   < > 122* 117* 126* 115* 103* 131*  BUN 14   < > 13   < > 22 29* 23 31* 42* 41*  CREATININE 1.32*   < > 1.29*   < >  2.13* 2.63* 2.21* 2.92* 3.61* 3.47*  CALCIUM  8.7*   < > 8.9   < > 9.6 9.6 9.4 9.6 9.9 9.7  MG 2.3  --  2.4  --  2.4  --   --   --  2.3 2.3  PHOS 2.9   < >  --    < > 2.7 3.8 3.0 4.0 5.0* 7.1*   < > = values in this interval not displayed.     CBC: Recent Labs  Lab 03/17/24 0503 03/17/24 1407 03/18/24 0520 03/19/24 0359 03/20/24 0500  WBC 8.7 7.9 10.5 8.7 17.2*  HGB 10.0* 10.3* 9.3* 9.1* 9.9*  HCT 30.6* 32.4* 29.5* 28.5* 31.6*  MCV 99.4 100.0 100.0 100.4* 103.6*  PLT 106* 115* 144* 141* 193      Lab Results  Component Value Date   HEPBSAG NON REACTIVE 03/16/2024      Microbiology:  Recent Results (from the past 240 hours)  Culture, Respiratory w Gram Stain     Status: None   Collection Time: 03/12/24  8:21 AM   Specimen: Tracheal Aspirate; Respiratory  Result Value Ref Range Status   Specimen Description  Final    TRACHEAL ASPIRATE Performed at [facility], [address] [place], [place] [address]    Special Requests   Final    NONE Performed at [facility], [address] [place], [place] [address]    Gram Stain   Final    RARE WBC PRESENT,BOTH PMN AND MONONUCLEAR FEW GRAM POSITIVE RODS    Culture   Final    MODERATE METHICILLIN RESISTANT STAPHYLOCOCCUS AUREUS WITHIN MIXED FLORA Performed at [facility], [address], [place], [place] [address]    Report Status 03/15/2024 FINAL  Final   Organism ID, Bacteria METHICILLIN RESISTANT STAPHYLOCOCCUS AUREUS  Final      Susceptibility   Methicillin resistant staphylococcus aureus - MIC*    CIPROFLOXACIN >=8 RESISTANT Resistant     ERYTHROMYCIN >=8 RESISTANT Resistant     GENTAMICIN <=0.5 SENSITIVE Sensitive     OXACILLIN >=4 RESISTANT Resistant     TETRACYCLINE <=1 SENSITIVE Sensitive     VANCOMYCIN  1 SENSITIVE Sensitive     TRIMETH/SULFA >=320 RESISTANT Resistant     CLINDAMYCIN <=0.25 SENSITIVE Sensitive     RIFAMPIN <=0.5 SENSITIVE Sensitive     Inducible Clindamycin  NEGATIVE Sensitive     LINEZOLID  2 SENSITIVE Sensitive     * MODERATE METHICILLIN RESISTANT STAPHYLOCOCCUS AUREUS  MRSA Next Gen by PCR, Nasal     Status: Abnormal   Collection Time: 03/13/24 11:57 AM   Specimen: Nasal Mucosa; Nasal Swab  Result Value Ref Range Status   MRSA by PCR Next Gen DETECTED (A) NOT DETECTED Final    Comment: RESULT CALLED TO, READ BACK BY AND VERIFIED WITH: JORGE MORALES AT 1326 ON 03/13/24 BY SS (NOTE) The GeneXpert MRSA Assay (FDA approved for NASAL specimens only), is one component of a comprehensive MRSA colonization surveillance program. It is not intended to diagnose MRSA infection nor to guide or monitor treatment for MRSA infections. Test performance is not FDA approved in patients less than 81 years old. Performed at [facility], [address] [place], [place] [address]     Coagulation Studies: No results for input(s): LABPROT, INR in the last 72 hours.   Urinalysis: No results for input(s): COLORURINE, LABSPEC, PHURINE, GLUCOSEU, HGBUR, BILIRUBINUR, KETONESUR, PROTEINUR, UROBILINOGEN, NITRITE, LEUKOCYTESUR in the last 72 hours.  Invalid input(s): APPERANCEUR    Imaging: DG Abd 1 View Result Date: 03/19/2024 CLINICAL DATA:  Abdominal distension. EXAM: ABDOMEN - 1 VIEW COMPARISON:  Radiographs 03/09/2024.  Chest CT 03/07/2024. FINDINGS: Three supine views of the abdomen are submitted. The enteric tube has been removed. A left femoral line is in place. There is increased moderate to marked gaseous distention of the stomach. There is gas throughout the small and large bowel which are not significantly distended. Similar small left pleural effusion and associated left basilar airspace disease, probably atelectasis. IMPRESSION: Increased gaseous distention of the stomach following enteric tube removal. This could reflect gastroparesis or ileus. No evidence of bowel obstruction. Electronically Signed   By: Elsie Perone M.D.   On: 03/19/2024 18:54   DG Chest Port 1 View Result Date: 03/19/2024 CLINICAL DATA:  [id number] Acute respiratory failure with hypoxia (HCC) [id number] EXAM: PORTABLE CHEST 1 VIEW COMPARISON:  Radiographs 03/12/2024 and 03/09/2024.  CT 03/07/2024. FINDINGS: 0557 hours. Interval extubation and removal of the enteric tube. Right arm PICC projects to the level of the superior cavoatrial junction. Stable cardiomegaly and aortic atherosclerosis. Interval decreased volume of right pleural effusion with improved aeration of the  right lung base. Left pleural effusion and probable left basilar atelectasis have not significantly changed. Stable mild vascular congestion. No pneumothorax or acute osseous abnormality. IMPRESSION: 1. Interval extubation and removal of enteric tube. 2. Interval decreased volume of right pleural effusion with improved aeration of the right lung base. 3. Stable left pleural effusion and probable left basilar atelectasis. Electronically Signed   By: Elsie Perone M.D.   On: 03/19/2024 11:01       Medications:    anticoagulant sodium citrate      heparin  Stopped (03/17/24 1749)   norepinephrine  (LEVOPHED ) Adult infusion 4 mcg/min (03/20/24 0934)    budesonide  (PULMICORT ) nebulizer solution  0.25 mg Nebulization BID   Chlorhexidine  Gluconate Cloth  6 each Topical Q0600   ipratropium-albuterol   3 mL Nebulization TID   melatonin  2.5 mg Oral QHS   midodrine   5 mg Oral TID WC   multivitamin  1 tablet Oral QHS   nystatin   5 mL Oral QID   mouth rinse  15 mL Mouth Rinse 4 times per day   pantoprazole  (PROTONIX ) IV  40 mg Intravenous QHS   Ensure Max Protein  11 oz Oral TID   sertraline   25 mg Per Tube Daily   alteplase , anticoagulant sodium citrate , diazepam , docusate sodium , fentaNYL  (SUBLIMAZE ) injection, guaiFENesin -dextromethorphan , heparin , ipratropium-albuterol , ondansetron  (ZOFRAN ) IV, mouth rinse, phenol, polyethylene glycol, sodium chloride  flush,  traZODone   Assessment/ Plan:  78 y.o. 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  Baseline creatinine of 2.5-2.8. Acute kidney injury is thought to be secondary to pneumonia and circulatory shock causing ATN.  Patient responded well to IV Lasix  03/13/24.  However due to low CVP, further doses were held. CRRT stopped 11/22. First HD 11/23.  Update:  Dialysis received yesterday with UF 1.5L achieved. Will perform additional dialysis today, UF only to remove fluid. Patient will need permcath prior to discharge, will consult vascular once stable. Next treatment scheduled for Friday.   Lab Results  Component Value Date   CREATININE 3.47 (H) 03/20/2024   CREATININE 3.61 (H) 03/19/2024   CREATININE 2.92 (H) 03/18/2024     Intake/Output Summary (Last 24 hours) at 03/20/2024 1029 Last data filed at 03/20/2024 0934 Gross per 24 hour  Intake 120.41 ml  Output 1950 ml  Net -1829.59 ml     Acute respiratory failure Respiratory pathogen's panel by PCR is negative.  Blood cultures from 03/06/2024 are negative. Bipap in place  Anemia of CKD.  Hemoglobin 9.9.     LOS: 12 [facility] 11/26/202510:29 AM  [address] [place], [place] [phone]

## 2024-03-20 NOTE — Plan of Care (Signed)
  Problem: Education: Goal: Knowledge of General Education information will improve Description: Including pain rating scale, medication(s)/side effects and non-pharmacologic comfort measures Outcome: Progressing   Problem: Health Behavior/Discharge Planning: Goal: Ability to manage health-related needs will improve Outcome: Progressing   Problem: Clinical Measurements: Goal: Ability to maintain clinical measurements within normal limits will improve Outcome: Progressing Goal: Will remain free from infection Outcome: Progressing Goal: Diagnostic test results will improve Outcome: Progressing Goal: Respiratory complications will improve Outcome: Progressing Goal: Cardiovascular complication will be avoided Outcome: Progressing   Problem: Activity: Goal: Risk for activity intolerance will decrease Outcome: Progressing   Problem: Nutrition: Goal: Adequate nutrition will be maintained Outcome: Progressing   Problem: Coping: Goal: Level of anxiety will decrease Outcome: Progressing   Problem: Elimination: Goal: Will not experience complications related to bowel motility Outcome: Progressing Goal: Will not experience complications related to urinary retention Outcome: Progressing   Problem: Pain Managment: Goal: General experience of comfort will improve and/or be controlled Outcome: Progressing   Problem: Safety: Goal: Ability to remain free from injury will improve Outcome: Progressing   Problem: Skin Integrity: Goal: Risk for impaired skin integrity will decrease Outcome: Progressing   Problem: Education: Goal: Ability to describe self-care measures that may prevent or decrease complications (Diabetes Survival Skills Education) will improve Outcome: Progressing Goal: Individualized Educational Video(s) Outcome: Progressing   Problem: Coping: Goal: Ability to adjust to condition or change in health will improve Outcome: Progressing   Problem: Fluid  Volume: Goal: Ability to maintain a balanced intake and output will improve Outcome: Progressing   Problem: Health Behavior/Discharge Planning: Goal: Ability to identify and utilize available resources and services will improve Outcome: Progressing Goal: Ability to manage health-related needs will improve Outcome: Progressing   Problem: Metabolic: Goal: Ability to maintain appropriate glucose levels will improve Outcome: Progressing   Problem: Nutritional: Goal: Maintenance of adequate nutrition will improve Outcome: Progressing Goal: Progress toward achieving an optimal weight will improve Outcome: Progressing   Problem: Skin Integrity: Goal: Risk for impaired skin integrity will decrease Outcome: Progressing   Problem: Tissue Perfusion: Goal: Adequacy of tissue perfusion will improve Outcome: Progressing   Problem: Education: Goal: Knowledge of disease and its progression will improve Outcome: Progressing   Problem: Health Behavior/Discharge Planning: Goal: Ability to manage health-related needs will improve Outcome: Progressing   Problem: Clinical Measurements: Goal: Complications related to the disease process or treatment will be avoided or minimized Outcome: Progressing Goal: Dialysis access will remain free of complications Outcome: Progressing   Problem: Activity: Goal: Activity intolerance will improve Outcome: Progressing   Problem: Fluid Volume: Goal: Fluid volume balance will be maintained or improved Outcome: Progressing   Problem: Nutritional: Goal: Ability to make appropriate dietary choices will improve Outcome: Progressing   Problem: Respiratory: Goal: Respiratory symptoms related to disease process will be avoided Outcome: Progressing   Problem: Self-Concept: Goal: Body image disturbance will be avoided or minimized Outcome: Progressing

## 2024-03-20 NOTE — Progress Notes (Signed)
 OT Cancellation Note  Patient Details Name: Kyshon Tolliver MRN: [id number] DOB: 1945-07-14   Cancelled Treatment:    Reason Eval/Treat Not Completed: Medical issues which prohibited therapy. Per conversation with RN pt is not appropriate for therapy at this time. Pt on BiPAP and receiving HD for the second time this date. Pt will re attempt skilled OT session on next available date/time.   Larraine Colas M.S. OTR/L  03/20/24, 4:14 PM

## 2024-03-21 ENCOUNTER — Inpatient Hospital Stay

## 2024-03-21 LAB — GLUCOSE, CAPILLARY
Glucose-Capillary: 105 mg/dL — ABNORMAL HIGH (ref 70–99)
Glucose-Capillary: 126 mg/dL — ABNORMAL HIGH (ref 70–99)
Glucose-Capillary: 133 mg/dL — ABNORMAL HIGH (ref 70–99)
Glucose-Capillary: 136 mg/dL — ABNORMAL HIGH (ref 70–99)
Glucose-Capillary: 86 mg/dL (ref 70–99)
Glucose-Capillary: 96 mg/dL (ref 70–99)

## 2024-03-21 LAB — RENAL FUNCTION PANEL
Albumin: 3.2 g/dL — ABNORMAL LOW (ref 3.5–5.0)
Albumin: 3 g/dL — ABNORMAL LOW (ref 3.5–5.0)
Anion gap: 11 (ref 5–15)
Anion gap: 9 (ref 5–15)
BUN: 43 mg/dL — ABNORMAL HIGH (ref 8–23)
BUN: 37 mg/dL — ABNORMAL HIGH (ref 8–23)
CO2: 24 mmol/L (ref 22–32)
CO2: 26 mmol/L (ref 22–32)
Calcium: 9.2 mg/dL (ref 8.9–10.3)
Calcium: 9 mg/dL (ref 8.9–10.3)
Chloride: 102 mmol/L (ref 98–111)
Chloride: 102 mmol/L (ref 98–111)
Creatinine, Ser: 3.22 mg/dL — ABNORMAL HIGH (ref 0.61–1.24)
Creatinine, Ser: 2.87 mg/dL — ABNORMAL HIGH (ref 0.61–1.24)
GFR, Estimated: 19 mL/min — ABNORMAL LOW (ref >60)
GFR, Estimated: 22 mL/min — ABNORMAL LOW (ref >60)
Glucose, Bld: 136 mg/dL — ABNORMAL HIGH (ref 70–99)
Glucose, Bld: 99 mg/dL (ref 70–99)
Phosphorus: 5.4 mg/dL — ABNORMAL HIGH (ref 2.5–4.6)
Phosphorus: 4 mg/dL (ref 2.5–4.6)
Potassium: 4.6 mmol/L (ref 3.5–5.1)
Potassium: 4.2 mmol/L (ref 3.5–5.1)
Sodium: 137 mmol/L (ref 135–145)
Sodium: 137 mmol/L (ref 135–145)

## 2024-03-21 LAB — CBC
HCT: 29.6 % — ABNORMAL LOW (ref 39.0–52.0)
Hemoglobin: 9.3 g/dL — ABNORMAL LOW (ref 13.0–17.0)
MCH: 32.1 pg (ref 26.0–34.0)
MCHC: 31.4 g/dL (ref 30.0–36.0)
MCV: 102.1 fL — ABNORMAL HIGH (ref 80.0–100.0)
Platelets: 185 K/uL (ref 150–400)
RBC: 2.9 MIL/uL — ABNORMAL LOW (ref 4.22–5.81)
RDW: 14.3 % (ref 11.5–15.5)
WBC: 18.7 K/uL — ABNORMAL HIGH (ref 4.0–10.5)
nRBC: 0 % (ref 0.0–0.2)

## 2024-03-21 LAB — EXPECTORATED SPUTUM ASSESSMENT W GRAM STAIN, RFLX TO RESP C

## 2024-03-21 LAB — MAGNESIUM: Magnesium: 2.2 mg/dL (ref 1.7–2.4)

## 2024-03-21 LAB — LACTIC ACID, PLASMA: Lactic Acid, Venous: 1.5 mmol/L (ref 0.5–1.9)

## 2024-03-21 MED ORDER — IPRATROPIUM-ALBUTEROL 0.5-2.5 (3) MG/3ML IN SOLN: 3.0000 mL | Freq: Two times a day (BID) | RESPIRATORY_TRACT | Status: DC

## 2024-03-21 MED ORDER — STERILE WATER FOR INJECTION IJ SOLN
INTRAMUSCULAR | Status: AC
  Filled 2024-03-21: qty 10

## 2024-03-21 MED ORDER — IPRATROPIUM-ALBUTEROL 0.5-2.5 (3) MG/3ML IN SOLN
3.0000 mL | Freq: Two times a day (BID) | RESPIRATORY_TRACT | Status: DC
  Administered 2024-03-21 – 2024-03-23 (×4): 3 mL via RESPIRATORY_TRACT
  Filled 2024-03-21 (×4): qty 3

## 2024-03-21 MED ORDER — LINEZOLID 600 MG/300ML IV SOLN
600.0000 mg | Freq: Two times a day (BID) | INTRAVENOUS | Status: AC
  Administered 2024-03-21 – 2024-03-25 (×10): 600 mg via INTRAVENOUS
  Filled 2024-03-21 (×10): qty 300

## 2024-03-21 NOTE — Progress Notes (Signed)
 1430- CRRT paused for filter change 1515- Filter pressure high and blue lumen not pulling back blood.  1530- Blood returned to patient and CRRT paused. IV team consult placed for access and MD notified of frequent filter changes.  1630-Dr. Lateef gave verbal orders to change pre and post fluid rate to 625mL/hr.  1730- CRRT restarted and filter changed.

## 2024-03-21 NOTE — Plan of Care (Signed)

## 2024-03-21 NOTE — Progress Notes (Signed)
 NAME:  Dave Brown, MRN:  [id number], DOB:  Aug 24, 1945, LOS: 13 ADMISSION DATE:  03/08/2024, CONSULTATION DATE:  03/08/2024  Brief Pt Description / Synopsis:  78 y.o. male admitted with Acute on Chronic HFpEF, Cardiogenic/Septic shock, AKI, Acute Hypoxic Respiratory Failure, and MRSA Pneumonia requiring mechanical ventilation and initiation of CRRT.   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 He denied any fevers, chills, chest pain.     Of note, the patient was recently admitted to the hospital from 02/01/2024 to 02/05/2024 for respiratory failure due to CHF.  The patient did not require oxygen  at the time of his discharge.  His discharge weight was 286.  At his last office visit with his primary provider his weight was 291 pounds on 02/22/24.  Follow up with pulmonary on 03/04/24 office weight 29.   He was discharged home with furosemide  40 mg daily during last hospitalization   ED Course: Initial Vital Signs: afebrile and hemodynamically stable. Oxygen  saturation was 93-97% on 4 L  Significant Labs: WBC 11.0, hemoglobin 12.3, platelets 138. Sodium 141, potassium 4.1, bicarbonate 31, serum creatinine 4.15. proBNP 16,139. PCT 0.13. Lactic acid 1.9 >> 0.8. COVID-negative  Imaging Chest X-ray>>patchy opacities right lower lobe and left lower lobe  Medications Administered: furosemide  40 mg IV, Solu-Medrol  125 mg, ceftriaxone , and azithromycin . He was given albuterol  and Atrovent .    [facility] asked to admit for further workup and treatment.  He gradually became hypotensive requiring initiation of low dose Levophed  (3 to 5 mcg), PCCM was consulted.  Nephrology was consulted for oliguria. Case was  discussed with nephrology and it was felt the patient need to be initiated on CRRT. Subsequently, initial request was made to transfer the patient to [facility]. In order to expedite care, he is being transferred to [facility] regional ICU to critical care service where nephrology will be consulted for continued management of his renal failure and fluid overload.   Please see Significant Hospital Events section below for full detailed hospital course.   Pertinent  Medical History   Past Medical History:  Diagnosis Date   CHF (congestive heart failure) (HCC)    Degenerative joint disease    High cholesterol    Hypertension    Kidney stones    Obesity    Respiratory failure with hypoxia (HCC)    Sleep apnea     Micro Data:  11/12: Blood cultures x2>> no growth 11/13: MRSA PCR>> negative 11/13: RVP>> negative 11/24: MRSA PCR>> negative 11/18: Tracheal Aspirate>> MRSA 11/19: MRSA PCR + 11/27: Sputum>>  Antimicrobials:   Anti-infectives (From admission, onward)    Start     Dose/Rate Route Frequency Ordered Stop   03/17/24 1300  vancomycin  (VANCOCIN ) IVPB 1000 mg/200 mL premix        1,000 mg 200 mL/hr over 60 Minutes Intravenous  Once 03/17/24 1147 03/17/24 1420   03/16/24 1200  vancomycin  (VANCOCIN ) IVPB 1000 mg/200 mL premix        1,000 mg 200 mL/hr over 60 Minutes Intravenous  Once 03/16/24 1104 03/16/24 2100   03/15/24 2200  piperacillin -tazobactam (ZOSYN ) IVPB 3.375 g        3.375 g 12.5 mL/hr over 240 Minutes Intravenous Every  8 hours 03/15/24 1412 03/17/24 0128   03/15/24 1600  vancomycin  (VANCOREADY) IVPB 1500 mg/300 mL        1,500 mg 150 mL/hr over 120 Minutes Intravenous  Once 03/15/24 1411 03/15/24 1705   03/15/24 1411  vancomycin  variable dose per unstable renal function (pharmacist dosing)  Status:  Discontinued         Does not apply See admin instructions 03/15/24 1411 03/19/24 1107   03/15/24 1400  vancomycin  (VANCOREADY) IVPB 1500 mg/300 mL  Status:   Discontinued        1,500 mg 150 mL/hr over 120 Minutes Intravenous Every 24 hours 03/14/24 1139 03/15/24 1411   03/14/24 1200  vancomycin  (VANCOREADY) IVPB 2000 mg/400 mL        2,000 mg 200 mL/hr over 120 Minutes Intravenous  Once 03/14/24 1012 03/14/24 1405   03/12/24 1200  piperacillin -tazobactam (ZOSYN ) IVPB 3.375 g  Status:  Discontinued        3.375 g 100 mL/hr over 30 Minutes Intravenous Every 6 hours 03/12/24 1013 03/15/24 1412        Significant Hospital Events: Including procedures, antibiotic start and stop dates in addition to other pertinent events   11/12: Admitted to [facility] to [place] for treatment of Acute Decompensated HFpEF and AKI. 11/13: BP decreased, started on Levophed .  PCCM consulted.  ABX discontinued. 11/14: Transfer to [facility] for initiation of CRRT.  11/15: seizure like episode, intubated 11/16: Unable to wean; PICC line placed 11/21: Extubated to Bipap. Added Dobutamine .  11/23: Patient remains on bipap this AM. WBC stable at 7.6. H&H Stable. Coox SvO2 67%.  Electrolytes within normal range. NE decreased to 4. Remains on Dobutamin at 5.  11/24: Off BiPAP, awake and alert but remains encephalopathic.  Afebrile, Remains on Levophed , currently 10 mcg, weaning as tolerated.  Creatinine increased to 2.9, but UOP 1.4 L (net - 7.8L), Nephrology holding off on HD today. 11/25: No significant events overnight.  Midodrine  started overnight, now off Levophed .   Precedex  weaned off yesterday, remains encephalopathic but slowly improving. On 4L [place]. Creatinine worsened to 3.6, UOP 675 cc last 24 hrs (net - 8.4L), plan for HD today.  Plan for aggressive pulmonary toilet and mobilization as able.  11/26: Remains critically ill; desatted to the 80s overnight, placed on bipap and developed hypotension requiring initiation of levophed  gtt again. Hypercapnic overnight, will recheck vbg. Nephrology following, placed back on CRRT.  11/27: No acute events overnight, afebrile, remains on  Levophed  and CRRT.  Worsening Leukocytosis, obtain Sputum culture and start Linezolid .  Weaned off BiPAP to [place].  Interim History / Subjective:  As outlined above under Significant Hospital Events section  Objective   Blood pressure 116/65, pulse 72, temperature 98.1 F (36.7 C), temperature source Axillary, resp. rate 20, height 5' 9.02 (1.753 m), weight 124.2 kg, SpO2 100%.    FiO2 (%):  [35 %] 35 % Pressure Support:  [10 cmH20] 10 cmH20   Intake/Output Summary (Last 24 hours) at 03/21/2024 0740 Last data filed at 03/21/2024 0700 Gross per 24 hour  Intake 279.2 ml  Output 490 ml  Net -210.8 ml   Filed Weights   03/20/24 1413 03/20/24 1507 03/21/24 0500  Weight: 128.5 kg 128.2 kg 124.2 kg    Examination: General: Acute on chronically ill appearing obese male, laying in bed, on BiPAP, in NAD HENT: Atramatic, normocephalic, neck supple, difficult to assess JVD due to body habitus Lungs: Distant clear breath sounds throughout, even, nonlabored Cardiovascular: RRR, s1s2, no  M/R/G Abdomen: Obese, soft, nontender, slightly distended and slightly taught, no guarding or rebound tenderness, BS+ x4 Extremities: Generalized weakness, normal bulk and tone, warm, trace edema Neuro: Awake and alert, oriented to self place and situation, moves all extremities purposefully, no focal deficits noted GU: external male catheter in place  Resolved Hospital Problem list     Assessment & Plan:   #Acute on Chronic HFpEF #Shock: Cardiogenic +/- Septic  #Paroxsymal A. Fib -Echocardiogram 03/16/24: LVEF >75%, grade I DD, RV systolic function normal, RV size normal -Continuous cardiac monitoring -Maintain MAP >65 -Vasopressors as needed to maintain MAP goal  -Continue Midodrine  -Check Coox and Lactic  -Volume removal with HD/CRRT -Cardiology following, appreciate input ~ plan for cardiac cath at some point -Holding Heparin  gtt due to mild hemoptysis/hematuria   #Acute Hypoxic Respiratory  Failure due to ... #Pulmonary Edema #MRSA Pneumonia ~ TREATED  Intubated 11/15; Extubated 11/21 -Supplemental O2 as needed to maintain O2 sats >92% -BiPAP, wean as tolerated -Follow intermittent Chest X-ray & ABG as needed -Bronchodilators & Pulmicort  nebs -ABX as above  -Volume removal with HD/CRRT -Pulmonary toilet as able  #MRSA Pneumonia ~ TREATED #Leukocytosis  -Monitor fever curve -Trend WBC's & Procalcitonin -Follow cultures as above -Start empiric Linezolid  on 11/27 pending cultures & sensitivities ~ previously completed 8 days of Vancomycin , however after Vancomycin  completed, pt with worsening respiratory status, worsening leukocytosis, and hypotensive requiring Levophed  therefore resuming MRSA coverage with Linezolid    #AKI on CKD -Monitor I&O's / urinary output -Follow BMP -Ensure adequate renal perfusion -Avoid nephrotoxic agents as able -Replace electrolytes as indicated ~ Pharmacy following for assistance with electrolyte replacement -Nephrology following, appreciate input ~ continue CRRT as per Nephrology   #Diabetes Mellitus Type II -CBG's q4h; Target range of 140 to 180 -SSI -Follow ICU Hypo/Hyperglycemia protocol  #Acute Metabolic Encephalopathy CT Head negative, EEG normal -Treatment of metabolic derangements as outlined above -Provide supportive care -Promote normal sleep/wake cycle and family presence -Avoid sedating medications as able -Precedex  as needed ~ weaned off 11/24 -Mobilize as able      [employer] (right click and Reselect all SmartList Selections daily)   Diet/type: Regular consistency (see orders) DVT prophylaxis: SCD GI prophylaxis: PPI Lines: Central line, Dialysis Catheter, and yes and it is still needed Foley:  N/A Code Status:  full code Last date of multidisciplinary goals of care discussion [11/25]  11/25: Will update pt's family when they arrive at bedside on plan of care.  Labs   CBC: Recent Labs  Lab  03/17/24 1407 03/18/24 0520 03/19/24 0359 03/20/24 0500 03/21/24 0413  WBC 7.9 10.5 8.7 17.2* 18.7*  HGB 10.3* 9.3* 9.1* 9.9* 9.3*  HCT 32.4* 29.5* 28.5* 31.6* 29.6*  MCV 100.0 100.0 100.4* 103.6* 102.1*  PLT 115* 144* 141* 193 185    Basic Metabolic Panel: Recent Labs  Lab 03/15/24 0135 03/15/24 0509 03/16/24 0414 03/17/24 0503 03/17/24 1407 03/18/24 0520 03/19/24 0359 03/20/24 0500 03/21/24 0413  NA 134*   < > 136   < > 135 136 136 135 137  K 4.3   < > 4.1   < > 3.8 4.2 3.8 4.9 4.6  CL 101   < > 103   < > 102 103 102 99 102  CO2 24   < > 23   < > 24 23 23 26 24   GLUCOSE 145*   < > 122*   < > 126* 115* 103* 131* 136*  BUN 13   < > 22   < >  23 31* 42* 41* 43*  CREATININE 1.29*   < > 2.13*   < > 2.21* 2.92* 3.61* 3.47* 3.22*  CALCIUM  8.9   < > 9.6   < > 9.4 9.6 9.9 9.7 9.2  MG 2.4  --  2.4  --   --   --  2.3 2.3 2.2  PHOS  --    < > 2.7   < > 3.0 4.0 5.0* 7.1* 5.4*   < > = values in this interval not displayed.   GFR: Estimated Creatinine Clearance: 24.6 mL/min (A) (by C-G formula based on SCr of 3.22 mg/dL (H)). Recent Labs  Lab 03/15/24 0135 03/16/24 0414 03/18/24 0520 03/18/24 1356 03/19/24 0359 03/20/24 0500 03/21/24 0413  WBC 12.2*   < > 10.5  --  8.7 17.2* 18.7*  LATICACIDVEN 0.7  --   --  0.8  --   --   --    < > = values in this interval not displayed.    Liver Function Tests: Recent Labs  Lab 03/17/24 1407 03/18/24 0520 03/19/24 0359 03/20/24 0500 03/21/24 0413  ALBUMIN 3.0* 3.2* 3.0* 3.3* 3.2*   No results for input(s): LIPASE, AMYLASE in the last 168 hours. No results for input(s): AMMONIA in the last 168 hours.  ABG    Component Value Date/Time   PHART 7.41 03/16/2024 1412   PCO2ART 41 03/16/2024 1412   PO2ART 167 (H) 03/16/2024 1412   HCO3 25.4 03/20/2024 1210   ACIDBASEDEF 3.9 (H) 03/20/2024 1210   O2SAT 74.7 03/20/2024 1210     Coagulation Profile: No results for input(s): INR, PROTIME in the last 168  hours.  Cardiac Enzymes: No results for input(s): CKTOTAL, CKMB, CKMBINDEX, TROPONINI in the last 168 hours.  HbA1C: Hgb A1c MFr Bld  Date/Time Value Ref Range Status  03/08/2024 03:11 AM 5.2 4.8 - 5.6 % Final    Comment:    (NOTE) Diagnosis of Diabetes The following HbA1c ranges recommended by the American Diabetes Association (ADA) may be used as an aid in the diagnosis of diabetes mellitus.  Hemoglobin             Suggested A1C NGSP%              Diagnosis  <5.7                   Non Diabetic  5.7-6.4                Pre-Diabetic  >6.4                   Diabetic  <7.0                   Glycemic control for                       adults with diabetes.    05/02/2020 02:37 PM 6.9 (H) 4.8 - 5.6 % Final    Comment:    (NOTE) Pre diabetes:          5.7%-6.4%  Diabetes:              >6.4%  Glycemic control for   <7.0% adults with diabetes     CBG: Recent Labs  Lab 03/18/24 1115 03/18/24 1533 03/20/24 1034 03/20/24 1331 03/21/24 0117  GLUCAP 117* 108* 77 108* 126*    Review of Systems:   Unable to assess due to AMS   Past Medical History:  He,  has a past medical history of CHF (congestive heart failure) (HCC), Degenerative joint disease, High cholesterol, Hypertension, Kidney stones, Obesity, Respiratory failure with hypoxia (HCC), and Sleep apnea.   Surgical History:  History reviewed. No pertinent surgical history.   Social History:   reports that he quit smoking about 25 years ago. His smoking use included cigars. He does not have any smokeless tobacco history on file. He reports that he does not drink alcohol and does not use drugs.   Family History:  His family history is not on file.   Allergies No Known Allergies   Home Medications  Prior to Admission medications   Medication Sig Start Date End Date Taking? Authorizing Provider  albuterol  (PROVENTIL ) (2.5 MG/3ML) 0.083% nebulizer solution Take 2.5 mg by nebulization every 6 (six)  hours as needed for shortness of breath or wheezing. 03/04/24  Yes [provider]  hydrocortisone  sodium succinate  (SOLU-CORTEF ) 100 MG injection Inject 2 mLs (100 mg total) into the vein every 8 (eight) hours. 03/08/24  Yes Tat, Alm, MD  midodrine  (PROAMATINE ) 10 MG tablet Take 1 tablet (10 mg total) by mouth 3 (three) times daily with meals. 03/08/24  Yes Tat, Alm, MD  norepinephrine  (LEVOPHED ) 4-5 MG/250ML-% SOLN Inject 0-10 mcg/min into the vein continuous. 03/08/24  Yes Tat, Alm, MD  rosuvastatin  (CRESTOR ) 10 MG tablet Take 10 mg by mouth daily.   Yes [provider]  sertraline  (ZOLOFT ) 25 MG tablet Take 25 mg by mouth in the morning.   Yes [provider]  TYLENOL  325 MG tablet Take 325-650 mg by mouth every 8 (eight) hours as needed for mild pain (pain score 1-3) (or headaches).   Yes [provider]     Critical care time: 40 minutes     Inge Lecher, AGACNP-BC [facility] Prefer epic messenger for cross cover needs If after hours, please call E-link

## 2024-03-21 NOTE — Progress Notes (Signed)
 A consult was placed to the IV Nurse to check the [facility], as the red and blue ports had no blood returns, but flushed ok, per RN;  was able to get blood returns very easily from both ports; RNs aware; both ports flushed easily with 10ml NS each.  Dead end caps intact; clamps closed;

## 2024-03-21 NOTE — Progress Notes (Signed)
 0800 VAST consult to assess PICC, white lumen clotted. After speaking to night shift VAST RN, determined she was unable to instill alteplase  and pulled line back 2cms (total 4cms out at this time). Chest x-ray was obtained: Right PICC has been pulled back into the distal SVC. Upon arrival at bedside, red and gray lumens with good blood return and flushed well. White lumen occluded; alteplase  mixed and allowed to sit attached to white lumen. Every 5 mins, syringe was pulled back and slowly released to allow small amounts of alteplase  to enter catheter. Around 0830, entire dose of alteplase  was instilled into white lumen; to dwell for at least 2 hours. Unit nurses notified.  1120 Returned to assess white lumen of RA TL PICC. Noted previous assessment noted at 0900 that red and gray lumens were occluded; spoke with unit nurses who stated they were unable to obtain blood return or flush lumens after I left this morning. Inquired what position patient's arm was in when line quit working; I was advised it was down by his side as it had been when I assessed it previously.  VAST RN able to obtain blood return and flush all lumens without difficulty with patient's arm by his side. Provided education to unit nurses that line may be positional and steps to take for troubleshooting if can't flush or obtain blood return. Advised ICU nurses if further problems arise, to place IV consult and VAST RN will return to re-assess.

## 2024-03-21 NOTE — Progress Notes (Signed)
 [facility] [place], [place] 03/21/24  Subjective:   Hospital day # 52 78 year old male with hypertension, heart failure with preserved ejection fraction, hyperlipidemia, stage IV CKD, obstructive sleep apnea, MGUS, chronic respiratory failure on 2 L oxygen  presented from [facility] with shortness of breath.  Transferred to [facility] for need of continuous dialysis.  Update:  Patient transitioned to CRRT yesterday. Currently on BiPAP. Case discussed with nursing this a.m. We advised him to increase UF target to 100 cc/h.  Objective:  Vital signs in last 24 hours:  Temp:  [97.7 F (36.5 C)-98.8 F (37.1 C)] 97.7 F (36.5 C) (11/27 0800) Pulse Rate:  [67-97] 72 (11/27 0930) Resp:  [14-29] 20 (11/27 0930) BP: (69-127)/(38-111) 107/56 (11/27 0930) SpO2:  [89 %-100 %] 100 % (11/27 0930) FiO2 (%):  [35 %] 35 % (11/27 0740) Weight:  [124.2 kg-128.5 kg] 124.2 kg (11/27 0500)  Weight change:  Filed Weights   03/20/24 1413 03/20/24 1507 03/21/24 0500  Weight: 128.5 kg 128.2 kg 124.2 kg    Intake/Output:    Intake/Output Summary (Last 24 hours) at 03/21/2024 1027 Last data filed at 03/21/2024 1000 Gross per 24 hour  Intake 305.13 ml  Output 535 ml  Net -229.87 ml     Physical Exam: General: Ill-appearing elderly  Pulm/lungs Scattered rhonchi, Bipap  CVS/Heart S1S2 no rubs  Abdomen:  Distended, nontender  Extremities: 2+ bilateral lower extremity edema  Neurologic: Alert, hard of hearing  Skin: Warm, dry  Access: Left femoral temporary dialysis catheter       Basic Metabolic Panel:  Recent Labs  Lab 03/15/24 0135 03/15/24 0509 03/16/24 0414 03/17/24 0503 03/17/24 1407 03/18/24 0520 03/19/24 0359 03/20/24 0500 03/21/24 0413  NA 134*   < > 136   < > 135 136 136 135 137  K 4.3   < > 4.1   < > 3.8 4.2 3.8 4.9 4.6  CL 101   < > 103   < > 102 103 102 99 102  CO2 24   < > 23   < > 24 23 23 26 24   GLUCOSE 145*   < > 122*   < > 126* 115*  103* 131* 136*  BUN 13   < > 22   < > 23 31* 42* 41* 43*  CREATININE 1.29*   < > 2.13*   < > 2.21* 2.92* 3.61* 3.47* 3.22*  CALCIUM  8.9   < > 9.6   < > 9.4 9.6 9.9 9.7 9.2  MG 2.4  --  2.4  --   --   --  2.3 2.3 2.2  PHOS  --    < > 2.7   < > 3.0 4.0 5.0* 7.1* 5.4*   < > = values in this interval not displayed.     CBC: Recent Labs  Lab 03/17/24 1407 03/18/24 0520 03/19/24 0359 03/20/24 0500 03/21/24 0413  WBC 7.9 10.5 8.7 17.2* 18.7*  HGB 10.3* 9.3* 9.1* 9.9* 9.3*  HCT 32.4* 29.5* 28.5* 31.6* 29.6*  MCV 100.0 100.0 100.4* 103.6* 102.1*  PLT 115* 144* 141* 193 185      Lab Results  Component Value Date   HEPBSAG NON REACTIVE 03/16/2024      Microbiology:  Recent Results (from the past 240 hours)  Culture, Respiratory w Gram Stain     Status: None   Collection Time: 03/12/24  8:21 AM   Specimen: Tracheal Aspirate; Respiratory  Result Value Ref Range Status   Specimen  Description   Final    TRACHEAL ASPIRATE Performed at [facility], [address] [place], [place] [address]    Special Requests   Final    NONE Performed at [facility], [address] [place], [place] [address]    Gram Stain   Final    RARE WBC PRESENT,BOTH PMN AND MONONUCLEAR FEW GRAM POSITIVE RODS    Culture   Final    MODERATE METHICILLIN RESISTANT STAPHYLOCOCCUS AUREUS WITHIN MIXED FLORA Performed at [facility], [address], [place], [place] [address]    Report Status 03/15/2024 FINAL  Final   Organism ID, Bacteria METHICILLIN RESISTANT STAPHYLOCOCCUS AUREUS  Final      Susceptibility   Methicillin resistant staphylococcus aureus - MIC*    CIPROFLOXACIN >=8 RESISTANT Resistant     ERYTHROMYCIN >=8 RESISTANT Resistant     GENTAMICIN <=0.5 SENSITIVE Sensitive     OXACILLIN >=4 RESISTANT Resistant     TETRACYCLINE <=1 SENSITIVE Sensitive     VANCOMYCIN  1 SENSITIVE Sensitive     TRIMETH/SULFA >=320 RESISTANT Resistant     CLINDAMYCIN <=0.25 SENSITIVE  Sensitive     RIFAMPIN <=0.5 SENSITIVE Sensitive     Inducible Clindamycin NEGATIVE Sensitive     LINEZOLID  2 SENSITIVE Sensitive     * MODERATE METHICILLIN RESISTANT STAPHYLOCOCCUS AUREUS  MRSA Next Gen by PCR, Nasal     Status: Abnormal   Collection Time: 03/13/24 11:57 AM   Specimen: Nasal Mucosa; Nasal Swab  Result Value Ref Range Status   MRSA by PCR Next Gen DETECTED (A) NOT DETECTED Final    Comment: RESULT CALLED TO, READ BACK BY AND VERIFIED WITH: JORGE MORALES AT 1326 ON 03/13/24 BY SS (NOTE) The GeneXpert MRSA Assay (FDA approved for NASAL specimens only), is one component of a comprehensive MRSA colonization surveillance program. It is not intended to diagnose MRSA infection nor to guide or monitor treatment for MRSA infections. Test performance is not FDA approved in patients less than 61 years old. Performed at [facility], [address] [place], [place] [address]     Coagulation Studies: No results for input(s): LABPROT, INR in the last 72 hours.   Urinalysis: No results for input(s): COLORURINE, LABSPEC, PHURINE, GLUCOSEU, HGBUR, BILIRUBINUR, KETONESUR, PROTEINUR, UROBILINOGEN, NITRITE, LEUKOCYTESUR in the last 72 hours.  Invalid input(s): APPERANCEUR    Imaging: DG Chest 1 View Result Date: 03/21/2024 EXAM: 1 AP VIEW(S) XRAY OF THE CHEST 03/19/2024 07:25:00 AM COMPARISON: Portable chest 03/19/2024. CLINICAL HISTORY: 741019 PICC (peripherally inserted central catheter) in place [id number] PICC (peripherally inserted central catheter) in place FINDINGS: LINES, TUBES AND DEVICES: Right PICC has been pulled back into the distal SVC. LUNGS AND PLEURA: Small layering pleural effusions continue to be noted with overlying basilar atelectasis or consolidation. There is Perihilar vascular congestion, central and basilar interstitial edema. Overall aeration is unchanged with no other focal abnormality. No pneumothorax. HEART AND  MEDIASTINUM: Stable cardiomegaly. Mediastinum is stable with widening probably due to lipomatosis. There is calcification in the transverse aorta. BONES AND SOFT TISSUES: Thoracic spondylosis. IMPRESSION: 1. Right PICC terminates in the distal SVC. 2. Small layering pleural effusions with overlying basilar atelectasis or consolidation. 3. Stable cardiomegaly with perihilar vascular congestion and central and basilar interstitial edema. Electronically signed by: Francis Quam MD 03/21/2024 07:40 AM EST RP Workstation: [id number]   US  EKG SITE RITE Result Date: 03/21/2024 If Site Rite image not attached, placement could not be confirmed due to current cardiac rhythm.  DG  Abd 1 View Result Date: 03/19/2024 CLINICAL DATA:  Abdominal distension. EXAM: ABDOMEN - 1 VIEW COMPARISON:  Radiographs 03/09/2024.  Chest CT 03/07/2024. FINDINGS: Three supine views of the abdomen are submitted. The enteric tube has been removed. A left femoral line is in place. There is increased moderate to marked gaseous distention of the stomach. There is gas throughout the small and large bowel which are not significantly distended. Similar small left pleural effusion and associated left basilar airspace disease, probably atelectasis. IMPRESSION: Increased gaseous distention of the stomach following enteric tube removal. This could reflect gastroparesis or ileus. No evidence of bowel obstruction. Electronically Signed   By: Elsie Perone M.D.   On: 03/19/2024 18:54       Medications:    anticoagulant sodium citrate      linezolid  (ZYVOX ) IV 600 mg (03/21/24 1025)   norepinephrine  (LEVOPHED ) Adult infusion 9 mcg/min (03/21/24 1001)   prismasol  BGK 4/2.5 400 mL/hr at 03/21/24 0330   prismasol  BGK 4/2.5 400 mL/hr at 03/21/24 0330   prismasol  BGK 4/2.5 2,000 mL/hr at 03/21/24 0330    budesonide  (PULMICORT ) nebulizer solution  0.25 mg Nebulization BID   Chlorhexidine  Gluconate Cloth  6 each Topical Q0600   heparin  injection  (subcutaneous)  5,000 Units Subcutaneous Q8H   ipratropium-albuterol   3 mL Nebulization TID   melatonin  2.5 mg Oral QHS   midodrine   5 mg Oral TID WC   multivitamin  1 tablet Oral QHS   nystatin   5 mL Oral QID   mouth rinse  15 mL Mouth Rinse 4 times per day   pantoprazole  (PROTONIX ) IV  40 mg Intravenous QHS   Ensure Max Protein  11 oz Oral TID   sertraline   25 mg Per Tube Daily   sterile water  (preservative free)       anticoagulant sodium citrate , diazepam , diazepam , docusate sodium , fentaNYL  (SUBLIMAZE ) injection, guaiFENesin -dextromethorphan , heparin , heparin , ipratropium-albuterol , ondansetron  (ZOFRAN ) IV, mouth rinse, phenol, polyethylene glycol, sodium chloride  flush, sterile water  (preservative free), traZODone   Assessment/ Plan:  78 y.o. 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  Baseline creatinine of 2.5-2.8. Acute kidney injury is thought to be secondary to pneumonia and circulatory shock causing ATN.  Patient responded well to IV Lasix  03/13/24.  However due to low CVP, further doses were held. CRRT stopped 11/22. First HD 11/23.  Update:  Patient has now become oliguric.  On CRRT.  Increase UF target to 100 cc/h.  Lab Results  Component Value Date   CREATININE 3.22 (H) 03/21/2024   CREATININE 3.47 (H) 03/20/2024   CREATININE 3.61 (H) 03/19/2024     Intake/Output Summary (Last 24 hours) at 03/21/2024 1027 Last data filed at 03/21/2024 1000 Gross per 24 hour  Intake 305.13 ml  Output 535 ml  Net -229.87 ml     Acute respiratory failure Respiratory pathogen's panel by PCR is negative.  Blood  cultures from 03/06/2024 are negative. Patient remains on BiPAP.  Adding UF as above to aid with potential of underlying pulmonary edema.  Anemia of CKD.  Hemoglobin 9.3 and acceptable.   LOS: 13 Salih Williamson 11/27/202510:27 AM  [facility] [address] [place], [place] [phone]

## 2024-03-21 NOTE — Plan of Care (Signed)
  Problem: Clinical Measurements: Goal: Respiratory complications will improve Outcome: Progressing Goal: Cardiovascular complication will be avoided Outcome: Progressing   Problem: Nutrition: Goal: Adequate nutrition will be maintained Outcome: Progressing   Problem: Education: Goal: Knowledge of General Education information will improve Description: Including pain rating scale, medication(s)/side effects and non-pharmacologic comfort measures Outcome: Not Progressing   Problem: Health Behavior/Discharge Planning: Goal: Ability to manage health-related needs will improve Outcome: Not Progressing

## 2024-03-21 NOTE — Consult Note (Signed)
 PHARMACY CONSULT NOTE - ELECTROLYTES  Pharmacy Consult for Electrolyte Monitoring and Replacement   Recent Labs: Height: 5' 9.02 (175.3 cm) Weight: 124.2 kg (273 lb 13 oz) IBW/kg (Calculated) : 70.74 Estimated Creatinine Clearance: 24.6 mL/min (A) (by C-G formula based on SCr of 3.22 mg/dL (H)). Potassium  Date Value  03/21/2024 4.6 mmol/L  07/18/2013 3.4 mEq/L (L)   Magnesium (mg/dL)  Date Value  88/72/7974 2.2   Calcium  (mg/dL)  Date Value  88/72/7974 9.2  07/18/2013 10.1   Albumin (g/dL)  Date Value  88/72/7974 3.2 (L)  07/18/2013 3.8   Phosphorus (mg/dL)  Date Value  88/72/7974 5.4 (H)   Sodium  Date Value  03/21/2024 137 mmol/L  07/18/2013 138 mEq/L   Assessment  Dave Brown is a 78 y.o. male presenting with AKI requiring CRRT. PMH significant for HFpEF and COPD requiring 2L supplemental oxygen . Pharmacy has been consulted to monitor and replace electrolytes.  Goal of Therapy: Electrolytes WNL  Plan:  Patient initiated on CRRT on 11/27 No electrolyte replacement warranted for today. Scr continues to trend up Twice daily BMP checks ordered Recheck all electrolytes tomorrow morning  Thank you for allowing pharmacy to be a part of this patient's care.  Nichola Cieslinski A Monty Spicher 03/21/2024 7:19 AM

## 2024-03-22 ENCOUNTER — Other Ambulatory Visit: Payer: Self-pay

## 2024-03-22 DIAGNOSIS — Z789 Other specified health status: Secondary | ICD-10-CM

## 2024-03-22 DIAGNOSIS — Z7189 Other specified counseling: Secondary | ICD-10-CM

## 2024-03-22 DIAGNOSIS — Z515 Encounter for palliative care: Secondary | ICD-10-CM

## 2024-03-22 LAB — CBC
HCT: 23.6 % — ABNORMAL LOW (ref 39.0–52.0)
Hemoglobin: 7.6 g/dL — ABNORMAL LOW (ref 13.0–17.0)
MCH: 32.3 pg (ref 26.0–34.0)
MCHC: 32.2 g/dL (ref 30.0–36.0)
MCV: 100.4 fL — ABNORMAL HIGH (ref 80.0–100.0)
Platelets: 147 K/uL — ABNORMAL LOW (ref 150–400)
RBC: 2.35 MIL/uL — ABNORMAL LOW (ref 4.22–5.81)
RDW: 14.4 % (ref 11.5–15.5)
WBC: 10.2 K/uL (ref 4.0–10.5)
nRBC: 0 % (ref 0.0–0.2)

## 2024-03-22 LAB — RENAL FUNCTION PANEL
Albumin: 2.9 g/dL — ABNORMAL LOW (ref 3.5–5.0)
Albumin: 3 g/dL — ABNORMAL LOW (ref 3.5–5.0)
Anion gap: 7 (ref 5–15)
Anion gap: 8 (ref 5–15)
BUN: 29 mg/dL — ABNORMAL HIGH (ref 8–23)
BUN: 22 mg/dL (ref 8–23)
CO2: 25 mmol/L (ref 22–32)
CO2: 25 mmol/L (ref 22–32)
Calcium: 8.5 mg/dL — ABNORMAL LOW (ref 8.9–10.3)
Calcium: 8.5 mg/dL — ABNORMAL LOW (ref 8.9–10.3)
Chloride: 101 mmol/L (ref 98–111)
Chloride: 103 mmol/L (ref 98–111)
Creatinine, Ser: 2.33 mg/dL — ABNORMAL HIGH (ref 0.61–1.24)
Creatinine, Ser: 1.98 mg/dL — ABNORMAL HIGH (ref 0.61–1.24)
GFR, Estimated: 28 mL/min — ABNORMAL LOW (ref >60)
GFR, Estimated: 34 mL/min — ABNORMAL LOW (ref >60)
Glucose, Bld: 154 mg/dL — ABNORMAL HIGH (ref 70–99)
Glucose, Bld: 142 mg/dL — ABNORMAL HIGH (ref 70–99)
Phosphorus: 2.4 mg/dL — ABNORMAL LOW (ref 2.5–4.6)
Phosphorus: 2.5 mg/dL (ref 2.5–4.6)
Potassium: 3.9 mmol/L (ref 3.5–5.1)
Potassium: 4.3 mmol/L (ref 3.5–5.1)
Sodium: 133 mmol/L — ABNORMAL LOW (ref 135–145)
Sodium: 136 mmol/L (ref 135–145)

## 2024-03-22 LAB — GLUCOSE, CAPILLARY
Glucose-Capillary: 115 mg/dL — ABNORMAL HIGH (ref 70–99)
Glucose-Capillary: 138 mg/dL — ABNORMAL HIGH (ref 70–99)
Glucose-Capillary: 130 mg/dL — ABNORMAL HIGH (ref 70–99)
Glucose-Capillary: 157 mg/dL — ABNORMAL HIGH (ref 70–99)

## 2024-03-22 LAB — MAGNESIUM: Magnesium: 2.3 mg/dL (ref 1.7–2.4)

## 2024-03-22 MED ORDER — SERTRALINE HCL 50 MG PO TABS
25.0000 mg | ORAL_TABLET | Freq: Every day | ORAL | Status: DC
  Administered 2024-03-23 – 2024-04-05 (×13): 25 mg via ORAL
  Filled 2024-03-22 (×13): qty 1

## 2024-03-22 MED ORDER — MIDODRINE HCL 5 MG PO TABS
10.0000 mg | ORAL_TABLET | Freq: Three times a day (TID) | ORAL | Status: DC
  Administered 2024-03-22 – 2024-04-05 (×39): 10 mg via ORAL
  Filled 2024-03-22 (×39): qty 2

## 2024-03-22 MED ORDER — HYDROCORTISONE SOD SUC (PF) 100 MG IJ SOLR
50.0000 mg | Freq: Four times a day (QID) | INTRAMUSCULAR | Status: AC
  Administered 2024-03-22 – 2024-03-23 (×4): 50 mg via INTRAVENOUS
  Filled 2024-03-22 (×4): qty 2

## 2024-03-22 MED ORDER — POLYETHYLENE GLYCOL 3350 17 G PO PACK: 17.0000 g | PACK | Freq: Every day | ORAL | Status: DC | PRN

## 2024-03-22 MED ORDER — POTASSIUM PHOSPHATES 15 MMOLE/5ML IV SOLN
15.0000 mmol | Freq: Once | INTRAVENOUS | Status: AC
  Administered 2024-03-22: 15 mmol via INTRAVENOUS
  Filled 2024-03-22: qty 5

## 2024-03-22 NOTE — Consult Note (Signed)
 PHARMACY CONSULT NOTE - ELECTROLYTES  Pharmacy Consult for Electrolyte Monitoring and Replacement   Recent Labs: Height: 5' 9.02 (175.3 cm) Weight: 126.1 kg (278 lb) IBW/kg (Calculated) : 70.74 Estimated Creatinine Clearance: 34.3 mL/min (A) (by C-G formula based on SCr of 2.33 mg/dL (H)). Potassium  Date Value  03/22/2024 3.9 mmol/L  07/18/2013 3.4 mEq/L (L)   Magnesium (mg/dL)  Date Value  88/71/7974 2.3   Calcium  (mg/dL)  Date Value  88/71/7974 8.5 (L)  07/18/2013 10.1   Albumin (g/dL)  Date Value  88/71/7974 2.9 (L)  07/18/2013 3.8   Phosphorus (mg/dL)  Date Value  88/71/7974 2.4 (L)   Sodium  Date Value  03/22/2024 133 mmol/L (L)  07/18/2013 138 mEq/L   Assessment  Dave Brown is a 78 y.o. male presenting with AKI requiring CRRT. PMH significant for HFpEF and COPD requiring 2L supplemental oxygen . Pharmacy has been consulted to monitor and replace electrolytes.  Goal of Therapy: Electrolytes WNL  Plan:  Patient initiated on CRRT on 11/27 Potassium phosphate  15mmol IV x 1 Twice daily BMP checks ordered Recheck all electrolytes tomorrow morning  Thank you for allowing pharmacy to be a part of this patient's care.  Nusrat Encarnacion A Abigale Dorow 03/22/2024 7:38 AM

## 2024-03-22 NOTE — Progress Notes (Signed)
 15:55 CRRT stopped and filter changed due to increasing high pressures. Blood returned.  16:30 CRRT filter changed with no complications.

## 2024-03-22 NOTE — Progress Notes (Signed)
 NAME:  Dave Brown, MRN:  [id number], DOB:  January 10, 1946, LOS: 14 ADMISSION DATE:  03/08/2024, CONSULTATION DATE:  03/08/2024  Chief complaint Follow-up respiratory failure  Brief Pt Description / Synopsis:  78 y.o. male admitted with Acute on Chronic HFpEF, Cardiogenic/Septic shock, AKI, Acute Hypoxic Respiratory Failure, and MRSA Pneumonia requiring mechanical ventilation and initiation of CRRT.   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 He denied any fevers, chills, chest pain.     Of note, the patient was recently admitted to the hospital from 02/01/2024 to 02/05/2024 for respiratory failure due to CHF.  The patient did not require oxygen  at the time of his discharge.  His discharge weight was 286.  At his last office visit with his primary provider his weight was 291 pounds on 02/22/24.  Follow up with pulmonary on 03/04/24 office weight 29.   He was discharged home with furosemide  40 mg daily during last hospitalization  Imaging Chest X-ray>>patchy opacities right lower lobe and left lower lobe     Pertinent  Medical History   Past Medical History:  Diagnosis Date   CHF (congestive heart failure) (HCC)    Degenerative joint disease    High cholesterol    Hypertension    Kidney stones    Obesity    Respiratory failure with hypoxia (HCC)    Sleep apnea     Micro Data:  11/12: Blood cultures x2>> no growth 11/13: MRSA PCR>> negative 11/13: RVP>> negative 11/24: MRSA PCR>> negative 11/18: Tracheal Aspirate>> MRSA 11/19: MRSA PCR + 11/27: Sputum>>  Antimicrobials:   Anti-infectives (From admission, onward)    Start     Dose/Rate Route Frequency Ordered Stop   03/21/24  1015  linezolid  (ZYVOX ) IVPB 600 mg        600 mg 300 mL/hr over 60 Minutes Intravenous Every 12 hours 03/21/24 0918     03/17/24 1300  vancomycin  (VANCOCIN ) IVPB 1000 mg/200 mL premix        1,000 mg 200 mL/hr over 60 Minutes Intravenous  Once 03/17/24 1147 03/17/24 1420   03/16/24 1200  vancomycin  (VANCOCIN ) IVPB 1000 mg/200 mL premix        1,000 mg 200 mL/hr over 60 Minutes Intravenous  Once 03/16/24 1104 03/16/24 2100   03/15/24 2200  piperacillin -tazobactam (ZOSYN ) IVPB 3.375 g        3.375 g 12.5 mL/hr over 240 Minutes Intravenous Every 8 hours 03/15/24 1412 03/17/24 0128   03/15/24 1600  vancomycin  (VANCOREADY) IVPB 1500 mg/300 mL        1,500 mg 150 mL/hr over 120 Minutes Intravenous  Once 03/15/24 1411 03/15/24 1705   03/15/24 1411  vancomycin  variable dose per unstable renal function (pharmacist dosing)  Status:  Discontinued         Does not apply See admin instructions 03/15/24 1411 03/19/24 1107   03/15/24 1400  vancomycin  (VANCOREADY) IVPB 1500 mg/300 mL  Status:  Discontinued        1,500 mg 150 mL/hr over 120 Minutes Intravenous Every 24 hours 03/14/24 1139 03/15/24 1411   03/14/24 1200  vancomycin  (VANCOREADY) IVPB 2000 mg/400 mL        2,000 mg 200 mL/hr over 120 Minutes  Intravenous  Once 03/14/24 1012 03/14/24 1405   03/12/24 1200  piperacillin -tazobactam (ZOSYN ) IVPB 3.375 g  Status:  Discontinued        3.375 g 100 mL/hr over 30 Minutes Intravenous Every 6 hours 03/12/24 1013 03/15/24 1412        Significant Hospital Events: Including procedures, antibiotic start and stop dates in addition to other pertinent events   11/12: Admitted to [facility] to [place] for treatment of Acute Decompensated HFpEF and AKI. 11/13: BP decreased, started on Levophed .  PCCM consulted.  ABX discontinued. 11/14: Transfer to [facility] for initiation of CRRT.  11/15: seizure like episode, intubated 11/16: Unable to wean; PICC line placed 11/21: Extubated to Bipap. Added Dobutamine .   11/23: Patient remains on bipap this AM. WBC stable at 7.6. H&H Stable. [place] SvO2 67%.  Electrolytes within normal range. NE decreased to 4. Remains on Dobutamin at 5.  11/24: Off BiPAP, awake and alert but remains encephalopathic.  Afebrile, Remains on Levophed , currently 10 mcg, weaning as tolerated.  Creatinine increased to 2.9, but UOP 1.4 L (net - 7.8L), Nephrology holding off on HD today. 11/25: No significant events overnight.  Midodrine  started overnight, now off Levophed .   Precedex  weaned off yesterday, remains encephalopathic but slowly improving. On 4L [place]. Creatinine worsened to 3.6, UOP 675 cc last 24 hrs (net - 8.4L), plan for HD today.  Plan for aggressive pulmonary toilet and mobilization as able.  11/26: Remains critically ill; desatted to the 80s overnight, placed on bipap and developed hypotension requiring initiation of levophed  gtt again. Hypercapnic overnight, will recheck vbg. Nephrology following, placed back on CRRT.  11/27: No acute events overnight, afebrile, remains on Levophed  and CRRT.  Worsening Leukocytosis, obtain Sputum culture and start Linezolid .  Weaned off BiPAP to [place]. 11/28 remains on CRRT remains on pressors  Interim History / Subjective:  Remains critically ill Morbidly obese on pressors Remains on CRRT BiPAP at night   Objective   Blood pressure (!) 114/54, pulse 83, temperature 98.5 F (36.9 C), resp. rate 18, height 5' 9.02 (1.753 m), weight 126.1 kg, SpO2 96%.    FiO2 (%):  [35 %] 35 %   Intake/Output Summary (Last 24 hours) at 03/22/2024 1115 Last data filed at 03/22/2024 1100 Gross per 24 hour  Intake 1134.31 ml  Output 3563 ml  Net -2428.69 ml   Filed Weights   03/20/24 1507 03/21/24 0500 03/22/24 0407  Weight: 128.2 kg 124.2 kg 126.1 kg   Physical examination Alert awake Morbidly obese Critically ill-appearing BiPAP at night Rhonchorous No murmurs Distended abdomen nontender Lower extremity edema No focal deficits Foley in  place  Assessment & Plan:  78 year old morbidly obese white male with progressive cardiorenal syndrome now progressed to end-stage renal disease in the setting of morbid obesity with MRSA pneumonia with severe septic shock  Acute hypoxic respiratory failure due to MRSA pneumonia and pulmonary edema High risk for aspiration high risk for reintubation Patient needs BiPAP at night Oxygen  as needed Chest x-ray ABG as needed   Shock combination sepsis and cardiogenic Continue pressors to maintain MAP goal Continue midodrine  Follow-up cardiology recommendations #Acute on Chronic HFpEF #Shock: Cardiogenic +/- Septic  #Paroxsymal A. Fib -Echocardiogram 03/16/24: LVEF >75%, grade I DD, RV systolic function normal, RV size normal -Holding Heparin  gtt due to mild hemoptysis/hematuria   Infectious disease -Start empiric Linezolid  on 11/27 pending cultures & sensitivities ~ previously completed 8 days of Vancomycin , however after Vancomycin  completed, pt with worsening respiratory status, worsening leukocytosis,  and hypotensive requiring Levophed  therefore resuming MRSA coverage with Linezolid     ACUTE KIDNEY INJURY/Renal Failure I am concerned this has progressed to end-stage disease currently on CRRT -continue Foley Catheter-assess need -Avoid nephrotoxic agents -Follow urine output, BMP -Ensure adequate renal perfusion, optimize oxygenation -Renal dose medications   Intake/Output Summary (Last 24 hours) at 03/22/2024 1120 Last data filed at 03/22/2024 1100 Gross per 24 hour  Intake 1134.31 ml  Output 3563 ml  Net -2428.69 ml    ENDO - ICU hypoglycemic\Hyperglycemia protocol -check FSBS per protocol   GI GI PROPHYLAXIS as indicated NUTRITIONAL STATUS DIET-->as tolerated Constipation protocol as indicated   ELECTROLYTES -follow labs as needed -replace as needed -pharmacy consultation and following  RESTRICTIVE TRANSFUSION PROTOCOL TRANSFUSION  IF HGB<7  or ACTIVE  BLEEDING OR DX of ACUTE CORONARY SYNDROMES        Best Practice (right click and Reselect all SmartList Selections daily)   Diet/type: Regular consistency (see orders) DVT prophylaxis: SCD GI prophylaxis: PPI Lines: Central line, Dialysis Catheter, and yes and it is still needed Foley:  N/A Code Status:  full code Last date of multidisciplinary goals of care discussion [11/25]  11/25: Will update pt's family when they arrive at bedside on plan of care. 11/28-daughter [name] updated over the phone  Labs   CBC: Recent Labs  Lab 03/18/24 0520 03/19/24 0359 03/20/24 0500 03/21/24 0413 03/22/24 0326  WBC 10.5 8.7 17.2* 18.7* 10.2  HGB 9.3* 9.1* 9.9* 9.3* 7.6*  HCT 29.5* 28.5* 31.6* 29.6* 23.6*  MCV 100.0 100.4* 103.6* 102.1* 100.4*  PLT 144* 141* 193 185 147*    Basic Metabolic Panel: Recent Labs  Lab 03/16/24 0414 03/17/24 0503 03/19/24 0359 03/20/24 0500 03/21/24 0413 03/21/24 1655 03/22/24 0326  NA 136   < > 136 135 137 137 133*  K 4.1   < > 3.8 4.9 4.6 4.2 3.9  CL 103   < > 102 99 102 102 101  CO2 23   < > 23 26 24 26 25   GLUCOSE 122*   < > 103* 131* 136* 99 154*  BUN 22   < > 42* 41* 43* 37* 29*  CREATININE 2.13*   < > 3.61* 3.47* 3.22* 2.87* 2.33*  CALCIUM  9.6   < > 9.9 9.7 9.2 9.0 8.5*  MG 2.4  --  2.3 2.3 2.2  --  2.3  PHOS 2.7   < > 5.0* 7.1* 5.4* 4.0 2.4*   < > = values in this interval not displayed.   GFR: Estimated Creatinine Clearance: 34.3 mL/min (A) (by C-G formula based on SCr of 2.33 mg/dL (H)). Recent Labs  Lab 03/18/24 1356 03/19/24 0359 03/20/24 0500 03/21/24 0413 03/21/24 0907 03/22/24 0326  WBC  --  8.7 17.2* 18.7*  --  10.2  LATICACIDVEN 0.8  --   --   --  1.5  --     Liver Function Tests: Recent Labs  Lab 03/19/24 0359 03/20/24 0500 03/21/24 0413 03/21/24 1655 03/22/24 0326  ALBUMIN 3.0* 3.3* 3.2* 3.0* 2.9*   No results for input(s): LIPASE, AMYLASE in the last 168 hours. No results for input(s): AMMONIA in  the last 168 hours.  ABG    Component Value Date/Time   PHART 7.41 03/16/2024 1412   PCO2ART 41 03/16/2024 1412   PO2ART 167 (H) 03/16/2024 1412   HCO3 25.4 03/20/2024 1210   ACIDBASEDEF 3.9 (H) 03/20/2024 1210   O2SAT 74.7 03/20/2024 1210     Coagulation Profile: No results for  input(s): INR, PROTIME in the last 168 hours.  Cardiac Enzymes: No results for input(s): CKTOTAL, CKMB, CKMBINDEX, TROPONINI in the last 168 hours.  HbA1C: Hgb A1c MFr Bld  Date/Time Value Ref Range Status  03/08/2024 03:11 AM 5.2 4.8 - 5.6 % Final    Comment:    (NOTE) Diagnosis of Diabetes The following HbA1c ranges recommended by the American Diabetes Association (ADA) may be used as an aid in the diagnosis of diabetes mellitus.  Hemoglobin             Suggested A1C NGSP%              Diagnosis  <5.7                   Non Diabetic  5.7-6.4                Pre-Diabetic  >6.4                   Diabetic  <7.0                   Glycemic control for                       adults with diabetes.    05/02/2020 02:37 PM 6.9 (H) 4.8 - 5.6 % Final    Comment:    (NOTE) Pre diabetes:          5.7%-6.4%  Diabetes:              >6.4%  Glycemic control for   <7.0% adults with diabetes     CBG: Recent Labs  Lab 03/21/24 1927 03/21/24 2332 03/22/24 0330 03/22/24 0733 03/22/24 1109  GLUCAP 86 105* 115* 138* 130*    DVT/GI PRX  assessed I Assessed the need for Labs I Assessed the need for Foley I Assessed the need for Central Venous Line Family Discussion when available I Assessed the need for Mobilization I made an Assessment of medications to be adjusted accordingly Safety Risk assessment completed  CASE DISCUSSED IN MULTIDISCIPLINARY ROUNDS WITH ICU TEAM   Overall prognosis is poor in the setting of advanced age morbid obesity multiorgan failure I do not anticipate patient going home Patient will require long-term care, at this time still has acute ICU  illness  Critical Care Time devoted to patient care services described in this note is 65 minutes.  Critical care was necessary to treat /prevent imminent and life-threatening deterioration. Overall, patient is critically ill, prognosis is guarded.  Patient with Multiorgan failure and at high risk for cardiac arrest and death.    Nickolas Alm Cellar, M.D.  [facility]  Medical Director [facility]

## 2024-03-22 NOTE — Progress Notes (Signed)
 [facility] [place], [place] 03/22/24  Subjective:   Hospital day # 7 78 year old male with hypertension, heart failure with preserved ejection fraction, hyperlipidemia, stage IV CKD, obstructive sleep apnea, MGUS, chronic respiratory failure on 2 L oxygen  presented from [facility] with shortness of breath.  Transferred to [facility] for need of continuous dialysis.  Update:  Remains in ICU Alert and oriented 4L [place], Bipap overnight Levo in place CRRT, -169ml/hr  Objective:  Vital signs in last 24 hours:  Temp:  [97.6 F (36.4 C)-99 F (37.2 C)] 98.5 F (36.9 C) (11/28 0700) Pulse Rate:  [66-88] 83 (11/28 1015) Resp:  [13-30] 18 (11/28 1015) BP: (60-121)/(41-77) 114/54 (11/28 1015) SpO2:  [80 %-100 %] 96 % (11/28 1015) FiO2 (%):  [35 %] 35 % (11/28 0831) Weight:  [126.1 kg] 126.1 kg (11/28 0407)  Weight change: 0 kg Filed Weights   03/20/24 1507 03/21/24 0500 03/22/24 0407  Weight: 128.2 kg 124.2 kg 126.1 kg    Intake/Output:    Intake/Output Summary (Last 24 hours) at 03/22/2024 1058 Last data filed at 03/22/2024 1000 Gross per 24 hour  Intake 1066.73 ml  Output 3503 ml  Net -2436.27 ml     Physical Exam: General: Ill-appearing elderly  Pulm/lungs Scattered rhonchi, [place] O2  CVS/Heart S1S2 no rubs  Abdomen:  Distended, nontender  Extremities: 2+ bilateral lower extremity edema  Neurologic: Alert, hard of hearing  Skin: Warm, dry  Access: Left femoral temporary dialysis catheter       Basic Metabolic Panel:  Recent Labs  Lab 03/16/24 0414 03/17/24 0503 03/19/24 0359 03/20/24 0500 03/21/24 0413 03/21/24 1655 03/22/24 0326  NA 136   < > 136 135 137 137 133*  K 4.1   < > 3.8 4.9 4.6 4.2 3.9  CL 103   < > 102 99 102 102 101  CO2 23   < > 23 26 24 26 25   GLUCOSE 122*   < > 103* 131* 136* 99 154*  BUN 22   < > 42* 41* 43* 37* 29*  CREATININE 2.13*   < > 3.61* 3.47* 3.22* 2.87* 2.33*  CALCIUM  9.6   < > 9.9 9.7 9.2 9.0 8.5*  MG  2.4  --  2.3 2.3 2.2  --  2.3  PHOS 2.7   < > 5.0* 7.1* 5.4* 4.0 2.4*   < > = values in this interval not displayed.     CBC: Recent Labs  Lab 03/18/24 0520 03/19/24 0359 03/20/24 0500 03/21/24 0413 03/22/24 0326  WBC 10.5 8.7 17.2* 18.7* 10.2  HGB 9.3* 9.1* 9.9* 9.3* 7.6*  HCT 29.5* 28.5* 31.6* 29.6* 23.6*  MCV 100.0 100.4* 103.6* 102.1* 100.4*  PLT 144* 141* 193 185 147*      Lab Results  Component Value Date   HEPBSAG NON REACTIVE 03/16/2024      Microbiology:  Recent Results (from the past 240 hours)  MRSA Next Gen by PCR, Nasal     Status: Abnormal   Collection Time: 03/13/24 11:57 AM   Specimen: Nasal Mucosa; Nasal Swab  Result Value Ref Range Status   MRSA by PCR Next Gen DETECTED (A) NOT DETECTED Final    Comment: RESULT CALLED TO, READ BACK BY AND VERIFIED WITH: JORGE MORALES AT 1326 ON 03/13/24 BY SS (NOTE) The GeneXpert MRSA Assay (FDA approved for NASAL specimens only), is one component of a comprehensive MRSA colonization surveillance program. It is not intended to diagnose MRSA infection nor to guide or monitor  treatment for MRSA infections. Test performance is not FDA approved in patients less than 39 years old. Performed at [facility], [address] [place], [place] [address]   Expectorated Sputum Assessment w Gram Stain, Rflx to Resp Cult     Status: None   Collection Time: 03/21/24  5:20 PM   Specimen: Throat; Sputum  Result Value Ref Range Status   Specimen Description THROAT  Final   Special Requests NONE  Final   Sputum evaluation   Final    Sputum specimen not acceptable for testing.  Please recollect.   RECOLLECT [address] 03/21/2024 [facility] Performed at [facility], [address] [place], [place] [address]    Report Status 03/21/2024 FINAL  Final    Coagulation Studies: No results for input(s): LABPROT, INR in the last 72 hours.   Urinalysis: No results for input(s): COLORURINE, LABSPEC,  PHURINE, GLUCOSEU, HGBUR, BILIRUBINUR, KETONESUR, PROTEINUR, UROBILINOGEN, NITRITE, LEUKOCYTESUR in the last 72 hours.  Invalid input(s): APPERANCEUR    Imaging: DG Chest 1 View Result Date: 03/21/2024 EXAM: 1 AP VIEW(S) XRAY OF THE CHEST 03/19/2024 07:25:00 AM COMPARISON: Portable chest 03/19/2024. CLINICAL HISTORY: [id number] PICC (peripherally inserted central catheter) in place [id number] PICC (peripherally inserted central catheter) in place FINDINGS: LINES, TUBES AND DEVICES: Right PICC has been pulled back into the distal SVC. LUNGS AND PLEURA: Small layering pleural effusions continue to be noted with overlying basilar atelectasis or consolidation. There is Perihilar vascular congestion, central and basilar interstitial edema. Overall aeration is unchanged with no other focal abnormality. No pneumothorax. HEART AND MEDIASTINUM: Stable cardiomegaly. Mediastinum is stable with widening probably due to lipomatosis. There is calcification in the transverse aorta. BONES AND SOFT TISSUES: Thoracic spondylosis. IMPRESSION: 1. Right PICC terminates in the distal SVC. 2. Small layering pleural effusions with overlying basilar atelectasis or consolidation. 3. Stable cardiomegaly with perihilar vascular congestion and central and basilar interstitial edema. Electronically signed by: Francis Quam MD 03/21/2024 07:40 AM EST RP Workstation: [id number]   US  EKG SITE RITE Result Date: 03/21/2024 If Site Rite image not attached, placement could not be confirmed due to current cardiac rhythm.      Medications:    anticoagulant sodium citrate      linezolid  (ZYVOX ) IV 300 mL/hr at 03/22/24 1000   norepinephrine  (LEVOPHED ) Adult infusion 13 mcg/min (03/22/24 1000)   potassium PHOSPHATE  IVPB (in mmol) 43 mL/hr at 03/22/24 1000   prismasol  BGK 4/2.5 600 mL/hr at 03/22/24 0249   prismasol  BGK 4/2.5 600 mL/hr at 03/22/24 0253   prismasol  BGK 4/2.5 2,000 mL/hr at 03/22/24 1022    budesonide   (PULMICORT ) nebulizer solution  0.25 mg Nebulization BID   Chlorhexidine  Gluconate Cloth  6 each Topical Q0600   heparin  injection (subcutaneous)  5,000 Units Subcutaneous Q8H   hydrocortisone  sod succinate (SOLU-CORTEF ) inj  50 mg Intravenous Q6H   ipratropium-albuterol   3 mL Nebulization BID   melatonin  2.5 mg Oral QHS   midodrine   10 mg Oral TID WC   multivitamin  1 tablet Oral QHS   nystatin   5 mL Oral QID   mouth rinse  15 mL Mouth Rinse 4 times per day   pantoprazole  (PROTONIX ) IV  40 mg Intravenous QHS   Ensure Max Protein  11 oz Oral TID   [START ON 03/23/2024] sertraline   25 mg Oral Daily   anticoagulant sodium citrate , diazepam , diazepam , docusate sodium , fentaNYL  (SUBLIMAZE ) injection, guaiFENesin -dextromethorphan , heparin , heparin , ipratropium-albuterol , ondansetron  (ZOFRAN ) IV, mouth rinse, phenol, polyethylene glycol, sodium chloride  flush,  traZODone   Assessment/ Plan:  78 y.o. 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  Baseline creatinine of 2.5-2.8. Acute kidney injury is thought to be secondary to pneumonia and circulatory shock causing ATN.  Patient responded well to IV Lasix  03/13/24.  However due to low CVP, further doses were held. CRRT stopped 11/22. First HD 11/23.  Update:  Remains oliguris, recorded during day shift yesterday. CRRT remains in place, tolerating 160ml/hr UF fair. Will continue for now  Lab Results  Component Value Date   CREATININE 2.33 (H) 03/22/2024   CREATININE 2.87 (H) 03/21/2024   CREATININE 3.22 (H) 03/21/2024     Intake/Output Summary (Last 24 hours) at  03/22/2024 1058 Last data filed at 03/22/2024 1000 Gross per 24 hour  Intake 1066.73 ml  Output 3503 ml  Net -2436.27 ml     Acute respiratory failure Respiratory pathogen's panel by PCR is negative.  Blood cultures from 03/06/2024 are negative. Required bipap overnight, now weaned to 4L [place]  Anemia of CKD.  Decent Hgb decline, 7.6. Can consider ESA during this admission.    LOS: [address] 11/28/202510:58 AM  [address] [place], [place] [phone]

## 2024-03-22 NOTE — Plan of Care (Signed)
  Problem: Clinical Measurements: Goal: Ability to maintain clinical measurements within normal limits will improve Outcome: Progressing Goal: Will remain free from infection Outcome: Progressing Goal: Respiratory complications will improve Outcome: Progressing   Problem: Education: Goal: Knowledge of General Education information will improve Description: Including pain rating scale, medication(s)/side effects and non-pharmacologic comfort measures Outcome: Not Progressing   Problem: Clinical Measurements: Goal: Diagnostic test results will improve Outcome: Not Progressing   Problem: Activity: Goal: Risk for activity intolerance will decrease Outcome: Not Progressing   Problem: Nutrition: Goal: Adequate nutrition will be maintained Outcome: Not Progressing   Problem: Coping: Goal: Level of anxiety will decrease Outcome: Not Progressing

## 2024-03-22 NOTE — Plan of Care (Signed)
 Patient alert with confusion to time. No complaints of pain throughout shift. Patient on 4 liters of oxygen . Patient has had decreased appetite throughout shift. External catheter in place. CRRT running with no complications. Titrating levophed  to maintain map of 65 or greater. Continue to assess.

## 2024-03-22 NOTE — Consult Note (Signed)
 Consultation Note Date: 03/22/2024 at 1000  Patient Name: Dave Brown  DOB: 06/30/1945  MRN: [id number]  Age / Sex: 78 y.o., male  PCP: Debrah Josette ORN., PA-C Referring Physician: Isaiah Scrivener, MD  HPI/Patient Profile: 78 y.o. 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  Patient presented to [facility] ED 03/08/2024 c/o shortness of breath x 1 day with lower extremity edema and orthopnea.  Patient was found to have acute compensated HFpEF with AKI.   ED labs showed WBC 11, Hgb 12.3, platelets 138, sodium 141, potassium 4.1, bicarb 31, troponin 178, creatinine 4.15, BUN 46, GFR 14, BNP 16,139, lactic 1.9 => 0.8, COVID negative.  Chest x-ray showed patchy opacities right lower lobe and left lower lobe ED initial vital signs 88/50, HR 60, RR 28, SpO2 100% on 15 L NRB, 98.4 F  He was started on Levophed  and admitted to critical care.  Patient required transfer to [facility] for initiation of CRRT.  On 11/15 patient had seizure-like episode and was intubated. Patient was extubated to BiPAP 03/15/2024.  Nephrology and cardiology following as well.  Patient remains in ICU requiring CRRT and pressors for support.  Of note he had recent admission 02/01/2024 to 02/05/2024 for respiratory failure due to CHF.  He did not require oxygen  at the time of his discharge.  Palliative medicine was consulted to assist with goals of care conversations.  Clinical Assessment and Goals of Care: Extensive chart review completed prior to meeting patient including labs, vital signs, imaging, progress notes, orders, and available advanced directive documents from current and previous encounters. I then met with patient at bedside and spoke with [name] (daughter) via telephone to discuss diagnosis prognosis, GOC, EOL wishes, disposition and options.  Was  unable to assess or discussed with patient due to extreme [facility].     Latest Ref Rng & Units 03/22/2024    3:26 AM 03/21/2024    4:13 AM 03/20/2024    5:00 AM  CBC  WBC 4.0 - 10.5 K/uL 10.2  18.7  17.2   Hemoglobin 13.0 - 17.0 g/dL 7.6  9.3  9.9   Hematocrit 39.0 - 52.0 % 23.6  29.6  31.6   Platelets 150 - 400 K/uL 147  185  193       Latest Ref Rng & Units 03/22/2024    3:13 PM 03/22/2024    3:26 AM 03/21/2024    4:55 PM  CMP  Glucose 70 - 99 mg/dL 857  845  99   BUN 8 - 23 mg/dL 22  29  37   Creatinine 0.61 - 1.24 mg/dL 8.01  7.66  7.12   Sodium 135 - 145 mmol/L 136  133  137   Potassium 3.5 - 5.1 mmol/L 4.3  3.9  4.2   Chloride 98 - 111 mmol/L 103  101  102   CO2 22 - 32 mmol/L 25  25  26    Calcium  8.9 -  10.3 mg/dL 8.5  8.5  9.0      Elderly, ill-appearing, obese male lying in bed.  He is alert but has extreme difficulty answering questions due to hard of hearing.   He does state his stomach feels bloated after drinking too much water .  He denies pain, chest pain or shortness of breath.   I spoke with [name] on the phone and introduced Palliative Medicine as specialized medical care for people living with serious illness. It focuses on providing relief from the symptoms and stress of a serious illness. The goal is to improve quality of life for both the patient and the family.  We discussed a brief life review of the patient.  [name] shares her father resides at home and lives with his son. Her father is widowed after being married over 40 years to her mother.  They had 4 children including Kim and 2 brothers.  1 child is deceased.  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  As far as functional and nutritional status To this admission he was independent of ADLs to include driving.  He does not use DME.  She reports he had normal appetite.  We discussed patient's current illness and what it means in the larger context of patient's on-going co-morbidities.  Natural  disease trajectory and expectations at EOL were discussed.  The difference between aggressive medical intervention and comfort care was considered in light of the patient's goals of care.   Advance directives, concepts specific to code status, artificial feeding and hydration, and rehospitalization were considered and discussed.  [name] confirms her father remains a full CODE STATUS utilizing all interventions for him to get better.  [name] shares that she has spoken with Dr. Isaiah and understands the critical nature of her father's illness including cardiac and renal failure and continuing high risk for reintubation for respiratory status.  [name] also shares that she understands her father will always need dialysis.  Education offered regarding concept specific to human mortality and the limitations of medical interventions to prolong life when the body begins to fail to thrive.  Family is facing treatment option decisions, advanced directive, and anticipatory care needs.  [name] agrees that her and family should meet to discuss goals of care further with palliative medicine team.  Will meet 03/23/2024 and ICU at 1600.   Discussed with patient/family the importance of continued conversation with family and the medical providers regarding overall plan of care and treatment options, ensuring decisions are within the context of the patient's values and GOCs.    Questions and concerns were addressed. The family was encouraged to call with questions or concerns.   Primary Decision Maker NEXT OF KIN  Physical Exam Constitutional:      General: He is not in acute distress.    Appearance: He is obese. He is ill-appearing.  HENT:     Head: Normocephalic and atraumatic.     Mouth/Throat:     Mouth: Mucous membranes are moist.  Pulmonary:     Effort: Pulmonary effort is normal. No respiratory distress.  Skin:    General: Skin is warm and dry.  Neurological:     Mental Status: He is alert.    Recommendations/Plan: FULL CODE status as previously documented    Continue current supportive interventions Position TBD  Palliative Assessment/Data: 30 to 40%     Thank you for this consult. Palliative medicine will continue to follow and assist holistically.   Time Total: 75 minutes  Time spent includes: Detailed review of medical records (labs, imaging, vital signs), medically appropriate exam (mental status, respiratory, cardiac, skin), discussed with treatment team, counseling and educating patient, family and staff, documenting clinical information, medication management and coordination of care.     Devere Sacks, ELNITA- [facility] Palliative Medicine Team  03/22/2024 8:32 AM  Office [phone]  Pager [phone]     Please contact Palliative Medicine Team providers via AMION for questions and concerns.

## 2024-03-22 NOTE — Progress Notes (Signed)
 PT Cancellation Note  Patient Details Name: Toron Bowring MRN: [id number] DOB: 03-05-1946   Cancelled Treatment:    Reason Eval/Treat Not Completed: Other (comment) (Patient back on CRRT with groin access. Will hold on out of bed mobility until appropriate. Discussed with nurse and OT.)  [name], PT, MPT  [name] 03/22/2024, 12:39 PM

## 2024-03-23 LAB — CBC
HCT: 24.5 % — ABNORMAL LOW (ref 39.0–52.0)
Hemoglobin: 7.7 g/dL — ABNORMAL LOW (ref 13.0–17.0)
MCH: 31.7 pg (ref 26.0–34.0)
MCHC: 31.4 g/dL (ref 30.0–36.0)
MCV: 100.8 fL — ABNORMAL HIGH (ref 80.0–100.0)
Platelets: 146 K/uL — ABNORMAL LOW (ref 150–400)
RBC: 2.43 MIL/uL — ABNORMAL LOW (ref 4.22–5.81)
RDW: 14.2 % (ref 11.5–15.5)
WBC: 8.3 K/uL (ref 4.0–10.5)
nRBC: 0 % (ref 0.0–0.2)

## 2024-03-23 LAB — RENAL FUNCTION PANEL
Albumin: 3 g/dL — ABNORMAL LOW (ref 3.5–5.0)
Albumin: 3 g/dL — ABNORMAL LOW (ref 3.5–5.0)
Anion gap: 7 (ref 5–15)
Anion gap: 7 (ref 5–15)
BUN: 22 mg/dL (ref 8–23)
BUN: 20 mg/dL (ref 8–23)
CO2: 25 mmol/L (ref 22–32)
CO2: 27 mmol/L (ref 22–32)
Calcium: 8.6 mg/dL — ABNORMAL LOW (ref 8.9–10.3)
Calcium: 8.8 mg/dL — ABNORMAL LOW (ref 8.9–10.3)
Chloride: 102 mmol/L (ref 98–111)
Chloride: 102 mmol/L (ref 98–111)
Creatinine, Ser: 1.91 mg/dL — ABNORMAL HIGH (ref 0.61–1.24)
Creatinine, Ser: 1.81 mg/dL — ABNORMAL HIGH (ref 0.61–1.24)
GFR, Estimated: 35 mL/min — ABNORMAL LOW (ref >60)
GFR, Estimated: 38 mL/min — ABNORMAL LOW (ref >60)
Glucose, Bld: 156 mg/dL — ABNORMAL HIGH (ref 70–99)
Glucose, Bld: 106 mg/dL — ABNORMAL HIGH (ref 70–99)
Phosphorus: 2.8 mg/dL (ref 2.5–4.6)
Phosphorus: 1.9 mg/dL — ABNORMAL LOW (ref 2.5–4.6)
Potassium: 4.7 mmol/L (ref 3.5–5.1)
Potassium: 3.8 mmol/L (ref 3.5–5.1)
Sodium: 134 mmol/L — ABNORMAL LOW (ref 135–145)
Sodium: 136 mmol/L (ref 135–145)

## 2024-03-23 LAB — GLUCOSE, CAPILLARY
Glucose-Capillary: 105 mg/dL — ABNORMAL HIGH (ref 70–99)
Glucose-Capillary: 115 mg/dL — ABNORMAL HIGH (ref 70–99)
Glucose-Capillary: 124 mg/dL — ABNORMAL HIGH (ref 70–99)
Glucose-Capillary: 130 mg/dL — ABNORMAL HIGH (ref 70–99)
Glucose-Capillary: 130 mg/dL — ABNORMAL HIGH (ref 70–99)
Glucose-Capillary: 145 mg/dL — ABNORMAL HIGH (ref 70–99)
Glucose-Capillary: 159 mg/dL — ABNORMAL HIGH (ref 70–99)

## 2024-03-23 LAB — MAGNESIUM: Magnesium: 2.3 mg/dL (ref 1.7–2.4)

## 2024-03-23 MED ORDER — EPOETIN ALFA-EPBX 10000 UNIT/ML IJ SOLN
20000.0000 [IU] | INTRAMUSCULAR | Status: DC
  Administered 2024-03-23 – 2024-03-30 (×2): 20000 [IU] via SUBCUTANEOUS
  Filled 2024-03-23 (×5): qty 2

## 2024-03-23 MED ORDER — POTASSIUM PHOSPHATES 15 MMOLE/5ML IV SOLN
30.0000 mmol | Freq: Once | INTRAVENOUS | Status: AC
  Administered 2024-03-23: 30 mmol via INTRAVENOUS
  Filled 2024-03-23: qty 10

## 2024-03-23 NOTE — Progress Notes (Addendum)
 NAME:  Cullin Dishman, MRN:  [id number], DOB:  20-Nov-1945, LOS: 15 ADMISSION DATE:  03/08/2024, CONSULTATION DATE:  03/08/2024  Chief complaint Follow-up respiratory failure shock   Brief Pt Description / Synopsis:  78 y.o. male admitted with Acute on Chronic HFpEF, Cardiogenic/Septic shock, AKI, Acute Hypoxic Respiratory Failure, and MRSA Pneumonia requiring mechanical ventilation and initiation of CRRT.   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 He denied any fevers, chills, chest pain.     Of note, the patient was recently admitted to the hospital from 02/01/2024 to 02/05/2024 for respiratory failure due to CHF.  The patient did not require oxygen  at the time of his discharge.  His discharge weight was 286.  At his last office visit with his primary provider his weight was 291 pounds on 02/22/24.  Follow up with pulmonary on 03/04/24 office weight 29.   He was discharged home with furosemide  40 mg daily during last hospitalization  Imaging Chest X-ray>>patchy opacities right lower lobe and left lower lobe     Pertinent  Medical History   Past Medical History:  Diagnosis Date   CHF (congestive heart failure) (HCC)    Degenerative joint disease    High cholesterol    Hypertension    Kidney stones    Obesity    Respiratory failure with hypoxia (HCC)    Sleep apnea     Micro Data:  11/12: Blood cultures x2>> no growth 11/13: MRSA PCR>> negative 11/13: RVP>> negative 11/24: MRSA PCR>> negative 11/18: Tracheal Aspirate>> MRSA 11/19: MRSA PCR +   Antimicrobials:   Anti-infectives (From admission, onward)    Start     Dose/Rate Route Frequency Ordered Stop   03/21/24 1015   linezolid  (ZYVOX ) IVPB 600 mg        600 mg 300 mL/hr over 60 Minutes Intravenous Every 12 hours 03/21/24 0918 03/25/24 2359   03/17/24 1300  vancomycin  (VANCOCIN ) IVPB 1000 mg/200 mL premix        1,000 mg 200 mL/hr over 60 Minutes Intravenous  Once 03/17/24 1147 03/17/24 1420   03/16/24 1200  vancomycin  (VANCOCIN ) IVPB 1000 mg/200 mL premix        1,000 mg 200 mL/hr over 60 Minutes Intravenous  Once 03/16/24 1104 03/16/24 2100   03/15/24 2200  piperacillin -tazobactam (ZOSYN ) IVPB 3.375 g        3.375 g 12.5 mL/hr over 240 Minutes Intravenous Every 8 hours 03/15/24 1412 03/17/24 0128   03/15/24 1600  vancomycin  (VANCOREADY) IVPB 1500 mg/300 mL        1,500 mg 150 mL/hr over 120 Minutes Intravenous  Once 03/15/24 1411 03/15/24 1705   03/15/24 1411  vancomycin  variable dose per unstable renal function (pharmacist dosing)  Status:  Discontinued         Does not apply See admin instructions 03/15/24 1411 03/19/24 1107   03/15/24 1400  vancomycin  (VANCOREADY) IVPB 1500 mg/300 mL  Status:  Discontinued        1,500 mg 150 mL/hr over 120 Minutes Intravenous Every 24 hours 03/14/24 1139 03/15/24 1411   03/14/24 1200  vancomycin  (VANCOREADY) IVPB 2000 mg/400 mL        2,000 mg 200 mL/hr over 120  Minutes Intravenous  Once 03/14/24 1012 03/14/24 1405   03/12/24 1200  piperacillin -tazobactam (ZOSYN ) IVPB 3.375 g  Status:  Discontinued        3.375 g 100 mL/hr over 30 Minutes Intravenous Every 6 hours 03/12/24 1013 03/15/24 1412        Significant Hospital Events: Including procedures, antibiotic start and stop dates in addition to other pertinent events   11/12: Admitted to [facility] [place] for treatment of Acute Decompensated HFpEF and AKI. 11/13: BP decreased, started on Levophed .  PCCM consulted.  ABX discontinued. 11/14: Transfer to [facility] for initiation of CRRT.  11/15: seizure like episode, intubated 11/16: Unable to wean; PICC line placed 11/21: Extubated to Bipap. Added Dobutamine .   11/23: Patient remains on bipap this AM. WBC stable at 7.6. H&H Stable. Coox SvO2 67%.  Electrolytes within normal range. NE decreased to 4. Remains on Dobutamin at 5.  11/24: Off BiPAP, awake and alert but remains encephalopathic.  Afebrile, Remains on Levophed , currently 10 mcg, weaning as tolerated.  Creatinine increased to 2.9, but UOP 1.4 L (net - 7.8L), Nephrology holding off on HD today. 11/25: No significant events overnight.  Midodrine  started overnight, now off Levophed .   Precedex  weaned off yesterday, remains encephalopathic but slowly improving. On 4L [place]. Creatinine worsened to 3.6, UOP 675 cc last 24 hrs (net - 8.4L), plan for HD today.  Plan for aggressive pulmonary toilet and mobilization as able.  11/26: Remains critically ill; desatted to the 80s overnight, placed on bipap and developed hypotension requiring initiation of levophed  gtt again. Hypercapnic overnight, will recheck vbg. Nephrology following, placed back on CRRT.  11/27: No acute events overnight, afebrile, remains on Levophed  and CRRT.  Worsening Leukocytosis, obtain Sputum culture and start Linezolid .  Weaned off BiPAP to [place]. 11/28 remains on CRRT remains on pressors, daughter updated over the phone  Interim History / Subjective:  Critically ill Morbidly obese Remains on pressors CVL placed Remains on CRRT High risk for aspiration cardiac arrest intubation    Objective   Blood pressure (!) 107/55, pulse 76, temperature 97.9 F (36.6 C), temperature source Axillary, resp. rate (!) 28, height 5' 9.02 (1.753 m), weight 126.3 kg, SpO2 94%.    FiO2 (%):  [35 %] 35 %   Intake/Output Summary (Last 24 hours) at 03/23/2024 0748 Last data filed at 03/23/2024 0600 Gross per 24 hour  Intake 1057.27 ml  Output 3215.5 ml  Net -2158.23 ml   Filed Weights   03/21/24 0500 03/22/24 0407 03/23/24 0438  Weight: 124.2 kg 126.1 kg 126.3 kg   Physical examination Lethargic this a.m. 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 in the setting of morbid obesity with MRSA pneumonia with severe septic shock  Acute hypoxic respiratory failure due to MRSA pneumonia and pulmonary edema High risk for aspiration high risk for reintubation Oxygen  as needed BiPAP at night Chest x-ray and ABG as needed Risk for aspiration   Shock combination sepsis and cardiogenic Continue pressors to maintain MAP goal Continue midodrine  Follow-up cardiology recommendations   Acute on chronic heart failure preserved EF cardiac failure Shock, history of paroxysmal A-fib -Echocardiogram 03/16/24: LVEF >75%, grade I DD, RV systolic function normal, RV size normal -Holding Heparin  gtt due to mild hemoptysis/hematuria  INFECTIOUS DISEASE-MRSA pneumonia -continue antibiotics as prescribed-restart antibiotics for MRSA with linezolid      ACUTE KIDNEY INJURY/Renal Failure I am concerned this has progressed to end-stage disease currently on CRRT Remains on CRRT No significant urine output Renal dose medications   Intake/Output Summary (Last 24 hours) at 03/23/2024 0748 Last data filed at 03/23/2024 0600 Gross per 24 hour  Intake 1057.27 ml  Output 3215.5 ml  Net -2158.23 ml    ENDO - ICU hypoglycemic\Hyperglycemia protocol -check FSBS per protocol   GI GI PROPHYLAXIS as indicated NUTRITIONAL STATUS DIET--> as tolerated Constipation protocol as indicated Aspiration precautions  ELECTROLYTES -follow labs as needed -replace as needed -pharmacy consultation and following  RESTRICTIVE TRANSFUSION PROTOCOL TRANSFUSION  IF HGB<7  or ACTIVE BLEEDING OR DX of ACUTE CORONARY SYNDROMES     ENDO - ICU  hypoglycemic\Hyperglycemia protocol -check FSBS per protocol   GI GI PROPHYLAXIS as indicated NUTRITIONAL STATUS DIET-->as tolerated Constipation protocol as indicated   ELECTROLYTES -follow labs as needed -replace as needed -pharmacy consultation and following  RESTRICTIVE TRANSFUSION PROTOCOL TRANSFUSION  IF HGB<7  or ACTIVE BLEEDING OR DX of ACUTE CORONARY SYNDROMES        Best Practice (right click and Reselect all SmartList Selections daily)   Diet/type: Regular consistency (see orders) DVT prophylaxis: SCD GI prophylaxis: PPI Lines: Central line, Dialysis Catheter, and yes and it is still needed Foley:  N/A Code Status:  full code Last date of multidisciplinary goals of care discussion [11/25]  11/25: Will update pt's family when they arrive at bedside on plan of care. 11/28-daughter [name] updated over the phone  Labs   CBC: Recent Labs  Lab 03/19/24 0359 03/20/24 0500 03/21/24 0413 03/22/24 0326 03/23/24 0356  WBC 8.7 17.2* 18.7* 10.2 8.3  HGB 9.1* 9.9* 9.3* 7.6* 7.7*  HCT 28.5* 31.6* 29.6* 23.6* 24.5*  MCV 100.4* 103.6* 102.1* 100.4* 100.8*  PLT 141* 193 185 147* 146*    Basic Metabolic Panel: Recent Labs  Lab 03/19/24 0359 03/20/24 0500 03/21/24 0413 03/21/24 1655 03/22/24 0326 03/22/24 1513 03/23/24 0356  NA 136 135 137 137 133* 136 134*  K 3.8 4.9 4.6 4.2 3.9 4.3 4.7  CL 102 99 102 102 101 103 102  CO2 23 26 24 26 25 25 25   GLUCOSE 103* 131* 136* 99 154* 142* 156*  BUN 42* 41* 43* 37* 29* 22 22  CREATININE 3.61* 3.47* 3.22* 2.87* 2.33* 1.98* 1.91*  CALCIUM  9.9 9.7 9.2 9.0 8.5* 8.5* 8.6*  MG 2.3 2.3 2.2  --  2.3  --  2.3  PHOS 5.0* 7.1* 5.4* 4.0 2.4* 2.5 2.8   GFR: Estimated Creatinine Clearance: 41.9 mL/min (A) (by C-G formula based on SCr of 1.91 mg/dL (H)). Recent Labs  Lab 03/18/24 1356 03/19/24 0359 03/20/24 0500 03/21/24 0413 03/21/24 0907 03/22/24 0326 03/23/24 0356  WBC  --    < > 17.2* 18.7*  --  10.2 8.3   LATICACIDVEN 0.8  --   --   --  1.5  --   --    < > = values in this interval not displayed.    Liver Function Tests: Recent Labs  Lab 03/21/24 0413 03/21/24 1655 03/22/24 0326 03/22/24 1513 03/23/24 0356  ALBUMIN 3.2* 3.0* 2.9* 3.0* 3.0*   No results for input(s): LIPASE, AMYLASE in the last 168 hours. No results for input(s): AMMONIA in the last 168 hours.  ABG    Component Value Date/Time   PHART 7.41 03/16/2024 1412   PCO2ART 41  03/16/2024 1412   PO2ART 167 (H) 03/16/2024 1412   HCO3 25.4 03/20/2024 1210   ACIDBASEDEF 3.9 (H) 03/20/2024 1210   O2SAT 74.7 03/20/2024 1210     Coagulation Profile: No results for input(s): INR, PROTIME in the last 168 hours.  Cardiac Enzymes: No results for input(s): CKTOTAL, CKMB, CKMBINDEX, TROPONINI in the last 168 hours.  HbA1C: Hgb A1c MFr Bld  Date/Time Value Ref Range Status  03/08/2024 03:11 AM 5.2 4.8 - 5.6 % Final    Comment:    (NOTE) Diagnosis of Diabetes The following HbA1c ranges recommended by the American Diabetes Association (ADA) may be used as an aid in the diagnosis of diabetes mellitus.  Hemoglobin             Suggested A1C NGSP%              Diagnosis  <5.7                   Non Diabetic  5.7-6.4                Pre-Diabetic  >6.4                   Diabetic  <7.0                   Glycemic control for                       adults with diabetes.    05/02/2020 02:37 PM 6.9 (H) 4.8 - 5.6 % Final    Comment:    (NOTE) Pre diabetes:          5.7%-6.4%  Diabetes:              >6.4%  Glycemic control for   <7.0% adults with diabetes     CBG: Recent Labs  Lab 03/22/24 0733 03/22/24 1109 03/22/24 1920 03/23/24 0003 03/23/24 0410  GLUCAP 138* 130* 157* 145* 159*     DVT/GI PRX  assessed I Assessed the need for Labs I Assessed the need for Foley I Assessed the need for Central Venous Line Family Discussion when available I Assessed the need for Mobilization I made an  Assessment of medications to be adjusted accordingly Safety Risk assessment completed  CASE DISCUSSED IN MULTIDISCIPLINARY ROUNDS WITH ICU TEAM     Critical Care Time devoted to patient care services described in this note is 55 minutes.  Critical care was necessary to treat /prevent imminent and life-threatening deterioration. Overall, patient is critically ill, prognosis is guarded.  Patient with Multiorgan failure and at high risk for cardiac arrest and death.      Overall prognosis is poor in the setting of advanced age morbid obesity multiorgan failure I do not anticipate patient going home Patient will require long-term care, at this time still has acute ICU illness    Aryonna Gunnerson Alm Cellar, M.D.  [facility]  Medical Director [facility]

## 2024-03-23 NOTE — Consult Note (Signed)
 PHARMACY CONSULT NOTE - ELECTROLYTES  Pharmacy Consult for Electrolyte Monitoring and Replacement   Recent Labs: Height: 5' 9.02 (175.3 cm) Weight: 126.3 kg (278 lb 7.1 oz) IBW/kg (Calculated) : 70.74 Estimated Creatinine Clearance: 44.2 mL/min (A) (by C-G formula based on SCr of 1.81 mg/dL (H)). Potassium  Date Value  03/23/2024 3.8 mmol/L  07/18/2013 3.4 mEq/L (L)   Magnesium (mg/dL)  Date Value  88/70/7974 2.3   Calcium  (mg/dL)  Date Value  88/70/7974 8.8 (L)  07/18/2013 10.1   Albumin (g/dL)  Date Value  88/70/7974 3.0 (L)  07/18/2013 3.8   Phosphorus (mg/dL)  Date Value  88/70/7974 1.9 (L)   Sodium  Date Value  03/23/2024 136 mmol/L  07/18/2013 138 mEq/L   Assessment  Dave Brown is a 78 y.o. male presenting with AKI requiring CRRT. PMH significant for HFpEF and COPD requiring 2L supplemental oxygen . Pharmacy has been consulted to monitor and replace electrolytes.  Goal of Therapy: Electrolytes WNL  Plan:  Patient initiated on CRRT on 11/27 Phos = 1.9 on 11/29 @ 1522 Will replace with 30 mmol of [place]ph[place] x1 Twice daily renal function panel while on CRRT, Recheck all electrolytes tomorrow morning  Thank you for allowing pharmacy to be a part of this patient's care.  Annabella LOISE Banks 03/23/2024 5:52 PM

## 2024-03-23 NOTE — Plan of Care (Signed)
  Problem: Clinical Measurements: Goal: Ability to maintain clinical measurements within normal limits will improve Outcome: Progressing Goal: Will remain free from infection Outcome: Progressing Goal: Diagnostic test results will improve Outcome: Progressing Goal: Respiratory complications will improve Outcome: Progressing   Problem: Pain Managment: Goal: General experience of comfort will improve and/or be controlled Outcome: Progressing   Problem: Safety: Goal: Ability to remain free from injury will improve Outcome: Progressing   Problem: Skin Integrity: Goal: Risk for impaired skin integrity will decrease Outcome: Progressing

## 2024-03-23 NOTE — Progress Notes (Addendum)
 Palliative Care Progress Note, Assessment & Plan   Patient Name: Dave Brown       Date: 03/23/2024 DOB: [birth year]  Age: 78 y.o. MRN#: [id number] Attending Physician: Isaiah Scrivener, MD Primary Care Physician: Debrah Josette ORN., PA-C Admit Date: 03/08/2024  Subjective: Denies pain. States he is eating some food. Endorses SOB but no CP.   HPI: 78 y.o. 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  Patient presented to [facility] ED 03/08/2024 c/o shortness of breath x 1 day with lower extremity edema and orthopnea.  Patient was found to have acute compensated HFpEF with AKI.   ED labs showed WBC 11, Hgb 12.3, platelets 138, sodium 141, potassium 4.1, bicarb 31, troponin 178, creatinine 4.15, BUN 46, GFR 14, BNP 16,139, lactic 1.9 => 0.8, COVID negative.   Chest x-ray showed patchy opacities right lower lobe and left lower lobe ED initial vital signs 88/50, HR 60, RR 28, SpO2 100% on 15 L NRB, 98.4 F   He was started on Levophed  and admitted to critical care.  Patient required transfer to [facility] for initiation of CRRT.   On 11/15 patient had seizure-like episode and was intubated. Patient was extubated to BiPAP 03/15/2024.  Nephrology and cardiology following as well.  Patient remains in ICU requiring CRRT and pressors for support.   Of note he had recent admission 02/01/2024 to 02/05/2024 for respiratory failure due to CHF.  He did not require oxygen  at the time of his discharge.   Palliative medicine was consulted to assist with goals of care conversations.  Summary of counseling/coordination of care: Extensive chart review completed prior to meeting patient including labs, vital signs, imaging, progress notes,  orders, and available advanced directive documents from current and previous encounters.     Latest Ref Rng & Units 03/23/2024    3:56 AM 03/22/2024    3:26 AM 03/21/2024    4:13 AM  CBC  WBC 4.0 - 10.5 K/uL 8.3  10.2  18.7   Hemoglobin 13.0 - 17.0 g/dL 7.7  7.6  9.3   Hematocrit 39.0 - 52.0 % 24.5  23.6  29.6   Platelets 150 - 400 K/uL 146  147  185       Latest Ref Rng & Units 03/23/2024    3:22 PM 03/23/2024    3:56 AM 03/22/2024    3:13 PM  CMP  Glucose 70 - 99 mg/dL 893  843  857   BUN 8 - 23 mg/dL 20  22  22    Creatinine 0.61 - 1.24 mg/dL 8.18  8.08  8.01   Sodium 135 - 145 mmol/L 136  134  136   Potassium 3.5 - 5.1 mmol/L 3.8  4.7  4.3   Chloride 98 - 111 mmol/L 102  102  103   CO2 22 - 32 mmol/L 27  25  25    Calcium  8.9 - 10.3 mg/dL 8.8  8.6  8.5    After reviewing the patient's chart and assessing the patient at bedside, I spoke with patient in regards to symptom management and goals of care.   Ill-appearing, elderly, morbidly  obese male resting in bed with nursing staff at bedside. He is able to participate in conversation by speaking loudly into eat with hearing aid. He is alert to self and situation. He is in no distress.   Patient endorses wanting CPR and ventilator. He understands that he will most likely need to continue dialysis if he is able to go home. While discussing care, patient states to do whatever is needed.   Met with family patient daughter Dave Brown and Dave Brown (son) for family meeting to continue goals of care conversation. Both share that they want their father to have every chance to get better with the goal of going home. They endorse wanting CPR and ventilator if needed as their father requested. They understand if their father were to go back on ventilator, it may for extended time. They will decide at that time if they would do trach and PEG. Patient has support required at home to be able to continue dialysis outpatient.   Therapeutic silence and active  listening provided for family to share their thoughts and emotions regarding current medical situation.  Emotional support provided.  Physical Exam Vitals reviewed.  Constitutional:      General: He is not in acute distress.    Appearance: He is obese. He is ill-appearing.  HENT:     Head: Normocephalic and atraumatic.     Mouth/Throat:     Mouth: Mucous membranes are moist.  Pulmonary:     Effort: Pulmonary effort is normal. No respiratory distress.  Musculoskeletal:     Right lower leg: No edema.     Left lower leg: No edema.  Skin:    General: Skin is warm and dry.  Neurological:     Mental Status: He is alert and oriented to person, place, and time.  Psychiatric:        Mood and Affect: Mood normal.        Behavior: Behavior normal.   Recommendations/Plan: FULL CODE status as previously documented    Continue current supportive interventions Disposition TBD Palliative will continue to follow        Total Time 65 minutes   Discussed plan of care with primary RN  Time spent includes: Detailed review of medical records (labs, imaging, vital signs), medically appropriate exam (mental status, respiratory, cardiac, skin), discussed with treatment team, counseling and educating patient, family and staff, documenting clinical information, medication management and coordination of care.     Devere Sacks, ELNITA- [facility] Palliative Medicine Team  03/23/2024 8:05 AM  Office [phone]  Pager [phone]

## 2024-03-23 NOTE — Progress Notes (Signed)
 [facility] [place], [place] 03/23/24  Subjective:   Hospital day # 36 78 year old male with hypertension, heart failure with preserved ejection fraction, hyperlipidemia, stage IV CKD, obstructive sleep apnea, MGUS, chronic respiratory failure on 2 L oxygen  presented from [facility] with shortness of breath.  Transferred to [facility] for need of continuous dialysis.  Update:  Remains in ICU Alert and oriented Requires 4L [place], Bipap overnight Levo in place, weaning in progress CRRT, -147ml/hr  Urine output  Objective:  Vital signs in last 24 hours:  Temp:  [97.9 F (36.6 C)-98.7 F (37.1 C)] 98 F (36.7 C) (11/29 0800) Pulse Rate:  [67-92] 79 (11/29 1145) Resp:  [18-31] 21 (11/29 1145) BP: (75-133)/(47-96) 110/68 (11/29 1145) SpO2:  [87 %-100 %] 96 % (11/29 1145) FiO2 (%):  [35 %] 35 % (11/28 2126) Weight:  [126.3 kg] 126.3 kg (11/29 0438)  Weight change: 0.2 kg Filed Weights   03/21/24 0500 03/22/24 0407 03/23/24 0438  Weight: 124.2 kg 126.1 kg 126.3 kg    Intake/Output:    Intake/Output Summary (Last 24 hours) at 03/23/2024 1203 Last data filed at 03/23/2024 1200 Gross per 24 hour  Intake 1026.4 ml  Output 2800.5 ml  Net -1774.1 ml     Physical Exam: General: Ill-appearing elderly  Pulm/lungs Scattered rhonchi, [place] O2  CVS/Heart S1S2 no rubs  Abdomen:  Distended, nontender  Extremities: 2+ bilateral lower extremity edema  Neurologic: Alert, hard of hearing  Skin: Warm, dry  Access: Left femoral temporary dialysis catheter       Basic Metabolic Panel:  Recent Labs  Lab 03/19/24 0359 03/20/24 0500 03/21/24 0413 03/21/24 1655 03/22/24 0326 03/22/24 1513 03/23/24 0356  NA 136 135 137 137 133* 136 134*  K 3.8 4.9 4.6 4.2 3.9 4.3 4.7  CL 102 99 102 102 101 103 102  CO2 23 26 24 26 25 25 25   GLUCOSE 103* 131* 136* 99 154* 142* 156*  BUN 42* 41* 43* 37* 29* 22 22  CREATININE 3.61* 3.47* 3.22* 2.87* 2.33* 1.98* 1.91*   CALCIUM  9.9 9.7 9.2 9.0 8.5* 8.5* 8.6*  MG 2.3 2.3 2.2  --  2.3  --  2.3  PHOS 5.0* 7.1* 5.4* 4.0 2.4* 2.5 2.8     CBC: Recent Labs  Lab 03/19/24 0359 03/20/24 0500 03/21/24 0413 03/22/24 0326 03/23/24 0356  WBC 8.7 17.2* 18.7* 10.2 8.3  HGB 9.1* 9.9* 9.3* 7.6* 7.7*  HCT 28.5* 31.6* 29.6* 23.6* 24.5*  MCV 100.4* 103.6* 102.1* 100.4* 100.8*  PLT 141* 193 185 147* 146*      Lab Results  Component Value Date   HEPBSAG NON REACTIVE 03/16/2024      Microbiology:  Recent Results (from the past 240 hours)  Expectorated Sputum Assessment w Gram Stain, Rflx to Resp Cult     Status: None   Collection Time: 03/21/24  5:20 PM   Specimen: Throat; Sputum  Result Value Ref Range Status   Specimen Description THROAT  Final   Special Requests NONE  Final   Sputum evaluation   Final    Sputum specimen not acceptable for testing.  Please recollect.   RECOLLECT [address]ITA [address] 03/21/2024 [facility] Performed at [facility], [address] [place], [place] [address]    Report Status 03/21/2024 FINAL  Final    Coagulation Studies: No results for input(s): LABPROT, INR in the last 72 hours.   Urinalysis: No results for input(s): COLORURINE, LABSPEC, PHURINE, GLUCOSEU, HGBUR, BILIRUBINUR, KETONESUR, PROTEINUR, UROBILINOGEN,  NITRITE, LEUKOCYTESUR in the last 72 hours.  Invalid input(s): APPERANCEUR    Imaging: No results found.      Medications:    anticoagulant sodium citrate      linezolid  (ZYVOX ) IV Stopped (03/23/24 1014)   norepinephrine  (LEVOPHED ) Adult infusion 3 mcg/min (03/23/24 1200)   prismasol  BGK 4/2.5 600 mL/hr at 03/22/24 1130   prismasol  BGK 4/2.5 Stopped (03/23/24 0444)   prismasol  BGK 4/2.5 2,000 mL/hr at 03/23/24 0907    budesonide  (PULMICORT ) nebulizer solution  0.25 mg Nebulization BID   Chlorhexidine  Gluconate Cloth  6 each Topical Q0600   heparin  injection (subcutaneous)  5,000 Units Subcutaneous Q8H    ipratropium-albuterol   3 mL Nebulization BID   melatonin  2.5 mg Oral QHS   midodrine   10 mg Oral TID WC   multivitamin  1 tablet Oral QHS   nystatin   5 mL Oral QID   mouth rinse  15 mL Mouth Rinse 4 times per day   pantoprazole  (PROTONIX ) IV  40 mg Intravenous QHS   Ensure Max Protein  11 oz Oral TID   sertraline   25 mg Oral Daily   anticoagulant sodium citrate , diazepam , diazepam , docusate sodium , fentaNYL  (SUBLIMAZE ) injection, guaiFENesin -dextromethorphan , heparin , heparin , ipratropium-albuterol , ondansetron  (ZOFRAN ) IV, mouth rinse, phenol, polyethylene glycol, sodium chloride  flush, traZODone   Assessment/ Plan:  78 y.o. 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  Baseline creatinine of 2.5-2.8. Acute kidney injury is thought to be secondary to pneumonia and circulatory shock causing ATN.  Patient responded well to IV Lasix  03/13/24.  However due to low CVP, further doses were held. CRRT stopped 11/22. First HD 11/23.  Update:  Urine output . CRRT with -138ml/hr, tolerating well. Will continue through tomorrow and end CRRT tomorrow evening. Will attempt hemodialysis on Monday.   Lab Results  Component Value Date   CREATININE 1.91 (H) 03/23/2024   CREATININE 1.98 (H) 03/22/2024   CREATININE 2.33 (H) 03/22/2024     Intake/Output Summary (Last 24 hours) at 03/23/2024 1203 Last data filed at 03/23/2024 1200 Gross per 24 hour  Intake 1026.4 ml  Output 2800.5 ml  Net -1774.1 ml     Acute respiratory failure Respiratory pathogen's panel by PCR is negative.  Blood cultures from 03/06/2024 are negative. Bipap  overnight  Anemia of CKD.  Hgb  7.7. Will order retacrit 20000 units subcutaneous weekly.    LOS: 15 [facility] 11/29/202512:03 PM  Central [address] [place], [place] [phone]

## 2024-03-23 NOTE — Progress Notes (Signed)
 Changed CRRT filter at this time, unable to return blood.

## 2024-03-23 NOTE — Consult Note (Signed)
 PHARMACY CONSULT NOTE - ELECTROLYTES  Pharmacy Consult for Electrolyte Monitoring and Replacement   Recent Labs: Height: 5' 9.02 (175.3 cm) Weight: 126.3 kg (278 lb 7.1 oz) IBW/kg (Calculated) : 70.74 Estimated Creatinine Clearance: 41.9 mL/min (A) (by C-G formula based on SCr of 1.91 mg/dL (H)). Potassium  Date Value  03/23/2024 4.7 mmol/L  07/18/2013 3.4 mEq/L (L)   Magnesium (mg/dL)  Date Value  88/70/7974 2.3   Calcium  (mg/dL)  Date Value  88/70/7974 8.6 (L)  07/18/2013 10.1   Albumin (g/dL)  Date Value  88/70/7974 3.0 (L)  07/18/2013 3.8   Phosphorus (mg/dL)  Date Value  88/70/7974 2.8   Sodium  Date Value  03/23/2024 134 mmol/L (L)  07/18/2013 138 mEq/L   Assessment  Dave Brown is a 78 y.o. male presenting with AKI requiring CRRT. PMH significant for HFpEF and COPD requiring 2L supplemental oxygen . Pharmacy has been consulted to monitor and replace electrolytes.  Goal of Therapy: Electrolytes WNL  Plan:  Patient initiated on CRRT on 11/27 Twice daily renal function panel while on CRRT Recheck all electrolytes tomorrow morning  Thank you for allowing pharmacy to be a part of this patient's care.  [name] 03/23/2024 6:35 AM

## 2024-03-23 NOTE — Procedures (Signed)
 CENTRAL VENOUS CATHETER INSERTION PROCEDURE NOTE  Dave Brown  [id number]  October 02, 1945  Date:03/23/24  Time:6:04 AM   Provider Performing:Tanesia Butner A Ascencion Coye   Procedure: Insertion of Non-tunneled Central Venous [phone]) with US  guidance ([id number])   Indication(s) Hemodialysis  Consent Risks of the procedure as well as the alternatives and risks of each were explained to the patient and/or caregiver.  Consent for the procedure was obtained and is signed in the bedside chart  Anesthesia Topical only with 1% lidocaine    Timeout Verified patient identification, verified procedure, site/side was marked, verified correct patient position, special equipment/implants available, medications/allergies/relevant history reviewed, required imaging and test results available.  Sterile Technique Maximal sterile technique including full sterile barrier drape, hand hygiene, sterile gown, sterile gloves, mask, hair covering, sterile ultrasound probe cover (if used).  Procedure Description Area of catheter insertion was cleaned with chlorhexidine  and draped in sterile fashion.  With real-time ultrasound guidance a HD catheter was placed into the right femoral vein. Nonpulsatile blood flow and easy flushing noted in all ports.  The catheter was sutured in place and sterile dressing applied.  Complications/Tolerance None; patient tolerated the procedure well. Chest X-ray is ordered to verify placement for internal jugular or subclavian cannulation.   Chest x-ray is not ordered for femoral cannulation.  EBL Minimal  Specimen(s) None    Dave Nose DNP, CCRN, FNP-C, AGACNP-BC Acute Care & Family Nurse Practitioner [facility] P[facility]M on call pager [phone]

## 2024-03-24 LAB — GLUCOSE, CAPILLARY
Glucose-Capillary: 105 mg/dL — ABNORMAL HIGH (ref 70–99)
Glucose-Capillary: 103 mg/dL — ABNORMAL HIGH (ref 70–99)
Glucose-Capillary: 124 mg/dL — ABNORMAL HIGH (ref 70–99)
Glucose-Capillary: 101 mg/dL — ABNORMAL HIGH (ref 70–99)
Glucose-Capillary: 103 mg/dL — ABNORMAL HIGH (ref 70–99)
Glucose-Capillary: 110 mg/dL — ABNORMAL HIGH (ref 70–99)

## 2024-03-24 LAB — RENAL FUNCTION PANEL
Albumin: 3.1 g/dL — ABNORMAL LOW (ref 3.5–5.0)
Albumin: 3.1 g/dL — ABNORMAL LOW (ref 3.5–5.0)
Anion gap: 8 (ref 5–15)
Anion gap: 7 (ref 5–15)
BUN: 17 mg/dL (ref 8–23)
BUN: 18 mg/dL (ref 8–23)
CO2: 26 mmol/L (ref 22–32)
CO2: 26 mmol/L (ref 22–32)
Calcium: 8.5 mg/dL — ABNORMAL LOW (ref 8.9–10.3)
Calcium: 8.8 mg/dL — ABNORMAL LOW (ref 8.9–10.3)
Chloride: 101 mmol/L (ref 98–111)
Chloride: 101 mmol/L (ref 98–111)
Creatinine, Ser: 1.8 mg/dL — ABNORMAL HIGH (ref 0.61–1.24)
Creatinine, Ser: 2.13 mg/dL — ABNORMAL HIGH (ref 0.61–1.24)
GFR, Estimated: 38 mL/min — ABNORMAL LOW (ref >60)
GFR, Estimated: 31 mL/min — ABNORMAL LOW (ref >60)
Glucose, Bld: 99 mg/dL (ref 70–99)
Glucose, Bld: 103 mg/dL — ABNORMAL HIGH (ref 70–99)
Phosphorus: 3 mg/dL (ref 2.5–4.6)
Phosphorus: 3.6 mg/dL (ref 2.5–4.6)
Potassium: 4.3 mmol/L (ref 3.5–5.1)
Potassium: 4.6 mmol/L (ref 3.5–5.1)
Sodium: 135 mmol/L (ref 135–145)
Sodium: 135 mmol/L (ref 135–145)

## 2024-03-24 LAB — CBC
HCT: 23.5 % — ABNORMAL LOW (ref 39.0–52.0)
Hemoglobin: 7.6 g/dL — ABNORMAL LOW (ref 13.0–17.0)
MCH: 32.3 pg (ref 26.0–34.0)
MCHC: 32.3 g/dL (ref 30.0–36.0)
MCV: 100 fL (ref 80.0–100.0)
Platelets: 160 K/uL (ref 150–400)
RBC: 2.35 MIL/uL — ABNORMAL LOW (ref 4.22–5.81)
RDW: 14.3 % (ref 11.5–15.5)
WBC: 9 K/uL (ref 4.0–10.5)
nRBC: 0 % (ref 0.0–0.2)

## 2024-03-24 LAB — MAGNESIUM: Magnesium: 2.3 mg/dL (ref 1.7–2.4)

## 2024-03-24 MED ORDER — ALTEPLASE 2 MG IJ SOLR
2.0000 mg | Freq: Once | INTRAMUSCULAR | Status: AC
  Administered 2024-03-24: 2 mg

## 2024-03-24 MED ORDER — ALTEPLASE 2 MG IJ SOLR
2.0000 mg | Freq: Once | INTRAMUSCULAR | Status: DC
  Filled 2024-03-24: qty 2

## 2024-03-24 MED ORDER — ALTEPLASE 2 MG IJ SOLR
2.0000 mg | Freq: Once | INTRAMUSCULAR | Status: AC
  Administered 2024-03-24: 2 mg
  Filled 2024-03-24 (×2): qty 2

## 2024-03-24 MED ORDER — STERILE WATER FOR INJECTION IJ SOLN
INTRAMUSCULAR | Status: AC
  Filled 2024-03-24: qty 20

## 2024-03-24 NOTE — Consult Note (Signed)
 PHARMACY CONSULT NOTE - ELECTROLYTES  Pharmacy Consult for Electrolyte Monitoring and Replacement   Recent Labs: Height: 5' 9.02 (175.3 cm) Weight: 119.7 kg (263 lb 14.3 oz) IBW/kg (Calculated) : 70.74 Estimated Creatinine Clearance: 43.2 mL/min (A) (by C-G formula based on SCr of 1.8 mg/dL (H)). Potassium  Date Value  03/24/2024 4.3 mmol/L  07/18/2013 3.4 mEq/L (L)   Magnesium (mg/dL)  Date Value  88/69/7974 2.3   Calcium  (mg/dL)  Date Value  88/69/7974 8.5 (L)  07/18/2013 10.1   Albumin (g/dL)  Date Value  88/69/7974 3.1 (L)  07/18/2013 3.8   Phosphorus (mg/dL)  Date Value  88/69/7974 3.0   Sodium  Date Value  03/24/2024 135 mmol/L  07/18/2013 138 mEq/L   Assessment  Dave Brown is a 78 y.o. male presenting with AKI requiring CRRT. PMH significant for HFpEF and COPD requiring 2L supplemental oxygen . Pharmacy has been consulted to monitor and replace electrolytes.  Goal of Therapy: Electrolytes WNL  Plan:  Patient initiated on CRRT on 11/27, stopped today Twice daily renal function panel  Recheck all electrolytes tomorrow morning  Thank you for allowing pharmacy to be a part of this patient's care.  [name] 03/24/2024 7:06 AM

## 2024-03-24 NOTE — Progress Notes (Addendum)
 Dr. Marcelino at bedside. Per nephrology, stopping CRRT now.

## 2024-03-24 NOTE — Progress Notes (Signed)
 Received consult on behalf of pt. Per primary RN, two lumens (red & gray) on PICC were not working. Unable to flush or instill tpa in gray lumen. Instilled Tpa to red lumen and upon removal, lumen flushed easily with blood return. Gray lumen now flushable with blood return. Per primary RN, purple lumen on trialysis catheter also did not have blood return. Upon assessment, it flushed easily but did not have blood return. Instilled tpa. Upon removal of tpa, lumen still flushed easily but had minimal blood return based on pt position. Informed primary RN if at a later time there is no more blood return, a second dose of tpa could be given or if pt is going to remain off of dialysis then catheter removal should be discussed with primary team. Encouraged to reach back out to IV team with any other concerns or questions.

## 2024-03-24 NOTE — Progress Notes (Signed)
 [facility] [place], [place] 03/24/24  Subjective:   Hospital day # 70 78 year old male with hypertension, heart failure with preserved ejection fraction, hyperlipidemia, stage IV CKD, obstructive sleep apnea, MGUS, chronic respiratory failure on 2 L oxygen  presented from [facility] with shortness of breath.  Transferred to [facility] for need of continuous dialysis.  Update:  Remains in ICU Alert and oriented, eating breakfast Requires 4L [place], Bipap overnight Levo in place, weaning CRRT, -180ml/hr  Urine output  Objective:  Vital signs in last 24 hours:  Temp:  [97.7 F (36.5 C)-98.9 F (37.2 C)] 97.7 F (36.5 C) (11/30 0745) Pulse Rate:  [67-93] 87 (11/30 0845) Resp:  [15-34] 19 (11/30 0830) BP: (73-126)/(39-101) 126/71 (11/30 0845) SpO2:  [86 %-100 %] 98 % (11/30 0845) FiO2 (%):  [35 %] 35 % (11/29 2228) Weight:  [119.7 kg] 119.7 kg (11/30 0500)  Weight change: -6.6 kg Filed Weights   03/22/24 0407 03/23/24 0438 03/24/24 0500  Weight: 126.1 kg 126.3 kg 119.7 kg    Intake/Output:    Intake/Output Summary (Last 24 hours) at 03/24/2024 0956 Last data filed at 03/24/2024 0908 Gross per 24 hour  Intake 1844.27 ml  Output 4193 ml  Net -2348.73 ml     Physical Exam: General: Ill-appearing elderly  Pulm/lungs Scattered rhonchi, [place] O2  CVS/Heart S1S2 no rubs  Abdomen:  Distended, nontender  Extremities: 2+ bilateral lower extremity edema  Neurologic: Alert, hard of hearing  Skin: Warm, dry  Access: Left femoral temporary dialysis catheter       Basic Metabolic Panel:  Recent Labs  Lab 03/20/24 0500 03/21/24 0413 03/21/24 1655 03/22/24 0326 03/22/24 1513 03/23/24 0356 03/23/24 1522 03/24/24 0441  NA 135 137   < > 133* 136 134* 136 135  K 4.9 4.6   < > 3.9 4.3 4.7 3.8 4.3  CL 99 102   < > 101 103 102 102 101  CO2 26 24   < > 25 25 25 27 26   GLUCOSE 131* 136*   < > 154* 142* 156* 106* 99  BUN 41* 43*   < > 29* 22 22 20 17    CREATININE 3.47* 3.22*   < > 2.33* 1.98* 1.91* 1.81* 1.80*  CALCIUM  9.7 9.2   < > 8.5* 8.5* 8.6* 8.8* 8.5*  MG 2.3 2.2  --  2.3  --  2.3  --  2.3  PHOS 7.1* 5.4*   < > 2.4* 2.5 2.8 1.9* 3.0   < > = values in this interval not displayed.     CBC: Recent Labs  Lab 03/20/24 0500 03/21/24 0413 03/22/24 0326 03/23/24 0356 03/24/24 0441  WBC 17.2* 18.7* 10.2 8.3 9.0  HGB 9.9* 9.3* 7.6* 7.7* 7.6*  HCT 31.6* 29.6* 23.6* 24.5* 23.5*  MCV 103.6* 102.1* 100.4* 100.8* 100.0  PLT 193 185 147* 146* 160      Lab Results  Component Value Date   HEPBSAG NON REACTIVE 03/16/2024      Microbiology:  Recent Results (from the past 240 hours)  Expectorated Sputum Assessment w Gram Stain, Rflx to Resp Cult     Status: None   Collection Time: 03/21/24  5:20 PM   Specimen: Throat; Sputum  Result Value Ref Range Status   Specimen Description THROAT  Final   Special Requests NONE  Final   Sputum evaluation   Final    Sputum specimen not acceptable for testing.  Please recollect.   RECOLLECT CHIQUITA [address] 03/21/2024 [facility] Performed  at [facility], [address] [place], [place] [address]    Report Status 03/21/2024 FINAL  Final    Coagulation Studies: No results for input(s): LABPROT, INR in the last 72 hours.   Urinalysis: No results for input(s): COLORURINE, LABSPEC, PHURINE, GLUCOSEU, HGBUR, BILIRUBINUR, KETONESUR, PROTEINUR, UROBILINOGEN, NITRITE, LEUKOCYTESUR in the last 72 hours.  Invalid input(s): APPERANCEUR    Imaging: No results found.      Medications:    anticoagulant sodium citrate      linezolid  (ZYVOX ) IV Stopped (03/23/24 2254)   norepinephrine  (LEVOPHED ) Adult infusion 2 mcg/min (03/24/24 0900)   prismasol  BGK 4/2.5 600 mL/hr at 03/24/24 0110   prismasol  BGK 4/2.5 600 mL/hr at 03/24/24 0110   prismasol  BGK 4/2.5 2,000 mL/hr at 03/24/24 9153    Chlorhexidine  Gluconate Cloth  6 each Topical Q0600   epoetin  alfa-epbx (RETACRIT) injection  20,000 Units Subcutaneous Weekly   heparin  injection (subcutaneous)  5,000 Units Subcutaneous Q8H   melatonin  2.5 mg Oral QHS   midodrine   10 mg Oral TID WC   multivitamin  1 tablet Oral QHS   nystatin   5 mL Oral QID   mouth rinse  15 mL Mouth Rinse 4 times per day   pantoprazole  (PROTONIX ) IV  40 mg Intravenous QHS   Ensure Max Protein  11 oz Oral TID   sertraline   25 mg Oral Daily   anticoagulant sodium citrate , diazepam , diazepam , docusate sodium , fentaNYL  (SUBLIMAZE ) injection, guaiFENesin -dextromethorphan , heparin , heparin , ipratropium-albuterol , ondansetron  (ZOFRAN ) IV, mouth rinse, phenol, polyethylene glycol, sodium chloride  flush, traZODone   Assessment/ Plan:  78 y.o. 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  Baseline creatinine of 2.5-2.8. Acute kidney injury is thought to be secondary to pneumonia and circulatory shock causing ATN.  Patient responded well to IV Lasix  03/13/24.  However due to low CVP, further doses were held. CRRT stopped 11/22. First HD 11/23.  Update:  Urine output . CRRT with -160ml/hr, tolerating well. Will stop CRRT at this time. Will attempt hemodialysis on Monday.   Lab Results  Component Value Date   CREATININE 1.80 (H) 03/24/2024   CREATININE 1.81 (H) 03/23/2024   CREATININE 1.91 (H) 03/23/2024     Intake/Output Summary (Last 24 hours) at 03/24/2024 0956 Last data filed at 03/24/2024 0908 Gross per 24 hour  Intake 1844.27 ml  Output 4193 ml  Net -2348.73 ml     Acute respiratory failure Respiratory pathogen's panel by PCR is negative.   Blood cultures from 03/06/2024 are negative. Bipap overnight,  during the day  Anemia of CKD.  Hgb  7.6. Will order retacrit 20000 units subcutaneous weekly.    LOS: 16 Faith Harris 11/30/20259:56 AM  [address] [place], [place] [phone]

## 2024-03-24 NOTE — Progress Notes (Signed)
 Increasing pressures, CRRT stopped and blood returned, filter and effluent sets changed and CRRT restarted with new filter.

## 2024-03-24 NOTE — Plan of Care (Signed)
  Problem: Education: Goal: Knowledge of General Education information will improve Description: Including pain rating scale, medication(s)/side effects and non-pharmacologic comfort measures Outcome: Progressing   Problem: Clinical Measurements: Goal: Ability to maintain clinical measurements within normal limits will improve Outcome: Progressing Goal: Cardiovascular complication will be avoided Outcome: Progressing   Problem: Nutrition: Goal: Adequate nutrition will be maintained Outcome: Progressing   Problem: Coping: Goal: Level of anxiety will decrease Outcome: Progressing   Problem: Pain Managment: Goal: General experience of comfort will improve and/or be controlled Outcome: Progressing   Problem: Safety: Goal: Ability to remain free from injury will improve Outcome: Progressing   Problem: Skin Integrity: Goal: Risk for impaired skin integrity will decrease Outcome: Progressing   Problem: Metabolic: Goal: Ability to maintain appropriate glucose levels will improve Outcome: Progressing   Problem: Tissue Perfusion: Goal: Adequacy of tissue perfusion will improve Outcome: Progressing   Problem: Clinical Measurements: Goal: Complications related to the disease process or treatment will be avoided or minimized Outcome: Progressing Goal: Dialysis access will remain free of complications Outcome: Progressing   Problem: Activity: Goal: Activity intolerance will improve Outcome: Progressing   Problem: Fluid Volume: Goal: Fluid volume balance will be maintained or improved Outcome: Progressing

## 2024-03-24 NOTE — Progress Notes (Signed)
 Palliative Care Progress Note, Assessment & Plan   Patient Name: Dave Brown       Date: 03/24/2024 DOB: [birth year]  Age: 78 y.o. MRN#: [id number] Attending Physician: Isaiah Scrivener, MD Primary Care Physician: Debrah Josette ORN., PA-C Admit Date: 03/08/2024  Subjective: Feeling okay today. Denies pain, SOB or CP. Did not sleep well last night. Eating meals.   HPI: 78 y.o. 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  Patient presented to [facility] ED 03/08/2024 c/o shortness of breath x 1 day with lower extremity edema and orthopnea.  Patient was found to have acute compensated HFpEF with AKI.   ED labs showed WBC 11, Hgb 12.3, platelets 138, sodium 141, potassium 4.1, bicarb 31, troponin 178, creatinine 4.15, BUN 46, GFR 14, BNP 16,139, lactic 1.9 => 0.8, COVID negative.   Chest x-ray showed patchy opacities right lower lobe and left lower lobe ED initial vital signs 88/50, HR 60, RR 28, SpO2 100% on 15 L NRB, 98.4 F   He was started on Levophed  and admitted to critical care.  Patient required transfer to [facility] for initiation of CRRT.   On 11/15 patient had seizure-like episode and was intubated. Patient was extubated to BiPAP 03/15/2024.  Nephrology and cardiology following as well.  Patient remains in ICU requiring CRRT and pressors for support.   Of note he had recent admission 02/01/2024 to 02/05/2024 for respiratory failure due to CHF.  He did not require oxygen  at the time of his discharge.   Palliative medicine was consulted to assist with goals of care conversations.    Summary of counseling/coordination of care: Extensive chart review completed prior to meeting patient including labs, vital signs,  imaging, progress notes, orders, and available advanced directive documents from current and previous encounters.   After reviewing the patient's chart and assessing the patient at bedside, I spoke with patient in regards to symptom management and goals of care.      Latest Ref Rng & Units 03/24/2024    4:41 AM 03/23/2024    3:56 AM 03/22/2024    3:26 AM  CBC  WBC 4.0 - 10.5 K/uL 9.0  8.3  10.2   Hemoglobin 13.0 - 17.0 g/dL 7.6  7.7  7.6   Hematocrit 39.0 - 52.0 % 23.5  24.5  23.6   Platelets 150 - 400 K/uL 160  146  147       Latest Ref Rng & Units 03/24/2024    3:32 PM 03/24/2024    4:41 AM 03/23/2024    3:22 PM  CMP  Glucose 70 - 99 mg/dL 896  99  893   BUN 8 - 23 mg/dL 18  17  20    Creatinine 0.61 - 1.24 mg/dL 7.86  8.19  8.18   Sodium 135 - 145 mmol/L 135  135  136   Potassium 3.5 - 5.1 mmol/L 4.6  4.3  3.8   Chloride 98 - 111 mmol/L 101  101  102   CO2 22 - 32 mmol/L 26  26  27    Calcium  8.9 - 10.3 mg/dL 8.8  8.5  8.8  Ill-appearing, obese male lying in bed. He is alert to self, location and situation. He is able to participate in conversation. Respirations are even and unlabored. He is in no distress. No family or visitors at bedside.    Therapeutic silence and active listening provided for patient to share his thoughts and emotions regarding current medical situation.  Emotional support provided.  Physical Exam Vitals reviewed.  Constitutional:      General: He is not in acute distress.    Appearance: He is obese. He is ill-appearing.  HENT:     Head: Normocephalic and atraumatic.     Mouth/Throat:     Mouth: Mucous membranes are moist.  Pulmonary:     Effort: Pulmonary effort is normal. No respiratory distress.  Musculoskeletal:     Right lower leg: No edema.     Left lower leg: No edema.  Skin:    General: Skin is warm and dry.  Neurological:     Mental Status: He is alert and oriented to person, place, and time.  Psychiatric:        Mood and Affect:  Mood normal.        Behavior: Behavior normal.        Thought Content: Thought content normal.        Judgment: Judgment normal.      Recommendations/Plan: FULL CODE status as previously documented    Continue current supportive interventions Disposition TBD   PMT to follow peripherally and remain available for needs   Total Time 50 minutes   Time spent includes: Detailed review of medical records (labs, imaging, vital signs), medically appropriate exam (mental status, respiratory, cardiac, skin), discussed with treatment team, counseling and educating patient, family and staff, documenting clinical information, medication management and coordination of care.     Devere Sacks, AMANDA [facility] Palliative Medicine Team  03/24/2024 9:36 AM  Office [phone]  Pager [phone]

## 2024-03-24 NOTE — Progress Notes (Signed)
 CRRT stopped for filter change d/t increasing pressures. Returning blood to patient.

## 2024-03-24 NOTE — Progress Notes (Signed)
 NAME:  Dave Brown, MRN:  [id number], DOB:  November 06, 1945, LOS: 16 ADMISSION DATE:  03/08/2024, CONSULTATION DATE:  03/08/2024  Chief complaint Follow-up respiratory failure/shock   Brief Pt Description / Synopsis:  78 y.o. male admitted with Acute on Chronic HFpEF, Cardiogenic/Septic shock, AKI, Acute Hypoxic Respiratory Failure, and MRSA Pneumonia requiring mechanical ventilation and initiation of CRRT.   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 He denied any fevers, chills, chest pain.     Of note, the patient was recently admitted to the hospital from 02/01/2024 to 02/05/2024 for respiratory failure due to CHF.  The patient did not require oxygen  at the time of his discharge.  His discharge weight was 286.  At his last office visit with his primary provider his weight was 291 pounds on 02/22/24.  Follow up with pulmonary on 03/04/24 office weight 29.   He was discharged home with furosemide  40 mg daily during last hospitalization  Imaging Chest X-ray>>patchy opacities right lower lobe and left lower lobe MRSA PNEUMONIA    Pertinent  Medical History   Past Medical History:  Diagnosis Date   CHF (congestive heart failure) (HCC)    Degenerative joint disease    High cholesterol    Hypertension    Kidney stones    Obesity    Respiratory failure with hypoxia (HCC)    Sleep apnea     Micro Data:  11/12: Blood cultures x2>> no growth 11/13: MRSA PCR>> negative 11/13: RVP>> negative 11/24: MRSA PCR>> negative 11/18: Tracheal Aspirate>> MRSA 11/19: MRSA PCR +   Antimicrobials:   Anti-infectives (From admission, onward)    Start     Dose/Rate Route Frequency Ordered Stop    03/21/24 1015  linezolid  (ZYVOX ) IVPB 600 mg        600 mg 300 mL/hr over 60 Minutes Intravenous Every 12 hours 03/21/24 0918 03/25/24 2359   03/17/24 1300  vancomycin  (VANCOCIN ) IVPB 1000 mg/200 mL premix        1,000 mg 200 mL/hr over 60 Minutes Intravenous  Once 03/17/24 1147 03/17/24 1420   03/16/24 1200  vancomycin  (VANCOCIN ) IVPB 1000 mg/200 mL premix        1,000 mg 200 mL/hr over 60 Minutes Intravenous  Once 03/16/24 1104 03/16/24 2100   03/15/24 2200  piperacillin -tazobactam (ZOSYN ) IVPB 3.375 g        3.375 g 12.5 mL/hr over 240 Minutes Intravenous Every 8 hours 03/15/24 1412 03/17/24 0128   03/15/24 1600  vancomycin  (VANCOREADY) IVPB 1500 mg/300 mL        1,500 mg 150 mL/hr over 120 Minutes Intravenous  Once 03/15/24 1411 03/15/24 1705   03/15/24 1411  vancomycin  variable dose per unstable renal function (pharmacist dosing)  Status:  Discontinued         Does not apply See admin instructions 03/15/24 1411 03/19/24 1107   03/15/24 1400  vancomycin  (VANCOREADY) IVPB 1500 mg/300 mL  Status:  Discontinued        1,500 mg 150 mL/hr over 120 Minutes Intravenous Every 24 hours 03/14/24 1139 03/15/24 1411   03/14/24 1200  vancomycin  (VANCOREADY) IVPB 2000 mg/400 mL        2,000 mg 200 mL/hr over 120  Minutes Intravenous  Once 03/14/24 1012 03/14/24 1405   03/12/24 1200  piperacillin -tazobactam (ZOSYN ) IVPB 3.375 g  Status:  Discontinued        3.375 g 100 mL/hr over 30 Minutes Intravenous Every 6 hours 03/12/24 1013 03/15/24 1412        Significant Hospital Events: Including procedures, antibiotic start and stop dates in addition to other pertinent events   11/12: Admitted to [facility] to [place] for treatment of Acute Decompensated HFpEF and AKI. 11/13: BP decreased, started on Levophed .  PCCM consulted.  ABX discontinued. 11/14: Transfer to [facility] for initiation of CRRT.  11/15: seizure like episode, intubated 11/16: Unable to wean; PICC line placed 11/21: Extubated to Bipap.  Added Dobutamine .  11/23: Patient remains on bipap this AM. WBC stable at 7.6. H&H Stable. [place] SvO2 67%.  Electrolytes within normal range. NE decreased to 4. Remains on Dobutamin at 5.  11/24: Off BiPAP, awake and alert but remains encephalopathic.  Afebrile, Remains on Levophed , currently 10 mcg, weaning as tolerated.  Creatinine increased to 2.9, but UOP 1.4 L (net - 7.8L), Nephrology holding off on HD today. 11/25: No significant events overnight.  Midodrine  started overnight, now off Levophed .   Precedex  weaned off yesterday, remains encephalopathic but slowly improving. On 4L [place]. Creatinine worsened to 3.6, UOP 675 cc last 24 hrs (net - 8.4L), plan for HD today.  Plan for aggressive pulmonary toilet and mobilization as able.  11/26: Remains critically ill; desatted to the 80s overnight, placed on bipap and developed hypotension requiring initiation of levophed  gtt again. Hypercapnic overnight, will recheck vbg. Nephrology following, placed back on CRRT.  11/27: No acute events overnight, afebrile, remains on Levophed  and CRRT.  Worsening Leukocytosis, obtain Sputum culture and start Linezolid .  Weaned off BiPAP to [place]. 11/28 remains on CRRT remains on pressors, daughter updated over the phone  Interim History / Subjective:  Remains Critically ill Morbidly obese Remains on pressors CVL placed Remains on CRRT-wean off BiPAP High risk for aspiration cardiac arrest intubation    Objective   Blood pressure (!) 99/54, pulse 78, temperature 98.3 F (36.8 C), temperature source Oral, resp. rate (!) 22, height 5' 9.02 (1.753 m), weight 119.7 kg, SpO2 95%.    FiO2 (%):  [35 %] 35 %   Intake/Output Summary (Last 24 hours) at 03/24/2024 0723 Last data filed at 03/24/2024 0700 Gross per 24 hour  Intake 1763.64 ml  Output 4304 ml  Net -2540.36 ml   Filed Weights   03/22/24 0407 03/23/24 0438 03/24/24 0500  Weight: 126.1 kg 126.3 kg 119.7 kg   Physical examination BiPAP Lethargic this  a.m. 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 in the setting of morbid obesity with MRSA pneumonia with severe septic shock  Acute hypoxic respiratory failure due to MRSA pneumonia and pulmonary edema High risk for aspiration high risk for reintubation biPAP at night Oxygen  as needed Risk for aspiration   Shock combination sepsis and cardiogenic Continue pressors to maintain MAP goal Continue midodrine  Follow-up cardiology recommendations   Acute on chronic heart failure preserved EF cardiac failure Shock, history of paroxysmal A-fib -Echocardiogram 03/16/24: LVEF >75%, grade I DD, RV systolic function normal, RV size normal -Holding Heparin  gtt due to mild hemoptysis/hematuria   INFECTIOUS DISEASE-MRSA pneumonia -continue antibiotics as prescribed-restart antibiotics for MRSA with linezolid   ACUTE KIDNEY INJURY/Renal Failure I am concerned this has progressed to end-stage disease currently on CRRT -continue Foley Catheter-assess need -Avoid nephrotoxic agents -Follow urine output, BMP -Ensure adequate renal perfusion, optimize oxygenation -Renal dose medications   Intake/Output Summary (Last 24 hours) at 03/24/2024 0725 Last data filed at 03/24/2024 0700 Gross per 24 hour  Intake 1763.64 ml  Output 4304 ml  Net -2540.36 ml     ENDO - ICU hypoglycemic\Hyperglycemia protocol -check FSBS per protocol   GI GI PROPHYLAXIS as indicated NUTRITIONAL STATUS DIET-->TF's as tolerated Constipation protocol as indicated Aspiration precautions  ELECTROLYTES -follow labs as needed -replace as needed -pharmacy consultation and following  RESTRICTIVE TRANSFUSION PROTOCOL TRANSFUSION  IF HGB<7  or ACTIVE BLEEDING OR DX of ACUTE CORONARY SYNDROMES   PALLIATIVE  CARE TEAM CONSULTED  BEST Practice (right click and Reselect all SmartList Selections daily)   Diet/type: Regular consistency (see orders) DVT prophylaxis: SCD GI prophylaxis: PPI Lines: Central line, Dialysis Catheter, and yes and it is still needed Foley:  N/A Code Status:  full code Last date of multidisciplinary goals of care discussion [11/25]  11/25: Will update pt's family when they arrive at bedside on plan of care. 11/28-daughter Kim updated over the phone  Labs   CBC: Recent Labs  Lab 03/20/24 0500 03/21/24 0413 03/22/24 0326 03/23/24 0356 03/24/24 0441  WBC 17.2* 18.7* 10.2 8.3 9.0  HGB 9.9* 9.3* 7.6* 7.7* 7.6*  HCT 31.6* 29.6* 23.6* 24.5* 23.5*  MCV 103.6* 102.1* 100.4* 100.8* 100.0  PLT 193 185 147* 146* 160    Basic Metabolic Panel: Recent Labs  Lab 03/20/24 0500 03/21/24 0413 03/21/24 1655 03/22/24 0326 03/22/24 1513 03/23/24 0356 03/23/24 1522 03/24/24 0441  NA 135 137   < > 133* 136 134* 136 135  K 4.9 4.6   < > 3.9 4.3 4.7 3.8 4.3  CL 99 102   < > 101 103 102 102 101  CO2 26 24   < > 25 25 25 27 26   GLUCOSE 131* 136*   < > 154* 142* 156* 106* 99  BUN 41* 43*   < > 29* 22 22 20 17   CREATININE 3.47* 3.22*   < > 2.33* 1.98* 1.91* 1.81* 1.80*  CALCIUM  9.7 9.2   < > 8.5* 8.5* 8.6* 8.8* 8.5*  MG 2.3 2.2  --  2.3  --  2.3  --  2.3  PHOS 7.1* 5.4*   < > 2.4* 2.5 2.8 1.9* 3.0   < > = values in this interval not displayed.   GFR: Estimated Creatinine Clearance: 43.2 mL/min (A) (by C-G formula based on SCr of 1.8 mg/dL (H)). Recent Labs  Lab 03/18/24 1356 03/19/24 0359 03/21/24 0413 03/21/24 0907 03/22/24 0326 03/23/24 0356 03/24/24 0441  WBC  --    < > 18.7*  --  10.2 8.3 9.0  LATICACIDVEN 0.8  --   --  1.5  --   --   --    < > = values in this interval not displayed.    Liver Function Tests: Recent Labs  Lab 03/22/24 0326 03/22/24 1513 03/23/24 0356 03/23/24 1522 03/24/24 0441  ALBUMIN 2.9* 3.0* 3.0* 3.0* 3.1*   No results for  input(s): LIPASE, AMYLASE in the last 168 hours. No results for input(s): AMMONIA in the last 168 hours.  ABG    Component Value Date/Time   PHART 7.41 03/16/2024 1412   PCO2ART 41 03/16/2024 1412   PO2ART 167 (H) 03/16/2024 1412   HCO3 25.4  03/20/2024 1210   ACIDBASEDEF 3.9 (H) 03/20/2024 1210   O2SAT 74.7 03/20/2024 1210     Coagulation Profile: No results for input(s): INR, PROTIME in the last 168 hours.  Cardiac Enzymes: No results for input(s): CKTOTAL, CKMB, CKMBINDEX, TROPONINI in the last 168 hours.  HbA1C: Hgb A1c MFr Bld  Date/Time Value Ref Range Status  03/08/2024 03:11 AM 5.2 4.8 - 5.6 % Final    Comment:    (NOTE) Diagnosis of Diabetes The following HbA1c ranges recommended by the American Diabetes Association (ADA) may be used as an aid in the diagnosis of diabetes mellitus.  Hemoglobin             Suggested A1C NGSP%              Diagnosis  <5.7                   Non Diabetic  5.7-6.4                Pre-Diabetic  >6.4                   Diabetic  <7.0                   Glycemic control for                       adults with diabetes.    05/02/2020 02:37 PM 6.9 (H) 4.8 - 5.6 % Final    Comment:    (NOTE) Pre diabetes:          5.7%-6.4%  Diabetes:              >6.4%  Glycemic control for   <7.0% adults with diabetes     CBG: Recent Labs  Lab 03/23/24 1111 03/23/24 1600 03/23/24 2037 03/23/24 2345 03/24/24 0417  GLUCAP 130* 105* 115* 130* 105*     DVT/GI PRX  assessed I Assessed the need for Labs I Assessed the need for Foley I Assessed the need for Central Venous Line Family Discussion when available I Assessed the need for Mobilization I made an Assessment of medications to be adjusted accordingly Safety Risk assessment completed  CASE DISCUSSED IN MULTIDISCIPLINARY ROUNDS WITH ICU TEAM   Critical Care Time devoted to patient care services described in this note is 55 minutes.  Critical care was necessary  to treat /prevent imminent and life-threatening deterioration. Overall, patient is critically ill, prognosis is guarded.  Patient with Multiorgan failure and at high risk for cardiac arrest and death.    Nickolas Alm Cellar, M.D.  [facility]  Medical Director [facility]

## 2024-03-24 NOTE — Plan of Care (Addendum)
 Patient A&O to self, place, time and situation; denies pain. Patient is extremely hard of hearing and requires hearing aids (at bedside). Patient attempted to wear Bipap this shift but only kept in place for about 30 minutes; placed back on [place] @ 4 liters. RT notified and at bedside. Levophed  titrated to maintain MAP >65; tolerating CRRT with net goal -100. Filter changed this shift  due to increasing pressures with no complications. Patient has had 100 ml of amber urine out via external catheter.    Problem: Education: Goal: Knowledge of General Education information will improve Description: Including pain rating scale, medication(s)/side effects and non-pharmacologic comfort measures Outcome: Progressing   Problem: Clinical Measurements: Goal: Ability to maintain clinical measurements within normal limits will improve Outcome: Progressing Goal: Will remain free from infection Outcome: Progressing Goal: Diagnostic test results will improve Outcome: Progressing Goal: Respiratory complications will improve Outcome: Progressing Goal: Cardiovascular complication will be avoided Outcome: Progressing   Problem: Elimination: Goal: Will not experience complications related to bowel motility Outcome: Progressing   Problem: Pain Managment: Goal: General experience of comfort will improve and/or be controlled Outcome: Progressing   Problem: Safety: Goal: Ability to remain free from injury will improve Outcome: Progressing   Problem: Nutrition: Goal: Adequate nutrition will be maintained Outcome: Not Progressing

## 2024-03-25 LAB — GLUCOSE, CAPILLARY
Glucose-Capillary: 109 mg/dL — ABNORMAL HIGH (ref 70–99)
Glucose-Capillary: 118 mg/dL — ABNORMAL HIGH (ref 70–99)
Glucose-Capillary: 118 mg/dL — ABNORMAL HIGH (ref 70–99)
Glucose-Capillary: 121 mg/dL — ABNORMAL HIGH (ref 70–99)
Glucose-Capillary: 92 mg/dL (ref 70–99)
Glucose-Capillary: 96 mg/dL (ref 70–99)

## 2024-03-25 LAB — CBC
HCT: 24 % — ABNORMAL LOW (ref 39.0–52.0)
Hemoglobin: 7.6 g/dL — ABNORMAL LOW (ref 13.0–17.0)
MCH: 32.1 pg (ref 26.0–34.0)
MCHC: 31.7 g/dL (ref 30.0–36.0)
MCV: 101.3 fL — ABNORMAL HIGH (ref 80.0–100.0)
Platelets: 159 K/uL (ref 150–400)
RBC: 2.37 MIL/uL — ABNORMAL LOW (ref 4.22–5.81)
RDW: 14.6 % (ref 11.5–15.5)
WBC: 9.2 K/uL (ref 4.0–10.5)
nRBC: 0 % (ref 0.0–0.2)

## 2024-03-25 LAB — RENAL FUNCTION PANEL
Albumin: 3.2 g/dL — ABNORMAL LOW (ref 3.5–5.0)
Anion gap: 8 (ref 5–15)
BUN: 23 mg/dL (ref 8–23)
CO2: 27 mmol/L (ref 22–32)
Calcium: 9.1 mg/dL (ref 8.9–10.3)
Chloride: 103 mmol/L (ref 98–111)
Creatinine, Ser: 2.82 mg/dL — ABNORMAL HIGH (ref 0.61–1.24)
GFR, Estimated: 22 mL/min — ABNORMAL LOW (ref >60)
Glucose, Bld: 107 mg/dL — ABNORMAL HIGH (ref 70–99)
Phosphorus: 3.8 mg/dL (ref 2.5–4.6)
Potassium: 4.9 mmol/L (ref 3.5–5.1)
Sodium: 138 mmol/L (ref 135–145)

## 2024-03-25 LAB — MAGNESIUM: Magnesium: 2.3 mg/dL (ref 1.7–2.4)

## 2024-03-25 MED ORDER — HEPARIN SODIUM (PORCINE) 1000 UNIT/ML DIALYSIS
1000.0000 [IU] | INTRAMUSCULAR | Status: DC | PRN
  Administered 2024-03-25: 1000 [IU]

## 2024-03-25 MED ORDER — DIPHENHYDRAMINE HCL 50 MG/ML IJ SOLN
25.0000 mg | Freq: Once | INTRAMUSCULAR | Status: DC
  Filled 2024-03-25: qty 1

## 2024-03-25 MED ORDER — ALTEPLASE 2 MG IJ SOLR
2.0000 mg | Freq: Once | INTRAMUSCULAR | Status: AC | PRN
  Administered 2024-03-25: 2 mg
  Filled 2024-03-25 (×2): qty 2

## 2024-03-25 MED ORDER — HEPARIN SODIUM (PORCINE) 1000 UNIT/ML IJ SOLN
INTRAMUSCULAR | Status: AC
  Filled 2024-03-25: qty 5

## 2024-03-25 NOTE — Progress Notes (Signed)
  Bedside ICU 5  Informed consent signed and in chart.    TX duration:3.5hrs     Hand-off given to patient's nurse. Primary aware of pts b/p    Access used: R HD Femoral catheter Access issues: poor flow  Activase  instilled post tx.   Multiple alarms throughout tx.  Tx lasting almost 5hrs d/t machine stopping for inc venous pressure and inc arterial pressures   Pt vomiting intermittently throughout tx.  Zofran  given by ICU RN B/p soft and only able to be removed.  NP aware of above    Total UF removed: 0.5L Medication(s) given: activase   Post HD VS: wnl    Olivia Hurst LPN [facility]

## 2024-03-25 NOTE — Progress Notes (Signed)
 Palliative Care Progress Note, Assessment & Plan   Patient Name: Dave Brown       Date: 03/25/2024 DOB: [birth year]  Age: 78 y.o. MRN#: [id number] Attending Physician: Dave Scrivener, MD Primary Care Physician: Dave Josette ORN., PA-C Admit Date: 03/08/2024  Subjective: Denies pain. Feels better than yesterday. Denies SOB or CP. Eating meals. REady to go home.   HPI: 78 y.o. 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  Patient presented to [facility] ED 03/08/2024 c/o shortness of breath x 1 day with lower extremity edema and orthopnea.  Patient was found to have acute compensated HFpEF with AKI.   ED labs showed WBC 11, Hgb 12.3, platelets 138, sodium 141, potassium 4.1, bicarb 31, troponin 178, creatinine 4.15, BUN 46, GFR 14, BNP 16,139, lactic 1.9 => 0.8, COVID negative.   Chest x-ray showed patchy opacities right lower lobe and left lower lobe ED initial vital signs 88/50, HR 60, RR 28, SpO2 100% on 15 L NRB, 98.4 F   He was started on Levophed  and admitted to critical care.  Patient required transfer to [facility] for initiation of CRRT.   On 11/15 patient had seizure-like episode and was intubated. Patient was extubated to BiPAP 03/15/2024.  Nephrology and cardiology following as well.  Patient remains in ICU requiring CRRT and pressors for support.   Of note he had recent admission 02/01/2024 to 02/05/2024 for respiratory failure due to CHF.  He did not require oxygen  at the time of his discharge.   Palliative medicine was consulted to assist with goals of care conversations.    Summary of counseling/coordination of care: Extensive chart review completed prior to meeting patient including labs, vital signs,  imaging, progress notes, orders, and available advanced directive documents from current and previous encounters.   After reviewing the patient's chart and assessing the patient at bedside, I spoke with patient in regards to symptom management and goals of care.      Latest Ref Rng & Units 03/25/2024    4:51 AM 03/24/2024    4:41 AM 03/23/2024    3:56 AM  CBC  WBC 4.0 - 10.5 K/uL 9.2  9.0  8.3   Hemoglobin 13.0 - 17.0 g/dL 7.6  7.6  7.7   Hematocrit 39.0 - 52.0 % 24.0  23.5  24.5   Platelets 150 - 400 K/uL 159  160  146       Latest Ref Rng & Units 03/25/2024    4:51 AM 03/24/2024    3:32 PM 03/24/2024    4:41 AM  CMP  Glucose 70 - 99 mg/dL 892  896  99   BUN 8 - 23 mg/dL 23  18  17    Creatinine 0.61 - 1.24 mg/dL 7.17  7.86  8.19   Sodium 135 - 145 mmol/L 138  135  135   Potassium 3.5 - 5.1 mmol/L 4.9  4.6  4.3   Chloride 98 - 111 mmol/L 103  101  101   CO2 22 - 32 mmol/L 27  26  26    Calcium  8.9 - 10.3 mg/dL 9.1  8.8  8.5  Elderly, ill-appearing, obese male sitting upright in recliner eating lunch. He is alert and oriented. He is able to participate in conversation making his wishes known. Respirations are even and unlabored. He is in no distress.   Discussed that he is continuing with dialysis once he is discharged home. [name] confirms and shares that he is ready to return home.   Therapeutic silence and active listening provided for patient to share his thoughts and emotions regarding current medical situation.  Emotional support provided.  Physical Exam Vitals reviewed.  Constitutional:      General: He is not in acute distress.    Appearance: He is obese. He is ill-appearing.  HENT:     Head: Normocephalic and atraumatic.     Mouth/Throat:     Mouth: Mucous membranes are moist.  Pulmonary:     Effort: Pulmonary effort is normal. No respiratory distress.  Musculoskeletal:     Right lower leg: No edema.     Left lower leg: No edema.  Skin:    General:  Skin is warm and dry.  Neurological:     Mental Status: He is alert and oriented to person, place, and time.  Psychiatric:        Mood and Affect: Mood normal.        Behavior: Behavior normal.        Thought Content: Thought content normal.        Judgment: Judgment normal.    Recommendations/Plan: FULL CODE status as previously documented    Continue current supportive interventions Disposition TBD   PMT to follow peripherally and remain available for needs         Total Time 50 minutes   Time spent includes: Detailed review of medical records (labs, imaging, vital signs), medically appropriate exam (mental status, respiratory, cardiac, skin), discussed with treatment team, counseling and educating patient, family and staff, documenting clinical information, medication management and coordination of care.     Devere Sacks, AMANDA [facility] Palliative Medicine Team  03/25/2024 8:52 AM  Office [phone]  Pager [phone]

## 2024-03-25 NOTE — H&P (View-Only) (Signed)
 Hospital Consult    Reason for Consult:  Placement of Dialysis Perma Catheter Requesting Physician:  Faith Harris NP  MRN #:  [id number]  History of Present Illness: This is a 78 y.o. male admitted with Acute on Chronic HFpEF, Cardiogenic/Septic shock, AKI, Acute Hypoxic Respiratory Failure, and MRSA Pneumonia requiring mechanical ventilation and initiation of CRRT. 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  On 03/08/2024 patient was transferred to [facility] for initiation of CRRT.  03/09/2024 it appeared that he had a seizure and was intubated.  On 03/15/2024 he was extubated to BiPAP and started on dobutamine  for blood pressure support.  On 03/19/2024 midodrine  was started overnight as he was taken off Levophed .  He was on Precedex  that was weaned as well but he remained encephalopathic like but slowly improving.  On 03/20/2024 the patient desaturated again overnight and was placed back on CRRT.  03/25/2024 patient remained confused and refused to use BiPAP while.  He was placed on 2 L nasal cannula oxygen  which helped with his confusion.  He is no longer requiring vasopressors.  Vascular surgery was consulted for placement of dialysis permacatheter for long-term outpatient hemodialysis.  Past Medical History:  Diagnosis Date   CHF (congestive heart failure) (HCC)    Degenerative joint disease    High cholesterol    Hypertension    Kidney stones    Obesity    Respiratory failure with hypoxia (HCC)    Sleep apnea     History reviewed. No pertinent surgical history.  No Known Allergies  Prior to Admission medications   Medication Sig Start Date End Date Taking? Authorizing Provider  albuterol  (PROVENTIL ) (2.5 MG/3ML) 0.083% nebulizer solution Take 2.5  mg by nebulization every 6 (six) hours as needed for shortness of breath or wheezing. 03/04/24  Yes [provider]  hydrocortisone  sodium succinate  (SOLU-CORTEF ) 100 MG injection Inject 2 mLs (100 mg total) into the vein every 8 (eight) hours. 03/08/24  Yes Tat, Alm, MD  midodrine  (PROAMATINE ) 10 MG tablet Take 1 tablet (10 mg total) by mouth 3 (three) times daily with meals. 03/08/24  Yes Tat, Alm, MD  norepinephrine  (LEVOPHED ) 4-5 MG/250ML-% SOLN Inject 0-10 mcg/min into the vein continuous. 03/08/24  Yes Tat, Alm, MD  rosuvastatin  (CRESTOR ) 10 MG tablet Take 10 mg by mouth daily.   Yes [provider]  sertraline  (ZOLOFT ) 25 MG tablet Take 25 mg by mouth in the morning.   Yes [provider]  TYLENOL  325 MG tablet Take 325-650 mg by mouth every 8 (eight) hours as needed for mild pain (pain score 1-3) (or headaches).   Yes [provider]    Social History   Socioeconomic History   Marital status: Widowed    Spouse name: Not on file   Number of children: 4   Years of education: Not on file   Highest education level: Not on file  Occupational History   Not on file  Tobacco Use   Smoking status: Former    Types: Cigars    Quit date: 05/07/1998    Years since quitting: 25.9   Smokeless tobacco: Not on file  Vaping Use   Vaping status: Never Used  Substance and Sexual Activity   Alcohol use: No   Drug use: No   Sexual  activity: Not on file    Comment: pt. stated,  I smoked 5 cigars a day.  Other Topics Concern   Not on file  Social History Narrative   Not on file   Social Drivers of Health   Financial Resource Strain: Not on file  Food Insecurity: No Food Insecurity (03/07/2024)   Hunger Vital Sign    Worried About Running Out of Food in the Last Year: Never true    Ran Out of Food in the Last Year: Never true  Transportation Needs: No Transportation Needs (03/07/2024)   PRAPARE - Administrator, Civil Service  (Medical): No    Lack of Transportation (Non-Medical): No  Physical Activity: Not on file  Stress: Not on file  Social Connections: Unknown (03/07/2024)   Social Connection and Isolation Panel    Frequency of Communication with Friends and Family: Three times a week    Frequency of Social Gatherings with Friends and Family: Three times a week    Attends Religious Services: Patient declined    Active Member of Clubs or Organizations: Patient declined    Attends Banker Meetings: Patient declined    Marital Status: Widowed  Intimate Partner Violence: Not At Risk (03/07/2024)   Humiliation, Afraid, Rape, and Kick questionnaire    Fear of Current or Ex-Partner: No    Emotionally Abused: No    Physically Abused: No    Sexually Abused: No     History reviewed. No pertinent family history.  ROS: Otherwise negative unless mentioned in HPI  Physical Examination  Vitals:   03/25/24 1100 03/25/24 1157  BP: (!) 103/56   Pulse: 86 85  Resp: (!) 28   Temp:    SpO2: 96% 92%   Body mass index is 39.24 kg/m.  General:  WDWN in NAD Gait: Not observed HENT: WNL, normocephalic Pulmonary: normal non-labored breathing, without Rales, rhonchi,  wheezing Cardiac: regular, without  Murmurs, rubs or gallops; without carotid bruits Abdomen: Positive bowel sounds throughout, soft, NT/ND, no masses Skin: without rashes Vascular Exam/Pulses: All extremities are warm to touch with palpable pulses. Extremities: without ischemic changes, without Gangrene , without cellulitis; without open wounds;  Musculoskeletal: no muscle wasting or atrophy  Neurologic: A&O X 3;  No focal weakness or paresthesias are detected; speech is fluent/normal Psychiatric:  The pt has Normal affect. Lymph:  Unremarkable  CBC    Component Value Date/Time   WBC 9.2 03/25/2024 0451   RBC 2.37 (L) 03/25/2024 0451   HGB 7.6 (L) 03/25/2024 0451   HGB 14.7 04/13/2022 1334   HGB 14.9 07/18/2013 0935   HCT  24.0 (L) 03/25/2024 0451   HCT 44.5 07/18/2013 0935   PLT 159 03/25/2024 0451   PLT 147 (L) 04/13/2022 1334   PLT 145 07/18/2013 0935   MCV 101.3 (H) 03/25/2024 0451   MCV 91.7 07/18/2013 0935   MCH 32.1 03/25/2024 0451   MCHC 31.7 03/25/2024 0451   RDW 14.6 03/25/2024 0451   RDW 15.5 (H) 07/18/2013 0935   LYMPHSABS 0.6 (L) 03/12/2024 0412   LYMPHSABS 2.9 07/18/2013 0935   MONOABS 0.9 03/12/2024 0412   MONOABS 0.8 07/18/2013 0935   EOSABS 0.0 03/12/2024 0412   EOSABS 0.3 07/18/2013 0935   BASOSABS 0.0 03/12/2024 0412   BASOSABS 0.0 07/18/2013 0935    BMET    Component Value Date/Time   NA 138 03/25/2024 0451   NA 138 07/18/2013 0935   K 4.9 03/25/2024 0451   K 3.4 (L)  07/18/2013 0935   CL 103 03/25/2024 0451   CL 104 06/22/2012 0901   CO2 27 03/25/2024 0451   CO2 20 (L) 07/18/2013 0935   GLUCOSE 107 (H) 03/25/2024 0451   GLUCOSE 169 (H) 07/18/2013 0935   GLUCOSE 107 (H) 06/22/2012 0901   BUN 23 03/25/2024 0451   BUN 35.9 (H) 07/18/2013 0935   CREATININE 2.82 (H) 03/25/2024 0451   CREATININE 2.86 (H) 04/13/2022 1334   CREATININE 2.0 (H) 07/18/2013 0935   CALCIUM  9.1 03/25/2024 0451   CALCIUM  10.1 07/18/2013 0935   GFRNONAA 22 (L) 03/25/2024 0451   GFRNONAA 22 (L) 04/13/2022 1334    COAGS: Lab Results  Component Value Date   INR 1.0 03/08/2024   INR 0.98 09/05/2011     Non-Invasive Vascular Imaging:   EXAM: 1 AP VIEW(S) XRAY OF THE CHEST 03/19/2024 07:25:00 AM   COMPARISON: Portable chest 03/19/2024.   CLINICAL HISTORY: [id number] PICC (peripherally inserted central catheter) in place [id number] PICC (peripherally inserted central catheter) in place   FINDINGS:   LINES, TUBES AND DEVICES: Right PICC has been pulled back into the distal SVC.   LUNGS AND PLEURA: Small layering pleural effusions continue to be noted with overlying basilar atelectasis or consolidation.   There is Perihilar vascular congestion, central and basilar interstitial edema.    Overall aeration is unchanged with no other focal abnormality. No pneumothorax.   HEART AND MEDIASTINUM: Stable cardiomegaly. Mediastinum is stable with widening probably due to lipomatosis. There is calcification in the transverse aorta.   BONES AND SOFT TISSUES: Thoracic spondylosis.   IMPRESSION: 1. Right PICC terminates in the distal SVC. 2. Small layering pleural effusions with overlying basilar atelectasis or consolidation. 3. Stable cardiomegaly with perihilar vascular congestion and central and basilar interstitial edema.  Statin:  Yes.   Beta Blocker:  No. Aspirin:  No. ACEI:  No. ARB:  No. CCB use:  No Other antiplatelets/anticoagulants:  No.    ASSESSMENT/PLAN: This is a 78 y.o. male who presented to [facility] medical treatment center for acute decompensated heart failure with acute kidney injury.  He continued to decline and required blood pressure support with Levophed .  He was transferred to [facility] for initiation of CRRT.  Since that time he has been intubated and extubated.  He continues to be long-term hemodialysis.  Vascular surgery was consulted for placement of dialysis permacatheter for wound term outpatient hemodialysis.  I had a long detailed discussion at the bedside yesterday with the patient who was of sound mind and able to answer my questions.  We discussed in detail the procedure, benefits, risk, and complications.  Patient understood what a dialysis permacatheter was and that he would require long-term hemodialysis.  He wishes to proceed.  I answered all his questions today.  Patient will be made n.p.o. after midnight on the day of his procedure.  Patient's current BUN and creatinine are 24 and 3.23 with a GFR estimated at 19.  Patient's potassium today was 5.1.  Patient's white blood cell count was elevated today on 03/26/2024 up to 18.6 from 9.2.  Patient's hemoglobin is 7.1 and hematocrit is 22.0 today.  Plan is to take the patient to the vascular lab for  placement of dialysis permacatheter access but with elevated white blood cell count this morning we will continue to evaluate prior to placing an indwelling catheter at this time.  I will let nephrology know of our plan.   -I discussed the case in detail with Dr. Cordella Shawl MD  and he agrees with the plan.   [name] [facility] 03/25/2024 12:13 PM

## 2024-03-25 NOTE — Progress Notes (Signed)
   03/25/24 1000  Spiritual Encounters  Type of Visit Initial  Care provided to: Patient  Reason for visit Advance directives  OnCall Visit Yes  Interventions  Spiritual Care Interventions Made Established relationship of care and support;Compassionate presence;Reflective listening  Intervention Outcomes  Outcomes Connection to spiritual care   Chaplain explain the purpose of an Advance Directive. Patient said they wanted their son to be the person for [place]. The son may or may not be coming by today. Patient said they will wait to have family help fill out information. Patient mentioned they believe in a higher power, but was not sure how far to push it. Patient says they want to be ready when death comes.

## 2024-03-25 NOTE — Progress Notes (Signed)
 Patient woke up this morning confused pulling BP cuff off and then insisted that Bipap be removed, when oxygen  was placed back on patient, he refused to leave in place. Pt is alert to self, place, year but continues to refuse oxygen  and BP after reminding him that he wears oxygen  at home and currently is on medication that requires frequent BPs. Patient stating that this is all a HOAX  Patient also states that today I am going to marry my fiance. We are having a contest and whoever wins gets a bunch of money! Charge RN in at bedside to place BP cuff and oxygen  back on pt. BP obtained and O2 reading obtained. Will spot check until patient will allow BP cuff to be put back on.

## 2024-03-25 NOTE — Progress Notes (Signed)
 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 03/08/24 transferred to [facility] for initiation of CRRT. 03/09/24 seizure like episode, intubated. 03/15/24 extubated.    PT Comments  Patient is agreeable to PT session. He required assistance for bed mobility. Multiple standing bouts performed with increased independence with standing using rolling walker. Pre-gait activity performed with standing using rolling walker. Activity tolerance limited by fatigue with Sp02 remaining in the 90's with activity. Recommend to continue PT to maximize independence. Rehabilitation < 3 hours/day recommended after this hospital stay.    If plan is discharge home, recommend the following: A lot of help with walking and/or transfers;A lot of help with bathing/dressing/bathroom;Assist for transportation;Supervision due to cognitive status;Help with stairs or ramp for entrance;Assistance with cooking/housework   Can travel by private vehicle     No  Equipment Recommendations  None recommended by PT    Recommendations for Other Services       Precautions / Restrictions Precautions Precautions: Fall Recall of Precautions/Restrictions: Impaired Restrictions Weight Bearing Restrictions Per Provider Order: No     Mobility  Bed Mobility Overal bed mobility: Needs Assistance Bed Mobility: Supine to Sit, Sit to Supine     Supine to sit: Min assist     General bed mobility comments: cues for initiation and sequencing    Transfers Overall transfer level: Needs assistance Equipment used: Rolling  walker (2 wheels), 2 person hand held assist Transfers: Sit to/from Stand, Bed to chair/wheelchair/BSC Sit to Stand: Min assist, Mod assist   Step pivot transfers: Min assist       General transfer comment: 4 standing bouts performed, with 2 person HHA and using rolling walker. increased independence using rolling walker compared to HHA. cues for hand placement for safety. patient able to complete step transfer to chair with Min A of one person using rolling walker    Ambulation/Gait             Pre-gait activities: standing marching, side steps, and weight shifting using rolling walker with Min A +2 initially progressing to one person assistance     Stairs             Wheelchair Mobility     Tilt Bed    Modified Rankin (Stroke Patients Only)       Balance Overall balance assessment: Needs assistance Sitting-balance support: No upper extremity supported, Feet supported Sitting balance-Leahy Scale: Fair     Standing balance support: Bilateral upper extremity supported Standing balance-Leahy Scale: Poor Standing balance comment: external support required                            Communication Communication Communication: Impaired Factors Affecting Communication: Hearing impaired  Cognition Arousal: Alert Behavior During Therapy: WFL for tasks assessed/performed   PT - Cognitive impairments: Memory, Sequencing, Initiation Difficult to assess due to: Hard of hearing/deaf                     PT - Cognition Comments: patient is oriented to place, month, date. he is confused about the situation  and asking why he is at [facility] rather than [facility]. Following commands: Impaired Following commands impaired: Follows one step commands with increased time    Cueing Cueing Techniques: Verbal cues, Tactile cues  Exercises      General Comments General comments (skin integrity, edema, etc.): Sp02 in the 90's with increased  respiration rate with activity. frequent seated rest breaks required with mobility      Pertinent Vitals/Pain Pain Assessment Pain Assessment: No/denies pain    Home Living                          Prior Function            PT Goals (current goals can now be found in the care plan section) Acute Rehab PT Goals Patient Stated Goal: to get better PT Goal Formulation: With patient Time For Goal Achievement: 04/01/24 Potential to Achieve Goals: Good Progress towards PT goals: Progressing toward goals    Frequency    Min 2X/week      PT Plan      Co-evaluation PT/OT/SLP Co-Evaluation/Treatment: Yes Reason for Co-Treatment:  (limited endurance, anticipated +2 for mobility) PT goals addressed during session: Mobility/safety with mobility OT goals addressed during session: ADL's and self-care      AM-PAC PT 6 Clicks Mobility   Outcome Measure  Help needed turning from your back to your side while in a flat bed without using bedrails?: A Lot Help needed moving from lying on your back to sitting on the side of a flat bed without using bedrails?: A Lot Help needed moving to and from a bed to a chair (including a wheelchair)?: A Lot Help needed standing up from a chair using your arms (e.g., wheelchair or bedside chair)?: A Lot Help needed to walk in hospital room?: Total Help needed climbing 3-5 steps with a railing? : Total 6 Click Score: 10    End of Session Equipment Utilized During Treatment: Oxygen  Activity Tolerance: Patient tolerated treatment well Patient left: in chair;with call bell/phone within reach Nurse Communication: Mobility status PT Visit Diagnosis: Muscle weakness (generalized) (M62.81);Unsteadiness on feet (R26.81)     Time: 8996-8972 PT Time Calculation (min) (ACUTE ONLY): 24 min  Charges:    $Therapeutic Activity: 8-22 mins PT General Charges $$ ACUTE PT VISIT: 1 Visit                     [name], PT, MPT    [name] 03/25/2024, 11:16 AM

## 2024-03-25 NOTE — Consult Note (Signed)
 Hospital Consult    Reason for Consult:  Placement of Dialysis Perma Catheter Requesting Physician:  Faith Harris NP  MRN #:  [id number]  History of Present Illness: This is a 78 y.o. male admitted with Acute on Chronic HFpEF, Cardiogenic/Septic shock, AKI, Acute Hypoxic Respiratory Failure, and MRSA Pneumonia requiring mechanical ventilation and initiation of CRRT. 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  On 03/08/2024 patient was transferred to [facility] for initiation of CRRT.  03/09/2024 it appeared that he had a seizure and was intubated.  On 03/15/2024 he was extubated to BiPAP and started on dobutamine  for blood pressure support.  On 03/19/2024 midodrine  was started overnight as he was taken off Levophed .  He was on Precedex  that was weaned as well but he remained encephalopathic like but slowly improving.  On 03/20/2024 the patient desaturated again overnight and was placed back on CRRT.  03/25/2024 patient remained confused and refused to use BiPAP while.  He was placed on 2 L nasal cannula oxygen  which helped with his confusion.  He is no longer requiring vasopressors.  Vascular surgery was consulted for placement of dialysis permacatheter for long-term outpatient hemodialysis.  Past Medical History:  Diagnosis Date   CHF (congestive heart failure) (HCC)    Degenerative joint disease    High cholesterol    Hypertension    Kidney stones    Obesity    Respiratory failure with hypoxia (HCC)    Sleep apnea     History reviewed. No pertinent surgical history.  No Known Allergies  Prior to Admission medications   Medication Sig Start Date End Date Taking? Authorizing Provider  albuterol  (PROVENTIL ) (2.5 MG/3ML) 0.083% nebulizer solution Take 2.5  mg by nebulization every 6 (six) hours as needed for shortness of breath or wheezing. 03/04/24  Yes [provider]  hydrocortisone  sodium succinate  (SOLU-CORTEF ) 100 MG injection Inject 2 mLs (100 mg total) into the vein every 8 (eight) hours. 03/08/24  Yes Tat, Alm, MD  midodrine  (PROAMATINE ) 10 MG tablet Take 1 tablet (10 mg total) by mouth 3 (three) times daily with meals. 03/08/24  Yes Tat, Alm, MD  norepinephrine  (LEVOPHED ) 4-5 MG/250ML-% SOLN Inject 0-10 mcg/min into the vein continuous. 03/08/24  Yes Tat, Alm, MD  rosuvastatin  (CRESTOR ) 10 MG tablet Take 10 mg by mouth daily.   Yes [provider]  sertraline  (ZOLOFT ) 25 MG tablet Take 25 mg by mouth in the morning.   Yes [provider]  TYLENOL  325 MG tablet Take 325-650 mg by mouth every 8 (eight) hours as needed for mild pain (pain score 1-3) (or headaches).   Yes [provider]    Social History   Socioeconomic History   Marital status: Widowed    Spouse name: Not on file   Number of children: 4   Years of education: Not on file   Highest education level: Not on file  Occupational History   Not on file  Tobacco Use   Smoking status: Former    Types: Cigars    Quit date: 05/07/1998    Years since quitting: 25.9   Smokeless tobacco: Not on file  Vaping Use   Vaping status: Never Used  Substance and Sexual Activity   Alcohol use: No   Drug use: No   Sexual  activity: Not on file    Comment: pt. stated,  I smoked 5 cigars a day.  Other Topics Concern   Not on file  Social History Narrative   Not on file   Social Drivers of Health   Financial Resource Strain: Not on file  Food Insecurity: No Food Insecurity (03/07/2024)   Hunger Vital Sign    Worried About Running Out of Food in the Last Year: Never true    Ran Out of Food in the Last Year: Never true  Transportation Needs: No Transportation Needs (03/07/2024)   PRAPARE - Administrator, Civil Service  (Medical): No    Lack of Transportation (Non-Medical): No  Physical Activity: Not on file  Stress: Not on file  Social Connections: Unknown (03/07/2024)   Social Connection and Isolation Panel    Frequency of Communication with Friends and Family: Three times a week    Frequency of Social Gatherings with Friends and Family: Three times a week    Attends Religious Services: Patient declined    Active Member of Clubs or Organizations: Patient declined    Attends Banker Meetings: Patient declined    Marital Status: Widowed  Intimate Partner Violence: Not At Risk (03/07/2024)   Humiliation, Afraid, Rape, and Kick questionnaire    Fear of Current or Ex-Partner: No    Emotionally Abused: No    Physically Abused: No    Sexually Abused: No     History reviewed. No pertinent family history.  ROS: Otherwise negative unless mentioned in HPI  Physical Examination  Vitals:   03/25/24 1100 03/25/24 1157  BP: (!) 103/56   Pulse: 86 85  Resp: (!) 28   Temp:    SpO2: 96% 92%   Body mass index is 39.24 kg/m.  General:  WDWN in NAD Gait: Not observed HENT: WNL, normocephalic Pulmonary: normal non-labored breathing, without Rales, rhonchi,  wheezing Cardiac: regular, without  Murmurs, rubs or gallops; without carotid bruits Abdomen: Positive bowel sounds throughout, soft, NT/ND, no masses Skin: without rashes Vascular Exam/Pulses: All extremities are warm to touch with palpable pulses. Extremities: without ischemic changes, without Gangrene , without cellulitis; without open wounds;  Musculoskeletal: no muscle wasting or atrophy  Neurologic: A&O X 3;  No focal weakness or paresthesias are detected; speech is fluent/normal Psychiatric:  The pt has Normal affect. Lymph:  Unremarkable  CBC    Component Value Date/Time   WBC 9.2 03/25/2024 0451   RBC 2.37 (L) 03/25/2024 0451   HGB 7.6 (L) 03/25/2024 0451   HGB 14.7 04/13/2022 1334   HGB 14.9 07/18/2013 0935   HCT  24.0 (L) 03/25/2024 0451   HCT 44.5 07/18/2013 0935   PLT 159 03/25/2024 0451   PLT 147 (L) 04/13/2022 1334   PLT 145 07/18/2013 0935   MCV 101.3 (H) 03/25/2024 0451   MCV 91.7 07/18/2013 0935   MCH 32.1 03/25/2024 0451   MCHC 31.7 03/25/2024 0451   RDW 14.6 03/25/2024 0451   RDW 15.5 (H) 07/18/2013 0935   LYMPHSABS 0.6 (L) 03/12/2024 0412   LYMPHSABS 2.9 07/18/2013 0935   MONOABS 0.9 03/12/2024 0412   MONOABS 0.8 07/18/2013 0935   EOSABS 0.0 03/12/2024 0412   EOSABS 0.3 07/18/2013 0935   BASOSABS 0.0 03/12/2024 0412   BASOSABS 0.0 07/18/2013 0935    BMET    Component Value Date/Time   NA 138 03/25/2024 0451   NA 138 07/18/2013 0935   K 4.9 03/25/2024 0451   K 3.4 (L)  07/18/2013 0935   CL 103 03/25/2024 0451   CL 104 06/22/2012 0901   CO2 27 03/25/2024 0451   CO2 20 (L) 07/18/2013 0935   GLUCOSE 107 (H) 03/25/2024 0451   GLUCOSE 169 (H) 07/18/2013 0935   GLUCOSE 107 (H) 06/22/2012 0901   BUN 23 03/25/2024 0451   BUN 35.9 (H) 07/18/2013 0935   CREATININE 2.82 (H) 03/25/2024 0451   CREATININE 2.86 (H) 04/13/2022 1334   CREATININE 2.0 (H) 07/18/2013 0935   CALCIUM  9.1 03/25/2024 0451   CALCIUM  10.1 07/18/2013 0935   GFRNONAA 22 (L) 03/25/2024 0451   GFRNONAA 22 (L) 04/13/2022 1334    COAGS: Lab Results  Component Value Date   INR 1.0 03/08/2024   INR 0.98 09/05/2011     Non-Invasive Vascular Imaging:   EXAM: 1 AP VIEW(S) XRAY OF THE CHEST 03/19/2024 07:25:00 AM   COMPARISON: Portable chest 03/19/2024.   CLINICAL HISTORY: [id number] PICC (peripherally inserted central catheter) in place [id number] PICC (peripherally inserted central catheter) in place   FINDINGS:   LINES, TUBES AND DEVICES: Right PICC has been pulled back into the distal SVC.   LUNGS AND PLEURA: Small layering pleural effusions continue to be noted with overlying basilar atelectasis or consolidation.   There is Perihilar vascular congestion, central and basilar interstitial edema.    Overall aeration is unchanged with no other focal abnormality. No pneumothorax.   HEART AND MEDIASTINUM: Stable cardiomegaly. Mediastinum is stable with widening probably due to lipomatosis. There is calcification in the transverse aorta.   BONES AND SOFT TISSUES: Thoracic spondylosis.   IMPRESSION: 1. Right PICC terminates in the distal SVC. 2. Small layering pleural effusions with overlying basilar atelectasis or consolidation. 3. Stable cardiomegaly with perihilar vascular congestion and central and basilar interstitial edema.  Statin:  Yes.   Beta Blocker:  No. Aspirin:  No. ACEI:  No. ARB:  No. CCB use:  No Other antiplatelets/anticoagulants:  No.    ASSESSMENT/PLAN: This is a 78 y.o. male who presented to [facility] medical treatment center for acute decompensated heart failure with acute kidney injury.  He continued to decline and required blood pressure support with Levophed .  He was transferred to [facility] for initiation of CRRT.  Since that time he has been intubated and extubated.  He continues to be long-term hemodialysis.  Vascular surgery was consulted for placement of dialysis permacatheter for wound term outpatient hemodialysis.  I had a long detailed discussion at the bedside yesterday with the patient who was of sound mind and able to answer my questions.  We discussed in detail the procedure, benefits, risk, and complications.  Patient understood what a dialysis permacatheter was and that he would require long-term hemodialysis.  He wishes to proceed.  I answered all his questions today.  Patient will be made n.p.o. after midnight on the day of his procedure.  Patient's current BUN and creatinine are 24 and 3.23 with a GFR estimated at 19.  Patient's potassium today was 5.1.  Patient's white blood cell count was elevated today on 03/26/2024 up to 18.6 from 9.2.  Patient's hemoglobin is 7.1 and hematocrit is 22.0 today.  Plan is to take the patient to the vascular lab for  placement of dialysis permacatheter access but with elevated white blood cell count this morning we will continue to evaluate prior to placing an indwelling catheter at this time.  I will let nephrology know of our plan.   -I discussed the case in detail with Dr. Cordella Shawl MD  and he agrees with the plan.   [name] [facility] 03/25/2024 12:13 PM

## 2024-03-25 NOTE — Plan of Care (Signed)
  Problem: Education: Goal: Knowledge of General Education information will improve Description: Including pain rating scale, medication(s)/side effects and non-pharmacologic comfort measures Outcome: Progressing   Problem: Clinical Measurements: Goal: Ability to maintain clinical measurements within normal limits will improve Outcome: Progressing Goal: Will remain free from infection Outcome: Progressing Goal: Diagnostic test results will improve Outcome: Progressing Goal: Respiratory complications will improve Outcome: Progressing Goal: Cardiovascular complication will be avoided Outcome: Progressing   Problem: Elimination: Goal: Will not experience complications related to bowel motility Outcome: Progressing   Problem: Pain Managment: Goal: General experience of comfort will improve and/or be controlled Outcome: Progressing   Problem: Safety: Goal: Ability to remain free from injury will improve Outcome: Progressing   Problem: Elimination: Goal: Will not experience complications related to urinary retention Outcome: Not Progressing

## 2024-03-25 NOTE — Progress Notes (Addendum)
 Found patients dentures in the floor. Patient states that he was trying to get them off of the table and they fell. No damage noted to top or bottom dentures. Top and bottom dentures cleaned, rinse and given back to patient. Patient placed dentures back in mouth.

## 2024-03-25 NOTE — Progress Notes (Signed)
 [facility] [place], [place] 03/25/24  Subjective:   Hospital day # 36 78 year old male with hypertension, heart failure with preserved ejection fraction, hyperlipidemia, stage IV CKD, obstructive sleep apnea, MGUS, chronic respiratory failure on 2 L oxygen  presented from [facility] with shortness of breath.  Transferred to [facility] for need of continuous dialysis.  Update:  Remains in ICU Alert and oriented, awaiting breakfast Weaned to 2L [place] Levo weaned this morning   Urine output  Objective:  Vital signs in last 24 hours:  Temp:  [98 F (36.7 C)-98.6 F (37 C)] 98.4 F (36.9 C) (12/01 0731) Pulse Rate:  [64-92] 82 (12/01 0731) Resp:  [17-30] 30 (12/01 0900) BP: (84-116)/(45-90) 110/66 (12/01 0900) SpO2:  [91 %-100 %] 96 % (12/01 0731) FiO2 (%):  [35 %] 35 % (12/01 0400) Weight:  [120.6 kg] 120.6 kg (12/01 0500)  Weight change: 0.9 kg Filed Weights   03/23/24 0438 03/24/24 0500 03/25/24 0500  Weight: 126.3 kg 119.7 kg 120.6 kg    Intake/Output:    Intake/Output Summary (Last 24 hours) at 03/25/2024 0959 Last data filed at 03/25/2024 0900 Gross per 24 hour  Intake 886.14 ml  Output 450 ml  Net 436.14 ml     Physical Exam: General: Ill-appearing elderly  Pulm/lungs Scattered rhonchi, [place] O2  CVS/Heart S1S2 no rubs  Abdomen:  Distended, nontender  Extremities: 2+ bilateral lower extremity edema  Neurologic: Alert, hard of hearing  Skin: Warm, dry  Access: Left femoral temporary dialysis catheter       Basic Metabolic Panel:  Recent Labs  Lab 03/21/24 0413 03/21/24 1655 03/22/24 0326 03/22/24 1513 03/23/24 0356 03/23/24 1522 03/24/24 0441 03/24/24 1532 03/25/24 0451  NA 137   < > 133*   < > 134* 136 135 135 138  K 4.6   < > 3.9   < > 4.7 3.8 4.3 4.6 4.9  CL 102   < > 101   < > 102 102 101 101 103  CO2 24   < > 25   < > 25 27 26 26 27   GLUCOSE 136*   < > 154*   < > 156* 106* 99 103* 107*  BUN 43*   < > 29*   < > 22  20 17 18 23   CREATININE 3.22*   < > 2.33*   < > 1.91* 1.81* 1.80* 2.13* 2.82*  CALCIUM  9.2   < > 8.5*   < > 8.6* 8.8* 8.5* 8.8* 9.1  MG 2.2  --  2.3  --  2.3  --  2.3  --  2.3  PHOS 5.4*   < > 2.4*   < > 2.8 1.9* 3.0 3.6 3.8   < > = values in this interval not displayed.     CBC: Recent Labs  Lab 03/21/24 0413 03/22/24 0326 03/23/24 0356 03/24/24 0441 03/25/24 0451  WBC 18.7* 10.2 8.3 9.0 9.2  HGB 9.3* 7.6* 7.7* 7.6* 7.6*  HCT 29.6* 23.6* 24.5* 23.5* 24.0*  MCV 102.1* 100.4* 100.8* 100.0 101.3*  PLT 185 147* 146* 160 159      Lab Results  Component Value Date   HEPBSAG NON REACTIVE 03/16/2024      Microbiology:  Recent Results (from the past 240 hours)  Expectorated Sputum Assessment w Gram Stain, Rflx to Resp Cult     Status: None   Collection Time: 03/21/24  5:20 PM   Specimen: Throat; Sputum  Result Value Ref Range Status   Specimen  Description THROAT  Final   Special Requests NONE  Final   Sputum evaluation   Final    Sputum specimen not acceptable for testing.  Please recollect.   RECOLLECT [address] 03/21/2024 [facility] Performed at [facility], [address] [place], [place] [address]    Report Status 03/21/2024 FINAL  Final    Coagulation Studies: No results for input(s): LABPROT, INR in the last 72 hours.   Urinalysis: No results for input(s): COLORURINE, LABSPEC, PHURINE, GLUCOSEU, HGBUR, BILIRUBINUR, KETONESUR, PROTEINUR, UROBILINOGEN, NITRITE, LEUKOCYTESUR in the last 72 hours.  Invalid input(s): APPERANCEUR    Imaging: No results found.      Medications:    anticoagulant sodium citrate      linezolid  (ZYVOX ) IV 600 mg (03/25/24 0859)   norepinephrine  (LEVOPHED ) Adult infusion Stopped (03/25/24 0732)    alteplase   2 mg Intracatheter Once   Chlorhexidine  Gluconate Cloth  6 each Topical Q0600   epoetin alfa-epbx (RETACRIT) injection  20,000 Units Subcutaneous Weekly   heparin  injection  (subcutaneous)  5,000 Units Subcutaneous Q8H   melatonin  2.5 mg Oral QHS   midodrine   10 mg Oral TID WC   multivitamin  1 tablet Oral QHS   nystatin   5 mL Oral QID   mouth rinse  15 mL Mouth Rinse 4 times per day   pantoprazole  (PROTONIX ) IV  40 mg Intravenous QHS   Ensure Max Protein  11 oz Oral TID   sertraline   25 mg Oral Daily   anticoagulant sodium citrate , docusate sodium , guaiFENesin -dextromethorphan , ipratropium-albuterol , ondansetron  (ZOFRAN ) IV, mouth rinse, phenol, polyethylene glycol, sodium chloride  flush, traZODone   Assessment/ Plan:  78 y.o. 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  Baseline creatinine of 2.5-2.8. Acute kidney injury is thought to be secondary to pneumonia and circulatory shock causing ATN.  Patient responded well to IV Lasix  03/13/24.  However due to low CVP, further doses were held. CRRT stopped 11/22. First HD 11/23.  Update:  Urine output . CRRT stopped. Will perform hemodialysis later today, UF goal 1.5-2L as tolerated. Next treatment scheduled for Wednesday. Patient will require dialysis at discharge. Will request permcath placement  from vascular surgery. Will seek outpatient clinic placement closer to discharge.   Lab Results  Component Value Date   CREATININE 2.82 (H) 03/25/2024   CREATININE 2.13 (H) 03/24/2024   CREATININE 1.80 (H) 03/24/2024     Intake/Output Summary (Last 24 hours) at 03/25/2024 0959 Last data filed at 03/25/2024 0900 Gross per 24 hour  Intake 886.14 ml  Output 450 ml  Net 436.14 ml     Acute respiratory failure Respiratory pathogen's panel by  PCR is negative.  Blood cultures from 03/06/2024 are negative. Weaning supplemental oxygen  as tolerated.   Anemia of CKD.  Hgb remains  7.6. Continue retacrit 20000 units subcutaneous weekly.    LOS: 17 Faith Harris 12/1/20259:59 AM  [address] [place], [place] [phone]

## 2024-03-25 NOTE — Progress Notes (Signed)
 NAME:  Dave Brown, MRN:  [id number], DOB:  [birth year], LOS: 17 ADMISSION DATE:  03/08/2024, CONSULTATION DATE:  03/08/2024  Brief Pt Description / Synopsis:  78 y.o. male admitted with Acute on Chronic HFpEF, Cardiogenic/Septic shock, AKI, Acute Hypoxic Respiratory Failure, and MRSA Pneumonia requiring mechanical ventilation and initiation of CRRT.   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 He denied any fevers, chills, chest pain.     Of note, the patient was recently admitted to the hospital from 02/01/2024 to 02/05/2024 for respiratory failure due to CHF.  The patient did not require oxygen  at the time of his discharge.  His discharge weight was 286.  At his last office visit with his primary provider his weight was 291 pounds on 02/22/24.  Follow up with pulmonary on 03/04/24 office weight 29.   He was discharged home with furosemide  40 mg daily during last hospitalization   ED Course: Initial Vital Signs: afebrile and hemodynamically stable. Oxygen  saturation was 93-97% on 4 L  Significant Labs: WBC 11.0, hemoglobin 12.3, platelets 138. Sodium 141, potassium 4.1, bicarbonate 31, serum creatinine 4.15. proBNP 16,139. PCT 0.13. Lactic acid 1.9 >> 0.8. COVID-negative  Imaging Chest X-ray>>patchy opacities right lower lobe and left lower lobe  Medications Administered: furosemide  40 mg IV, Solu-Medrol  125 mg, ceftriaxone , and azithromycin . He was given albuterol  and Atrovent .    [facility] asked to admit for further workup and treatment.  He gradually became hypotensive requiring initiation of low dose Levophed  (3 to 5 mcg), PCCM was consulted.  Nephrology was consulted for oliguria. Case was  discussed with nephrology and it was felt the patient need to be initiated on CRRT. Subsequently, initial request was made to transfer the patient to [facility]. In order to expedite care, he is being transferred to [facility] regional ICU to critical care service where nephrology will be consulted for continued management of his renal failure and fluid overload.   Please see Significant Hospital Events section below for full detailed hospital course.  Pertinent  Medical History   Past Medical History:  Diagnosis Date   CHF (congestive heart failure) (HCC)    Degenerative joint disease    High cholesterol    Hypertension    Kidney stones    Obesity    Respiratory failure with hypoxia (HCC)    Sleep apnea     Micro Data:  11/12: Blood cultures x2>> no growth 11/13: MRSA PCR>> negative 11/13: RVP>> negative 11/24: MRSA PCR>> negative 11/18: Tracheal Aspirate>> MRSA 11/19: MRSA PCR +  Antimicrobials:   Anti-infectives (From admission, onward)    Start     Dose/Rate Route Frequency Ordered Stop   03/21/24 1015  linezolid  (ZYVOX ) IVPB 600 mg        600 mg 300 mL/hr over 60 Minutes Intravenous Every 12 hours 03/21/24 0918 03/25/24 2359   03/17/24 1300  vancomycin  (VANCOCIN ) IVPB 1000 mg/200 mL premix        1,000 mg 200 mL/hr over 60 Minutes Intravenous  Once 03/17/24 1147 03/17/24 1420   03/16/24 1200  vancomycin  (VANCOCIN ) IVPB 1000 mg/200 mL premix        1,000 mg 200 mL/hr over 60 Minutes Intravenous  Once 03/16/24  1104 03/16/24 2100   03/15/24 2200  piperacillin -tazobactam (ZOSYN ) IVPB 3.375 g        3.375 g 12.5 mL/hr over 240 Minutes Intravenous Every 8 hours 03/15/24 1412 03/17/24 0128   03/15/24 1600  vancomycin  (VANCOREADY) IVPB 1500 mg/300 mL        1,500 mg 150 mL/hr over 120 Minutes Intravenous  Once 03/15/24 1411 03/15/24 1705   03/15/24 1411  vancomycin  variable dose per unstable renal function (pharmacist dosing)  Status:  Discontinued         Does not apply  See admin instructions 03/15/24 1411 03/19/24 1107   03/15/24 1400  vancomycin  (VANCOREADY) IVPB 1500 mg/300 mL  Status:  Discontinued        1,500 mg 150 mL/hr over 120 Minutes Intravenous Every 24 hours 03/14/24 1139 03/15/24 1411   03/14/24 1200  vancomycin  (VANCOREADY) IVPB 2000 mg/400 mL        2,000 mg 200 mL/hr over 120 Minutes Intravenous  Once 03/14/24 1012 03/14/24 1405   03/12/24 1200  piperacillin -tazobactam (ZOSYN ) IVPB 3.375 g  Status:  Discontinued        3.375 g 100 mL/hr over 30 Minutes Intravenous Every 6 hours 03/12/24 1013 03/15/24 1412        Significant Hospital Events: Including procedures, antibiotic start and stop dates in addition to other pertinent events   11/12: Admitted to [facility] to [place] for treatment of Acute Decompensated HFpEF and AKI. 11/13: BP decreased, started on Levophed .  PCCM consulted.  ABX discontinued. 11/14: Transfer to [facility] for initiation of CRRT.  11/15: seizure like episode, intubated 11/16: Unable to wean; PICC line placed 11/21: Extubated to Bipap. Added Dobutamine .  11/23: Patient remains on bipap this AM. WBC stable at 7.6. H&H Stable. [place] SvO2 67%.  Electrolytes within normal range. NE decreased to 4. Remains on Dobutamin at 5.  11/24: Off BiPAP, awake and alert but remains encephalopathic.  Afebrile, Remains on Levophed , currently 10 mcg, weaning as tolerated.  Creatinine increased to 2.9, but UOP 1.4 L (net - 7.8L), Nephrology holding off on HD today. 11/25: No significant events overnight.  Midodrine  started overnight, now off Levophed .   Precedex  weaned off yesterday, remains encephalopathic but slowly improving. On 4L [place]. Creatinine worsened to 3.6, UOP 675 cc last 24 hrs (net - 8.4L), plan for HD today.  Plan for aggressive pulmonary toilet and mobilization as able.  11/26: Remains critically ill; desatted to the 80s overnight, placed on bipap and developed hypotension requiring initiation of levophed  gtt again. Hypercapnic  overnight, will recheck vbg. Nephrology following, placed back on CRRT.  11/27: No acute events overnight, afebrile, remains on Levophed  and CRRT.  Worsening Leukocytosis, obtain Sputum culture and start Linezolid .  Weaned off BiPAP to [place] 11/28: remains on CRRT remains on pressors 12/01: Pt confused this am and refused to keep Bipap on.  Pt stable on 2L O2 via nasal canula no longer requiring vasopressors   Interim History / Subjective:  As outlined above under Significant Hospital Events section  Objective   Blood pressure (!) 94/57, pulse 82, temperature 98.4 F (36.9 C), temperature source Axillary, resp. rate (!) 24, height 5' 9.02 (1.753 m), weight 120.6 kg, SpO2 96%.    FiO2 (%):  [35 %] 35 %   Intake/Output Summary (Last 24 hours) at 03/25/2024 0843 Last data filed at 03/25/2024 0736 Gross per 24 hour  Intake 888.02 ml  Output 546 ml  Net 342.02 ml   Filed Weights   03/23/24 0438  03/24/24 0500 03/25/24 0500  Weight: 126.3 kg 119.7 kg 120.6 kg    Examination: General: Acute on chronically ill appearing obese male, laying in bed, NAD on 2L O2 via nasal canula  HENT: Atramatic, normocephalic, neck supple, difficult to assess JVD due to body habitus Lungs: Diminished throughout, even, non labored  Cardiovascular: NSR, s1s2, no m/r/g, 2+ radial/1+ distal pulses, trace edema  Abdomen: +BS x4, obese, soft, non tender, slightly distended and slightly taught Extremities: Generalized weakness, normal bulk and tone, moves all extremities  Neuro: Alert and following commands, disoriented to situation, PERRLA  GU: External male catheter in place  Resolved Hospital Problem list     Assessment & Plan:   #Acute Metabolic Encephalopathy CT Head negative, EEG normal -Treatment of metabolic derangements as outlined above - Provide supportive care - Promote normal sleep/wake cycle and family presence - Avoid sedating medications as able - Scheduled melatonin and prn trazodone  for  insomnia  - Mobilize as able PT/OT consulted   #Acute on Chronic HFpEF #Shock: Cardiogenic +/- Septic  #Paroxsymal A. Fib Echocardiogram 03/16/24: LVEF >75%, grade I DD, RV systolic function normal, RV size normal - Continuous telemetry monitoring - Maintain map >65: not requiring vasopressors at this time  - Continue midodrine  - Volume removal with HD - Cardiology following, appreciate input~plan for cardiac cath at some point - Initially on heparin  gtt, however discontinued due to mild hemoptysis/hematuria   #Acute Hypoxic Respiratory Failure  #Pulmonary Edema #MRSA Pneumonia~TREATED  Intubated 11/15; Extubated 11/21 - Supplemental O2 as needed to maintain O2 sats >92% - BiPAP qhs and prn  - Follow intermittent Chest X-ray & ABG as needed - Prn bronchodilator therapy  - ABX as above  - Volume removal with HD - Pulmonary toilet as able  #AKI on CKD - Trend BMP  - Monitor I&O's / urinary output - Ensure adequate renal perfusion - Avoid nephrotoxic agents as able - Replace electrolytes as indicated ~ Pharmacy following for assistance with electrolyte replacement - Nephrology following, appreciate input~HD per recommendations  - Will need permcath   #MRSA Pneumonia ~TREATED #Leukocytosis  - Trend WBC and monitor fever curve  -Follow cultures as above -Started empiric Linezolid  on 11/27 pending cultures & sensitivities ~ previously completed 8 days of Vancomycin , however after Vancomycin  completed, pt with worsening respiratory status, worsening leukocytosis, and hypotensive requiring Levophed , therefore resuming MRSA coverage with Linezolid  stop date 03/25/24  #Type II Diabetes Mellitus - CBG's q4h; Target range of 140 to 180 - SSI - Follow ICU Hypo/Hyperglycemia protocol  12/01: Updated pts son Prabhjot Piscitello via telephone regarding pts condition and current plan of care.  He was appreciative to receive an update  Labs   CBC: Recent Labs  Lab 03/21/24 0413  03/22/24 0326 03/23/24 0356 03/24/24 0441 03/25/24 0451  WBC 18.7* 10.2 8.3 9.0 9.2  HGB 9.3* 7.6* 7.7* 7.6* 7.6*  HCT 29.6* 23.6* 24.5* 23.5* 24.0*  MCV 102.1* 100.4* 100.8* 100.0 101.3*  PLT 185 147* 146* 160 159    Basic Metabolic Panel: Recent Labs  Lab 03/21/24 0413 03/21/24 1655 03/22/24 0326 03/22/24 1513 03/23/24 0356 03/23/24 1522 03/24/24 0441 03/24/24 1532 03/25/24 0451  NA 137   < > 133*   < > 134* 136 135 135 138  K 4.6   < > 3.9   < > 4.7 3.8 4.3 4.6 4.9  CL 102   < > 101   < > 102 102 101 101 103  CO2 24   < > 25   < >  25 27 26 26 27   GLUCOSE 136*   < > 154*   < > 156* 106* 99 103* 107*  BUN 43*   < > 29*   < > 22 20 17 18 23   CREATININE 3.22*   < > 2.33*   < > 1.91* 1.81* 1.80* 2.13* 2.82*  CALCIUM  9.2   < > 8.5*   < > 8.6* 8.8* 8.5* 8.8* 9.1  MG 2.2  --  2.3  --  2.3  --  2.3  --  2.3  PHOS 5.4*   < > 2.4*   < > 2.8 1.9* 3.0 3.6 3.8   < > = values in this interval not displayed.   GFR: Estimated Creatinine Clearance: 27.7 mL/min (A) (by C-G formula based on SCr of 2.82 mg/dL (H)). Recent Labs  Lab 03/18/24 1356 03/19/24 0359 03/21/24 0907 03/22/24 0326 03/23/24 0356 03/24/24 0441 03/25/24 0451  WBC  --    < >  --  10.2 8.3 9.0 9.2  LATICACIDVEN 0.8  --  1.5  --   --   --   --    < > = values in this interval not displayed.    Liver Function Tests: Recent Labs  Lab 03/23/24 0356 03/23/24 1522 03/24/24 0441 03/24/24 1532 03/25/24 0451  ALBUMIN 3.0* 3.0* 3.1* 3.1* 3.2*   No results for input(s): LIPASE, AMYLASE in the last 168 hours. No results for input(s): AMMONIA in the last 168 hours.  ABG    Component Value Date/Time   PHART 7.41 03/16/2024 1412   PCO2ART 41 03/16/2024 1412   PO2ART 167 (H) 03/16/2024 1412   HCO3 25.4 03/20/2024 1210   ACIDBASEDEF 3.9 (H) 03/20/2024 1210   O2SAT 74.7 03/20/2024 1210     Coagulation Profile: No results for input(s): INR, PROTIME in the last 168 hours.  Cardiac Enzymes: No  results for input(s): CKTOTAL, CKMB, CKMBINDEX, TROPONINI in the last 168 hours.  HbA1C: Hgb A1c MFr Bld  Date/Time Value Ref Range Status  03/08/2024 03:11 AM 5.2 4.8 - 5.6 % Final    Comment:    (NOTE) Diagnosis of Diabetes The following HbA1c ranges recommended by the American Diabetes Association (ADA) may be used as an aid in the diagnosis of diabetes mellitus.  Hemoglobin             Suggested A1C NGSP%              Diagnosis  <5.7                   Non Diabetic  5.7-6.4                Pre-Diabetic  >6.4                   Diabetic  <7.0                   Glycemic control for                       adults with diabetes.    05/02/2020 02:37 PM 6.9 (H) 4.8 - 5.6 % Final    Comment:    (NOTE) Pre diabetes:          5.7%-6.4%  Diabetes:              >6.4%  Glycemic control for   <7.0% adults with diabetes     CBG: Recent Labs  Lab 03/24/24 1554 03/24/24  1932 03/24/24 2319 03/25/24 0329 03/25/24 0728  GLUCAP 101* 103* 110* 92 96    Review of Systems:   Unable to assess due to AMS   Past Medical History:  He,  has a past medical history of CHF (congestive heart failure) (HCC), Degenerative joint disease, High cholesterol, Hypertension, Kidney stones, Obesity, Respiratory failure with hypoxia (HCC), and Sleep apnea.   Surgical History:  History reviewed. No pertinent surgical history.   Social History:   reports that he quit smoking about 25 years ago. His smoking use included cigars. He does not have any smokeless tobacco history on file. He reports that he does not drink alcohol and does not use drugs.   Family History:  His family history is not on file.   Allergies No Known Allergies   Home Medications  Prior to Admission medications   Medication Sig Start Date End Date Taking? Authorizing Provider  albuterol  (PROVENTIL ) (2.5 MG/3ML) 0.083% nebulizer solution Take 2.5 mg by nebulization every 6 (six) hours as needed for shortness of  breath or wheezing. 03/04/24  Yes [provider]  hydrocortisone  sodium succinate  (SOLU-CORTEF ) 100 MG injection Inject 2 mLs (100 mg total) into the vein every 8 (eight) hours. 03/08/24  Yes Tat, Alm, MD  midodrine  (PROAMATINE ) 10 MG tablet Take 1 tablet (10 mg total) by mouth 3 (three) times daily with meals. 03/08/24  Yes Tat, Alm, MD  norepinephrine  (LEVOPHED ) 4-5 MG/250ML-% SOLN Inject 0-10 mcg/min into the vein continuous. 03/08/24  Yes Tat, Alm, MD  rosuvastatin  (CRESTOR ) 10 MG tablet Take 10 mg by mouth daily.   Yes [provider]  sertraline  (ZOLOFT ) 25 MG tablet Take 25 mg by mouth in the morning.   Yes [provider]  TYLENOL  325 MG tablet Take 325-650 mg by mouth every 8 (eight) hours as needed for mild pain (pain score 1-3) (or headaches).   Yes [provider]     Critical care time: 35 minutes     Lonell Moose, AGNP  Pulmonary/Critical Care Pager [phone] (please enter 7 digits) PCCM Consult Pager [phone] (please enter 7 digits)

## 2024-03-25 NOTE — Progress Notes (Signed)
 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 03/08/24 transferred to [facility] for initiation of CRRT. 03/09/24 seizure like episode, intubated. 03/15/24 extubated.   OT comments  Dave Brown was seen for OT treatment on this date. Upon arrival to room pt in bed, eager and agreeable to tx. Pt requires MIN A exit bed, good sitting balance/tolerance. MOD A + RW sit<>stand, improves to MIN A, +2 safety/lines over 4 trials. SpO2 90% with activity on 2L [place], respiratory rate 30 with activity. MIN A + RW, x2 safety, for bed>chair t/f. Pt making good progress toward goals, will continue to follow POC. Discharge recommendation remains appropriate.       If plan is discharge home, recommend the following:  A lot of help with walking and/or transfers;A lot of help with bathing/dressing/bathroom   Equipment Recommendations  BSC/3in1    Recommendations for Other Services      Precautions / Restrictions Precautions Precautions: Fall Recall of Precautions/Restrictions: Impaired Restrictions Weight Bearing Restrictions Per Provider Order: No       Mobility Bed Mobility Overal bed mobility: Needs Assistance Bed Mobility: Supine to Sit     Supine to sit: Min assist          Transfers Overall transfer level: Needs assistance Equipment used: Rolling walker (2 wheels), 2 person hand held assist Transfers: Sit to/from Stand, Bed to chair/wheelchair/BSC Sit to Stand: Min assist, +2 safety/equipment     Step pivot transfers: Min assist, +2 safety/equipment     General transfer  comment: x4 stands     Balance Overall balance assessment: Needs assistance Sitting-balance support: No upper extremity supported, Feet supported Sitting balance-Leahy Scale: Fair     Standing balance support: Bilateral upper extremity supported Standing balance-Leahy Scale: Poor Standing balance comment: external support required                           ADL either performed or assessed with clinical judgement   ADL Overall ADL's : Needs assistance/impaired                                       General ADL Comments: MAX A don B socks sitting EOB. MIN A + RW for simualted BSC t/f, +2 safety/lines.     Communication Communication Communication: Impaired Factors Affecting Communication: Hearing impaired   Cognition Arousal: Alert Behavior During Therapy: WFL for tasks assessed/performed Cognition: No apparent impairments                               Following commands: Impaired Following commands impaired: Follows one step commands with increased time      Cueing   Cueing Techniques: Verbal cues, Tactile cues        General Comments Sp02 in the 90's with increased respiration rate with activity. frequent seated rest breaks required with mobility    Pertinent Vitals/ Pain       Pain Assessment Pain Assessment:  No/denies pain   Frequency  Min 2X/week        Progress Toward Goals  OT Goals(current goals can now be found in the care plan section)  Progress towards OT goals: Progressing toward goals  Acute Rehab OT Goals OT Goal Formulation: With patient Time For Goal Achievement: 03/31/24 Potential to Achieve Goals: Good ADL Goals Pt Will Perform Grooming: with supervision;standing Pt Will Perform Lower Body Dressing: with supervision;sit to/from stand Pt Will Transfer to Toilet: with modified independence;ambulating;regular height toilet  Plan      Co-evaluation      Reason for Co-Treatment: For  patient/therapist safety;To address functional/ADL transfers PT goals addressed during session: Mobility/safety with mobility OT goals addressed during session: ADL's and self-care      AM-PAC OT 6 Clicks Daily Activity     Outcome Measure   Help from another person eating meals?: None Help from another person taking care of personal grooming?: A Little Help from another person toileting, which includes using toliet, bedpan, or urinal?: A Lot Help from another person bathing (including washing, rinsing, drying)?: A Lot Help from another person to put on and taking off regular upper body clothing?: A Little Help from another person to put on and taking off regular lower body clothing?: A Lot 6 Click Score: 16    End of Session    OT Visit Diagnosis: Other abnormalities of gait and mobility (R26.89);Muscle weakness (generalized) (M62.81)   Activity Tolerance Patient tolerated treatment well   Patient Left in chair;with call bell/phone within reach   Nurse Communication Mobility status        Time: 8996-8973 OT Time Calculation (min): 23 min  Charges: OT General Charges $OT Visit: 1 Visit OT Treatments $Therapeutic Activity: 8-22 mins  Dave Brown, M.S. OTR/L  03/25/24, 12:54 PM  ascom [phone]

## 2024-03-25 NOTE — Progress Notes (Signed)
 Left femoral HD catheter removed per policy. Site covered with vasoline gauze, clean gauze, transparent dressing and pressure held for 5 minutes. No bleeding noted; patient education provided and pt verbalized understanding. Continue to monitor site closely.

## 2024-03-25 NOTE — Plan of Care (Signed)
  Problem: Education: Goal: Knowledge of General Education information will improve Description: Including pain rating scale, medication(s)/side effects and non-pharmacologic comfort measures 03/25/2024 1800 by Adolm Cecilio RAMAN, RN Outcome: Progressing 03/25/2024 1800 by Adolm Cecilio RAMAN, RN Outcome: Progressing   Problem: Health Behavior/Discharge Planning: Goal: Ability to manage health-related needs will improve 03/25/2024 1800 by Adolm Cecilio RAMAN, RN Outcome: Progressing 03/25/2024 1800 by Adolm Cecilio RAMAN, RN Outcome: Progressing   Problem: Clinical Measurements: Goal: Ability to maintain clinical measurements within normal limits will improve Outcome: Progressing Goal: Will remain free from infection Outcome: Progressing Goal: Diagnostic test results will improve Outcome: Progressing Goal: Respiratory complications will improve Outcome: Progressing Goal: Cardiovascular complication will be avoided Outcome: Progressing

## 2024-03-25 NOTE — Progress Notes (Signed)
 Verbal order received from Almarie Nose, NP to remove Left femoral HD catheter.

## 2024-03-26 LAB — CBC WITH DIFFERENTIAL/PLATELET
Abs Immature Granulocytes: 0.15 K/uL — ABNORMAL HIGH (ref 0.00–0.07)
Basophils Absolute: 0 K/uL (ref 0.0–0.1)
Basophils Relative: 0 %
Eosinophils Absolute: 0 K/uL (ref 0.0–0.5)
Eosinophils Relative: 0 %
HCT: 22 % — ABNORMAL LOW (ref 39.0–52.0)
Hemoglobin: 7.1 g/dL — ABNORMAL LOW (ref 13.0–17.0)
Immature Granulocytes: 1 %
Lymphocytes Relative: 6 %
Lymphs Abs: 1.1 K/uL (ref 0.7–4.0)
MCH: 32.4 pg (ref 26.0–34.0)
MCHC: 32.3 g/dL (ref 30.0–36.0)
MCV: 100.5 fL — ABNORMAL HIGH (ref 80.0–100.0)
Monocytes Absolute: 0.6 K/uL (ref 0.1–1.0)
Monocytes Relative: 3 %
Neutro Abs: 16.7 K/uL — ABNORMAL HIGH (ref 1.7–7.7)
Neutrophils Relative %: 90 %
Platelets: 144 K/uL — ABNORMAL LOW (ref 150–400)
RBC: 2.19 MIL/uL — ABNORMAL LOW (ref 4.22–5.81)
RDW: 14.5 % (ref 11.5–15.5)
WBC: 18.6 K/uL — ABNORMAL HIGH (ref 4.0–10.5)
nRBC: 0 % (ref 0.0–0.2)

## 2024-03-26 LAB — RENAL FUNCTION PANEL
Albumin: 3.1 g/dL — ABNORMAL LOW (ref 3.5–5.0)
Anion gap: 8 (ref 5–15)
BUN: 24 mg/dL — ABNORMAL HIGH (ref 8–23)
CO2: 25 mmol/L (ref 22–32)
Calcium: 8.9 mg/dL (ref 8.9–10.3)
Chloride: 99 mmol/L (ref 98–111)
Creatinine, Ser: 3.23 mg/dL — ABNORMAL HIGH (ref 0.61–1.24)
GFR, Estimated: 19 mL/min — ABNORMAL LOW (ref >60)
Glucose, Bld: 122 mg/dL — ABNORMAL HIGH (ref 70–99)
Phosphorus: 4.2 mg/dL (ref 2.5–4.6)
Potassium: 5.1 mmol/L (ref 3.5–5.1)
Sodium: 132 mmol/L — ABNORMAL LOW (ref 135–145)

## 2024-03-26 LAB — GLUCOSE, CAPILLARY
Glucose-Capillary: 120 mg/dL — ABNORMAL HIGH (ref 70–99)
Glucose-Capillary: 119 mg/dL — ABNORMAL HIGH (ref 70–99)
Glucose-Capillary: 112 mg/dL — ABNORMAL HIGH (ref 70–99)
Glucose-Capillary: 105 mg/dL — ABNORMAL HIGH (ref 70–99)
Glucose-Capillary: 65 mg/dL — ABNORMAL LOW (ref 70–99)
Glucose-Capillary: 153 mg/dL — ABNORMAL HIGH (ref 70–99)

## 2024-03-26 LAB — MAGNESIUM: Magnesium: 2.1 mg/dL (ref 1.7–2.4)

## 2024-03-26 MED ORDER — VITAMIN C 500 MG PO TABS
500.0000 mg | ORAL_TABLET | Freq: Two times a day (BID) | ORAL | Status: DC
  Administered 2024-03-26 – 2024-04-05 (×18): 500 mg via ORAL
  Filled 2024-03-26 (×18): qty 1

## 2024-03-26 MED ORDER — NEPRO/CARBSTEADY PO LIQD
237.0000 mL | Freq: Three times a day (TID) | ORAL | Status: DC
  Administered 2024-03-26 – 2024-04-02 (×12): 237 mL via ORAL

## 2024-03-26 MED ORDER — ZINC SULFATE 220 (50 ZN) MG PO CAPS
220.0000 mg | ORAL_CAPSULE | Freq: Every day | ORAL | Status: DC
  Administered 2024-03-27 – 2024-04-05 (×9): 220 mg via ORAL
  Filled 2024-03-26 (×9): qty 1

## 2024-03-26 NOTE — Progress Notes (Signed)
 [facility] [place], [place] 03/26/24  Subjective:   Hospital day # 19 78 year old male with hypertension, heart failure with preserved ejection fraction, hyperlipidemia, stage IV CKD, obstructive sleep apnea, MGUS, chronic respiratory failure on 2 L oxygen  presented from [facility] with shortness of breath.  Transferred to [facility] for need of continuous dialysis.  Update:  Patient underwent hemodialysis treatment yesterday.  Flow issues with dialysis catheter noted. UF achieved was 0.5 kg. Activase  instilled in catheter. Due for dialysis treatment again tomorrow.  Objective:  Vital signs in last 24 hours:  Temp:  [97.9 F (36.6 C)-100.1 F (37.8 C)] 100.1 F (37.8 C) (12/02 0309) Pulse Rate:  [78-103] 86 (12/02 0600) Resp:  [20-32] 24 (12/02 0600) BP: (70-115)/(46-71) 92/62 (12/02 0309) SpO2:  [90 %-100 %] 99 % (12/02 0600) FiO2 (%):  [35 %] 35 % (12/02 0100) Weight:  [119.8 kg] 119.8 kg (12/02 0500)  Weight change: -0.8 kg Filed Weights   03/24/24 0500 03/25/24 0500 03/26/24 0500  Weight: 119.7 kg 120.6 kg 119.8 kg    Intake/Output:    Intake/Output Summary (Last 24 hours) at 03/26/2024 0753 Last data filed at 03/26/2024 0400 Gross per 24 hour  Intake 1420 ml  Output 675 ml  Net 745 ml     Physical Exam: General: Ill-appearing elderly  Pulm/lungs Scattered rhonchi, [place] O2  CVS/Heart S1S2 no rubs  Abdomen:  Distended, nontender  Extremities: 2+ bilateral lower extremity edema  Neurologic: Alert, hard of hearing  Skin: Warm, dry  Access: Left femoral temporary dialysis catheter       Basic Metabolic Panel:  Recent Labs  Lab 03/22/24 0326 03/22/24 1513 03/23/24 0356 03/23/24 1522 03/24/24 0441 03/24/24 1532 03/25/24 0451 03/26/24 0510  NA 133*   < > 134* 136 135 135 138 132*  K 3.9   < > 4.7 3.8 4.3 4.6 4.9 5.1  CL 101   < > 102 102 101 101 103 99  CO2 25   < > 25 27 26 26 27 25   GLUCOSE 154*   < > 156* 106* 99 103* 107*  122*  BUN 29*   < > 22 20 17 18 23  24*  CREATININE 2.33*   < > 1.91* 1.81* 1.80* 2.13* 2.82* 3.23*  CALCIUM  8.5*   < > 8.6* 8.8* 8.5* 8.8* 9.1 8.9  MG 2.3  --  2.3  --  2.3  --  2.3 2.1  PHOS 2.4*   < > 2.8 1.9* 3.0 3.6 3.8 4.2   < > = values in this interval not displayed.     CBC: Recent Labs  Lab 03/22/24 0326 03/23/24 0356 03/24/24 0441 03/25/24 0451 03/26/24 0510  WBC 10.2 8.3 9.0 9.2 18.6*  NEUTROABS  --   --   --   --  16.7*  HGB 7.6* 7.7* 7.6* 7.6* 7.1*  HCT 23.6* 24.5* 23.5* 24.0* 22.0*  MCV 100.4* 100.8* 100.0 101.3* 100.5*  PLT 147* 146* 160 159 144*      Lab Results  Component Value Date   HEPBSAG NON REACTIVE 03/16/2024      Microbiology:  Recent Results (from the past 240 hours)  Expectorated Sputum Assessment w Gram Stain, Rflx to Resp Cult     Status: None   Collection Time: 03/21/24  5:20 PM   Specimen: Throat; Sputum  Result Value Ref Range Status   Specimen Description THROAT  Final   Special Requests NONE  Final   Sputum evaluation   Final  Sputum specimen not acceptable for testing.  Please recollect.   RECOLLECT CH[address]QUITA [address] 03/21/2024 [facility] Performed at [facility], [address] [place], [place] [address]    Report Status 03/21/2024 FINAL  Final    Coagulation Studies: No results for input(s): LABPROT, INR in the last 72 hours.   Urinalysis: No results for input(s): COLORURINE, LABSPEC, PHURINE, GLUCOSEU, HGBUR, BILIRUBINUR, KETONESUR, PROTEINUR, UROBILINOGEN, NITRITE, LEUKOCYTESUR in the last 72 hours.  Invalid input(s): APPERANCEUR    Imaging: No results found.      Medications:    anticoagulant sodium citrate      norepinephrine  (LEVOPHED ) Adult infusion Stopped (03/25/24 0732)    alteplase   2 mg Intracatheter Once   Chlorhexidine  Gluconate Cloth  6 each Topical Q0600   diphenhydrAMINE   25 mg Intravenous Once   epoetin  alfa-epbx (RETACRIT ) injection  20,000 Units  Subcutaneous Weekly   heparin  injection (subcutaneous)  5,000 Units Subcutaneous Q8H   melatonin  2.5 mg Oral QHS   midodrine   10 mg Oral TID WC   multivitamin  1 tablet Oral QHS   nystatin   5 mL Oral QID   mouth rinse  15 mL Mouth Rinse 4 times per day   pantoprazole  (PROTONIX ) IV  40 mg Intravenous QHS   Ensure Max Protein  11 oz Oral TID   sertraline   25 mg Oral Daily   anticoagulant sodium citrate , docusate sodium , guaiFENesin -dextromethorphan , heparin , ipratropium-albuterol , ondansetron  (ZOFRAN ) IV, mouth rinse, phenol, polyethylene glycol, sodium chloride  flush, traZODone   Assessment/ Plan:  78 y.o. 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  Baseline creatinine of 2.5-2.8. Acute kidney injury is thought to be secondary to pneumonia and circulatory shock causing ATN.  Patient responded well to IV Lasix  03/13/24.  However due to low CVP, further doses were held. CRRT stopped 11/22. First HD 11/23.  Update:  Underwent hemodialysis treatment yesterday.  Catheter flow issues noted.  Activase  instilled.  We will likely need PermCath soon.  Next dialysis treatment scheduled for tomorrow.  Lab Results  Component Value Date   CREATININE 3.23 (H) 03/26/2024   CREATININE 2.82 (H) 03/25/2024   CREATININE 2.13 (H) 03/24/2024     Intake/Output Summary (Last 24 hours) at 03/26/2024 0753 Last data filed at 03/26/2024 0400 Gross per 24 hour  Intake 1420 ml  Output 675 ml  Net 745 ml     Acute respiratory failure Respiratory pathogen's panel by PCR is negative.  Blood cultures from 03/06/2024 are negative. Uses CPAP at  night.  Currently on nasal cannula.  Anemia of CKD.  Hemoglobin down to 7.1.  No immediate need for transfusion but consider if hemoglobin drops to 7 or less.  Continue retacrit  20000 units subcutaneous weekly.    LOS: 18 Laureen Frederic 12/2/20257:53 AM  [address] [place], [place] [phone]

## 2024-03-26 NOTE — Progress Notes (Deleted)
 Cardiology Clinic Note   Patient Name: Dave Brown Date of Encounter: 03/26/2024  Primary Care Provider:  Debrah Josette ORN., PA-C Primary Cardiologist:  Newman JINNY Lawrence, MD  Patient Profile    ***  Past Medical History    Past Medical History:  Diagnosis Date   CHF (congestive heart failure) (HCC)    Degenerative joint disease    High cholesterol    Hypertension    Kidney stones    Obesity    Respiratory failure with hypoxia (HCC)    Sleep apnea    No past surgical history on file.  Allergies  No Known Allergies  History of Present Illness    ***  Home Medications    Prior to Admission medications   Medication Sig Start Date End Date Taking? Authorizing Provider  albuterol  (PROVENTIL ) (2.5 MG/3ML) 0.083% nebulizer solution Take 2.5 mg by nebulization every 6 (six) hours as needed for shortness of breath or wheezing. 03/04/24   [provider]  hydrocortisone  sodium succinate  (SOLU-CORTEF ) 100 MG injection Inject 2 mLs (100 mg total) into the vein every 8 (eight) hours. 03/08/24   Evonnie Lenis, MD  midodrine  (PROAMATINE ) 10 MG tablet Take 1 tablet (10 mg total) by mouth 3 (three) times daily with meals. 03/08/24   Evonnie Lenis, MD  norepinephrine  (LEVOPHED ) 4-5 MG/250ML-% SOLN Inject 0-10 mcg/min into the vein continuous. 03/08/24   Evonnie Lenis, MD  rosuvastatin  (CRESTOR ) 10 MG tablet Take 10 mg by mouth daily.    [provider]  sertraline  (ZOLOFT ) 25 MG tablet Take 25 mg by mouth in the morning.    [provider]  TYLENOL  325 MG tablet Take 325-650 mg by mouth every 8 (eight) hours as needed for mild pain (pain score 1-3) (or headaches).    [provider]    Family History    No family history on file. He indicated that his mother is deceased. He indicated that his father is deceased. He indicated that his sister is alive.  Social History    Social History   Socioeconomic History   Marital status: Widowed     Spouse name: Not on file   Number of children: 4   Years of education: Not on file   Highest education level: Not on file  Occupational History   Not on file  Tobacco Use   Smoking status: Former    Types: Cigars    Quit date: 05/07/1998    Years since quitting: 25.9   Smokeless tobacco: Not on file  Vaping Use   Vaping status: Never Used  Substance and Sexual Activity   Alcohol use: No   Drug use: No   Sexual activity: Not on file    Comment: pt. stated,  I smoked 5 cigars a day.  Other Topics Concern   Not on file  Social History Narrative   Not on file   Social Drivers of Health   Financial Resource Strain: Not on file  Food Insecurity: No Food Insecurity (03/07/2024)   Hunger Vital Sign    Worried About Running Out of Food in the Last Year: Never true    Ran Out of Food in the Last Year: Never true  Transportation Needs: No Transportation Needs (03/07/2024)   PRAPARE - Administrator, Civil Service (Medical): No    Lack of Transportation (Non-Medical): No  Physical Activity: Not on file  Stress: Not on file  Social Connections: Unknown (03/07/2024)   Social Connection and Isolation  Panel    Frequency of Communication with Friends and Family: Three times a week    Frequency of Social Gatherings with Friends and Family: Three times a week    Attends Religious Services: Patient declined    Active Member of Clubs or Organizations: Patient declined    Attends Banker Meetings: Patient declined    Marital Status: Widowed  Intimate Partner Violence: Not At Risk (03/07/2024)   Humiliation, Afraid, Rape, and Kick questionnaire    Fear of Current or Ex-Partner: No    Emotionally Abused: No    Physically Abused: No    Sexually Abused: No     Review of Systems    General:  No chills, fever, night sweats or weight changes.  Cardiovascular:  No chest pain, dyspnea on exertion, edema, orthopnea, palpitations, paroxysmal nocturnal  dyspnea. Dermatological: No rash, lesions/masses Respiratory: No cough, dyspnea Urologic: No hematuria, dysuria Abdominal:   No nausea, vomiting, diarrhea, bright red blood per rectum, melena, or hematemesis Neurologic:  No visual changes, wkns, changes in mental status. All other systems reviewed and are otherwise negative except as noted above.  Physical Exam    VS:  There were no vitals taken for this visit. , BMI There is no height or weight on file to calculate BMI. GEN: Well nourished, well developed, in no acute distress. HEENT: normal. Neck: Supple, no JVD, carotid bruits, or masses. Cardiac: RRR, no murmurs, rubs, or gallops. No clubbing, cyanosis, edema.  Radials/DP/PT 2+ and equal bilaterally.  Respiratory:  Respirations regular and unlabored, clear to auscultation bilaterally. GI: Soft, nontender, nondistended, BS + x 4. MS: no deformity or atrophy. Skin: warm and dry, no rash. Neuro:  Strength and sensation are intact. Psych: Normal affect.  Accessory Clinical Findings    Recent Labs: 03/06/2024: Pro Brain Natriuretic Peptide 16,139.0 03/08/2024: TSH 0.668 03/12/2024: ALT 12 03/26/2024: BUN 24; Creatinine, Ser 3.23; Hemoglobin 7.1; Magnesium 2.1; Platelets 144; Potassium 5.1; Sodium 132   Recent Lipid Panel    Component Value Date/Time   TRIG 155 (H) 03/16/2024 0414         ECG personally reviewed by me today- ***          Assessment & Plan   1.  ***   Josefa HERO. Diahn Waidelich NP-C     03/26/2024, 9:09 AM [facility] [address] [place], [place] [address] Office [phone]    Notice: This dictation was prepared with Dragon dictation along with smaller phrase technology. Any transcriptional errors that result from this process are unintentional and may not be corrected upon review.   I spent***minutes examining this patient, reviewing medications, and using patient centered shared decision making involving  their cardiac care.   I spent  20 minutes reviewing past medical history,  medications, and prior cardiac tests.

## 2024-03-26 NOTE — Plan of Care (Signed)
  Problem: Education: Goal: Knowledge of General Education information will improve Description: Including pain rating scale, medication(s)/side effects and non-pharmacologic comfort measures Outcome: Progressing   Problem: Clinical Measurements: Goal: Ability to maintain clinical measurements within normal limits will improve Outcome: Progressing Goal: Will remain free from infection Outcome: Progressing Goal: Diagnostic test results will improve Outcome: Progressing Goal: Respiratory complications will improve Outcome: Progressing Goal: Cardiovascular complication will be avoided Outcome: Progressing   Problem: Activity: Goal: Risk for activity intolerance will decrease Outcome: Progressing   Problem: Nutrition: Goal: Adequate nutrition will be maintained Outcome: Progressing   Problem: Elimination: Goal: Will not experience complications related to bowel motility Outcome: Progressing

## 2024-03-26 NOTE — Progress Notes (Signed)
 NAME:  Dave Brown, MRN:  [id number], DOB:  1946/02/22, LOS: 18 ADMISSION DATE:  03/08/2024, CONSULTATION DATE:  03/08/2024  Brief Pt Description / Synopsis:  78 y.o. male admitted with Acute on Chronic HFpEF, Cardiogenic/Septic shock, AKI, Acute Hypoxic Respiratory Failure, and MRSA Pneumonia requiring mechanical ventilation and initiation of CRRT.   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 He denied any fevers, chills, chest pain.     Of note, the patient was recently admitted to the hospital from 02/01/2024 to 02/05/2024 for respiratory failure due to CHF.  The patient did not require oxygen  at the time of his discharge.  His discharge weight was 286.  At his last office visit with his primary provider his weight was 291 pounds on 02/22/24.  Follow up with pulmonary on 03/04/24 office weight 29.   He was discharged home with furosemide  40 mg daily during last hospitalization   ED Course: Initial Vital Signs: afebrile and hemodynamically stable. Oxygen  saturation was 93-97% on 4 L  Significant Labs: WBC 11.0, hemoglobin 12.3, platelets 138. Sodium 141, potassium 4.1, bicarbonate 31, serum creatinine 4.15. proBNP 16,139. PCT 0.13. Lactic acid 1.9 >> 0.8. COVID-negative  Imaging Chest X-ray>>patchy opacities right lower lobe and left lower lobe  Medications Administered: furosemide  40 mg IV, Solu-Medrol  125 mg, ceftriaxone , and azithromycin . He was given albuterol  and Atrovent .    [facility] asked to admit for further workup and treatment.  He gradually became hypotensive requiring initiation of low dose Levophed  (3 to 5 mcg), PCCM was consulted.  Nephrology was consulted for oliguria. Case was  discussed with nephrology and it was felt the patient need to be initiated on CRRT. Subsequently, initial request was made to transfer the patient to [facility]. In order to expedite care, he is being transferred to [facility] regional ICU to critical care service where nephrology will be consulted for continued management of his renal failure and fluid overload.   Please see Significant Hospital Events section below for full detailed hospital course.  03/26/24- patient for permcath placement. He is eating on his own but still behind on calories. He is being moved to [facility] and meds are refined today.   Pertinent  Medical History   Past Medical History:  Diagnosis Date   CHF (congestive heart failure) (HCC)    Degenerative joint disease    High cholesterol    Hypertension    Kidney stones    Obesity    Respiratory failure with hypoxia (HCC)    Sleep apnea     Micro Data:  11/12: Blood cultures x2>> no growth 11/13: MRSA PCR>> negative 11/13: RVP>> negative 11/24: MRSA PCR>> negative 11/18: Tracheal Aspirate>> MRSA 11/19: MRSA PCR +  Antimicrobials:   Anti-infectives (From admission, onward)    Start     Dose/Rate Route Frequency Ordered Stop   03/21/24 1015  linezolid  (ZYVOX ) IVPB 600 mg        600 mg 300 mL/hr over 60 Minutes Intravenous Every 12 hours 03/21/24 0918 03/25/24 2238   03/17/24 1300  vancomycin  (VANCOCIN ) IVPB 1000 mg/200 mL premix        1,000 mg 200 mL/hr over 60 Minutes Intravenous  Once 03/17/24 1147 03/17/24 1420  03/16/24 1200  vancomycin  (VANCOCIN ) IVPB 1000 mg/200 mL premix        1,000 mg 200 mL/hr over 60 Minutes Intravenous  Once 03/16/24 1104 03/16/24 2100   03/15/24 2200  piperacillin -tazobactam (ZOSYN ) IVPB 3.375 g        3.375 g 12.5 mL/hr over 240 Minutes Intravenous Every 8 hours 03/15/24 1412 03/17/24 0128   03/15/24 1600  vancomycin  (VANCOREADY) IVPB 1500 mg/300 mL        1,500 mg 150 mL/hr over 120 Minutes Intravenous  Once 03/15/24 1411  03/15/24 1705   03/15/24 1411  vancomycin  variable dose per unstable renal function (pharmacist dosing)  Status:  Discontinued         Does not apply See admin instructions 03/15/24 1411 03/19/24 1107   03/15/24 1400  vancomycin  (VANCOREADY) IVPB 1500 mg/300 mL  Status:  Discontinued        1,500 mg 150 mL/hr over 120 Minutes Intravenous Every 24 hours 03/14/24 1139 03/15/24 1411   03/14/24 1200  vancomycin  (VANCOREADY) IVPB 2000 mg/400 mL        2,000 mg 200 mL/hr over 120 Minutes Intravenous  Once 03/14/24 1012 03/14/24 1405   03/12/24 1200  piperacillin -tazobactam (ZOSYN ) IVPB 3.375 g  Status:  Discontinued        3.375 g 100 mL/hr over 30 Minutes Intravenous Every 6 hours 03/12/24 1013 03/15/24 1412        Significant Hospital Events: Including procedures, antibiotic start and stop dates in addition to other pertinent events   11/12: Admitted to [facility] to [place] for treatment of Acute Decompensated HFpEF and AKI. 11/13: BP decreased, started on Levophed .  PCCM consulted.  ABX discontinued. 11/14: Transfer to [facility] for initiation of CRRT.  11/15: seizure like episode, intubated 11/16: Unable to wean; PICC line placed 11/21: Extubated to Bipap. Added Dobutamine .  11/23: Patient remains on bipap this AM. WBC stable at 7.6. H&H Stable. [place]ox SvO2 67%.  Electrolytes within normal range. NE decreased to 4. Remains on Dobutamin at 5.  11/24: Off BiPAP, awake and alert but remains encephalopathic.  Afebrile, Remains on Levophed , currently 10 mcg, weaning as tolerated.  Creatinine increased to 2.9, but UOP 1.4 L (net - 7.8L), Nephrology holding off on HD today. 11/25: No significant events overnight.  Midodrine  started overnight, now off Levophed .   Precedex  weaned off yesterday, remains encephalopathic but slowly improving. On 4L [place]. Creatinine worsened to 3.6, UOP 675 cc last 24 hrs (net - 8.4L), plan for HD today.  Plan for aggressive pulmonary toilet and mobilization as able.  11/26:  Remains critically ill; desatted to the 80s overnight, placed on bipap and developed hypotension requiring initiation of levophed  gtt again. Hypercapnic overnight, will recheck vbg. Nephrology following, placed back on CRRT.  11/27: No acute events overnight, afebrile, remains on Levophed  and CRRT.  Worsening Leukocytosis, obtain Sputum culture and start Linezolid .  Weaned off BiPAP to [place] 11/28: remains on CRRT remains on pressors 12/01: Pt confused this am and refused to keep Bipap on.  Pt stable on 2L O2 via nasal canula no longer requiring vasopressors    Objective   Blood pressure 92/62, pulse 86, temperature 100.1 F (37.8 C), temperature source Axillary, resp. rate (!) 24, height 5' 9.02 (1.753 m), weight 119.8 kg, SpO2 99%.    FiO2 (%):  [35 %] 35 %   Intake/Output Summary (Last 24 hours) at 03/26/2024 0800 Last data filed at 03/26/2024 0400 Gross per 24 hour  Intake 1420 ml  Output  675 ml  Net 745 ml   Filed Weights   03/24/24 0500 03/25/24 0500 03/26/24 0500  Weight: 119.7 kg 120.6 kg 119.8 kg    Examination: General: Acute on chronically ill appearing obese male, laying in bed, NAD on 2L O2 via nasal canula  HENT: Atramatic, normocephalic, neck supple, difficult to assess JVD due to body habitus Lungs: Diminished throughout, even, non labored  Cardiovascular: NSR, s1s2, no m/r/g, 2+ radial/1+ distal pulses, trace edema  Abdomen: +BS x4, obese, soft, non tender, slightly distended and slightly taught Extremities: Generalized weakness, normal bulk and tone, moves all extremities  Neuro: Alert and following commands, disoriented to situation, PERRLA  GU: External male catheter in place  Resolved Hospital Problem list     Assessment & Plan:   #Acute Metabolic Encephalopathy CT Brown negative, EEG normal -Treatment of metabolic derangements as outlined above - Provide supportive care - Promote normal sleep/wake cycle and family presence - Avoid sedating medications as  able - Scheduled melatonin and prn trazodone  for insomnia  - Mobilize as able PT/OT consulted   #Acute on Chronic HFpEF #Shock: Cardiogenic +/- Septic  #Paroxsymal A. Fib Echocardiogram 03/16/24: LVEF >75%, grade I DD, RV systolic function normal, RV size normal - Continuous telemetry monitoring - Maintain map >65: not requiring vasopressors at this time  - Continue midodrine  - Volume removal with HD - Cardiology following, appreciate input~plan for cardiac cath at some point - Initially on heparin  gtt, however discontinued due to mild hemoptysis/hematuria   #Acute Hypoxic Respiratory Failure  #Pulmonary Edema #MRSA Pneumonia~TREATED  Intubated 11/15; Extubated 11/21 - Supplemental O2 as needed to maintain O2 sats >92% - BiPAP qhs and prn  - Follow intermittent Chest X-ray & ABG as needed - Prn bronchodilator therapy  - ABX as above  - Volume removal with HD - Pulmonary toilet as able  #AKI on CKD - Trend BMP  - Monitor I&O's / urinary output - Ensure adequate renal perfusion - Avoid nephrotoxic agents as able - Replace electrolytes as indicated ~ Pharmacy following for assistance with electrolyte replacement - Nephrology following, appreciate input~HD per recommendations  - Will need permcath   #MRSA Pneumonia ~TREATED #Leukocytosis  - Trend WBC and monitor fever curve  -Follow cultures as above -Started empiric Linezolid  on 11/27 pending cultures & sensitivities ~ previously completed 8 days of Vancomycin , however after Vancomycin  completed, pt with worsening respiratory status, worsening leukocytosis, and hypotensive requiring Levophed , therefore resuming MRSA coverage with Linezolid  stop date 03/25/24  #Type II Diabetes Mellitus - CBG's q4h; Target range of 140 to 180 - SSI - Follow ICU Hypo/Hyperglycemia protocol  12/01: Updated pts son Zayaan Kozak via telephone regarding pts condition and current plan of care.  He was appreciative to receive an update  Labs    CBC: Recent Labs  Lab 03/22/24 0326 03/23/24 0356 03/24/24 0441 03/25/24 0451 03/26/24 0510  WBC 10.2 8.3 9.0 9.2 18.6*  NEUTROABS  --   --   --   --  16.7*  HGB 7.6* 7.7* 7.6* 7.6* 7.1*  HCT 23.6* 24.5* 23.5* 24.0* 22.0*  MCV 100.4* 100.8* 100.0 101.3* 100.5*  PLT 147* 146* 160 159 144*    Basic Metabolic Panel: Recent Labs  Lab 03/22/24 0326 03/22/24 1513 03/23/24 0356 03/23/24 1522 03/24/24 0441 03/24/24 1532 03/25/24 0451 03/26/24 0510  NA 133*   < > 134* 136 135 135 138 132*  K 3.9   < > 4.7 3.8 4.3 4.6 4.9 5.1  CL 101   < >  102 102 101 101 103 99  CO2 25   < > 25 27 26 26 27 25   GLUCOSE 154*   < > 156* 106* 99 103* 107* 122*  BUN 29*   < > 22 20 17 18 23  24*  CREATININE 2.33*   < > 1.91* 1.81* 1.80* 2.13* 2.82* 3.23*  CALCIUM  8.5*   < > 8.6* 8.8* 8.5* 8.8* 9.1 8.9  MG 2.3  --  2.3  --  2.3  --  2.3 2.1  PHOS 2.4*   < > 2.8 1.9* 3.0 3.6 3.8 4.2   < > = values in this interval not displayed.   GFR: Estimated Creatinine Clearance: 24.1 mL/min (A) (by C-G formula based on SCr of 3.23 mg/dL (H)). Recent Labs  Lab 03/21/24 0907 03/22/24 0326 03/23/24 0356 03/24/24 0441 03/25/24 0451 03/26/24 0510  WBC  --    < > 8.3 9.0 9.2 18.6*  LATICACIDVEN 1.5  --   --   --   --   --    < > = values in this interval not displayed.    Liver Function Tests: Recent Labs  Lab 03/23/24 1522 03/24/24 0441 03/24/24 1532 03/25/24 0451 03/26/24 0510  ALBUMIN 3.0* 3.1* 3.1* 3.2* 3.1*   No results for input(s): LIPASE, AMYLASE in the last 168 hours. No results for input(s): AMMONIA in the last 168 hours.  ABG    Component Value Date/Time   PHART 7.41 03/16/2024 1412   PCO2ART 41 03/16/2024 1412   PO2ART 167 (H) 03/16/2024 1412   HCO3 25.4 03/20/2024 1210   ACIDBASEDEF 3.9 (H) 03/20/2024 1210   O2SAT 74.7 03/20/2024 1210     Coagulation Profile: No results for input(s): INR, PROTIME in the last 168 hours.  Cardiac Enzymes: No results for input(s):  CKTOTAL, CKMB, CKMBINDEX, TROPONINI in the last 168 hours.  HbA1C: Hgb A1c MFr Bld  Date/Time Value Ref Range Status  03/08/2024 03:11 AM 5.2 4.8 - 5.6 % Final    Comment:    (NOTE) Diagnosis of Diabetes The following HbA1c ranges recommended by the American Diabetes Association (ADA) may be used as an aid in the diagnosis of diabetes mellitus.  Hemoglobin             Suggested A1C NGSP%              Diagnosis  <5.7                   Non Diabetic  5.7-6.4                Pre-Diabetic  >6.4                   Diabetic  <7.0                   Glycemic control for                       adults with diabetes.    05/02/2020 02:37 PM 6.9 (H) 4.8 - 5.6 % Final    Comment:    (NOTE) Pre diabetes:          5.7%-6.4%  Diabetes:              >6.4%  Glycemic control for   <7.0% adults with diabetes     CBG: Recent Labs  Lab 03/25/24 1630 03/25/24 1952 03/25/24 2356 03/26/24 0358 03/26/24 0739  GLUCAP 109* 118* 118* 120* 119*  Review of Systems:   Unable to assess due to AMS   Past Medical History:  He,  has a past medical history of CHF (congestive heart failure) (HCC), Degenerative joint disease, High cholesterol, Hypertension, Kidney stones, Obesity, Respiratory failure with hypoxia (HCC), and Sleep apnea.   Surgical History:  History reviewed. No pertinent surgical history.   Social History:   reports that he quit smoking about 25 years ago. His smoking use included cigars. He does not have any smokeless tobacco history on file. He reports that he does not drink alcohol and does not use drugs.   Family History:  His family history is not on file.   Allergies No Known Allergies   Home Medications  Prior to Admission medications   Medication Sig Start Date End Date Taking? Authorizing Provider  albuterol  (PROVENTIL ) (2.5 MG/3ML) 0.083% nebulizer solution Take 2.5 mg by nebulization every 6 (six) hours as needed for shortness of breath or wheezing.  03/04/24  Yes [provider]  hydrocortisone  sodium succinate  (SOLU-CORTEF ) 100 MG injection Inject 2 mLs (100 mg total) into the vein every 8 (eight) hours. 03/08/24  Yes Tat, Alm, MD  midodrine  (PROAMATINE ) 10 MG tablet Take 1 tablet (10 mg total) by mouth 3 (three) times daily with meals. 03/08/24  Yes Tat, Alm, MD  norepinephrine  (LEVOPHED ) 4-5 MG/250ML-% SOLN Inject 0-10 mcg/min into the vein continuous. 03/08/24  Yes Tat, Alm, MD  rosuvastatin  (CRESTOR ) 10 MG tablet Take 10 mg by mouth daily.   Yes [provider]  sertraline  (ZOLOFT ) 25 MG tablet Take 25 mg by mouth in the morning.   Yes [provider]  TYLENOL  325 MG tablet Take 325-650 mg by mouth every 8 (eight) hours as needed for mild pain (pain score 1-3) (or headaches).   Yes [provider]     Critical care provider statement:   Total critical care time: 33 minutes   Performed by: Parris MD   Critical care time was exclusive of separately billable procedures and treating other patients.   Critical care was necessary to treat or prevent imminent or life-threatening deterioration.   Critical care was time spent personally by me on the following activities: development of treatment plan with patient and/or surrogate as well as nursing, discussions with consultants, evaluation of patient's response to treatment, examination of patient, obtaining history from patient or surrogate, ordering and performing treatments and interventions, ordering and review of laboratory studies, ordering and review of radiographic studies, pulse oximetry and re-evaluation of patient's condition.    Makari Portman, M.D.  Pulmonary & Critical Care Medicine

## 2024-03-26 NOTE — Progress Notes (Signed)
 Nutrition Follow-up  DOCUMENTATION CODES:   Obesity unspecified  INTERVENTION:   Recommend dobhoff tube placement and nutrition support if pt's oral intake remains poor   If tube able to be placed, recommend:  Osmolite 1.5@75ml /hr- Initiate at 25ml/hr and increase by 10ml/hr q 8 hours until goal rate is reached  ProSource TF 20- Give 60ml daily via tube, each supplement provides 80kcal and 20g of protein.   Free water  flushes 30ml q4 hours to maintain tube patency   Regimen provides 2780kcal/day, 133g/day protein and 1549ml/day of free water .   Discontinue Ensure Max   Add Nepro Shake po TID, each supplement provides 425 kcal and 19 grams protein  Add Magic cup TID with meals, each supplement provides 290 kcal and 9 grams of protein  Liberalize diet   Vitamin C 500mg  po BID   Zinc  220mg  po daily x 30 days   Rena-vit daily po daily   Pt remains at refeed risk; recommend monitor potassium, magnesium and phosphorus labs daily until stable  Daily weights   NUTRITION DIAGNOSIS:   Increased nutrient needs related to acute illness (CRRT) as evidenced by estimated needs. -ongoing   GOAL:   Patient will meet greater than or equal to 90% of their needs -not met   MONITOR:   Diet advancement, Labs, Weight trends, Skin, I & O's  ASSESSMENT:   78 y/o male with h/o MGUS, OSA, CKD IV, DJD, HTN, CHF, kidney stones and HLD who is admitted with CHF, volume overload, cardiogenic shock and AKI requiring CRRT initiation 11/14.  Met with pt in room today. Pt with poor appetite and oral intake since admission; pt eating <50% of all meals. Pt with many complaints today; pt reports that his eggs and oatmeal were cold, his milk was warm and his muffin was hard. Pt ate a few bites of egg, 100% of a milk and half of a muffin today. Pt is not drinking much of the Ensure as he reports that its too sweet. RD discussed with pt the importance of adequate nutrition needed to preserve lean  muscle. Per chart, pt is down ~53lbs since admit and is now down ~22lbs from his UBW. Pt does have increased muscle depletions on exam today. Pt would like to try butter pecan Nepro; RD will order. Pt with vitamin deficiencies; RD will add supplementation. RD discussed dobhoff tube placement and nutrition support with pt today. Pt would like to try and eat more first prior to placing a tube. Pt remains at high refeed risk. Plan is for HD again tomorrow.    Medications reviewed and include: epoetin, heparin , melatonin, midodrine , rena-vit, protonix   Labs reviewed: Na 132(L), K 5.1 wnl, BUN 24(H), creat 3.23(H), P 4.2 wnl, Mg 2.1 wnl Folate 8.0 wnl, B12 276 wnl, copper  100 wnl, B6 5.1 wnl, vitamin C 0.2(L), zinc  42(L)- 11/22 Wbc- 18.6(H), Hgb 7.1(L), Hct 22.0(L) Cbgs- 112, 119, 120 x 24 hrs  UOP-   Nutrition Focused Physical Exam:  Flowsheet Row Most Recent Value  Orbital Region No depletion  Upper Arm Region No depletion  Thoracic and Lumbar Region No depletion  Buccal Region No depletion  Temple Region No depletion  Clavicle Bone Region Mild depletion  Clavicle and Acromion Bone Region Mild depletion  Scapular Bone Region Moderate depletion  Dorsal Hand No depletion  Patellar Region No depletion  Anterior Thigh Region No depletion  Posterior Calf Region No depletion  Edema (RD Assessment) Mild  Hair Reviewed  Eyes Reviewed  Mouth Reviewed  Skin Reviewed  Nails Reviewed   Diet Order:   Diet Order             Diet heart healthy/carb modified Room service appropriate? Yes with Assist; Fluid consistency: Thin  Diet effective now                  EDUCATION NEEDS:   Not appropriate for education at this time  Skin:  Skin Assessment: Reviewed RN Assessment (blister L buttocks)  Last BM:  11/30- Type 3  Height:   Ht Readings from Last 1 Encounters:  03/16/24 5' 9.02 (1.753 m)    Weight:   Wt Readings from Last 1 Encounters:  03/26/24 119.8 kg    Ideal  Body Weight:  72.7 kg  BMI:  Body mass index is 38.98 kg/m.  Estimated Nutritional Needs:   Kcal:  2700-3000kcal/day  Protein:  >135g/day  Fluid:  1.8-2.1L/day  Augustin Shams MS, RD, LDN If unable to be reached, please send secure chat to RD inpatient available from 8:00a-4:00p daily

## 2024-03-27 ENCOUNTER — Encounter: Payer: Self-pay | Admitting: Vascular Surgery

## 2024-03-27 ENCOUNTER — Encounter: Admission: AD | Disposition: A | Payer: Self-pay | Source: Intra-hospital | Attending: Internal Medicine

## 2024-03-27 HISTORY — PX: DIALYSIS/PERMA CATHETER INSERTION: CATH118288

## 2024-03-27 LAB — CBC WITH DIFFERENTIAL/PLATELET
Abs Immature Granulocytes: 0.05 K/uL (ref 0.00–0.07)
Basophils Absolute: 0.1 K/uL (ref 0.0–0.1)
Basophils Relative: 1 %
Eosinophils Absolute: 0.2 K/uL (ref 0.0–0.5)
Eosinophils Relative: 2 %
HCT: 21.9 % — ABNORMAL LOW (ref 39.0–52.0)
Hemoglobin: 6.9 g/dL — ABNORMAL LOW (ref 13.0–17.0)
Immature Granulocytes: 1 %
Lymphocytes Relative: 11 %
Lymphs Abs: 1.1 K/uL (ref 0.7–4.0)
MCH: 32.5 pg (ref 26.0–34.0)
MCHC: 31.5 g/dL (ref 30.0–36.0)
MCV: 103.3 fL — ABNORMAL HIGH (ref 80.0–100.0)
Monocytes Absolute: 0.4 K/uL (ref 0.1–1.0)
Monocytes Relative: 4 %
Neutro Abs: 8.3 K/uL — ABNORMAL HIGH (ref 1.7–7.7)
Neutrophils Relative %: 81 %
Platelets: 151 K/uL (ref 150–400)
RBC: 2.12 MIL/uL — ABNORMAL LOW (ref 4.22–5.81)
RDW: 14.5 % (ref 11.5–15.5)
WBC: 10.2 K/uL (ref 4.0–10.5)
nRBC: 0 % (ref 0.0–0.2)

## 2024-03-27 LAB — BASIC METABOLIC PANEL WITH GFR
Anion gap: 9 (ref 5–15)
BUN: 37 mg/dL — ABNORMAL HIGH (ref 8–23)
CO2: 26 mmol/L (ref 22–32)
Calcium: 9.2 mg/dL (ref 8.9–10.3)
Chloride: 98 mmol/L (ref 98–111)
Creatinine, Ser: 4.64 mg/dL — ABNORMAL HIGH (ref 0.61–1.24)
GFR, Estimated: 12 mL/min — ABNORMAL LOW (ref >60)
Glucose, Bld: 92 mg/dL (ref 70–99)
Potassium: 5.4 mmol/L — ABNORMAL HIGH (ref 3.5–5.1)
Sodium: 133 mmol/L — ABNORMAL LOW (ref 135–145)

## 2024-03-27 LAB — PHOSPHORUS: Phosphorus: 5.7 mg/dL — ABNORMAL HIGH (ref 2.5–4.6)

## 2024-03-27 LAB — GLUCOSE, CAPILLARY
Glucose-Capillary: 107 mg/dL — ABNORMAL HIGH (ref 70–99)
Glucose-Capillary: 138 mg/dL — ABNORMAL HIGH (ref 70–99)
Glucose-Capillary: 88 mg/dL (ref 70–99)
Glucose-Capillary: 90 mg/dL (ref 70–99)
Glucose-Capillary: 92 mg/dL (ref 70–99)
Glucose-Capillary: 93 mg/dL (ref 70–99)

## 2024-03-27 LAB — HEMOGLOBIN AND HEMATOCRIT, BLOOD
HCT: 24.5 % — ABNORMAL LOW (ref 39.0–52.0)
Hemoglobin: 7.6 g/dL — ABNORMAL LOW (ref 13.0–17.0)

## 2024-03-27 LAB — MAGNESIUM: Magnesium: 2.3 mg/dL (ref 1.7–2.4)

## 2024-03-27 LAB — ABO/RH: ABO/RH(D): O POS

## 2024-03-27 LAB — PREPARE RBC (CROSSMATCH)

## 2024-03-27 SURGERY — DIALYSIS/PERMA CATHETER INSERTION
Anesthesia: Moderate Sedation

## 2024-03-27 MED ORDER — HEPARIN SODIUM (PORCINE) 10000 UNIT/ML IJ SOLN
INTRAMUSCULAR | Status: DC | PRN
  Administered 2024-03-27: 10000 [IU]

## 2024-03-27 MED ORDER — ALTEPLASE 2 MG IJ SOLR: 2.0000 mg | Freq: Once | INTRAMUSCULAR | Status: DC | PRN

## 2024-03-27 MED ORDER — HEPARIN SODIUM (PORCINE) 1000 UNIT/ML IJ SOLN
INTRAMUSCULAR | Status: AC
  Filled 2024-03-27: qty 4

## 2024-03-27 MED ORDER — FENTANYL CITRATE (PF) 50 MCG/ML IJ SOSY
PREFILLED_SYRINGE | INTRAMUSCULAR | Status: AC
  Filled 2024-03-27: qty 1

## 2024-03-27 MED ORDER — MIDODRINE HCL 5 MG PO TABS
10.0000 mg | ORAL_TABLET | ORAL | Status: DC | PRN
  Administered 2024-03-28 – 2024-04-05 (×5): 10 mg via ORAL
  Filled 2024-03-27: qty 2

## 2024-03-27 MED ORDER — HEPARIN SODIUM (PORCINE) 1000 UNIT/ML DIALYSIS: 1000.0000 [IU] | INTRAMUSCULAR | Status: DC | PRN

## 2024-03-27 MED ORDER — SODIUM CHLORIDE 0.9 % IV SOLN: INTRAVENOUS | Status: DC

## 2024-03-27 MED ORDER — LIDOCAINE-EPINEPHRINE (PF) 1 %-1:200000 IJ SOLN
INTRAMUSCULAR | Status: DC | PRN
  Administered 2024-03-27: 20 mL via INTRADERMAL

## 2024-03-27 MED ORDER — HEPARIN SODIUM (PORCINE) 1000 UNIT/ML DIALYSIS
1000.0000 [IU] | INTRAMUSCULAR | Status: DC | PRN
  Administered 2024-03-28: 4200 [IU]

## 2024-03-27 MED ORDER — SODIUM CHLORIDE 0.9% IV SOLUTION: Freq: Once | INTRAVENOUS | Status: DC

## 2024-03-27 MED ORDER — HEPARIN SODIUM (PORCINE) 10000 UNIT/ML IJ SOLN
INTRAMUSCULAR | Status: AC
  Filled 2024-03-27: qty 1

## 2024-03-27 MED ORDER — CEFAZOLIN SODIUM-DEXTROSE 1-4 GM/50ML-% IV SOLN
1.0000 g | INTRAVENOUS | Status: AC
  Administered 2024-03-27: 1 g via INTRAVENOUS

## 2024-03-27 MED ORDER — HEPARIN (PORCINE) IN NACL 1000-0.9 UT/500ML-% IV SOLN
INTRAVENOUS | Status: DC | PRN
  Administered 2024-03-27: 500 mL

## 2024-03-27 MED ORDER — CEFAZOLIN SODIUM-DEXTROSE 1-4 GM/50ML-% IV SOLN
INTRAVENOUS | Status: AC
  Filled 2024-03-27: qty 50

## 2024-03-27 MED ORDER — MIDAZOLAM HCL 2 MG/2ML IJ SOLN
INTRAMUSCULAR | Status: AC
  Filled 2024-03-27: qty 2

## 2024-03-27 SURGICAL SUPPLY — 7 items
BIOPATCH RED 1 DISK 7.0 (GAUZE/BANDAGES/DRESSINGS) IMPLANT
CATH HEMO 15FR 19 SMART SEAL (HEMODIALYSIS SUPPLIES) IMPLANT
COVER PROBE ULTRASOUND 5X96 (MISCELLANEOUS) IMPLANT
DERMABOND ADVANCED .7 DNX12 (GAUZE/BANDAGES/DRESSINGS) IMPLANT
PACK ANGIOGRAPHY (CUSTOM PROCEDURE TRAY) ×1 IMPLANT
SUT MNCRL AB 4-0 PS2 18 (SUTURE) IMPLANT
SUT PROLENE 0 CT 1 30 (SUTURE) IMPLANT

## 2024-03-27 NOTE — Progress Notes (Deleted)
  Cardiology Office Note   Date:  03/27/2024  ID:  Dave Brown, DOB [birth year], MRN [id number] PCP: Debrah Josette ORN., PA-C  [facility] Providers Cardiologist:  Newman JINNY Lawrence, MD { Click to update primary MD,subspecialty MD or APP then REFRESH:1}    History of Present Illness Dave Brown is a 78 y.o. male with history of hypertension, hyperlipidemia, obesity, CKD stage IV, MGUS, OSA, and HFpEF.     He was seen in establish care with Dr. Lawrence 02/23/2022 for heart failure. With his history of MGUS, amyloidosis was ruled out.  Echo 03/14/2022 shows LVEF 55 to 60%, moderate LVH, and no RWMA. Heart failure GDMT initiated. He was hospitalized 02/01/2024 for acute hypoxic HFpEF. He improved with diuresis and creatine returned to baseline. Hospitalized again 03/06/2024 for HFpEF and respiratory failure.  ROS: ***  Studies Reviewed      *** Risk Assessment/Calculations {Does this patient have ATRIAL FIBRILLATION?:[phone]}         Physical Exam VS:  There were no vitals taken for this visit.       Wt Readings from Last 3 Encounters:  03/27/24 270 lb 11.6 oz (122.8 kg)  03/08/24 (!) 308 lb 3.3 oz (139.8 kg)  02/05/24 287 lb 4.2 oz (130.3 kg)    GEN: Well nourished, well developed in no acute distress NECK: No JVD; No carotid bruits CARDIAC: ***RRR, no murmurs, rubs, gallops RESPIRATORY:  Clear to auscultation without rales, wheezing or rhonchi  ABDOMEN: Soft, non-tender, non-distended EXTREMITIES:  No edema; No deformity   ASSESSMENT AND PLAN ***    {Are you ordering a CV Procedure (e.g. stress test, cath, DCCV, TEE, etc)?   Press F2        :[id number]}  Dispo: ***  Signed, Mardy KATHEE Pizza, FNP

## 2024-03-27 NOTE — Op Note (Addendum)
 OPERATIVE NOTE    PRE-OPERATIVE DIAGNOSIS: 1. ESRD   POST-OPERATIVE DIAGNOSIS: same as above  PROCEDURE: Ultrasound guidance for vascular access to the right internal jugular vein Fluoroscopic guidance for placement of catheter Placement of a 19 cm tip to cuff tunneled hemodialysis catheter via the right internal jugular vein  SURGEON: Selinda Gu, MD  ANESTHESIA:  Local   ESTIMATED BLOOD LOSS: 10 cc  FLUORO TIME: less than one minute  CONTRAST: none  FINDING(S): 1.  Patent right internal jugular vein  SPECIMEN(S):  None  INDICATIONS:   Dave Brown is a 78 y.o.male who presents with renal failure.  The patient needs long term dialysis access for their ESRD, and a Permcath is necessary.  Risks and benefits are discussed and informed consent is obtained.    DESCRIPTION: After obtaining full informed written consent, the patient was brought back to the vascular suited. The patient's right neck and chest were sterilely prepped and draped in a sterile surgical field was created. The right internal jugular vein was visualized with ultrasound and found to be patent. It was then accessed under direct ultrasound guidance and a permanent image was recorded. A wire was placed. After skin nick and dilatation, the peel-away sheath was placed over the wire. I then turned my attention to an area under the clavicle. Approximately 1-2 fingerbreadths below the clavicle a small counterincision was created and tunneled from the subclavicular incision to the access site. Using fluoroscopic guidance, a 19 centimeter tip to cuff tunneled hemodialysis catheter was selected, and tunneled from the subclavicular incision to the access site. It was then placed through the peel-away sheath and the peel-away sheath was removed. Using fluoroscopic guidance the catheter tips were parked in the right atrium. The appropriate distal connectors were placed. It withdrew blood well and flushed easily with heparinized  saline and a concentrated heparin  solution was then placed. It was secured to the chest wall with 2 Prolene sutures. The access incision was closed single 4-0 Monocryl. A 4-0 Monocryl pursestring suture was placed around the exit site. Sterile dressings were placed. The patient tolerated the procedure well and was taken to the recovery room in stable condition.  COMPLICATIONS: None  CONDITION: Stable  Selinda Gu, MD 03/27/2024 9:34 AM   This note was created with Dragon Medical transcription system. Any errors in dictation are purely unintentional.

## 2024-03-27 NOTE — Plan of Care (Signed)
  Problem: Education: Goal: Knowledge of General Education information will improve Description: Including pain rating scale, medication(s)/side effects and non-pharmacologic comfort measures Outcome: Progressing   Problem: Health Behavior/Discharge Planning: Goal: Ability to manage health-related needs will improve Outcome: Progressing   Problem: Clinical Measurements: Goal: Ability to maintain clinical measurements within normal limits will improve Outcome: Progressing Goal: Diagnostic test results will improve Outcome: Progressing Goal: Cardiovascular complication will be avoided Outcome: Progressing   Problem: Activity: Goal: Risk for activity intolerance will decrease Outcome: Progressing   Problem: Nutrition: Goal: Adequate nutrition will be maintained Outcome: Progressing   Problem: Coping: Goal: Level of anxiety will decrease Outcome: Progressing   Problem: Skin Integrity: Goal: Risk for impaired skin integrity will decrease Outcome: Progressing   Problem: Health Behavior/Discharge Planning: Goal: Ability to manage health-related needs will improve Outcome: Progressing   Problem: Activity: Goal: Activity intolerance will improve Outcome: Progressing   Problem: Nutritional: Goal: Ability to make appropriate dietary choices will improve Outcome: Progressing   Problem: Respiratory: Goal: Respiratory symptoms related to disease process will be avoided Outcome: Progressing   Problem: Urinary Elimination: Goal: Progression of disease will be identified and treated Outcome: Progressing

## 2024-03-27 NOTE — Progress Notes (Signed)
 NAME:  Dave Brown, MRN:  [id number], DOB:  July 19, 1945, LOS: 19 ADMISSION DATE:  03/08/2024, CONSULTATION DATE:  03/08/2024  Brief Pt Description / Synopsis:  78 y.o. male admitted with Acute on Chronic HFpEF, Cardiogenic/Septic shock, AKI, Acute Hypoxic Respiratory Failure, and MRSA Pneumonia requiring mechanical ventilation and initiation of CRRT.   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 He denied any fevers, chills, chest pain.     Of note, the patient was recently admitted to the hospital from 02/01/2024 to 02/05/2024 for respiratory failure due to CHF.  The patient did not require oxygen  at the time of his discharge.  His discharge weight was 286.  At his last office visit with his primary provider his weight was 291 pounds on 02/22/24.  Follow up with pulmonary on 03/04/24 office weight 29.   He was discharged home with furosemide  40 mg daily during last hospitalization   ED Course: Initial Vital Signs: afebrile and hemodynamically stable. Oxygen  saturation was 93-97% on 4 L  Significant Labs: WBC 11.0, hemoglobin 12.3, platelets 138. Sodium 141, potassium 4.1, bicarbonate 31, serum creatinine 4.15. proBNP 16,139. PCT 0.13. Lactic acid 1.9 >> 0.8. COVID-negative  Imaging Chest X-ray>>patchy opacities right lower lobe and left lower lobe  Medications Administered: furosemide  40 mg IV, Solu-Medrol  125 mg, ceftriaxone , and azithromycin . He was given albuterol  and Atrovent .    [facility] asked to admit for further workup and treatment.  He gradually became hypotensive requiring initiation of low dose Levophed  (3 to 5 mcg), PCCM was consulted.  Nephrology was consulted for oliguria. Case was  discussed with nephrology and it was felt the patient need to be initiated on CRRT. Subsequently, initial request was made to transfer the patient to [facility]. In order to expedite care, he is being transferred to [facility] regional ICU to critical care service where nephrology will be consulted for continued management of his renal failure and fluid overload.   Please see Significant Hospital Events section below for full detailed hospital course.  03/26/24- patient for permcath placement. He is eating on his own but still behind on calories. He is being moved to [facility] and meds are refined today.  03/27/24- patient s/p permcath today.  He is hypotensive and has severe anemia this am.  Mild electrolyte derrangements which will improve post HD. I met with daughter and we reviewed hospital course and plan with patient and family.    Pertinent  Medical History   Past Medical History:  Diagnosis Date   CHF (congestive heart failure) (HCC)    Degenerative joint disease    High cholesterol    Hypertension    Kidney stones    Obesity    Respiratory failure with hypoxia (HCC)    Sleep apnea     Micro Data:  11/12: Blood cultures x2>> no growth 11/13: MRSA PCR>> negative 11/13: RVP>> negative 11/24: MRSA PCR>> negative 11/18: Tracheal Aspirate>> MRSA 11/19: MRSA PCR +  Antimicrobials:   Anti-infectives (From admission, onward)    Start     Dose/Rate Route Frequency Ordered Stop   03/27/24 0812  ceFAZolin (ANCEF) IVPB 1 g/50 mL premix        1 g 100 mL/hr over 30  Minutes Intravenous 30 min pre-op 03/27/24 0812 03/27/24 0945   03/21/24 1015  linezolid  (ZYVOX ) IVPB 600 mg        600 mg 300 mL/hr over 60 Minutes Intravenous Every 12 hours 03/21/24 0918 03/25/24 2238   03/17/24 1300  vancomycin  (VANCOCIN ) IVPB 1000 mg/200 mL premix        1,000 mg 200 mL/hr over 60 Minutes Intravenous  Once 03/17/24 1147 03/17/24 1420   03/16/24 1200  vancomycin  (VANCOCIN ) IVPB 1000 mg/200 mL premix         1,000 mg 200 mL/hr over 60 Minutes Intravenous  Once 03/16/24 1104 03/16/24 2100   03/15/24 2200  piperacillin -tazobactam (ZOSYN ) IVPB 3.375 g        3.375 g 12.5 mL/hr over 240 Minutes Intravenous Every 8 hours 03/15/24 1412 03/17/24 0128   03/15/24 1600  vancomycin  (VANCOREADY) IVPB 1500 mg/300 mL        1,500 mg 150 mL/hr over 120 Minutes Intravenous  Once 03/15/24 1411 03/15/24 1705   03/15/24 1411  vancomycin  variable dose per unstable renal function (pharmacist dosing)  Status:  Discontinued         Does not apply See admin instructions 03/15/24 1411 03/19/24 1107   03/15/24 1400  vancomycin  (VANCOREADY) IVPB 1500 mg/300 mL  Status:  Discontinued        1,500 mg 150 mL/hr over 120 Minutes Intravenous Every 24 hours 03/14/24 1139 03/15/24 1411   03/14/24 1200  vancomycin  (VANCOREADY) IVPB 2000 mg/400 mL        2,000 mg 200 mL/hr over 120 Minutes Intravenous  Once 03/14/24 1012 03/14/24 1405   03/12/24 1200  piperacillin -tazobactam (ZOSYN ) IVPB 3.375 g  Status:  Discontinued        3.375 g 100 mL/hr over 30 Minutes Intravenous Every 6 hours 03/12/24 1013 03/15/24 1412        Significant Hospital Events: Including procedures, antibiotic start and stop dates in addition to other pertinent events   11/12: Admitted to [facility] for treatment of Acute Decompensated HFpEF and AKI. 11/13: BP decreased, started on Levophed .  PCCM consulted.  ABX discontinued. 11/14: Transfer to [facility] for initiation of CRRT.  11/15: seizure like episode, intubated 11/16: Unable to wean; PICC line placed 11/21: Extubated to Bipap. Added Dobutamine .  11/23: Patient remains on bipap this AM. WBC stable at 7.6. H&H Stable. Coox SvO2 67%.  Electrolytes within normal range. NE decreased to 4. Remains on Dobutamin at 5.  11/24: Off BiPAP, awake and alert but remains encephalopathic.  Afebrile, Remains on Levophed , currently 10 mcg, weaning as tolerated.  Creatinine increased to 2.9, but UOP 1.4 L  (net - 7.8L), Nephrology holding off on HD today. 11/25: No significant events overnight.  Midodrine  started overnight, now off Levophed .   Precedex  weaned off yesterday, remains encephalopathic but slowly improving. On 4L [place]. Creatinine worsened to 3.6, UOP 675 cc last 24 hrs (net - 8.4L), plan for HD today.  Plan for aggressive pulmonary toilet and mobilization as able.  11/26: Remains critically ill; desatted to the 80s overnight, placed on bipap and developed hypotension requiring initiation of levophed  gtt again. Hypercapnic overnight, will recheck vbg. Nephrology following, placed back on CRRT.  11/27: No acute events overnight, afebrile, remains on Levophed  and CRRT.  Worsening Leukocytosis, obtain Sputum culture and start Linezolid .  Weaned off BiPAP to [place] 11/28: remains on CRRT remains on pressors 12/01: Pt confused this am and refused to keep Bipap on.  Pt stable on 2L O2  via nasal canula no longer requiring vasopressors    Objective   Blood pressure (!) 89/50, pulse 81, temperature (!) 97 F (36.1 C), temperature source Temporal, resp. rate (!) 21, height 5' 9 (1.753 m), weight 122.8 kg, SpO2 96%.    FiO2 (%):  [35 %] 35 %  No intake or output data in the 24 hours ending 03/27/24 1026  Filed Weights   03/26/24 0500 03/27/24 0445 03/27/24 0837  Weight: 119.8 kg 122.8 kg 122.8 kg    Examination: General: Acute on chronically ill appearing obese male, laying in bed, NAD on 2L O2 via nasal canula  HENT: Atramatic, normocephalic, neck supple, difficult to assess JVD due to body habitus Lungs: Diminished throughout, even, non labored  Cardiovascular: NSR, s1s2, no m/r/g, 2+ radial/1+ distal pulses, trace edema  Abdomen: +BS x4, obese, soft, non tender, slightly distended and slightly taught Extremities: Generalized weakness, normal bulk and tone, moves all extremities  Neuro: Alert and following commands, disoriented to situation, PERRLA  GU: External male catheter in  place  Resolved Hospital Problem list     Assessment & Plan:   #Acute Metabolic Encephalopathy CT Head negative, EEG normal -Treatment of metabolic derangements as outlined above - Provide supportive care - Promote normal sleep/wake cycle and family presence - Avoid sedating medications as able - Scheduled melatonin and prn trazodone  for insomnia  - Mobilize as able PT/OT consulted   #Acute on Chronic HFpEF #Shock: Cardiogenic +/- Septic  #Paroxsymal A. Fib Echocardiogram 03/16/24: LVEF >75%, grade I DD, RV systolic function normal, RV size normal - Continuous telemetry monitoring - Maintain map >65: not requiring vasopressors at this time  - Continue midodrine  - Volume removal with HD - Cardiology following, appreciate input~plan for cardiac cath at some point - Initially on heparin  gtt, however discontinued due to mild hemoptysis/hematuria   #Acute Hypoxic Respiratory Failure  #Pulmonary Edema #MRSA Pneumonia~TREATED  Intubated 11/15; Extubated 11/21 - Supplemental O2 as needed to maintain O2 sats >92% - BiPAP qhs and prn  - Follow intermittent Chest X-ray & ABG as needed - Prn bronchodilator therapy  - ABX as above  - Volume removal with HD - Pulmonary toilet as able  #AKI on CKD - Trend BMP  - Monitor I&O's / urinary output - Ensure adequate renal perfusion - Avoid nephrotoxic agents as able - Replace electrolytes as indicated ~ Pharmacy following for assistance with electrolyte replacement - Nephrology following, appreciate input~HD per recommendations  - Will need permcath   #MRSA Pneumonia ~TREATED #Leukocytosis  - Trend WBC and monitor fever curve  -Follow cultures as above   #Type II Diabetes Mellitus - CBG's q4h; Target range of 140 to 180 - SSI - Follow ICU Hypo/Hyperglycemia protocol   Labs   CBC: Recent Labs  Lab 03/23/24 0356 03/24/24 0441 03/25/24 0451 03/26/24 0510 03/27/24 0427  WBC 8.3 9.0 9.2 18.6* 10.2  NEUTROABS  --   --   --   16.7* 8.3*  HGB 7.7* 7.6* 7.6* 7.1* 6.9*  HCT 24.5* 23.5* 24.0* 22.0* 21.9*  MCV 100.8* 100.0 101.3* 100.5* 103.3*  PLT 146* 160 159 144* 151    Basic Metabolic Panel: Recent Labs  Lab 03/23/24 0356 03/23/24 1522 03/24/24 0441 03/24/24 1532 03/25/24 0451 03/26/24 0510 03/27/24 0427  NA 134*   < > 135 135 138 132* 133*  K 4.7   < > 4.3 4.6 4.9 5.1 5.4*  CL 102   < > 101 101 103 99 98  CO2 25   < >  26 26 27 25 26   GLUCOSE 156*   < > 99 103* 107* 122* 92  BUN 22   < > 17 18 23  24* 37*  CREATININE 1.91*   < > 1.80* 2.13* 2.82* 3.23* 4.64*  CALCIUM  8.6*   < > 8.5* 8.8* 9.1 8.9 9.2  MG 2.3  --  2.3  --  2.3 2.1 2.3  PHOS 2.8   < > 3.0 3.6 3.8 4.2 5.7*   < > = values in this interval not displayed.   GFR: Estimated Creatinine Clearance: 17 mL/min (A) (by C-G formula based on SCr of 4.64 mg/dL (H)). Recent Labs  Lab 03/21/24 0907 03/22/24 0326 03/24/24 0441 03/25/24 0451 03/26/24 0510 03/27/24 0427  WBC  --    < > 9.0 9.2 18.6* 10.2  LATICACIDVEN 1.5  --   --   --   --   --    < > = values in this interval not displayed.    Liver Function Tests: Recent Labs  Lab 03/23/24 1522 03/24/24 0441 03/24/24 1532 03/25/24 0451 03/26/24 0510  ALBUMIN 3.0* 3.1* 3.1* 3.2* 3.1*   No results for input(s): LIPASE, AMYLASE in the last 168 hours. No results for input(s): AMMONIA in the last 168 hours.  ABG    Component Value Date/Time   PHART 7.41 03/16/2024 1412   PCO2ART 41 03/16/2024 1412   PO2ART 167 (H) 03/16/2024 1412   HCO3 25.4 03/20/2024 1210   ACIDBASEDEF 3.9 (H) 03/20/2024 1210   O2SAT 74.7 03/20/2024 1210     Coagulation Profile: No results for input(s): INR, PROTIME in the last 168 hours.  Cardiac Enzymes: No results for input(s): CKTOTAL, CKMB, CKMBINDEX, TROPONINI in the last 168 hours.  HbA1C: Hgb A1c MFr Bld  Date/Time Value Ref Range Status  03/08/2024 03:11 AM 5.2 4.8 - 5.6 % Final    Comment:    (NOTE) Diagnosis of  Diabetes The following HbA1c ranges recommended by the American Diabetes Association (ADA) may be used as an aid in the diagnosis of diabetes mellitus.  Hemoglobin             Suggested A1C NGSP%              Diagnosis  <5.7                   Non Diabetic  5.7-6.4                Pre-Diabetic  >6.4                   Diabetic  <7.0                   Glycemic control for                       adults with diabetes.    05/02/2020 02:37 PM 6.9 (H) 4.8 - 5.6 % Final    Comment:    (NOTE) Pre diabetes:          5.7%-6.4%  Diabetes:              >6.4%  Glycemic control for   <7.0% adults with diabetes     CBG: Recent Labs  Lab 03/26/24 1947 03/26/24 1954 03/26/24 2318 03/27/24 0306 03/27/24 0734  GLUCAP 65* 88 153* 90 92    Review of Systems:   Unable to assess due to AMS   Past Medical History:  He,  has  a past medical history of CHF (congestive heart failure) (HCC), Degenerative joint disease, High cholesterol, Hypertension, Kidney stones, Obesity, Respiratory failure with hypoxia (HCC), and Sleep apnea.   Surgical History:   Past Surgical History:  Procedure Laterality Date   DIALYSIS/PERMA CATHETER INSERTION N/A 03/27/2024   Procedure: DIALYSIS/PERMA CATHETER INSERTION;  Surgeon: Marea Selinda RAMAN, MD;  Location: [facility] INVASIVE CV LAB;  Service: Cardiovascular;  Laterality: N/A;     Social History:   reports that he quit smoking about 25 years ago. His smoking use included cigars. He does not have any smokeless tobacco history on file. He reports that he does not drink alcohol and does not use drugs.   Family History:  His family history is not on file.   Allergies No Known Allergies   Home Medications  Prior to Admission medications   Medication Sig Start Date End Date Taking? Authorizing Provider  albuterol  (PROVENTIL ) (2.5 MG/3ML) 0.083% nebulizer solution Take 2.5 mg by nebulization every 6 (six) hours as needed for shortness of breath or wheezing. 03/04/24   Yes [provider]  hydrocortisone  sodium succinate  (SOLU-CORTEF ) 100 MG injection Inject 2 mLs (100 mg total) into the vein every 8 (eight) hours. 03/08/24  Yes Tat, Alm, MD  midodrine  (PROAMATINE ) 10 MG tablet Take 1 tablet (10 mg total) by mouth 3 (three) times daily with meals. 03/08/24  Yes Tat, Alm, MD  norepinephrine  (LEVOPHED ) 4-5 MG/250ML-% SOLN Inject 0-10 mcg/min into the vein continuous. 03/08/24  Yes Tat, Alm, MD  rosuvastatin  (CRESTOR ) 10 MG tablet Take 10 mg by mouth daily.   Yes [provider]  sertraline  (ZOLOFT ) 25 MG tablet Take 25 mg by mouth in the morning.   Yes [provider]  TYLENOL  325 MG tablet Take 325-650 mg by mouth every 8 (eight) hours as needed for mild pain (pain score 1-3) (or headaches).   Yes [provider]     Critical care provider statement:   Total critical care time: 33 minutes   Performed by: Parris MD   Critical care time was exclusive of separately billable procedures and treating other patients.   Critical care was necessary to treat or prevent imminent or life-threatening deterioration.   Critical care was time spent personally by me on the following activities: development of treatment plan with patient and/or surrogate as well as nursing, discussions with consultants, evaluation of patient's response to treatment, examination of patient, obtaining history from patient or surrogate, ordering and performing treatments and interventions, ordering and review of laboratory studies, ordering and review of radiographic studies, pulse oximetry and re-evaluation of patient's condition.    Dave Brown, M.D.  Pulmonary & Critical Care Medicine

## 2024-03-27 NOTE — Progress Notes (Deleted)
   Subjective:    Patient ID: Dave Brown, male    DOB: 1946/03/14, 78 y.o.   MRN: [id number]  HPI    Review of Systems     Objective:   Physical Exam        Assessment & Plan:

## 2024-03-27 NOTE — Progress Notes (Signed)
 OT Cancellation Note  Patient Details Name: Dave Brown MRN: [id number] DOB: April 07, 1946   Cancelled Treatment:    Reason Eval/Treat Not Completed: Patient at procedure or test/ unavailable. Off unit this AM for permcath placement, off unit PM for HD. Will hold and continue to follow POC as pt available.  Elston Slot, M.S. OTR/L  03/27/24, 3:58 PM  ascom [phone]

## 2024-03-27 NOTE — Interval H&P Note (Signed)
 History and Physical Interval Note:  03/27/2024 8:58 AM  Dave Brown  has presented today for surgery, with the diagnosis of esrd.  The various methods of treatment have been discussed with the patient and family. After consideration of risks, benefits and other options for treatment, the patient has consented to  Procedure(s): DIALYSIS/PERMA CATHETER INSERTION (N/A) as a surgical intervention.  The patient's history has been reviewed, patient examined, no change in status, stable for surgery.  I have reviewed the patient's chart and labs.  Questions were answered to the patient's satisfaction.     Richelle Glick

## 2024-03-27 NOTE — Plan of Care (Signed)
  Problem: Clinical Measurements: Goal: Ability to maintain clinical measurements within normal limits will improve Outcome: Progressing Goal: Will remain free from infection Outcome: Progressing Goal: Diagnostic test results will improve Outcome: Progressing Goal: Respiratory complications will improve Outcome: Progressing   Problem: Pain Managment: Goal: General experience of comfort will improve and/or be controlled Outcome: Progressing   Problem: Safety: Goal: Ability to remain free from injury will improve Outcome: Progressing   Problem: Skin Integrity: Goal: Risk for impaired skin integrity will decrease Outcome: Progressing

## 2024-03-27 NOTE — Consult Note (Signed)
 PHARMACY CONSULT NOTE - ELECTROLYTES  Pharmacy Consult for Electrolyte Monitoring and Replacement   Recent Labs: Height: 5' 9 (175.3 cm) Weight: 122.8 kg (270 lb 11.6 oz) IBW/kg (Calculated) : 70.7 Estimated Creatinine Clearance: 17 mL/min (A) (by C-G formula based on SCr of 4.64 mg/dL (H)). Potassium  Date Value  03/27/2024 5.4 mmol/L (H)  07/18/2013 3.4 mEq/L (L)   Magnesium (mg/dL)  Date Value  87/96/7974 2.3   Calcium  (mg/dL)  Date Value  87/96/7974 9.2  07/18/2013 10.1   Albumin (g/dL)  Date Value  87/97/7974 3.1 (L)  07/18/2013 3.8   Phosphorus (mg/dL)  Date Value  87/96/7974 5.7 (H)   Sodium  Date Value  03/27/2024 133 mmol/L (L)  07/18/2013 138 mEq/L   Assessment  Dave Brown is a 78 y.o. male presenting with AKI requiring CRRT. PMH significant for HFpEF and COPD requiring 2L supplemental oxygen . Pharmacy has been consulted to monitor and replace electrolytes.  Goal of Therapy: Electrolytes WNL  Plan:  Labs notable for mild hyperkalemia. Patient is due for HD today Check labs tomorrow AM  Thank you for allowing pharmacy to be a part of this patient's care.  Marolyn KATHEE Mare 03/27/2024 11:51 AM

## 2024-03-27 NOTE — Progress Notes (Signed)
 PT Cancellation Note  Patient Details Name: Dave Brown MRN: [id number] DOB: August 18, 1945   Cancelled Treatment:    Reason Eval/Treat Not Completed: Patient at procedure or test/unavailable Pt out of room for dialysis, will maintain on caseload and attempt to see as appropriate.  Carmin JONELLE Deed, DPT 03/27/2024, 3:12 PM

## 2024-03-27 NOTE — Progress Notes (Signed)
 Per ICU attending Fuad Aleskerov,MD, ok to remove Temp Fem HD cath now that Permcath Access has been established.

## 2024-03-27 NOTE — Progress Notes (Signed)
  Received patient in bed to unit.   Informed consent signed and in chart.    TX duration: 11 min     Transported back to ICU Hand-off given to patient's nurse.    Access used: R HD Catheter  Access issues: poor push/pull from catheter.  Newly place permcath this AM.  Unable to dialyze today.  Multiple alarms and multiple times flushing catheter,  NP aware and will place orders for pt to dialyze tomorrow.   B/p soft  systolic 70's and 80's.  Pt rinsed back and b/p up to 108 systolic.    Total UF removed: added on to pt  Medication(s) given: none  Post HD weight: error on bed weight      Olivia Hurst LPN [facility]

## 2024-03-27 NOTE — Progress Notes (Signed)
 [facility] [place], [place] 03/27/24  Subjective:   Hospital day # 51 78 year old male with hypertension, heart failure with preserved ejection fraction, hyperlipidemia, stage IV CKD, obstructive sleep apnea, MGUS, chronic respiratory failure on 2 L oxygen  presented from [facility] with shortness of breath.  Transferred to [facility] for need of continuous dialysis.  Update:  Patient seen resting in bed Post procedure  Will receive dialysis later today  Objective:  Vital signs in last 24 hours:  Temp:  [96.9 F (36.1 C)-99 F (37.2 C)] 97 F (36.1 C) (12/03 0945) Pulse Rate:  [71-86] 81 (12/03 1300) Resp:  [15-30] 21 (12/03 1300) BP: (87-115)/(45-69) 101/60 (12/03 1300) SpO2:  [91 %-100 %] 91 % (12/03 1300) FiO2 (%):  [35 %] 35 % (12/03 0323) Weight:  [122.8 kg] 122.8 kg (12/03 0837)  Weight change: 3 kg Filed Weights   03/26/24 0500 03/27/24 0445 03/27/24 0837  Weight: 119.8 kg 122.8 kg 122.8 kg    Intake/Output:   No intake or output data in the 24 hours ending 03/27/24 1339    Physical Exam: General: Ill-appearing elderly  Pulm/lungs Scattered rhonchi, [place] O2  CVS/Heart S1S2 no rubs  Abdomen:  Distended, nontender  Extremities: 2+ bilateral lower extremity edema  Neurologic: Alert, hard of hearing  Skin: Warm, dry  Access: Left femoral temporary dialysis catheter, Rt internal jugular permcath       Basic Metabolic Panel:  Recent Labs  Lab 03/23/24 0356 03/23/24 1522 03/24/24 0441 03/24/24 1532 03/25/24 0451 03/26/24 0510 03/27/24 0427  NA 134*   < > 135 135 138 132* 133*  K 4.7   < > 4.3 4.6 4.9 5.1 5.4*  CL 102   < > 101 101 103 99 98  CO2 25   < > 26 26 27 25 26   GLUCOSE 156*   < > 99 103* 107* 122* 92  BUN 22   < > 17 18 23  24* 37*  CREATININE 1.91*   < > 1.80* 2.13* 2.82* 3.23* 4.64*  CALCIUM  8.6*   < > 8.5* 8.8* 9.1 8.9 9.2  MG 2.3  --  2.3  --  2.3 2.1 2.3  PHOS 2.8   < > 3.0 3.6 3.8 4.2 5.7*   < > = values in  this interval not displayed.     CBC: Recent Labs  Lab 03/23/24 0356 03/24/24 0441 03/25/24 0451 03/26/24 0510 03/27/24 0427  WBC 8.3 9.0 9.2 18.6* 10.2  NEUTROABS  --   --   --  16.7* 8.3*  HGB 7.7* 7.6* 7.6* 7.1* 6.9*  HCT 24.5* 23.5* 24.0* 22.0* 21.9*  MCV 100.8* 100.0 101.3* 100.5* 103.3*  PLT 146* 160 159 144* 151      Lab Results  Component Value Date   HEPBSAG NON REACTIVE 03/16/2024      Microbiology:  Recent Results (from the past 240 hours)  Expectorated Sputum Assessment w Gram Stain, Rflx to Resp Cult     Status: None   Collection Time: 03/21/24  5:20 PM   Specimen: Throat; Sputum  Result Value Ref Range Status   Specimen Description THROAT  Final   Special Requests NONE  Final   Sputum evaluation   Final    Sputum specimen not acceptable for testing.  Please recollect.   RECOLLECT [address]ITA [address] 03/21/2024 [facility] Performed at [facility], [address], [place], [place] [address]    Report Status 03/21/2024 FINAL  Final    Coagulation Studies: No results  for input(s): LABPROT, INR in the last 72 hours.   Urinalysis: No results for input(s): COLORURINE, LABSPEC, PHURINE, GLUCOSEU, HGBUR, BILIRUBINUR, KETONESUR, PROTEINUR, UROBILINOGEN, NITRITE, LEUKOCYTESUR in the last 72 hours.  Invalid input(s): APPERANCEUR    Imaging: PERIPHERAL VASCULAR CATHETERIZATION Result Date: 03/27/2024 See surgical note for result.       Medications:    anticoagulant sodium citrate       sodium chloride    Intravenous Once   vitamin C  500 mg Oral BID   Chlorhexidine  Gluconate Cloth  6 each Topical Q0600   epoetin alfa-epbx (RETACRIT) injection  20,000 Units Subcutaneous Weekly   feeding supplement (NEPRO CARB STEADY)  237 mL Oral TID BM   heparin  injection (subcutaneous)  5,000 Units Subcutaneous Q8H   melatonin  2.5 mg Oral QHS   midodrine   10 mg Oral TID WC   multivitamin  1 tablet Oral QHS   mouth rinse  15 mL  Mouth Rinse 4 times per day   pantoprazole  (PROTONIX ) IV  40 mg Intravenous QHS   sertraline   25 mg Oral Daily   zinc  sulfate (50mg  elemental zinc )  220 mg Oral Daily   alteplase , anticoagulant sodium citrate , docusate sodium , guaiFENesin -dextromethorphan , heparin , ipratropium-albuterol , ondansetron  (ZOFRAN ) IV, mouth rinse, phenol, polyethylene glycol, sodium chloride  flush, traZODone   Assessment/ Plan:  78 y.o. 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  Baseline creatinine of 2.5-2.8. Acute kidney injury is thought to be secondary to pneumonia and circulatory shock causing ATN.  Patient responded well to IV Lasix  03/13/24.  However due to low CVP, further doses were held. CRRT stopped 11/22. First HD 11/23.  Update:  Scheduled for dialysis later today. Will monitor blood pressure and assess need for additional blood pressure support. May consider additional treatment tomorrow.   Lab Results  Component Value Date   CREATININE 4.64 (H) 03/27/2024   CREATININE 3.23 (H) 03/26/2024   CREATININE 2.82 (H) 03/25/2024    No intake or output data in the 24 hours ending 03/27/24 1339    Acute respiratory failure Respiratory pathogen's panel by PCR is negative.  Blood cultures from 03/06/2024 are negative. Uses CPAP at night.  Currently on 5 L nasal cannula.  Anemia of CKD.  Hemoglobin down to 7.1.  No immediate need for transfusion but consider if hemoglobin drops to 7 or less.  Continue retacrit 20000 units subcutaneous weekly.    LOS: [address] Faith Harris 12/3/20251:39 PM  [address]  [place], [place] [phone]

## 2024-03-28 ENCOUNTER — Ambulatory Visit: Attending: General Practice | Admitting: General Practice

## 2024-03-28 DIAGNOSIS — N189 Chronic kidney disease, unspecified: Secondary | ICD-10-CM

## 2024-03-28 DIAGNOSIS — I5A Non-ischemic myocardial injury (non-traumatic): Secondary | ICD-10-CM | POA: Insufficient documentation

## 2024-03-28 DIAGNOSIS — Z4901 Encounter for fitting and adjustment of extracorporeal dialysis catheter: Secondary | ICD-10-CM

## 2024-03-28 DIAGNOSIS — D631 Anemia in chronic kidney disease: Secondary | ICD-10-CM

## 2024-03-28 DIAGNOSIS — J15212 Pneumonia due to Methicillin resistant Staphylococcus aureus: Secondary | ICD-10-CM

## 2024-03-28 DIAGNOSIS — R579 Shock, unspecified: Secondary | ICD-10-CM

## 2024-03-28 LAB — BASIC METABOLIC PANEL WITH GFR
Anion gap: 10 (ref 5–15)
BUN: 47 mg/dL — ABNORMAL HIGH (ref 8–23)
CO2: 26 mmol/L (ref 22–32)
Calcium: 9 mg/dL (ref 8.9–10.3)
Chloride: 98 mmol/L (ref 98–111)
Creatinine, Ser: 5.12 mg/dL — ABNORMAL HIGH (ref 0.61–1.24)
GFR, Estimated: 11 mL/min — ABNORMAL LOW (ref >60)
Glucose, Bld: 89 mg/dL (ref 70–99)
Potassium: 5.1 mmol/L (ref 3.5–5.1)
Sodium: 134 mmol/L — ABNORMAL LOW (ref 135–145)

## 2024-03-28 LAB — CBC WITH DIFFERENTIAL/PLATELET
Abs Immature Granulocytes: 0.05 K/uL (ref 0.00–0.07)
Basophils Absolute: 0 K/uL (ref 0.0–0.1)
Basophils Relative: 1 %
Eosinophils Absolute: 0.1 K/uL (ref 0.0–0.5)
Eosinophils Relative: 2 %
HCT: 23.5 % — ABNORMAL LOW (ref 39.0–52.0)
Hemoglobin: 7.5 g/dL — ABNORMAL LOW (ref 13.0–17.0)
Immature Granulocytes: 1 %
Lymphocytes Relative: 12 %
Lymphs Abs: 0.9 K/uL (ref 0.7–4.0)
MCH: 32.2 pg (ref 26.0–34.0)
MCHC: 31.9 g/dL (ref 30.0–36.0)
MCV: 100.9 fL — ABNORMAL HIGH (ref 80.0–100.0)
Monocytes Absolute: 0.5 K/uL (ref 0.1–1.0)
Monocytes Relative: 6 %
Neutro Abs: 5.7 K/uL (ref 1.7–7.7)
Neutrophils Relative %: 78 %
Platelets: 150 K/uL (ref 150–400)
RBC: 2.33 MIL/uL — ABNORMAL LOW (ref 4.22–5.81)
RDW: 15.5 % (ref 11.5–15.5)
WBC: 7.2 K/uL (ref 4.0–10.5)
nRBC: 0 % (ref 0.0–0.2)

## 2024-03-28 LAB — BPAM RBC
Blood Product Expiration Date: 202601042359
ISSUE DATE / TIME: 202512031659
Unit Type and Rh: 5100

## 2024-03-28 LAB — TYPE AND SCREEN
ABO/RH(D): O POS
Antibody Screen: NEGATIVE
Unit division: 0

## 2024-03-28 LAB — GLUCOSE, CAPILLARY
Glucose-Capillary: 80 mg/dL (ref 70–99)
Glucose-Capillary: 88 mg/dL (ref 70–99)
Glucose-Capillary: 95 mg/dL (ref 70–99)

## 2024-03-28 LAB — PHOSPHORUS: Phosphorus: 6 mg/dL — ABNORMAL HIGH (ref 2.5–4.6)

## 2024-03-28 LAB — MAGNESIUM: Magnesium: 2.3 mg/dL (ref 1.7–2.4)

## 2024-03-28 MED ORDER — SALINE SPRAY 0.65 % NA SOLN
1.0000 | NASAL | Status: DC | PRN
  Administered 2024-03-28: 1 via NASAL
  Filled 2024-03-28: qty 44

## 2024-03-28 MED ORDER — HEPARIN SODIUM (PORCINE) 1000 UNIT/ML IJ SOLN
INTRAMUSCULAR | Status: AC
  Filled 2024-03-28: qty 5

## 2024-03-28 NOTE — Assessment & Plan Note (Signed)
 Mental status improved likely secondary to uremia

## 2024-03-28 NOTE — Progress Notes (Signed)
 Occupational Therapy Treatment Patient Details Name: Dave Brown MRN: [id number] DOB: 09/24/1945 Today's Date: 03/28/2024   History of present illness Dave Brown is a 78 y.o male with a past medical history significant for  HFpEF,, hypertension, hyperlipidemia, CKD stage IV, OSA, MGUS (12/21/2011), chronic respiratory failure on 2 L presented to [facility] on 03/06/24 with shortness of breath x 1 day along with mild increase in his lower extremity edema and orthopnea. 03/08/24 transferred to [facility] for initiation of CRRT. 03/09/24 seizure like episode, intubated. 03/15/24 extubated.   OT comments  Dave Brown was seen for OT treatment on this date. Upon arrival to room pt in bed, agreeable to tx. Pt requires  MAX A don B socks sitting EOB. MIN A x2 + HHA sit<>stand x2 and ICU bed>standard bed pivot t/f. Tolerated >15 min seated therex as described below. Pt making good progress toward goals, will continue to follow POC. Discharge recommendation remains appropriate.        If plan is discharge home, recommend the following:  A lot of help with walking and/or transfers;A lot of help with bathing/dressing/bathroom   Equipment Recommendations  BSC/3in1    Recommendations for Other Services      Precautions / Restrictions Precautions Precautions: Fall Recall of Precautions/Restrictions: Impaired Restrictions Weight Bearing Restrictions Per Provider Order: No       Mobility Bed Mobility Overal bed mobility: Needs Assistance Bed Mobility: Supine to Sit     Supine to sit: Min assist, +2 for physical assistance          Transfers Overall transfer level: Needs assistance Equipment used: 2 person hand held assist Transfers: Sit to/from Stand, Bed to chair/wheelchair/BSC Sit to Stand: Min assist, +2 physical assistance     Step pivot transfers: Min assist, +2 physical assistance           Balance Overall balance assessment: Needs assistance Sitting-balance support: No upper  extremity supported, Feet supported Sitting balance-Leahy Scale: Fair     Standing balance support: Bilateral upper extremity supported Standing balance-Leahy Scale: Poor Standing balance comment: external support required to maintain standing balance                           ADL either performed or assessed with clinical judgement   ADL Overall ADL's : Needs assistance/impaired                                       General ADL Comments: MAX A don B socks sitting EOB. MIN A x2 + HHA for simulated BSC t/f     Communication Communication Communication: Impaired Factors Affecting Communication: Hearing impaired   Cognition Arousal: Alert Behavior During Therapy: WFL for tasks assessed/performed Cognition: Difficult to assess Difficult to assess due to: Hard of hearing/deaf                             Following commands: Impaired Following commands impaired: Follows one step commands with increased time      Cueing   Cueing Techniques: Verbal cues, Tactile cues  Exercises Exercises: General Upper Extremity General Exercises - Upper Extremity Shoulder Flexion: AROM, Both, 10 reps, Seated Shoulder Extension: AROM, Both, 10 reps, Seated Shoulder ABduction: AROM, Both, 10 reps, Seated Shoulder ADduction: AROM, Both, 10 reps, Seated Shoulder Horizontal ABduction: AROM, Both, 10 reps, Seated Shoulder  Horizontal ADduction: AROM, Both, 10 reps, Seated Elbow Flexion: AROM, Both, 10 reps, Seated Elbow Extension: AROM, Both, 10 reps, Seated       General Comments MAP 68 while sitting. Sp02 in the 90's on 4 L02    Pertinent Vitals/ Pain       Pain Assessment Pain Assessment: No/denies pain   Frequency  Min 2X/week        Progress Toward Goals  OT Goals(current goals can now be found in the care plan section)  Progress towards OT goals: Progressing toward goals  Acute Rehab OT Goals OT Goal Formulation: With patient Time For  Goal Achievement: 03/31/24 Potential to Achieve Goals: Good ADL Goals Pt Will Perform Grooming: with supervision;standing Pt Will Perform Lower Body Dressing: with supervision;sit to/from stand Pt Will Transfer to Toilet: with modified independence;ambulating;regular height toilet  Plan      Co-evaluation    PT/OT/SLP Co-Evaluation/Treatment: Yes Reason for Co-Treatment: For patient/therapist safety;To address functional/ADL transfers PT goals addressed during session: Mobility/safety with mobility OT goals addressed during session: ADL's and self-care      AM-PAC OT 6 Clicks Daily Activity     Outcome Measure   Help from another person eating meals?: None Help from another person taking care of personal grooming?: A Little Help from another person toileting, which includes using toliet, bedpan, or urinal?: A Lot Help from another person bathing (including washing, rinsing, drying)?: A Lot Help from another person to put on and taking off regular upper body clothing?: A Little Help from another person to put on and taking off regular lower body clothing?: A Lot 6 Click Score: 16    End of Session    OT Visit Diagnosis: Other abnormalities of gait and mobility (R26.89);Muscle weakness (generalized) (M62.81)   Activity Tolerance Patient tolerated treatment well   Patient Left in bed;with nursing/sitter in room   Nurse Communication Mobility status        Time: 8678-8645 OT Time Calculation (min): 33 min  Charges: OT General Charges $OT Visit: 1 Visit OT Treatments $Therapeutic Exercise: 8-22 mins  Elston Slot, M.S. OTR/L  03/28/24, 4:35 PM  ascom [phone]

## 2024-03-28 NOTE — Assessment & Plan Note (Addendum)
 Anemia of chronic disease.  Hemoglobin 8.3

## 2024-03-28 NOTE — Progress Notes (Signed)
 Progress Note   Patient: Dave Brown FMW:[id number] DOB: Feb 02, 1946 DOA: 03/08/2024     20 DOS: the patient was seen and examined on 03/28/2024   Brief hospital course: 78 y.o male with a past medical history significant for  HFpEF,, hypertension, hyperlipidemia, CKD stage IV, OSA, MGUS (12/21/2011), chronic respiratory failure on 2 L presented to [facility] on 03/06/24 with shortness of breath x 1 day along with mild increase in his lower extremity edema and orthopnea. He denied any fevers, chills, chest pain.     Of note, the patient was recently admitted to the hospital from 02/01/2024 to 02/05/2024 for respiratory failure due to CHF.  The patient did not require oxygen  at the time of his discharge.  His discharge weight was 286.  At his last office visit with his primary provider his weight was 291 pounds on 02/22/24.  Follow up with pulmonary on 03/04/24 office weight 29.   He was discharged home with furosemide  40 mg daily during last hospitalization.  11/12: Admitted to [facility] to [place] for treatment of Acute Decompensated HFpEF and AKI. 11/13: BP decreased, started on Levophed .  PCCM consulted.  ABX discontinued. 11/14: Transfer to [facility] for initiation of CRRT.  11/15: seizure like episode, intubated 11/16: Unable to wean; PICC line placed 11/21: Extubated to Bipap. Added Dobutamine .  11/23: Patient remains on bipap this AM. WBC stable at 7.6. H&H Stable. [place] SvO2 67%.  Electrolytes within normal range. NE decreased to 4. Remains on Dobutamin at 5.  11/24: Off BiPAP, awake and alert but remains encephalopathic.  Afebrile, Remains on Levophed , currently 10 mcg, weaning as tolerated.  Creatinine increased to 2.9, but UOP 1.4 L (net - 7.8L), Nephrology holding off on HD today. 11/25: No significant events overnight.  Midodrine  started overnight, now off Levophed .   Precedex  weaned off yesterday, remains encephalopathic but slowly improving. On 4L [place]. Creatinine worsened to 3.6, UOP 675 cc last  24 hrs (net - 8.4L), plan for HD today.  Plan for aggressive pulmonary toilet and mobilization as able.  11/26: Remains critically ill; desatted to the 80s overnight, placed on bipap and developed hypotension requiring initiation of levophed  gtt again. Hypercapnic overnight, will recheck vbg. Nephrology following, placed back on CRRT.  11/27: No acute events overnight, afebrile, remains on Levophed  and CRRT.  Worsening Leukocytosis, obtain Sputum culture and start Linezolid .  Weaned off BiPAP to [place] 11/28: remains on CRRT remains on pressors 12/01: Pt confused this am and refused to keep Bipap on.  Pt stable on 2L O2 via nasal canula no longer requiring vasopressors  03/26/24- patient for permcath placement. He is eating on his own but still behind on calories. He is being moved to [facility] and meds are refined today.  03/27/24- patient s/p permcath today.  He is hypotensive and has severe anemia this am.  Mild electrolyte derrangements which will improve post HD.  12/4.  Medical team taking over.  Patient tolerating dialysis with fluid removal.  Will transfer out of ICU.  Assessment and Plan: * Acute heart failure with preserved ejection fraction (HFpEF) (HCC) EF greater than 75%, dialysis to help out with fluid management.  Acute kidney injury superimposed on CKD AKI on CKD stage IV.  Patient required continuous dialysis while in the ICU and now on hemodialysis.  Able to do some fluid removal today.  PermCath placed on 12/3.  Acute on chronic respiratory failure with hypoxia and hypercapnia (HCC) Patient on nasal cannula oxygen  and BiPAP at night.  Patient  was intubated on 11/15 and extubated on 11/21.  Toxic metabolic encephalopathy Mental status improved likely secondary to uremia  PAF (paroxysmal atrial fibrillation) (HCC) Patient not on any blood thinners or rate controlling medications at this point.  Patient did have hemoptysis and hematuria and anemia.  [facility]) Required pressors  during the hospital course.  Now on midodrine  prior to dialysis.  MRSA pneumonia (HCC) Treated during the hospital course  Anemia due to chronic kidney disease Anemia of chronic disease.  Hemoglobin 7.5.  May end up needing a transfusion.  Myocardial injury Seen by cardiology early on the hospital course was on heparin  but stopped secondary to hemoptysis and hematuria.  Cardiology will need to follow-up as outpatient.  Blood pressure too low for cardiac meds.        Subjective: Patient feels okay.  Seen while on dialysis in the ICU.  Offers no complaints.  No shortness of breath.  No leg swelling.  Initially admitted with shortness of breath and on treatment for acute on chronic diastolic heart failure.  Physical Exam: Vitals:   03/28/24 1115 03/28/24 1130 03/28/24 1142 03/28/24 1145  BP: (!) 109/56 (!) 107/57 (!) 102/53 (!) 93/52  Pulse: 72 74 74 74  Resp: (!) 24 (!) 22 (!) 25 (!) 24  Temp:    98.1 F (36.7 C)  TempSrc:      SpO2: 100% 100% 100% 100%  Weight:    120.4 kg  Height:       Physical Exam HENT:     Head: Normocephalic.     Mouth/Throat:     Pharynx: No oropharyngeal exudate.  Eyes:     General: Lids are normal.     Conjunctiva/sclera: Conjunctivae normal.  Cardiovascular:     Rate and Rhythm: Normal rate and regular rhythm.     Heart sounds: Normal heart sounds, S1 normal and S2 normal.  Pulmonary:     Breath sounds: Examination of the right-lower field reveals decreased breath sounds. Examination of the left-lower field reveals decreased breath sounds. Decreased breath sounds present. No wheezing, rhonchi or rales.  Abdominal:     Palpations: Abdomen is soft.     Tenderness: There is no abdominal tenderness.  Musculoskeletal:     Right lower leg: No swelling.     Left lower leg: No swelling.  Skin:    General: Skin is warm.     Findings: No rash.  Neurological:     Mental Status: He is alert.     Data Reviewed: Sodium 134, potassium 5.1,  creatinine 5.12, GFR 11, white blood cell count 7.2, hemoglobin 7.5, platelet count 150  Family Communication: Updated patient's son on the phone  Disposition: Status is: Inpatient Remains inpatient appropriate because: Transfer out of ICU today.  See how he does with PT and OT.  First dialysis session today where he is able to tolerate fluid pulled off.  Planned Discharge Destination: Rehab    Time spent: 28 minutes  Author: Charlie Patterson, MD 03/28/2024 12:10 PM  For on call review [email or web address].

## 2024-03-28 NOTE — Assessment & Plan Note (Signed)
Treated during the hospital course 

## 2024-03-28 NOTE — Assessment & Plan Note (Signed)
 Required pressors during the hospital course.  Now on midodrine  prior to dialysis.

## 2024-03-28 NOTE — Assessment & Plan Note (Addendum)
 AKI on CKD stage IV.  Patient required continuous dialysis while in the ICU and now on hemodialysis.  PermCath placed on 12/3.  Nephrology to look into outpatient dialysis units.

## 2024-03-28 NOTE — Progress Notes (Signed)
 Physical Therapy Treatment Patient Details Name: Dave Brown MRN: [id number] DOB: 22-Sep-1945 Today's Date: 03/28/2024   History of Present Illness Dave Brown is a 78 y.o male with a past medical history significant for  HFpEF,, hypertension, hyperlipidemia, CKD stage IV, OSA, MGUS (12/21/2011), chronic respiratory failure on 2 L presented to [facility] on 03/06/24 with shortness of breath x 1 day along with mild increase in his lower extremity edema and orthopnea. 03/08/24 transferred to [facility] for initiation of CRRT. 03/09/24 seizure like episode, intubated. 03/15/24 extubated.    PT Comments  Patient is agreeable to PT session. He continues to require assistance for mobility. Two standing bouts performed. Standing tolerance limited for progression of ambulation. PT will continue to follow to maximize independence. Rehabilitation < 3 hours/day recommended after this hospital stay.    If plan is discharge home, recommend the following: A lot of help with walking and/or transfers;A lot of help with bathing/dressing/bathroom;Assist for transportation;Supervision due to cognitive status;Help with stairs or ramp for entrance;Assistance with cooking/housework   Can travel by private vehicle     No  Equipment Recommendations  None recommended by PT    Recommendations for Other Services       Precautions / Restrictions Precautions Precautions: Fall Recall of Precautions/Restrictions: Impaired Restrictions Weight Bearing Restrictions Per Provider Order: No     Mobility  Bed Mobility Overal bed mobility: Needs Assistance       Supine to sit: Min assist, +2 for physical assistance Sit to supine: Min assist, +2 for physical assistance   General bed mobility comments: cues for technique, initiation    Transfers Overall transfer level: Needs assistance Equipment used: 2 person hand held assist Transfers: Sit to/from Stand, Bed to chair/wheelchair/BSC Sit to Stand: Min assist, +2 physical  assistance   Step pivot transfers: Min assist, +2 physical assistance       General transfer comment: 2 standing bouts performed. cues for technique    Ambulation/Gait             Pre-gait activities: standing tolerance less than 2 minutes     Stairs             Wheelchair Mobility     Tilt Bed    Modified Rankin (Stroke Patients Only)       Balance Overall balance assessment: Needs assistance Sitting-balance support: No upper extremity supported, Feet supported Sitting balance-Leahy Scale: Fair     Standing balance support: Bilateral upper extremity supported Standing balance-Leahy Scale: Poor Standing balance comment: external support required to maintain standing balance                            Communication Communication Communication: Impaired Factors Affecting Communication: Hearing impaired  Cognition Arousal: Alert Behavior During Therapy: WFL for tasks assessed/performed   PT - Cognitive impairments: Memory, Sequencing, Initiation Difficult to assess due to: Hard of hearing/deaf                       Following commands: Impaired Following commands impaired: Follows one step commands with increased time    Cueing Cueing Techniques: Verbal cues, Tactile cues  Exercises General Exercises - Lower Extremity Long Arc Quad: AROM, Strengthening, Both, 10 reps, Seated Hip Flexion/Marching: AROM, Strengthening, Both, 5 reps, Seated    General Comments General comments (skin integrity, edema, etc.): MAP 68 while sitting. Sp02 in the 90's on 4 L02      Pertinent Vitals/Pain Pain  Assessment Pain Assessment: No/denies pain    Home Living                          Prior Function            PT Goals (current goals can now be found in the care plan section) Acute Rehab PT Goals Patient Stated Goal: to get better PT Goal Formulation: With patient Time For Goal Achievement: 04/01/24 Potential to Achieve  Goals: Good Progress towards PT goals: Progressing toward goals    Frequency    Min 2X/week      PT Plan      Co-evaluation PT/OT/SLP Co-Evaluation/Treatment: Yes Reason for Co-Treatment: For patient/therapist safety;To address functional/ADL transfers PT goals addressed during session: Mobility/safety with mobility OT goals addressed during session: ADL's and self-care      AM-PAC PT 6 Clicks Mobility   Outcome Measure  Help needed turning from your back to your side while in a flat bed without using bedrails?: A Lot Help needed moving from lying on your back to sitting on the side of a flat bed without using bedrails?: A Lot Help needed moving to and from a bed to a chair (including a wheelchair)?: A Lot Help needed standing up from a chair using your arms (e.g., wheelchair or bedside chair)?: A Lot Help needed to walk in hospital room?: Total Help needed climbing 3-5 steps with a railing? : Total 6 Click Score: 10    End of Session Equipment Utilized During Treatment: Oxygen  Activity Tolerance: Patient tolerated treatment well Patient left: in bed;with call bell/phone within reach;with bed alarm set Nurse Communication: Mobility status PT Visit Diagnosis: Muscle weakness (generalized) (M62.81);Unsteadiness on feet (R26.81)     Time: 8678-8644 PT Time Calculation (min) (ACUTE ONLY): 34 min  Charges:    $Therapeutic Activity: 8-22 mins PT General Charges $$ ACUTE PT VISIT: 1 Visit                     Dave Brown, PT, MPT    Dave Brown 03/28/2024, 2:45 PM

## 2024-03-28 NOTE — Assessment & Plan Note (Signed)
 Patient not on any blood thinners or rate controlling medications at this point.  Patient did have hemoptysis and hematuria and anemia.

## 2024-03-28 NOTE — Hospital Course (Addendum)
 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 He denied any fevers, chills, chest pain.     Of note, the patient was recently admitted to the hospital from 02/01/2024 to 02/05/2024 for respiratory failure due to CHF.  The patient did not require oxygen  at the time of his discharge.  His discharge weight was 286.  At his last office visit with his primary provider his weight was 291 pounds on 02/22/24.  Follow up with pulmonary on 03/04/24 office weight 29.   He was discharged home with furosemide  40 mg daily during last hospitalization.  11/12: Admitted to [facility] to [place] for treatment of Acute Decompensated HFpEF and AKI. 11/13: BP decreased, started on Levophed .  PCCM consulted.  ABX discontinued. 11/14: Transfer to [facility] for initiation of CRRT.  11/15: seizure like episode, intubated 11/16: Unable to wean; PICC line placed 11/21: Extubated to Bipap. Added Dobutamine .  11/23: Patient remains on bipap this AM. WBC stable at 7.6. H&H Stable. Coox SvO2 67%.  Electrolytes within normal range. NE decreased to 4. Remains on Dobutamin at 5.  11/24: Off BiPAP, awake and alert but remains encephalopathic.  Afebrile, Remains on Levophed , currently 10 mcg, weaning as tolerated.  Creatinine increased to 2.9, but UOP 1.4 L (net - 7.8L), Nephrology holding off on HD today. 11/25: No significant events overnight.  Midodrine  started overnight, now off Levophed .   Precedex  weaned off yesterday, remains encephalopathic but slowly improving. On 4L [place]. Creatinine worsened to 3.6, UOP 675 cc last 24 hrs (net - 8.4L), plan for HD today.  Plan for aggressive pulmonary toilet and mobilization as able.  11/26: Remains critically ill; desatted to the 80s overnight, placed  on bipap and developed hypotension requiring initiation of levophed  gtt again. Hypercapnic overnight, will recheck vbg. Nephrology following, placed back on CRRT.  11/27: No acute events overnight, afebrile, remains on Levophed  and CRRT.  Worsening Leukocytosis, obtain Sputum culture and start Linezolid .  Weaned off BiPAP to [place] 11/28: remains on CRRT remains on pressors 12/01: Pt confused this am and refused to keep Bipap on.  Pt stable on 2L O2 via nasal canula no longer requiring vasopressors  03/26/24- patient for permcath placement. He is eating on his own but still behind on calories. He is being moved to [facility] and meds are refined today.  03/27/24- patient s/p permcath today.  He is hypotensive and has severe anemia this am.  Mild electrolyte derrangements which will improve post HD.  12/4.  Medical team taking over.  Patient tolerating dialysis with fluid removal.  Will transfer out of ICU. 12/6.  pCO2 58 on blood gas this morning.  Will be a candidate for noninvasive ventilation at home.  Dialysis today.  Will need outpatient dialysis. 12/7.  Feels okay. 12/9.  Patient seen on dialysis.  Ready to get out of the hospital.  BiPAP to be approved.  Has home oxygen  already.  Awaiting for outpatient dialysis slot to be financially approved.

## 2024-03-28 NOTE — Progress Notes (Signed)
 Progress Note    03/28/2024 12:51 PM 1 Day Post-Op  Subjective:  Dave Brown is a 78 yo male now POD #1 from   PROCEDURE: Ultrasound guidance for vascular access to the right internal jugular vein Fluoroscopic guidance for placement of catheter Placement of a 19 cm tip to cuff tunneled hemodialysis catheter via the right internal jugular vein   SURGEON: Selinda Gu, MD   ANESTHESIA:  Local    ESTIMATED BLOOD LOSS: 10 cc   FLUORO TIME: less than one minute   CONTRAST: none   FINDING(S): 1.  Patent right internal jugular vein  Patient resting comfortably in bed this morning.  Hemodialysis is being done at the bedside in ICU this morning.  Dialysis permacatheter access is working well.  No complications overnight.  Vitals are remained stable.  Vitals:   03/28/24 1215 03/28/24 1235  BP: (!) 103/57 (!) 102/53  Pulse: 76 83  Resp: (!) 21 (!) 21  Temp:    SpO2: 100% 97%   Physical Exam: Cardiac:  RRR, normal S1 and S2.  No murmurs appreciated. Lungs: Slightly labored breathing, clear on auscultation but diminished in the bases.  No rales rhonchi or wheezing noted. Incisions: Right chest tunneled dialysis catheter placement.  Dressing clean dry and intact.  No hematoma seroma noted. Extremities: All extremities are warm to touch with palpable pulses.  Noted +1 edema to bilateral lower extremities. Abdomen: Positive bowel sounds throughout, obese, soft, nontender nondistended. Neurologic: Alert and oriented x 3 but very hard of hearing.  Answers questions and follows commands when you speak loudly to him.  CBC    Component Value Date/Time   WBC 7.2 03/28/2024 0520   RBC 2.33 (L) 03/28/2024 0520   HGB 7.5 (L) 03/28/2024 0520   HGB 14.7 04/13/2022 1334   HGB 14.9 07/18/2013 0935   HCT 23.5 (L) 03/28/2024 0520   HCT 44.5 07/18/2013 0935   PLT 150 03/28/2024 0520   PLT 147 (L) 04/13/2022 1334   PLT 145 07/18/2013 0935   MCV 100.9 (H) 03/28/2024 0520   MCV 91.7 07/18/2013  0935   MCH 32.2 03/28/2024 0520   MCHC 31.9 03/28/2024 0520   RDW 15.5 03/28/2024 0520   RDW 15.5 (H) 07/18/2013 0935   LYMPHSABS 0.9 03/28/2024 0520   LYMPHSABS 2.9 07/18/2013 0935   MONOABS 0.5 03/28/2024 0520   MONOABS 0.8 07/18/2013 0935   EOSABS 0.1 03/28/2024 0520   EOSABS 0.3 07/18/2013 0935   BASOSABS 0.0 03/28/2024 0520   BASOSABS 0.0 07/18/2013 0935    BMET    Component Value Date/Time   NA 134 (L) 03/28/2024 0520   NA 138 07/18/2013 0935   K 5.1 03/28/2024 0520   K 3.4 (L) 07/18/2013 0935   CL 98 03/28/2024 0520   CL 104 06/22/2012 0901   CO2 26 03/28/2024 0520   CO2 20 (L) 07/18/2013 0935   GLUCOSE 89 03/28/2024 0520   GLUCOSE 169 (H) 07/18/2013 0935   GLUCOSE 107 (H) 06/22/2012 0901   BUN 47 (H) 03/28/2024 0520   BUN 35.9 (H) 07/18/2013 0935   CREATININE 5.12 (H) 03/28/2024 0520   CREATININE 2.86 (H) 04/13/2022 1334   CREATININE 2.0 (H) 07/18/2013 0935   CALCIUM  9.0 03/28/2024 0520   CALCIUM  10.1 07/18/2013 0935   GFRNONAA 11 (L) 03/28/2024 0520   GFRNONAA 22 (L) 04/13/2022 1334    INR    Component Value Date/Time   INR 1.0 03/08/2024 1932     Intake/Output Summary (Last 24 hours) at  03/28/2024 1251 Last data filed at 03/28/2024 1145 Gross per 24 hour  Intake 281.75 ml  Output 1900 ml  Net -1618.25 ml     Assessment/Plan:  78 y.o. male is s/p SEE ABOVE  1 Day Post-Op   Plan Patient now post tunneled dialysis permacatheter placement to right chest.  Okay to use for hemodialysis.  No complications to note. Vascular surgery will sign off at this time.  DVT prophylaxis: Heparin  with dialysis.   [name] [facility] 03/28/2024 12:51 PM

## 2024-03-28 NOTE — Assessment & Plan Note (Signed)
 Seen by cardiology early on the hospital course was on heparin  but stopped secondary to hemoptysis and hematuria.  Cardiology will need to follow-up as outpatient.  Blood pressure too low for cardiac meds.

## 2024-03-28 NOTE — Assessment & Plan Note (Signed)
 Patient on nasal cannula oxygen  and BiPAP at night.  Patient was intubated on 11/15 and extubated on 11/21.  Blood gas yesterday shows pCO2 of 58.  Patient is a candidate for noninvasive ventilation and this will decrease hospitalizations.

## 2024-03-28 NOTE — Assessment & Plan Note (Addendum)
 EF greater than 75%, dialysis to help out with fluid management.  Blood pressure too low for other cardiac meds.

## 2024-03-28 NOTE — Progress Notes (Signed)
 [facility], [place] 03/28/24  Subjective:   Hospital day # 76 78 year old male with hypertension, heart failure with preserved ejection fraction, hyperlipidemia, stage IV CKD, obstructive sleep apnea, MGUS, chronic respiratory failure on 2 L oxygen  presented from [facility] with shortness of breath.  Transferred to [facility] for need of continuous dialysis.  Update:  Patient seen and evaluated during dialysis   HEMODIALYSIS FLOWSHEET:  Blood Flow Rate (mL/min): 200 mL/min Arterial Pressure (mmHg): -95.95 mmHg Venous Pressure (mmHg): 41.61 mmHg TMP (mmHg): -6.66 mmHg Ultrafiltration Rate (mL/min): 690 mL/min Dialysate Flow Rate (mL/min): 299 ml/min Dialysis Fluid Bolus: Normal Saline Bolus Amount (mL): 300 mL  Tolerating dialysis treatment well  Objective:  Vital signs in last 24 hours:  Temp:  [97.4 F (36.3 C)-98.5 F (36.9 C)] 98.1 F (36.7 C) (12/04 1145) Pulse Rate:  [65-85] 83 (12/04 1235) Resp:  [16-28] 21 (12/04 1235) BP: (90-126)/(45-67) 102/53 (12/04 1235) SpO2:  [91 %-100 %] 97 % (12/04 1235) FiO2 (%):  [35 %] 35 % (12/03 2300) Weight:  [119.4 kg-122 kg] 120.4 kg (12/04 1145)  Weight change: 0 kg Filed Weights   03/28/24 0500 03/28/24 0745 03/28/24 1145  Weight: 119.4 kg 122 kg 120.4 kg    Intake/Output:    Intake/Output Summary (Last 24 hours) at 03/28/2024 1256 Last data filed at 03/28/2024 1145 Gross per 24 hour  Intake 281.75 ml  Output 1900 ml  Net -1618.25 ml      Physical Exam: General: Ill-appearing elderly  Pulm/lungs Scattered rhonchi, [place] O2  CVS/Heart S1S2 no rubs  Abdomen:  Distended, nontender  Extremities: 2+ bilateral lower extremity edema  Neurologic: Alert, hard of hearing  Skin: Warm, dry  Access: Left femoral temporary dialysis catheter, Rt internal jugular permcath       Basic Metabolic Panel:  Recent Labs  Lab 03/24/24 0441 03/24/24 1532 03/25/24 0451 03/26/24 0510 03/27/24 0427  03/28/24 0520  NA 135 135 138 132* 133* 134*  K 4.3 4.6 4.9 5.1 5.4* 5.1  CL 101 101 103 99 98 98  CO2 26 26 27 25 26 26   GLUCOSE 99 103* 107* 122* 92 89  BUN 17 18 23  24* 37* 47*  CREATININE 1.80* 2.13* 2.82* 3.23* 4.64* 5.12*  CALCIUM  8.5* 8.8* 9.1 8.9 9.2 9.0  MG 2.3  --  2.3 2.1 2.3 2.3  PHOS 3.0 3.6 3.8 4.2 5.7* 6.0*     CBC: Recent Labs  Lab 03/24/24 0441 03/25/24 0451 03/26/24 0510 03/27/24 0427 03/27/24 2056 03/28/24 0520  WBC 9.0 9.2 18.6* 10.2  --  7.2  NEUTROABS  --   --  16.7* 8.3*  --  5.7  HGB 7.6* 7.6* 7.1* 6.9* 7.6* 7.5*  HCT 23.5* 24.0* 22.0* 21.9* 24.5* 23.5*  MCV 100.0 101.3* 100.5* 103.3*  --  100.9*  PLT 160 159 144* 151  --  150      Lab Results  Component Value Date   HEPBSAG NON REACTIVE 03/16/2024      Microbiology:  Recent Results (from the past 240 hours)  Expectorated Sputum Assessment w Gram Stain, Rflx to Resp Cult     Status: None   Collection Time: 03/21/24  5:20 PM   Specimen: Throat; Sputum  Result Value Ref Range Status   Specimen Description THROAT  Final   Special Requests NONE  Final   Sputum evaluation   Final    Sputum specimen not acceptable for testing.  Please recollect.   RECOLLECT [facility] [id number] 03/21/2024 [place]  Performed at [facility], [address] [place], [place] [address]    Report Status 03/21/2024 FINAL  Final    Coagulation Studies: No results for input(s): LABPROT, INR in the last 72 hours.   Urinalysis: No results for input(s): COLORURINE, LABSPEC, PHURINE, GLUCOSEU, HGBUR, BILIRUBINUR, KETONESUR, PROTEINUR, UROBILINOGEN, NITRITE, LEUKOCYTESUR in the last 72 hours.  Invalid input(s): APPERANCEUR    Imaging: PERIPHERAL VASCULAR CATHETERIZATION Result Date: 03/27/2024 See surgical note for result.       Medications:      vitamin C  500 mg Oral BID   Chlorhexidine  Gluconate Cloth  6 each Topical Q0600   epoetin alfa-epbx (RETACRIT) injection   20,000 Units Subcutaneous Weekly   feeding supplement (NEPRO CARB STEADY)  237 mL Oral TID BM   heparin  injection (subcutaneous)  5,000 Units Subcutaneous Q8H   melatonin  2.5 mg Oral QHS   midodrine   10 mg Oral TID WC   multivitamin  1 tablet Oral QHS   mouth rinse  15 mL Mouth Rinse 4 times per day   sertraline   25 mg Oral Daily   zinc  sulfate (50mg  elemental zinc )  220 mg Oral Daily   alteplase , docusate sodium , guaiFENesin -dextromethorphan , heparin , ipratropium-albuterol , midodrine , ondansetron  (ZOFRAN ) IV, mouth rinse, phenol, polyethylene glycol, sodium chloride  flush, traZODone   Assessment/ Plan:  78 y.o. 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  Baseline creatinine of 2.5-2.8. Acute kidney injury is thought to be secondary to pneumonia and circulatory shock causing ATN.  Patient responded well to IV Lasix  03/13/24.  However due to low CVP, further doses were held. CRRT stopped 11/22. First HD 11/23.  Update:  Unable to complete dialysis treatment due to multiple alarms related to recently placed access. Also experienced hypotension. Dialysis treatment stopped. Receiving treatment today, access functioning well with treatment. Patient tolerating UF today with Midodrine  10mg  given at start of treatment. Next treatment scheduled for Saturday.   Lab Results  Component Value Date   CREATININE 5.12 (H) 03/28/2024   CREATININE 4.64 (H) 03/27/2024   CREATININE 3.23 (H) 03/26/2024     Intake/Output Summary (Last 24 hours) at 03/28/2024 1256 Last data filed at 03/28/2024 1145 Gross per 24 hour  Intake 281.75 ml   Output 1900 ml  Net -1618.25 ml      Acute respiratory failure Respiratory pathogen's panel by PCR is negative.  Blood cultures from 03/06/2024 are negative. Uses CPAP at night.  Weaning per protocol, 4 L nasal cannula.  Anemia of CKD.  Hemoglobin 7.5. Continue retacrit 20000 units subcutaneous weekly.    LOS: 20 Dave Brown 12/4/202512:56 PM  [facility], [place] [phone]

## 2024-03-29 DIAGNOSIS — R531 Weakness: Secondary | ICD-10-CM

## 2024-03-29 LAB — CBC WITH DIFFERENTIAL/PLATELET
Abs Immature Granulocytes: 0.04 K/uL (ref 0.00–0.07)
Basophils Absolute: 0 K/uL (ref 0.0–0.1)
Basophils Relative: 1 %
Eosinophils Absolute: 0.1 K/uL (ref 0.0–0.5)
Eosinophils Relative: 2 %
HCT: 23.4 % — ABNORMAL LOW (ref 39.0–52.0)
Hemoglobin: 7.5 g/dL — ABNORMAL LOW (ref 13.0–17.0)
Immature Granulocytes: 1 %
Lymphocytes Relative: 17 %
Lymphs Abs: 1 K/uL (ref 0.7–4.0)
MCH: 32.2 pg (ref 26.0–34.0)
MCHC: 32.1 g/dL (ref 30.0–36.0)
MCV: 100.4 fL — ABNORMAL HIGH (ref 80.0–100.0)
Monocytes Absolute: 0.6 K/uL (ref 0.1–1.0)
Monocytes Relative: 9 %
Neutro Abs: 4.2 K/uL (ref 1.7–7.7)
Neutrophils Relative %: 70 %
Platelets: 151 K/uL (ref 150–400)
RBC: 2.33 MIL/uL — ABNORMAL LOW (ref 4.22–5.81)
RDW: 15.7 % — ABNORMAL HIGH (ref 11.5–15.5)
WBC: 6 K/uL (ref 4.0–10.5)
nRBC: 0 % (ref 0.0–0.2)

## 2024-03-29 LAB — GLUCOSE, CAPILLARY
Glucose-Capillary: 116 mg/dL — ABNORMAL HIGH (ref 70–99)
Glucose-Capillary: 103 mg/dL — ABNORMAL HIGH (ref 70–99)

## 2024-03-29 LAB — BASIC METABOLIC PANEL WITH GFR
Anion gap: 8 (ref 5–15)
BUN: 33 mg/dL — ABNORMAL HIGH (ref 8–23)
CO2: 30 mmol/L (ref 22–32)
Calcium: 8.6 mg/dL — ABNORMAL LOW (ref 8.9–10.3)
Chloride: 96 mmol/L — ABNORMAL LOW (ref 98–111)
Creatinine, Ser: 3.79 mg/dL — ABNORMAL HIGH (ref 0.61–1.24)
GFR, Estimated: 16 mL/min — ABNORMAL LOW (ref >60)
Glucose, Bld: 93 mg/dL (ref 70–99)
Potassium: 4.2 mmol/L (ref 3.5–5.1)
Sodium: 133 mmol/L — ABNORMAL LOW (ref 135–145)

## 2024-03-29 LAB — BLOOD GAS, ARTERIAL
Acid-Base Excess: 6.3 mmol/L — ABNORMAL HIGH (ref 0.0–2.0)
Bicarbonate: 32.8 mmol/L — ABNORMAL HIGH (ref 20.0–28.0)
O2 Content: 4 L/min
O2 Saturation: 98.7 %
Patient temperature: 37
pCO2 arterial: 58 mmHg — ABNORMAL HIGH (ref 32–48)
pH, Arterial: 7.36 (ref 7.35–7.45)
pO2, Arterial: 149 mmHg — ABNORMAL HIGH (ref 83–108)

## 2024-03-29 LAB — PHOSPHORUS: Phosphorus: 3.9 mg/dL (ref 2.5–4.6)

## 2024-03-29 NOTE — Progress Notes (Signed)
 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 He denied any fevers, chills, chest pain.     Of note, the patient was recently admitted to the hospital from 02/01/2024 to 02/05/2024 for respiratory failure due to CHF.  The patient did not require oxygen  at the time of his discharge.  His discharge weight was 286.  At his last office visit with his primary provider his weight was 291 pounds on 02/22/24.  Follow up with pulmonary on 03/04/24 office weight 29.   He was discharged home with furosemide  40 mg daily during last hospitalization.  11/12: Admitted to [facility] to [place] for treatment of Acute Decompensated HFpEF and AKI. 11/13: BP decreased, started on Levophed .  PCCM consulted.  ABX discontinued. 11/14: Transfer to [facility] for initiation of CRRT.  11/15: seizure like episode, intubated 11/16: Unable to wean; PICC line placed 11/21: Extubated to Bipap. Added Dobutamine .  11/23: Patient remains on bipap this AM. WBC stable at 7.6. H&H Stable. [place]ox SvO2 67%.  Electrolytes within normal range. NE decreased to 4. Remains on Dobutamin at 5.  11/24: Off BiPAP, awake and alert but remains encephalopathic.  Afebrile, Remains on Levophed , currently 10 mcg, weaning as tolerated.  Creatinine increased to 2.9, but UOP 1.4 L (net - 7.8L), Nephrology holding off on HD today. 11/25: No significant events overnight.  Midodrine  started overnight, now off Levophed .   Precedex  weaned off yesterday, remains encephalopathic but slowly improving. On 4L [place]. Creatinine worsened to 3.6, UOP 675 cc last  24 hrs (net - 8.4L), plan for HD today.  Plan for aggressive pulmonary toilet and mobilization as able.  11/26: Remains critically ill; desatted to the 80s overnight, placed on bipap and developed hypotension requiring initiation of levophed  gtt again. Hypercapnic overnight, will recheck vbg. Nephrology following, placed back on CRRT.  11/27: No acute events overnight, afebrile, remains on Levophed  and CRRT.  Worsening Leukocytosis, obtain Sputum culture and start Linezolid .  Weaned off BiPAP to [place] 11/28: remains on CRRT remains on pressors 12/01: Pt confused this am and refused to keep Bipap on.  Pt stable on 2L O2 via nasal canula no longer requiring vasopressors  03/26/24- patient for permcath placement. He is eating on his own but still behind on calories. He is being moved to [facility] and meds are refined today.  03/27/24- patient s/p permcath today.  He is hypotensive and has severe anemia this am.  Mild electrolyte derrangements which will improve post HD.  12/4.  Medical team taking over.  Patient tolerating dialysis with fluid removal.  Will transfer out of ICU.  Assessment and Plan: * Acute heart failure with preserved ejection fraction (HFpEF) (HCC) EF greater than 75%, dialysis to help out with fluid management.  Blood pressure too low for other cardiac meds.  Acute kidney injury superimposed on CKD AKI on CKD stage IV.  Patient required continuous dialysis while in the ICU and now on hemodialysis.  Able to do some fluid removal yesterday.  PermCath placed on 12/3.  Acute on chronic respiratory failure with hypoxia and hypercapnia (HCC) Patient on  nasal cannula oxygen  and BiPAP at night.  Patient was intubated on 11/15 and extubated on 11/21.  Toxic metabolic encephalopathy Mental status improved likely secondary to uremia  PAF (paroxysmal atrial fibrillation) (HCC) Patient not on any blood thinners or rate controlling medications at this point.  Patient did have hemoptysis and hematuria  and anemia.  Shock [facility]) Required pressors during the hospital course.  Now on midodrine  prior to dialysis.  MRSA pneumonia (HCC) Treated during the hospital course  Anemia due to chronic kidney disease Anemia of chronic disease.  Hemoglobin 7.5.  May end up needing a transfusion.  Generalized weakness Physical therapy recommending rehab  Myocardial injury Seen by cardiology early on the hospital course was on heparin  but stopped secondary to hemoptysis and hematuria.  Cardiology will need to follow-up as outpatient.  Blood pressure too low for cardiac meds.        Subjective: Patient feeling much better.  Wanting to get out of the hospital soon as possible.  I told him he needs to work on his strength.  Physical therapy recommending rehab.  Physical Exam: Vitals:   03/28/24 2112 03/29/24 0434 03/29/24 0500 03/29/24 0807  BP: (!) 105/57 (!) 113/59  (!) 106/59  Pulse: 74 66  72  Resp: 16 15  17   Temp: 98.1 F (36.7 C) 98 F (36.7 C)  97.6 F (36.4 C)  TempSrc:      SpO2: 100% 100%  100%  Weight:   117.9 kg   Height:       Physical Exam HENT:     Head: Normocephalic.     Mouth/Throat:     Pharynx: No oropharyngeal exudate.  Eyes:     General: Lids are normal.     Conjunctiva/sclera: Conjunctivae normal.  Cardiovascular:     Rate and Rhythm: Normal rate and regular rhythm.     Heart sounds: Normal heart sounds, S1 normal and S2 normal.  Pulmonary:     Breath sounds: Examination of the right-lower field reveals decreased breath sounds. Examination of the left-lower field reveals decreased breath sounds. Decreased breath sounds present. No wheezing, rhonchi or rales.  Abdominal:     Palpations: Abdomen is soft.     Tenderness: There is no abdominal tenderness.  Musculoskeletal:     Right lower leg: No swelling.     Left lower leg: No swelling.  Skin:    General: Skin is warm.     Findings: No rash.  Neurological:     Mental Status: He is alert.     Comments:  Patient able to straight leg raise bilaterally     Data Reviewed: Sodium 133, potassium 4.2, creatinine 3.79, GFR 16, white blood cell count 6.0, hemoglobin 7.5, platelet count 151  Family Communication:   Disposition: Status is: Inpatient Remains inpatient appropriate because: Physical therapy recommending rehab.  Will need outpatient dialysis.  Planned Discharge Destination: Rehab    Time spent: 28 minutes  Author: Charlie Patterson, MD 03/29/2024 3:38 PM  For on call review [email or web address].

## 2024-03-29 NOTE — Plan of Care (Signed)
  Problem: Education: Goal: Knowledge of General Education information will improve Description: Including pain rating scale, medication(s)/side effects and non-pharmacologic comfort measures Outcome: Progressing   Problem: Health Behavior/Discharge Planning: Goal: Ability to manage health-related needs will improve Outcome: Progressing   Problem: Clinical Measurements: Goal: Ability to maintain clinical measurements within normal limits will improve Outcome: Progressing Goal: Will remain free from infection Outcome: Progressing Goal: Diagnostic test results will improve Outcome: Progressing Goal: Respiratory complications will improve Outcome: Progressing Goal: Cardiovascular complication will be avoided Outcome: Progressing   Problem: Activity: Goal: Risk for activity intolerance will decrease Outcome: Progressing   Problem: Nutrition: Goal: Adequate nutrition will be maintained Outcome: Progressing   Problem: Coping: Goal: Level of anxiety will decrease Outcome: Progressing   Problem: Elimination: Goal: Will not experience complications related to bowel motility Outcome: Progressing Goal: Will not experience complications related to urinary retention Outcome: Progressing   Problem: Pain Managment: Goal: General experience of comfort will improve and/or be controlled Outcome: Progressing   Problem: Safety: Goal: Ability to remain free from injury will improve Outcome: Progressing   Problem: Skin Integrity: Goal: Risk for impaired skin integrity will decrease Outcome: Progressing   Problem: Education: Goal: Ability to describe self-care measures that may prevent or decrease complications (Diabetes Survival Skills Education) will improve Outcome: Progressing Goal: Individualized Educational Video(s) Outcome: Progressing   Problem: Coping: Goal: Ability to adjust to condition or change in health will improve Outcome: Progressing   Problem: Fluid  Volume: Goal: Ability to maintain a balanced intake and output will improve Outcome: Progressing   Problem: Health Behavior/Discharge Planning: Goal: Ability to identify and utilize available resources and services will improve Outcome: Progressing Goal: Ability to manage health-related needs will improve Outcome: Progressing   Problem: Metabolic: Goal: Ability to maintain appropriate glucose levels will improve Outcome: Progressing   Problem: Nutritional: Goal: Maintenance of adequate nutrition will improve Outcome: Progressing Goal: Progress toward achieving an optimal weight will improve Outcome: Progressing   Problem: Skin Integrity: Goal: Risk for impaired skin integrity will decrease Outcome: Progressing   Problem: Tissue Perfusion: Goal: Adequacy of tissue perfusion will improve Outcome: Progressing   Problem: Education: Goal: Knowledge of disease and its progression will improve Outcome: Progressing   Problem: Health Behavior/Discharge Planning: Goal: Ability to manage health-related needs will improve Outcome: Progressing   Problem: Clinical Measurements: Goal: Complications related to the disease process or treatment will be avoided or minimized Outcome: Progressing Goal: Dialysis access will remain free of complications Outcome: Progressing   Problem: Activity: Goal: Activity intolerance will improve Outcome: Progressing   Problem: Fluid Volume: Goal: Fluid volume balance will be maintained or improved Outcome: Progressing   Problem: Nutritional: Goal: Ability to make appropriate dietary choices will improve Outcome: Progressing   Problem: Respiratory: Goal: Respiratory symptoms related to disease process will be avoided Outcome: Progressing   Problem: Self-Concept: Goal: Body image disturbance will be avoided or minimized Outcome: Progressing   Problem: Urinary Elimination: Goal: Progression of disease will be identified and treated Outcome:  Progressing

## 2024-03-29 NOTE — Progress Notes (Addendum)
 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 03/08/24 transferred to [facility] for initiation of CRRT. 03/09/24 seizure like episode, intubated. 03/15/24 extubated.   OT comments  Chart reviewed to date, pt greeted semi supine in bed, agreeable to OT tx session targeting improving functional activity tolerance in prep for ADL tasks. Re-evaluation completed with goals updated as appropriate. Pt continues to make progress towards goals as evidence by performing bed mobility with MIN-MOD A +1, STS with MIN A +1 with RW, amb approx 4' in room with RW two attempts with MIN A. Intermittent-freuent vcs throghout for technique. Vss throughout, no reports of dizziness. Pt is left in modified chair position, all needs met. OT will continue to follow.       If plan is discharge home, recommend the following:  A lot of help with walking and/or transfers;A lot of help with bathing/dressing/bathroom   Equipment Recommendations  BSC/3in1    Recommendations for Other Services      Precautions / Restrictions Precautions Precautions: Fall Recall of Precautions/Restrictions: Impaired Restrictions Weight Bearing Restrictions Per Provider Order: No       Mobility Bed Mobility Overal bed mobility: Needs Assistance Bed Mobility: Supine to Sit, Sit to Supine     Supine to sit: Min assist, HOB elevated, Used rails Sit to supine: Mod assist, Used rails   General bed mobility comments: frequent vcs for technique, assist for managing BLE    Transfers Overall transfer level: Needs  assistance Equipment used: Rolling walker (2 wheels) Transfers: Sit to/from Stand Sit to Stand: Min assist, From elevated surface (three attempts, intermittent vcs for technique)                 Balance Overall balance assessment: Needs assistance Sitting-balance support: No upper extremity supported, Feet supported Sitting balance-Leahy Scale: Good     Standing balance support: Bilateral upper extremity supported, During functional activity, Reliant on assistive device for balance Standing balance-Leahy Scale: Fair                             ADL either performed or assessed with clinical judgement   ADL Overall ADL's : Needs assistance/impaired     Grooming: Supervision/safety;Standing;Wash/dry face               Lower Body Dressing: Maximal assistance               Functional mobility during ADLs: Minimal assistance;Rolling walker (2 wheels);Cueing for sequencing (approx 4' forward and back with RW two attempts)      Extremity/Trunk Assessment              Vision       Perception     Praxis     Communication Communication Communication: Impaired Factors Affecting Communication: Hearing impaired   Cognition Arousal: Alert Behavior During Therapy: WFL for tasks assessed/performed Cognition: Difficult to assess, Cognition impaired Difficult to assess due to: Hard of hearing/deaf Orientation impairments: Time       Executive functioning impairment (select all impairments): Problem solving, Reasoning OT - Cognition Comments: will  continue to assess                 Following commands: Impaired Following commands impaired: Follows one step commands with increased time      Cueing   Cueing Techniques: Verbal cues, Tactile cues  Exercises Other Exercises Other Exercises: edu re role of OT, role of rehab, importance of continued mobility attempts    Shoulder Instructions       General Comments vss; no reports of  dizziness, spo2 >90% on 4 L via [place] throughout    Pertinent Vitals/ Pain       Pain Assessment Pain Assessment: Faces Faces Pain Scale: Hurts a little bit Pain Location: sacrum Pain Descriptors / Indicators: Grimacing Pain Intervention(s): Repositioned, Monitored during session  Home Living                                          Prior Functioning/Environment              Frequency  Min 2X/week        Progress Toward Goals  OT Goals(current goals can now be found in the care plan section)  Progress towards OT goals: Progressing toward goals  Acute Rehab OT Goals Patient Stated Goal: get stronger Time For Goal Achievement: 04/12/24 Potential to Achieve Goals: Good ADL Goals Pt Will Perform Grooming: with modified independence;sitting;standing Pt Will Perform Lower Body Dressing: with modified independence;sit to/from stand;sitting/lateral leans  Plan      Co-evaluation                 AM-PAC OT 6 Clicks Daily Activity     Outcome Measure   Help from another person eating meals?: None Help from another person taking care of personal grooming?: None Help from another person toileting, which includes using toliet, bedpan, or urinal?: A Lot Help from another person bathing (including washing, rinsing, drying)?: A Lot Help from another person to put on and taking off regular upper body clothing?: A Little Help from another person to put on and taking off regular lower body clothing?: A Lot 6 Click Score: 17    End of Session Equipment Utilized During Treatment: Rolling walker (2 wheels);Oxygen   OT Visit Diagnosis: Other abnormalities of gait and mobility (R26.89);Muscle weakness (generalized) (M62.81)   Activity Tolerance Patient tolerated treatment well   Patient Left in bed;with call bell/phone within reach;with bed alarm set   Nurse Communication Mobility status        Time: 8484-8466 OT Time Calculation (min): 18  min  Charges: OT General Charges $OT Visit: 1 Visit OT Evaluation $OT Re-eval: 1 Re-eval OT Treatments $Therapeutic Activity: 8-22 mins  Therisa Sheffield, OTD OTR/L  03/29/24, 3:43 PM

## 2024-03-29 NOTE — Assessment & Plan Note (Addendum)
 With balance difficulty Physical therapy recommending rehab.  Family would like to take home with home health.  12/10: Home health PT, OT ordered, face-to-face

## 2024-03-29 NOTE — Progress Notes (Signed)
 [facility] [place], [place] 03/29/24  Subjective:   Hospital day # 35 78 year old male with hypertension, heart failure with preserved ejection fraction, hyperlipidemia, stage IV CKD, obstructive sleep apnea, MGUS, chronic respiratory failure on 2 L oxygen  presented from [facility] with shortness of breath.  Transferred to [facility] for need of continuous dialysis.  Update:   Patient seen sitting up in bed Alert and oriented Remains on 4L [place]   Objective:  Vital signs in last 24 hours:  Temp:  [97.6 F (36.4 C)-98.1 F (36.7 C)] 97.6 F (36.4 C) (12/05 0807) Pulse Rate:  [66-74] 72 (12/05 0807) Resp:  [15-17] 17 (12/05 0807) BP: (105-113)/(57-59) 106/59 (12/05 0807) SpO2:  [100 %] 100 % (12/05 0807) FiO2 (%):  [35 %] 35 % (12/04 2325) Weight:  [117.9 kg] 117.9 kg (12/05 0500)  Weight change: -0.8 kg Filed Weights   03/28/24 0745 03/28/24 1145 03/29/24 0500  Weight: 122 kg 120.4 kg 117.9 kg    Intake/Output:    Intake/Output Summary (Last 24 hours) at 03/29/2024 1448 Last data filed at 03/29/2024 0900 Gross per 24 hour  Intake 420 ml  Output --  Net 420 ml      Physical Exam: General: NAD  Pulm/lungs Course, [place] O2  CVS/Heart S1S2 no rubs  Abdomen:  Distended, nontender  Extremities: 2+ bilateral lower extremity edema  Neurologic: Alert, hard of hearing  Skin: Warm, dry  Access: Rt internal jugular permcath       Basic Metabolic Panel:  Recent Labs  Lab 03/24/24 0441 03/24/24 1532 03/25/24 0451 03/26/24 0510 03/27/24 0427 03/28/24 0520 03/29/24 0458  NA 135   < > 138 132* 133* 134* 133*  K 4.3   < > 4.9 5.1 5.4* 5.1 4.2  CL 101   < > 103 99 98 98 96*  CO2 26   < > 27 25 26 26 30   GLUCOSE 99   < > 107* 122* 92 89 93  BUN 17   < > 23 24* 37* 47* 33*  CREATININE 1.80*   < > 2.82* 3.23* 4.64* 5.12* 3.79*  CALCIUM  8.5*   < > 9.1 8.9 9.2 9.0 8.6*  MG 2.3  --  2.3 2.1 2.3 2.3  --   PHOS 3.0   < > 3.8 4.2 5.7* 6.0* 3.9   < >  = values in this interval not displayed.     CBC: Recent Labs  Lab 03/25/24 0451 03/26/24 0510 03/27/24 0427 03/27/24 2056 03/28/24 0520 03/29/24 0458  WBC 9.2 18.6* 10.2  --  7.2 6.0  NEUTROABS  --  16.7* 8.3*  --  5.7 4.2  HGB 7.6* 7.1* 6.9* 7.6* 7.5* 7.5*  HCT 24.0* 22.0* 21.9* 24.5* 23.5* 23.4*  MCV 101.3* 100.5* 103.3*  --  100.9* 100.4*  PLT 159 144* 151  --  150 151      Lab Results  Component Value Date   HEPBSAG NON REACTIVE 03/16/2024      Microbiology:  Recent Results (from the past 240 hours)  Expectorated Sputum Assessment w Gram Stain, Rflx to Resp Cult     Status: None   Collection Time: 03/21/24  5:20 PM   Specimen: Throat; Sputum  Result Value Ref Range Status   Specimen Description THROAT  Final   Special Requests NONE  Final   Sputum evaluation   Final    Sputum specimen not acceptable for testing.  Please recollect.   RECOLLECT CH[address]QUITA [address] 03/21/2024 [facility] Performed at  [facility], [address] [place], [place] [address]    Report Status 03/21/2024 FINAL  Final    Coagulation Studies: No results for input(s): LABPROT, INR in the last 72 hours.   Urinalysis: No results for input(s): COLORURINE, LABSPEC, PHURINE, GLUCOSEU, HGBUR, BILIRUBINUR, KETONESUR, PROTEINUR, UROBILINOGEN, NITRITE, LEUKOCYTESUR in the last 72 hours.  Invalid input(s): APPERANCEUR    Imaging: No results found.       Medications:      vitamin C   500 mg Oral BID   Chlorhexidine  Gluconate Cloth  6 each Topical Q0600   epoetin  alfa-epbx (RETACRIT ) injection  20,000 Units Subcutaneous Weekly   feeding supplement (NEPRO CARB STEADY)  237 mL Oral TID BM   heparin  injection (subcutaneous)  5,000 Units Subcutaneous Q8H   melatonin  2.5 mg Oral QHS   midodrine   10 mg Oral TID WC   multivitamin  1 tablet Oral QHS   mouth rinse  15 mL Mouth Rinse 4 times per day   sertraline   25 mg Oral Daily   zinc  sulfate (50mg   elemental zinc )  220 mg Oral Daily   alteplase , docusate sodium , guaiFENesin -dextromethorphan , heparin , ipratropium-albuterol , midodrine , ondansetron  (ZOFRAN ) IV, mouth rinse, phenol, polyethylene glycol, sodium chloride , sodium chloride  flush, traZODone   Assessment/ Plan:  78 y.o. 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  Baseline creatinine of 2.5-2.8. Acute kidney injury is thought to be secondary to pneumonia and circulatory shock causing ATN.  Patient responded well to IV Lasix  03/13/24.  However due to low CVP, further doses were held. CRRT stopped 11/22. First HD 11/23.  Update:  Will continue Midodrine  with dialysis. Dialysis received yesterday with UF 1.5L achieved. Next treatment scheduled for Saturday.   Lab Results  Component Value Date   CREATININE 3.79 (H) 03/29/2024   CREATININE 5.12 (H) 03/28/2024   CREATININE 4.64 (H) 03/27/2024     Intake/Output Summary (Last 24 hours) at 03/29/2024 1448 Last data filed at 03/29/2024 0900 Gross per 24 hour  Intake 420 ml  Output --  Net 420 ml      Acute respiratory failure Respiratory pathogen's panel by PCR is negative.  Blood cultures from 03/06/2024 are negative. Uses CPAP at night.  Weaning per protocol, remains on 4 L nasal cannula.  Anemia of CKD.  Hemoglobin stable 7.5. Continue retacrit  20000 units subcutaneous weekly.    LOS: 21 Dave Brown 12/5/20252:48 PM  [address] [place], [place] [phone]

## 2024-03-29 NOTE — TOC Progression Note (Signed)
 Transition of Care [facility]) - Progression Note    Patient Details  Name: Dave Brown MRN: [id number] Date of Birth: Mar 07, 1946  [facility] CM/SW Contact  Alfonso Rummer, LCSW Phone Number: 03/29/2024, 4:16 PM  Clinical Narrative:     [name] meet with pt at bedside in room 225. Pt reports he is feeling better and does not have any additional concerns. Pt daughter reports pt will not transition to a skilled nursing facility and will [facility].   Expected Discharge Plan: Home w Home Health Services Barriers to Discharge: Continued Medical Work up               Expected Discharge Plan and Services   Discharge Planning Services: CM Consult   Living arrangements for the past 2 months: Single Family Home                                       Social Drivers of Health (SDOH) Interventions SDOH Screenings   Food Insecurity: No Food Insecurity (03/07/2024)  Housing: Low Risk  (03/07/2024)  Transportation Needs: No Transportation Needs (03/07/2024)  Utilities: Not At Risk (03/07/2024)  Social Connections: Unknown (03/07/2024)  Tobacco Use: Medium Risk (03/19/2024)    Readmission Risk Interventions    03/18/2024   12:04 PM  Readmission Risk Prevention Plan  Transportation Screening Complete  PCP or Specialist Appt within 3-5 Days Complete  HRI or Home Care Consult Complete  Social Work Consult for Recovery Care Planning/Counseling Complete  Palliative Care Screening Not Applicable  Medication Review Oceanographer) Complete

## 2024-03-30 LAB — RENAL FUNCTION PANEL
Albumin: 3.2 g/dL — ABNORMAL LOW (ref 3.5–5.0)
Anion gap: 8 (ref 5–15)
BUN: 40 mg/dL — ABNORMAL HIGH (ref 8–23)
CO2: 28 mmol/L (ref 22–32)
Calcium: 9 mg/dL (ref 8.9–10.3)
Chloride: 98 mmol/L (ref 98–111)
Creatinine, Ser: 4.57 mg/dL — ABNORMAL HIGH (ref 0.61–1.24)
GFR, Estimated: 12 mL/min — ABNORMAL LOW (ref >60)
Glucose, Bld: 94 mg/dL (ref 70–99)
Phosphorus: 4.1 mg/dL (ref 2.5–4.6)
Potassium: 4.5 mmol/L (ref 3.5–5.1)
Sodium: 135 mmol/L (ref 135–145)

## 2024-03-30 LAB — CBC
HCT: 24.1 % — ABNORMAL LOW (ref 39.0–52.0)
Hemoglobin: 7.6 g/dL — ABNORMAL LOW (ref 13.0–17.0)
MCH: 32.2 pg (ref 26.0–34.0)
MCHC: 31.5 g/dL (ref 30.0–36.0)
MCV: 102.1 fL — ABNORMAL HIGH (ref 80.0–100.0)
Platelets: 176 K/uL (ref 150–400)
RBC: 2.36 MIL/uL — ABNORMAL LOW (ref 4.22–5.81)
RDW: 15.9 % — ABNORMAL HIGH (ref 11.5–15.5)
WBC: 6.2 K/uL (ref 4.0–10.5)
nRBC: 0 % (ref 0.0–0.2)

## 2024-03-30 LAB — GLUCOSE, CAPILLARY: Glucose-Capillary: 110 mg/dL — ABNORMAL HIGH (ref 70–99)

## 2024-03-30 MED ORDER — EPOETIN ALFA-EPBX 10000 UNIT/ML IJ SOLN
INTRAMUSCULAR | Status: AC
  Filled 2024-03-30: qty 2

## 2024-03-30 MED ORDER — HEPARIN SODIUM (PORCINE) 1000 UNIT/ML IJ SOLN
INTRAMUSCULAR | Status: AC
  Filled 2024-03-30: qty 4

## 2024-03-30 MED ORDER — MIDODRINE HCL 5 MG PO TABS
ORAL_TABLET | ORAL | Status: AC
  Filled 2024-03-30: qty 2

## 2024-03-30 NOTE — Progress Notes (Signed)
 [facility] [place], [place] 03/30/24  Subjective:   Hospital day # 60 78 year old male with hypertension, heart failure with preserved ejection fraction, hyperlipidemia, stage IV CKD, obstructive sleep apnea, MGUS, chronic respiratory failure on 2 L oxygen  presented from [facility] with shortness of breath.  Transferred to [facility] for need of continuous dialysis.  Update:   Patient seen and evaluated during dialysis   HEMODIALYSIS FLOWSHEET:  Blood Flow Rate (mL/min): 359 mL/min Arterial Pressure (mmHg): -153.32 mmHg Venous Pressure (mmHg): 142.82 mmHg TMP (mmHg): 14.75 mmHg Ultrafiltration Rate (mL/min): 983 mL/min Dialysate Flow Rate (mL/min): 299 ml/min Dialysis Fluid Bolus: Normal Saline Bolus Amount (mL): 300 mL  Tolerating treatment well.    Objective:  Vital signs in last 24 hours:  Temp:  [98.2 F (36.8 C)-98.7 F (37.1 C)] 98.4 F (36.9 C) (12/06 0802) Pulse Rate:  [68-80] 79 (12/06 1100) Resp:  [15-24] 22 (12/06 1100) BP: (94-119)/(41-61) 94/50 (12/06 1100) SpO2:  [92 %-100 %] 94 % (12/06 1100) FiO2 (%):  [35 %] 35 % (12/05 2330) Weight:  [118.3 kg-118.4 kg] 118.4 kg (12/06 0802)  Weight change: -3.7 kg Filed Weights   03/29/24 0500 03/30/24 0500 03/30/24 0802  Weight: 117.9 kg 118.3 kg 118.4 kg    Intake/Output:   No intake or output data in the 24 hours ending 03/30/24 1147     Physical Exam: General: NAD  Pulm/lungs Diminished, [place] O2  CVS/Heart S1S2 no rubs  Abdomen:  Distended, nontender  Extremities: 1+ bilateral lower extremity edema  Neurologic: Alert, hard of hearing  Skin: Warm, dry  Access: Rt internal jugular permcath       Basic Metabolic Panel:  Recent Labs  Lab 03/24/24 0441 03/24/24 1532 03/25/24 0451 03/26/24 0510 03/27/24 0427 03/28/24 0520 03/29/24 0458 03/30/24 0900  NA 135   < > 138 132* 133* 134* 133* 135  K 4.3   < > 4.9 5.1 5.4* 5.1 4.2 4.5  CL 101   < > 103 99 98 98 96* 98  CO2  26   < > 27 25 26 26 30 28   GLUCOSE 99   < > 107* 122* 92 89 93 94  BUN 17   < > 23 24* 37* 47* 33* 40*  CREATININE 1.80*   < > 2.82* 3.23* 4.64* 5.12* 3.79* 4.57*  CALCIUM  8.5*   < > 9.1 8.9 9.2 9.0 8.6* 9.0  MG 2.3  --  2.3 2.1 2.3 2.3  --   --   PHOS 3.0   < > 3.8 4.2 5.7* 6.0* 3.9 4.1   < > = values in this interval not displayed.     CBC: Recent Labs  Lab 03/26/24 0510 03/27/24 0427 03/27/24 2056 03/28/24 0520 03/29/24 0458 03/30/24 0900  WBC 18.6* 10.2  --  7.2 6.0 6.2  NEUTROABS 16.7* 8.3*  --  5.7 4.2  --   HGB 7.1* 6.9* 7.6* 7.5* 7.5* 7.6*  HCT 22.0* 21.9* 24.5* 23.5* 23.4* 24.1*  MCV 100.5* 103.3*  --  100.9* 100.4* 102.1*  PLT 144* 151  --  150 151 176      Lab Results  Component Value Date   HEPBSAG NON REACTIVE 03/16/2024      Microbiology:  Recent Results (from the past 240 hours)  Expectorated Sputum Assessment w Gram Stain, Rflx to Resp Cult     Status: None   Collection Time: 03/21/24  5:20 PM   Specimen: Throat; Sputum  Result Value Ref Range Status  Specimen Description THROAT  Final   Special Requests NONE  Final   Sputum evaluation   Final    Sputum specimen not acceptable for testing.  Please recollect.   RECOLLECT [address]ITA [address] 03/21/2024 [facility] Performed at [facility], [address] [place], [place] [address]    Report Status 03/21/2024 FINAL  Final    Coagulation Studies: No results for input(s): LABPROT, INR in the last 72 hours.   Urinalysis: No results for input(s): COLORURINE, LABSPEC, PHURINE, GLUCOSEU, HGBUR, BILIRUBINUR, KETONESUR, PROTEINUR, UROBILINOGEN, NITRITE, LEUKOCYTESUR in the last 72 hours.  Invalid input(s): APPERANCEUR    Imaging: No results found.       Medications:      vitamin C   500 mg Oral BID   Chlorhexidine  Gluconate Cloth  6 each Topical Q0600   epoetin  alfa-epbx (RETACRIT ) injection  20,000 Units Subcutaneous Weekly   feeding supplement (NEPRO  CARB STEADY)  237 mL Oral TID BM   heparin  injection (subcutaneous)  5,000 Units Subcutaneous Q8H   melatonin  2.5 mg Oral QHS   midodrine   10 mg Oral TID WC   multivitamin  1 tablet Oral QHS   mouth rinse  15 mL Mouth Rinse 4 times per day   sertraline   25 mg Oral Daily   zinc  sulfate (50mg  elemental zinc )  220 mg Oral Daily   alteplase , docusate sodium , guaiFENesin -dextromethorphan , heparin , ipratropium-albuterol , midodrine , ondansetron  (ZOFRAN ) IV, mouth rinse, phenol, polyethylene glycol, sodium chloride , sodium chloride  flush, traZODone   Assessment/ Plan:  78 y.o. 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  Baseline creatinine of 2.5-2.8. Acute kidney injury is thought to be secondary to pneumonia and circulatory shock causing ATN.  Patient responded well to IV Lasix  03/13/24.  However due to low CVP, further doses were held. CRRT stopped 11/22. First HD 11/23.  Update:  Receiving dialysis today, UF 2L as tolerated. Midodrine  ordered with dialysis. Next treatment scheduled for Tuesday. Will begin outpatient clinic search on Monday. Will need to sit in chair on Tuesday for dialysis. Can sit in recliner at bedside for 4 hours as well.   Lab Results  Component Value Date   CREATININE 4.57 (H) 03/30/2024   CREATININE 3.79 (H) 03/29/2024   CREATININE 5.12 (H) 03/28/2024    No intake or output data in the 24 hours ending 03/30/24 1147     Acute respiratory failure Respiratory pathogen's panel by PCR is negative.  Blood cultures from 03/06/2024 are negative. Uses CPAP at night.  Weaning, 2L nasal cannula.  Anemia of  CKD.  Hemoglobin 7.6. Will transition retacrit  to 10000 units IV with dialysis next week.    LOS: 22 Faith Harris 12/6/202511:47 AM  [address] [place], [place] [phone]

## 2024-03-30 NOTE — Progress Notes (Signed)
   03/30/24 1228  Vitals  Temp 98.5 F (36.9 C)  Temp Source Oral  BP (!) 103/48  MAP (mmHg) (!) 64  BP Location Left Arm  BP Method Automatic  Patient Position (if appropriate) Lying  Pulse Rate 79  Pulse Rate Source Monitor  ECG Heart Rate 79  Resp (!) 25  Oxygen  Therapy  SpO2 96 %  O2 Device Room Air  O2 Flow Rate (L/min) 2 L/min  Patient Activity (if Appropriate) In bed  Pulse Oximetry Type Continuous  Oximetry Probe Site Changed No  During Treatment Monitoring  Dialysate Potassium Concentration 2  Dialysate Calcium  Concentration 2.5  HD Safety Checks Performed Yes  Intra-Hemodialysis Comments Tx completed  Post Treatment  Dialyzer Clearance Lightly streaked  Hemodialysis Intake (mL) 0 mL  Liters Processed 74.5  Fluid Removed (mL) 1900 mL  Tolerated HD Treatment Yes  Hemodialysis Catheter Right Internal jugular Double lumen Permanent (Tunneled)  Placement Date/Time: 03/27/24 0926   Serial / Lot #: [id number]  Expiration Date: 01/03/27  Time Out: Correct patient;Correct site;Correct procedure  Maximum sterile barrier precautions: Hand hygiene;Cap;Mask;Sterile gown;Sterile gloves;Large sterile ...  Site Condition No complications  Blue Lumen Status Flushed;Heparin  locked;Dead end cap in place  Red Lumen Status Flushed;Heparin  locked;Dead end cap in place  Purple Lumen Status N/A  Catheter fill solution Heparin  1000 units/ml  Catheter fill volume (Arterial) 2 cc  Catheter fill volume (Venous) 2.2  Dressing Type Transparent  Dressing Status Antimicrobial disc/dressing in place  Drainage Description None  Dressing Change Due 04/04/24  Post treatment catheter status Capped and Clamped   No issues. Pt tolerated tx well. Removed 1900 ml d/c 5 mins early bec  of error code Transmembrane error.

## 2024-03-30 NOTE — Progress Notes (Signed)
 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 He denied any fevers, chills, chest pain.     Of note, the patient was recently admitted to the hospital from 02/01/2024 to 02/05/2024 for respiratory failure due to CHF.  The patient did not require oxygen  at the time of his discharge.  His discharge weight was 286.  At his last office visit with his primary provider his weight was 291 pounds on 02/22/24.  Follow up with pulmonary on 03/04/24 office weight 29.   He was discharged home with furosemide  40 mg daily during last hospitalization.  11/12: Admitted to [facility] to [place] for treatment of Acute Decompensated HFpEF and AKI. 11/13: BP decreased, started on Levophed .  PCCM consulted.  ABX discontinued. 11/14: Transfer to [facility] for initiation of CRRT.  11/15: seizure like episode, intubated 11/16: Unable to wean; PICC line placed 11/21: Extubated to Bipap. Added Dobutamine .  11/23: Patient remains on bipap this AM. WBC stable at 7.6. H&H Stable. Coox SvO2 67%.  Electrolytes within normal range. NE decreased to 4. Remains on Dobutamin at 5.  11/24: Off BiPAP, awake and alert but remains encephalopathic.  Afebrile, Remains on Levophed , currently 10 mcg, weaning as tolerated.  Creatinine increased to 2.9, but UOP 1.4 L (net - 7.8L), Nephrology holding off on HD today. 11/25: No significant events overnight.  Midodrine  started overnight, now off Levophed .   Precedex  weaned off yesterday, remains encephalopathic but slowly improving. On 4L [place]. Creatinine worsened to 3.6, UOP 675 cc last  24 hrs (net - 8.4L), plan for HD today.  Plan for aggressive pulmonary toilet and mobilization as able.  11/26: Remains critically ill; desatted to the 80s overnight, placed on bipap and developed hypotension requiring initiation of levophed  gtt again. Hypercapnic overnight, will recheck vbg. Nephrology following, placed back on CRRT.  11/27: No acute events overnight, afebrile, remains on Levophed  and CRRT.  Worsening Leukocytosis, obtain Sputum culture and start Linezolid .  Weaned off BiPAP to [place] 11/28: remains on CRRT remains on pressors 12/01: Pt confused this am and refused to keep Bipap on.  Pt stable on 2L O2 via nasal canula no longer requiring vasopressors  03/26/24- patient for permcath placement. He is eating on his own but still behind on calories. He is being moved to [facility]H and meds are refined today.  03/27/24- patient s/p permcath today.  He is hypotensive and has severe anemia this am.  Mild electrolyte derrangements which will improve post HD.  12/4.  Medical team taking over.  Patient tolerating dialysis with fluid removal.  Will transfer out of ICU. 12/6.  pCO2 58 on blood gas this morning.  Will be a candidate for noninvasive ventilation at home.  Dialysis today.  Will need outpatient dialysis.  Assessment and Plan: * Acute heart failure with preserved ejection fraction (HFpEF) (HCC) EF greater than 75%, dialysis to help out with fluid management.  Blood pressure too low for other cardiac meds.  Acute kidney injury superimposed on CKD AKI on CKD stage IV.  Patient required continuous dialysis while in the ICU and now on  hemodialysis.  PermCath placed on 12/3.  Nephrology to look into outpatient dialysis units.  Acute on chronic respiratory failure with hypoxia and hypercapnia (HCC) Patient on nasal cannula oxygen  and BiPAP at night.  Patient was intubated on 11/15 and extubated on 11/21.  Blood gas today shows pCO2 of 58.  Patient is a candidate for noninvasive ventilation and this  will decrease hospitalizations.  Toxic metabolic encephalopathy Mental status improved likely secondary to uremia  PAF (paroxysmal atrial fibrillation) (HCC) Patient not on any blood thinners or rate controlling medications at this point.  Patient did have hemoptysis and hematuria and anemia.  [facility]) Required pressors during the hospital course.  Now on midodrine  prior to dialysis.  MRSA pneumonia (HCC) Treated during the hospital course  Anemia due to chronic kidney disease Anemia of chronic disease.  Hemoglobin 7.6.  May end up needing a transfusion.  Generalized weakness Physical therapy recommending rehab.  Family would like to take home with home health.  Myocardial injury Seen by cardiology early on the hospital course was on heparin  but stopped secondary to hemoptysis and hematuria.  Cardiology will need to follow-up as outpatient.  Blood pressure too low for cardiac meds.        Subjective: Patient feeling okay.  Admitted 21 days ago with shortness of breath and CHF exacerbation  Physical Exam: Vitals:   03/30/24 0930 03/30/24 1000 03/30/24 1030 03/30/24 1100  BP: (!) 110/46 (!) 105/49 (!) 108/54 (!) 94/50  Pulse: 73 74 79 79  Resp: 16 16 (!) 24 (!) 22  Temp:      TempSrc:      SpO2: 95% 93% 93% 94%  Weight:      Height:       Physical Exam HENT:     Head: Normocephalic.     Mouth/Throat:     Pharynx: No oropharyngeal exudate.  Eyes:     General: Lids are normal.     Conjunctiva/sclera: Conjunctivae normal.  Cardiovascular:     Rate and Rhythm: Normal rate and regular rhythm.     Heart sounds: Normal heart sounds, S1 normal and S2 normal.  Pulmonary:     Breath sounds: Examination of the right-lower field reveals decreased breath sounds. Examination of the left-lower field reveals decreased breath sounds. Decreased breath sounds present. No wheezing, rhonchi or rales.  Abdominal:     Palpations: Abdomen is soft.     Tenderness: There is no  abdominal tenderness.  Musculoskeletal:     Right lower leg: No swelling.     Left lower leg: No swelling.  Skin:    General: Skin is warm.     Findings: No rash.  Neurological:     Mental Status: He is alert.     Data Reviewed: Creatinine 4.57 with a GFR of 12, hemoglobin 7.6  Family Communication: Spoke with daughter on the phone.  Disposition: Status is: Inpatient Remains inpatient appropriate because: Nephrology team to look into outpatient hemodialysis, candidate for NIV at night.  Planned Discharge Destination: Physical therapy recommended rehab by transitional care team spoke with patient's daughter and they want to take him home with home health    Time spent: 28 minutes  Author: Charlie Patterson, MD 03/30/2024 11:12 AM  For on call review [email or web address].

## 2024-03-30 NOTE — Plan of Care (Signed)
  Problem: Education: Goal: Knowledge of General Education information will improve Description: Including pain rating scale, medication(s)/side effects and non-pharmacologic comfort measures Outcome: Progressing   Problem: Health Behavior/Discharge Planning: Goal: Ability to manage health-related needs will improve Outcome: Progressing   Problem: Clinical Measurements: Goal: Ability to maintain clinical measurements within normal limits will improve Outcome: Progressing Goal: Will remain free from infection Outcome: Progressing Goal: Diagnostic test results will improve Outcome: Progressing Goal: Respiratory complications will improve Outcome: Progressing Goal: Cardiovascular complication will be avoided Outcome: Progressing   Problem: Activity: Goal: Risk for activity intolerance will decrease Outcome: Progressing   Problem: Nutrition: Goal: Adequate nutrition will be maintained Outcome: Progressing   Problem: Coping: Goal: Level of anxiety will decrease Outcome: Progressing   Problem: Elimination: Goal: Will not experience complications related to bowel motility Outcome: Progressing Goal: Will not experience complications related to urinary retention Outcome: Progressing   Problem: Pain Managment: Goal: General experience of comfort will improve and/or be controlled Outcome: Progressing   Problem: Safety: Goal: Ability to remain free from injury will improve Outcome: Progressing   Problem: Skin Integrity: Goal: Risk for impaired skin integrity will decrease Outcome: Progressing   Problem: Education: Goal: Ability to describe self-care measures that may prevent or decrease complications (Diabetes Survival Skills Education) will improve Outcome: Progressing Goal: Individualized Educational Video(s) Outcome: Progressing   Problem: Coping: Goal: Ability to adjust to condition or change in health will improve Outcome: Progressing   Problem: Fluid  Volume: Goal: Ability to maintain a balanced intake and output will improve Outcome: Progressing   Problem: Health Behavior/Discharge Planning: Goal: Ability to identify and utilize available resources and services will improve Outcome: Progressing Goal: Ability to manage health-related needs will improve Outcome: Progressing   Problem: Metabolic: Goal: Ability to maintain appropriate glucose levels will improve Outcome: Progressing   Problem: Nutritional: Goal: Maintenance of adequate nutrition will improve Outcome: Progressing Goal: Progress toward achieving an optimal weight will improve Outcome: Progressing   Problem: Skin Integrity: Goal: Risk for impaired skin integrity will decrease Outcome: Progressing   Problem: Tissue Perfusion: Goal: Adequacy of tissue perfusion will improve Outcome: Progressing   Problem: Education: Goal: Knowledge of disease and its progression will improve Outcome: Progressing   Problem: Health Behavior/Discharge Planning: Goal: Ability to manage health-related needs will improve Outcome: Progressing   Problem: Clinical Measurements: Goal: Complications related to the disease process or treatment will be avoided or minimized Outcome: Progressing Goal: Dialysis access will remain free of complications Outcome: Progressing   Problem: Activity: Goal: Activity intolerance will improve Outcome: Progressing   Problem: Fluid Volume: Goal: Fluid volume balance will be maintained or improved Outcome: Progressing   Problem: Nutritional: Goal: Ability to make appropriate dietary choices will improve Outcome: Progressing   Problem: Respiratory: Goal: Respiratory symptoms related to disease process will be avoided Outcome: Progressing   Problem: Self-Concept: Goal: Body image disturbance will be avoided or minimized Outcome: Progressing   Problem: Urinary Elimination: Goal: Progression of disease will be identified and treated Outcome:  Progressing

## 2024-03-31 NOTE — Progress Notes (Signed)
 PT Cancellation Note  Patient Details Name: Dave Brown MRN: [id number] DOB: [birth year]   Cancelled Treatment:    Reason Eval/Treat Not Completed: Other (comment)  Pt declined therapy session.  Stated he has eaten both meals EOB today and has been up to  [facility] several times with staff.  Stated he was tired and did not want to do anymore at this time.  Will return at a later time/date.   Lauraine Gills 03/31/2024, 1:17 PM

## 2024-03-31 NOTE — Progress Notes (Signed)
 [facility] [place], [place] 03/31/24  Subjective:   Hospital day # 12 78 year old male with hypertension, heart failure with preserved ejection fraction, hyperlipidemia, stage IV CKD, obstructive sleep apnea, MGUS, chronic respiratory failure on 2 L oxygen  presented from [facility] with shortness of breath.  Transferred to [facility] for need of continuous dialysis.  Update:   Patient seen laying in bed Fully awake and alert Remains on 2L   Objective:  Vital signs in last 24 hours:  Temp:  [98 F (36.7 C)-99.1 F (37.3 C)] 98.1 F (36.7 C) (12/07 0954) Pulse Rate:  [79-88] 88 (12/07 0954) Resp:  [16-25] 18 (12/07 0954) BP: (101-116)/(48-84) 105/60 (12/07 0954) SpO2:  [96 %-100 %] 98 % (12/07 0954) Weight:  [116.5 kg] 116.5 kg (12/06 1225)  Weight change: 0.1 kg Filed Weights   03/30/24 0500 03/30/24 0802 03/30/24 1225  Weight: 118.3 kg 118.4 kg 116.5 kg    Intake/Output:    Intake/Output Summary (Last 24 hours) at 03/31/2024 1154 Last data filed at 03/31/2024 0900 Gross per 24 hour  Intake 440 ml  Output 1900 ml  Net -1460 ml       Physical Exam: General: NAD  Pulm/lungs Diminished, [place] O2  CVS/Heart S1S2 no rubs  Abdomen:  Distended, nontender  Extremities: No bilateral lower extremity edema  Neurologic: Alert, hard of hearing  Skin: Warm, dry  Access: Rt internal jugular permcath       Basic Metabolic Panel:  Recent Labs  Lab 03/25/24 0451 03/26/24 0510 03/27/24 0427 03/28/24 0520 03/29/24 0458 03/30/24 0900  NA 138 132* 133* 134* 133* 135  K 4.9 5.1 5.4* 5.1 4.2 4.5  CL 103 99 98 98 96* 98  CO2 27 25 26 26 30 28   GLUCOSE 107* 122* 92 89 93 94  BUN 23 24* 37* 47* 33* 40*  CREATININE 2.82* 3.23* 4.64* 5.12* 3.79* 4.57*  CALCIUM  9.1 8.9 9.2 9.0 8.6* 9.0  MG 2.3 2.1 2.3 2.3  --   --   PHOS 3.8 4.2 5.7* 6.0* 3.9 4.1     CBC: Recent Labs  Lab 03/26/24 0510 03/27/24 0427 03/27/24 2056 03/28/24 0520 03/29/24 0458  03/30/24 0900  WBC 18.6* 10.2  --  7.2 6.0 6.2  NEUTROABS 16.7* 8.3*  --  5.7 4.2  --   HGB 7.1* 6.9* 7.6* 7.5* 7.5* 7.6*  HCT 22.0* 21.9* 24.5* 23.5* 23.4* 24.1*  MCV 100.5* 103.3*  --  100.9* 100.4* 102.1*  PLT 144* 151  --  150 151 176      Lab Results  Component Value Date   HEPBSAG NON REACTIVE 03/16/2024      Microbiology:  Recent Results (from the past 240 hours)  Expectorated Sputum Assessment w Gram Stain, Rflx to Resp Cult     Status: None   Collection Time: 03/21/24  5:20 PM   Specimen: Throat; Sputum  Result Value Ref Range Status   Specimen Description THROAT  Final   Special Requests NONE  Final   Sputum evaluation   Final    Sputum specimen not acceptable for testing.  Please recollect.   RECOLLECT [address] 03/21/2024 [facility] Performed at [facility], [address] [place], [place] [address]    Report Status 03/21/2024 FINAL  Final    Coagulation Studies: No results for input(s): LABPROT, INR in the last 72 hours.   Urinalysis: No results for input(s): COLORURINE, LABSPEC, PHURINE, GLUCOSEU, HGBUR, BILIRUBINUR, KETONESUR, PROTEINUR, UROBILINOGEN, NITRITE, LEUKOCYTESUR in the last 72  hours.  Invalid input(s): APPERANCEUR    Imaging: No results found.       Medications:      vitamin C   500 mg Oral BID   Chlorhexidine  Gluconate Cloth  6 each Topical Q0600   feeding supplement (NEPRO CARB STEADY)  237 mL Oral TID BM   heparin  injection (subcutaneous)  5,000 Units Subcutaneous Q8H   melatonin  2.5 mg Oral QHS   midodrine   10 mg Oral TID WC   multivitamin  1 tablet Oral QHS   mouth rinse  15 mL Mouth Rinse 4 times per day   sertraline   25 mg Oral Daily   zinc  sulfate (50mg  elemental zinc )  220 mg Oral Daily   docusate sodium , guaiFENesin -dextromethorphan , ipratropium-albuterol , midodrine , ondansetron  (ZOFRAN ) IV, mouth rinse, phenol, polyethylene glycol, sodium chloride , sodium chloride  flush,  traZODone   Assessment/ Plan:  78 y.o. 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  Baseline creatinine of 2.5-2.8. Acute kidney injury is thought to be secondary to pneumonia and circulatory shock causing ATN.  Patient responded well to IV Lasix  03/13/24.  However due to low CVP, further doses were held. CRRT stopped 11/22. First HD 11/23.  Update:  Dialysis received yesterday with UF 1.9L achieved.  Midodrine  ordered with dialysis. Next treatment scheduled for Tuesday. Will begin outpatient clinic search on Monday. Will need to sit in chair on Tuesday for dialysis. Can sit in recliner at bedside for 4 hours as well.   Lab Results  Component Value Date   CREATININE 4.57 (H) 03/30/2024   CREATININE 3.79 (H) 03/29/2024   CREATININE 5.12 (H) 03/28/2024     Intake/Output Summary (Last 24 hours) at 03/31/2024 1154 Last data filed at 03/31/2024 0900 Gross per 24 hour  Intake 440 ml  Output 1900 ml  Net -1460 ml       Acute respiratory failure Respiratory pathogen's panel by PCR is negative.  Blood cultures from 03/06/2024 are negative. Uses CPAP at night.  Weaning, 2L nasal cannula.  Anemia of CKD.  Hemoglobin 7.6, at last check. Will transition retacrit  to 10000 units IV with dialysis next week.    LOS: [address] Faith Harris 12/7/202511:54 AM  [address] [place], [place] [phone]

## 2024-03-31 NOTE — Progress Notes (Signed)
 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 He denied any fevers, chills, chest pain.     Of note, the patient was recently admitted to the hospital from 02/01/2024 to 02/05/2024 for respiratory failure due to CHF.  The patient did not require oxygen  at the time of his discharge.  His discharge weight was 286.  At his last office visit with his primary provider his weight was 291 pounds on 02/22/24.  Follow up with pulmonary on 03/04/24 office weight 29.   He was discharged home with furosemide  40 mg daily during last hospitalization.  11/12: Admitted to [facility]H to [place] for treatment of Acute Decompensated HFpEF and AKI. 11/13: BP decreased, started on Levophed .  PCCM consulted.  ABX discontinued. 11/14: Transfer to [facility] for initiation of CRRT.  11/15: seizure like episode, intubated 11/16: Unable to wean; PICC line placed 11/21: Extubated to Bipap. Added Dobutamine .  11/23: Patient remains on bipap this AM. WBC stable at 7.6. H&H Stable. Coox SvO2 67%.  Electrolytes within normal range. NE decreased to 4. Remains on Dobutamin at 5.  11/24: Off BiPAP, awake and alert but remains encephalopathic.  Afebrile, Remains on Levophed , currently 10 mcg, weaning as tolerated.  Creatinine increased to 2.9, but UOP 1.4 L (net - 7.8L), Nephrology holding off on HD today. 11/25: No significant events overnight.  Midodrine  started overnight, now off Levophed .   Precedex  weaned off yesterday, remains encephalopathic but slowly improving. On 4L [place]. Creatinine worsened to 3.6, UOP 675 cc last  24 hrs (net - 8.4L), plan for HD today.  Plan for aggressive pulmonary toilet and mobilization as able.  11/26: Remains critically ill; desatted to the 80s overnight, placed on bipap and developed hypotension requiring initiation of levophed  gtt again. Hypercapnic overnight, will recheck vbg. Nephrology following, placed back on CRRT.  11/27: No acute events overnight, afebrile, remains on Levophed  and CRRT.  Worsening Leukocytosis, obtain Sputum culture and start Linezolid .  Weaned off BiPAP to [place] 11/28: remains on CRRT remains on pressors 12/01: Pt confused this am and refused to keep Bipap on.  Pt stable on 2L O2 via nasal canula no longer requiring vasopressors  03/26/24- patient for permcath placement. He is eating on his own but still behind on calories. He is being moved to [facility] and meds are refined today.  03/27/24- patient s/p permcath today.  He is hypotensive and has severe anemia this am.  Mild electrolyte derrangements which will improve post HD.  12/4.  Medical team taking over.  Patient tolerating dialysis with fluid removal.  Will transfer out of ICU. 12/6.  pCO2 58 on blood gas this morning.  Will be a candidate for noninvasive ventilation at home.  Dialysis today.  Will need outpatient dialysis. 12/7.  Feels okay.  Assessment and Plan: * Acute heart failure with preserved ejection fraction (HFpEF) (HCC) EF greater than 75%, dialysis to help out with fluid management.  Blood pressure too low for other cardiac meds.  Acute kidney injury superimposed on CKD AKI on CKD stage IV.  Patient required continuous dialysis while in the  ICU and now on hemodialysis.  PermCath placed on 12/3.  Nephrology to look into outpatient dialysis units.  Acute on chronic respiratory failure with hypoxia and hypercapnia (HCC) Patient on nasal cannula oxygen  and BiPAP at night.  Patient was intubated on 11/15 and extubated on 11/21.  Blood gas yesterday shows pCO2 of 58.  Patient is a candidate for  noninvasive ventilation and this will decrease hospitalizations.  Toxic metabolic encephalopathy Mental status improved likely secondary to uremia  PAF (paroxysmal atrial fibrillation) (HCC) Patient not on any blood thinners or rate controlling medications at this point.  Patient did have hemoptysis and hematuria and anemia.  [facility]) Required pressors during the hospital course.  Now on midodrine  3 times daily.  MRSA pneumonia (HCC) Treated during the hospital course  Anemia due to chronic kidney disease Anemia of chronic disease.  Hemoglobin 7.6.  May end up needing a transfusion.  Generalized weakness Physical therapy recommending rehab.  Family would like to take home with home health.  Patient declined physical therapy today.  Spoke with daughter that he needs to work with physical therapy, for us  to feel confident that he can be successful at home.  Myocardial injury Seen by cardiology early on the hospital course was on heparin  but stopped secondary to hemoptysis and hematuria.  Cardiology will need to follow-up as outpatient.  Blood pressure too low for cardiac meds.        Subjective: Patient feels okay.  Offers no complaints.  Got up to the toilet today.  Declined physical therapy.  Admitted 22 days ago with heart failure exacerbation and acute on chronic respiratory failure.  Physical Exam: Vitals:   03/30/24 1611 03/30/24 2036 03/31/24 0444 03/31/24 0954  BP: 108/84 (!) 116/59 101/65 105/60  Pulse: 86 87 82 88  Resp: 16 16 16 18   Temp: 98.2 F (36.8 C) 99.1 F (37.3 C) 98 F (36.7 C) 98.1 F (36.7 C)  TempSrc: Oral Oral    SpO2: 96% 100% 99% 98%  Weight:      Height:       Physical Exam HENT:     Head: Normocephalic.     Mouth/Throat:     Pharynx: No oropharyngeal exudate.  Eyes:     General: Lids are normal.     Conjunctiva/sclera: Conjunctivae normal.  Cardiovascular:     Rate and Rhythm: Normal rate and regular rhythm.     Heart sounds: Normal  heart sounds, S1 normal and S2 normal.  Pulmonary:     Breath sounds: Examination of the right-lower field reveals decreased breath sounds. Examination of the left-lower field reveals decreased breath sounds. Decreased breath sounds present. No wheezing, rhonchi or rales.  Abdominal:     Palpations: Abdomen is soft.     Tenderness: There is no abdominal tenderness.  Musculoskeletal:     Right lower leg: No swelling.     Left lower leg: No swelling.  Skin:    General: Skin is warm.     Findings: No rash.  Neurological:     Mental Status: He is alert.     Data Reviewed: Labs creatinine 4.57 last hemoglobin 7.6  Family Communication: Updated daughter on the phone.  Disposition: Status is: Inpatient Remains inpatient appropriate because: Admitted and outpatient dialysis slot.  Will try to get NIV approved, we will see if he qualifies for oxygen  at home.  Planned Discharge Destination: Home with Home Health    Time spent: 28 minutes  Author: Charlie Patterson, MD 03/31/2024 3:15  PM  For on call review [email or web address].

## 2024-04-01 DIAGNOSIS — J449 Chronic obstructive pulmonary disease, unspecified: Secondary | ICD-10-CM | POA: Insufficient documentation

## 2024-04-01 LAB — HEMOGLOBIN: Hemoglobin: 8.3 g/dL — ABNORMAL LOW (ref 13.0–17.0)

## 2024-04-01 LAB — BASIC METABOLIC PANEL WITH GFR
Anion gap: 11 (ref 5–15)
BUN: 36 mg/dL — ABNORMAL HIGH (ref 8–23)
CO2: 26 mmol/L (ref 22–32)
Calcium: 9.7 mg/dL (ref 8.9–10.3)
Chloride: 100 mmol/L (ref 98–111)
Creatinine, Ser: 4.37 mg/dL — ABNORMAL HIGH (ref 0.61–1.24)
GFR, Estimated: 13 mL/min — ABNORMAL LOW (ref >60)
Glucose, Bld: 109 mg/dL — ABNORMAL HIGH (ref 70–99)
Potassium: 4.2 mmol/L (ref 3.5–5.1)
Sodium: 137 mmol/L (ref 135–145)

## 2024-04-01 MED ORDER — EPOETIN ALFA-EPBX 10000 UNIT/ML IJ SOLN
10000.0000 [IU] | INTRAMUSCULAR | Status: DC
  Administered 2024-04-02 – 2024-04-04 (×2): 10000 [IU] via INTRAVENOUS

## 2024-04-01 MED ORDER — PANTOPRAZOLE SODIUM 40 MG PO TBEC
40.0000 mg | DELAYED_RELEASE_TABLET | Freq: Every day | ORAL | Status: DC
  Administered 2024-04-02 – 2024-04-05 (×4): 40 mg via ORAL
  Filled 2024-04-01 (×4): qty 1

## 2024-04-01 NOTE — TOC Progression Note (Signed)
 Transition of Care [facility]) - Progression Note    Patient Details  Name: Tyra Michelle MRN: [id number] Date of Birth: 04-17-1946  Transition of Care [facility]) CM/SW Contact  Alfonso Rummer, LCSW Phone Number: 04/01/2024, 3:15 PM  Clinical Narrative:     NIV bipap/triology order submitted via [employer] D[employer]   Expected Discharge Plan: Home w Home Health Services Barriers to Discharge: Continued Medical Work up               Expected Discharge Plan and Services   Discharge Planning Services: CM Consult   Living arrangements for the past 2 months: Single Family Home                                       Social Drivers of Health (SDOH) Interventions SDOH Screenings   Food Insecurity: No Food Insecurity (03/07/2024)  Housing: Low Risk  (03/07/2024)  Transportation Needs: No Transportation Needs (03/07/2024)  Utilities: Not At Risk (03/07/2024)  Social Connections: Unknown (03/07/2024)  Tobacco Use: Medium Risk (03/19/2024)    Readmission Risk Interventions    03/18/2024   12:04 PM  Readmission Risk Prevention Plan  Transportation Screening Complete  PCP or Specialist Appt within 3-5 Days Complete  HRI or Home Care Consult Complete  Social Work Consult for Recovery Care Planning/Counseling Complete  Palliative Care Screening Not Applicable  Medication Review [employer]) Complete

## 2024-04-01 NOTE — Progress Notes (Signed)
 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 03/08/24 transferred to [facility] for initiation of CRRT. 03/09/24 seizure like episode, intubated. 03/15/24 extubated.   OT comments  Chart reviwed to date, pt greeted semi supine in bed, agreeable to OT tx session targeting improving functional activity tolerance in prep for ADL tasks. Pt is agreeable to all treatment tasks provided. Pt amb in room with bari RW approx 15' with CGA. Multiple STS with CGA from regular bed height. He performs grooming tasks in sitting with supervisoin. Pt is making progress towards goals, OT will continue to follow.   VSS on 3L via [place], no reports of dizziness throughout.       If plan is discharge home, recommend the following:  A little help with walking and/or transfers;A little help with bathing/dressing/bathroom   Equipment Recommendations  BSC/3in1 (bari bsc)    Recommendations for Other Services      Precautions / Restrictions Precautions Precautions: Fall Recall of Precautions/Restrictions: Intact Restrictions Weight Bearing Restrictions Per Provider Order: No       Mobility Bed Mobility Overal bed mobility: Needs Assistance Bed Mobility: Supine to Sit, Sit to Supine     Supine to sit: Supervision, HOB elevated, Used rails Sit to supine: Supervision, Used rails   General bed mobility comments: increased time for BLE    Transfers Overall transfer level: Needs assistance Equipment used: Rolling walker (2 wheels) Transfers: Sit to/from Stand Sit to Stand: Contact guard assist  (from regular bed height multiple attempts)                 Balance Overall balance assessment: Needs assistance Sitting-balance support: No upper extremity supported, Feet supported Sitting balance-Leahy Scale: Good     Standing balance support: Bilateral upper extremity supported, During functional activity, Reliant on assistive device for balance Standing balance-Leahy Scale: Fair                             ADL either performed or assessed with clinical judgement   ADL Overall ADL's : Needs assistance/impaired     Grooming: Supervision/safety;Wash/dry face;Sitting               Lower Body Dressing: Maximal assistance Lower Body Dressing Details (indicate cue type and reason): pt reports he does not wear socks at baseline, wears slip in shoes Toilet Transfer: Contact guard assist;Supervision/safety;Rolling walker (2 wheels);Ambulation Toilet Transfer Details (indicate cue type and reason): aimulated         Functional mobility during ADLs: Contact guard assist;Rolling walker (2 wheels) (approx 15' in room)      Extremity/Trunk Assessment              Vision       Perception     Praxis     Communication Communication Communication: Impaired Factors Affecting Communication: Hearing impaired   Cognition Arousal: Alert Behavior During Therapy: WFL for tasks assessed/performed Cognition: No apparent impairments             OT - Cognition Comments: improved cognition, agreeable to all provided tasks  Following commands: Intact, Impaired Following commands impaired: Follows one step commands with increased time      Cueing   Cueing Techniques: Verbal cues, Tactile cues  Exercises Other Exercises Other Exercises: edu re role of OT, importance of continued mobility attempts    Shoulder Instructions       General Comments vss on 3 L via [place] throughout, no dizziness reported    Pertinent Vitals/ Pain        Pain Assessment Pain Assessment: No/denies pain  Home Living                                          Prior Functioning/Environment              Frequency  Min 2X/week        Progress Toward Goals  OT Goals(current goals can now be found in the care plan section)  Progress towards OT goals: Progressing toward goals  Acute Rehab OT Goals Time For Goal Achievement: 04/12/24  Plan      Co-evaluation                 AM-PAC OT 6 Clicks Daily Activity     Outcome Measure   Help from another person eating meals?: None Help from another person taking care of personal grooming?: None Help from another person toileting, which includes using toliet, bedpan, or urinal?: A Little Help from another person bathing (including washing, rinsing, drying)?: A Little Help from another person to put on and taking off regular upper body clothing?: None Help from another person to put on and taking off regular lower body clothing?: A Lot 6 Click Score: 20    End of Session Equipment Utilized During Treatment: Rolling walker (2 wheels);Oxygen  (bari RW)  OT Visit Diagnosis: Other abnormalities of gait and mobility (R26.89);Muscle weakness (generalized) (M62.81)   Activity Tolerance     Patient Left in bed;with call bell/phone within reach;with bed alarm set   Nurse Communication Mobility status        Time: 9047-8991 OT Time Calculation (min): 16 min  Charges: OT General Charges $OT Visit: 1 Visit OT Treatments $Therapeutic Activity: 8-22 mins  Therisa Sheffield, OTD OTR/L  04/01/24, 10:33 AM

## 2024-04-01 NOTE — Progress Notes (Signed)
 Requested to see pt for outpt HD needs at d/c. Introduced self and explained role. Discussed HD options. Pt prefers [facility]. Referral submitted to [facility] for review.  Suzen Satchel Dialysis Navigator [phone]

## 2024-04-01 NOTE — Assessment & Plan Note (Signed)
 Patient with COPD and chronic respiratory failure on oxygen .  BiPAP will help reduce hospitalizations.

## 2024-04-01 NOTE — Progress Notes (Addendum)
 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 He denied any fevers, chills, chest pain.     Of note, the patient was recently admitted to the hospital from 02/01/2024 to 02/05/2024 for respiratory failure due to CHF.  The patient did not require oxygen  at the time of his discharge.  His discharge weight was 286.  At his last office visit with his primary provider his weight was 291 pounds on 02/22/24.  Follow up with pulmonary on 03/04/24 office weight 29.   He was discharged home with furosemide  40 mg daily during last hospitalization.  11/12: Admitted to [facility] to [facility] for treatment of Acute Decompensated HFpEF and AKI. 11/13: BP decreased, started on Levophed .  PCCM consulted.  ABX discontinued. 11/14: Transfer to [facility] for initiation of CRRT.  11/15: seizure like episode, intubated 11/16: Unable to wean; PICC line placed 11/21: Extubated to Bipap. Added Dobutamine .  11/23: Patient remains on bipap this AM. WBC stable at 7.6. H&H Stable. [place] SvO2 67%.  Electrolytes within normal range. NE decreased to 4. Remains on Dobutamin at 5.  11/24: Off BiPAP, awake and alert but remains encephalopathic.  Afebrile, Remains on Levophed , currently 10 mcg, weaning as tolerated.  Creatinine increased to 2.9, but UOP 1.4 L (net - 7.8L), Nephrology holding off on HD today. 11/25: No significant events overnight.  Midodrine  started overnight, now off Levophed .   Precedex  weaned off yesterday, remains encephalopathic but slowly improving. On 4L [place]. Creatinine worsened to 3.6, UOP 675 cc last  24 hrs (net - 8.4L), plan for HD today.  Plan for aggressive pulmonary toilet and mobilization as able.  11/26: Remains critically ill; desatted to the 80s overnight, placed on bipap and developed hypotension requiring initiation of levophed  gtt again. Hypercapnic overnight, will recheck vbg. Nephrology following, placed back on CRRT.  11/27: No acute events overnight, afebrile, remains on Levophed  and CRRT.  Worsening Leukocytosis, obtain Sputum culture and start Linezolid .  Weaned off BiPAP to  11/28: remains on CRRT remains on pressors 12/01: Pt confused this am and refused to keep Bipap on.  Pt stable on 2L O2 via nasal canula no longer requiring vasopressors  03/26/24- patient for permcath placement. He is eating on his own but still behind on calories. He is being moved to [facility] and meds are refined today.  03/27/24- patient s/p permcath today.  He is hypotensive and has severe anemia this am.  Mild electrolyte derrangements which will improve post HD.  12/4.  Medical team taking over.  Patient tolerating dialysis with fluid removal.  Will transfer out of ICU. 12/6.  pCO2 58 on blood gas this morning.  Will be a candidate for noninvasive ventilation at home.  Dialysis today.  Will need outpatient dialysis. 12/7.  Feels okay.  Assessment and Plan: * Acute heart failure with preserved ejection fraction (HFpEF) (HCC) EF greater than 75%, dialysis to help out with fluid management.  Blood pressure too low for other cardiac meds.  Acute kidney injury superimposed on CKD AKI on CKD stage IV.  Patient required continuous dialysis while in the  ICU and now on hemodialysis.  PermCath placed on 12/3.  Nephrology to look into outpatient dialysis units.  Acute on chronic respiratory failure with hypoxia and hypercapnia (HCC) Patient on nasal cannula oxygen  and BiPAP at night.  Patient was intubated on 11/15 and extubated on 11/21.  Blood gas yesterday shows pCO2 of 58.  Patient is a candidate for  noninvasive ventilation and this will decrease hospitalizations.  Patient on 3 L oxygen .  Toxic metabolic encephalopathy Mental status improved likely secondary to uremia  PAF (paroxysmal atrial fibrillation) (HCC) Patient not on any blood thinners or rate controlling medications at this point.  Patient did have hemoptysis and hematuria and anemia.  [facility]) Required pressors during the hospital course.  Now on midodrine  3 times daily.  MRSA pneumonia (HCC) Treated during the hospital course  Anemia due to chronic kidney disease Anemia of chronic disease.  Hemoglobin 8.3  COPD (chronic obstructive pulmonary disease) (HCC) Patient with COPD and chronic respiratory failure on oxygen .  BiPAP will help reduce hospitalizations.  Generalized weakness Physical therapy recommending rehab.  Family would like to take home with home health.  Encourage patient to walk with physical therapy and Occupational Therapy.  Myocardial injury Seen by cardiology early on the hospital course was on heparin  but stopped secondary to hemoptysis and hematuria.  Cardiology will need to follow-up as outpatient.  Blood pressure too low for cardiac meds.        Subjective: Patient feels okay.  Offers no complaints.  States he is urinating okay.  Admitted with CHF exacerbation.  Physical Exam: Vitals:   03/31/24 2009 04/01/24 0426 04/01/24 0433 04/01/24 0841  BP: 121/60 (!) 104/50  (!) 105/53  Pulse: 80 76  70  Resp: 20 20  18   Temp: 97.9 F (36.6 C) 98.3 F (36.8 C)  97.7 F (36.5 C)  TempSrc: Oral Oral  Oral  SpO2: 100% 99%  93%  Weight:   112.4 kg   Height:       Physical Exam HENT:     Head: Normocephalic.     Mouth/Throat:     Pharynx: No oropharyngeal exudate.  Eyes:     General: Lids are normal.     Conjunctiva/sclera: Conjunctivae normal.  Cardiovascular:     Rate and Rhythm: Normal rate and regular rhythm.     Heart sounds: Normal heart sounds, S1 normal and S2 normal.   Pulmonary:     Breath sounds: Examination of the right-lower field reveals decreased breath sounds. Examination of the left-lower field reveals decreased breath sounds. Decreased breath sounds present. No wheezing, rhonchi or rales.  Abdominal:     Palpations: Abdomen is soft.     Tenderness: There is no abdominal tenderness.  Musculoskeletal:     Right lower leg: No swelling.     Left lower leg: No swelling.  Skin:    General: Skin is warm.     Findings: No rash.  Neurological:     Mental Status: He is alert.     Data Reviewed: Creatinine 4.37, hemoglobin 8.3  Family Communication: Spoke with daughter on the phone  Disposition: Status is: Inpatient Remains inpatient appropriate because: Looking into outpatient dialysis slots.  Trying to set up noninvasive ventilation at night.  Planned Discharge Destination: Home with Home Health    Time spent: 28 minutes  Author: Charlie Patterson, MD 04/01/2024 4:38 PM  For on call review [email or web address].

## 2024-04-01 NOTE — Progress Notes (Addendum)
 Speech Language Pathology Treatment: Dysphagia  Patient Details Name: Dave Brown MRN: [id number] DOB: 07-03-1945 Today's Date: 04/01/2024 Time: 8694-8659 SLP Time Calculation (min) (ACUTE ONLY): 35 min  Assessment / Plan / Recommendation Clinical Impression  Pt seen for ongoing assessment of swallowing; toleration of diet and any further Education needs. Pt awake, verbal, and engaged in basic responses/conversation w/ this SLP- he answered basic questions re: self when asked but was vague in his insight and awareness of his medical situation vs his prior functioning in the home. He perseverated on the point that he could swallow 7 pills at one time before but now feels it's difficult to swallow pills w/ water .  Pt has been recommended to swallow pills w/ a PUREE for safer swallowing this admit, and for D/C.  Pt is on [place] O2 2L; wbc wnl, afebrile. Dentures in place.  Pt explained general aspiration precautions and agreed verbally to the need for following them especially sitting upright for all oral intake and taking small sips. Pt was supported behind the back for more full, upright sitting. He fed himself trials of thin liquids given options; no overt clinical s/s of aspiration were noted w/ trials via cup/straw -- respiratory status remained calm and unlabored, vocal quality clear b/t trials, no coughing. Oral phase appeared grossly [facility] for bolus management and timely A-P transfer for swallowing; oral clearing achieved w/ all consistencies.  NSG denied any deficits in swallowing as well when asked about meals- stated pt was doing well.   Pt appears at reduced risk for aspriation when following general aspiration precautions w/ oral intake. Recommend continue a Regular/mech soft diet for ease of soft, cut foods w/ gravies added to moisten foods; Thin liquids. Recommend general aspiration precautions; Pills Whole in Puree for ease/safety of swallowing; tray setup and positioning assistance for  meals. REFLUX precautions.  ST services will sign off at this time w/ MD to reconsult if new needs while admitted. NSG updated. Precautions posted at bedside, chart.        HPI HPI: Pt is a 78 y.o. male with PMHx significant for HFpEF and COPD requiring 2L supplemental oxygen  who is transferred from [facility] where he admitted 11/12 with Acute on Chronic Hypoxic Respiratory Failure in the setting of Acute Decompensated HFpEF, circulatory shock, and AKI requiring initiation of CRRT.   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  On 11/15: seizure like episode, orally intubated in ICU.  Pt was extubated on 11/21.   CXR Imaging on 11/18: Similar small bilateral pleural effusions with bibasilar  opacities, favored to reflect atelectasis.  3. Pulmonary vascular congestion with possible mild interstitial  edema.      SLP Plan  All goals met        Swallow Evaluation Recommendations   Recommendations: PO diet PO Diet Recommendation: Regular;Thin liquids (Level 0) (cut foods; moistened) Liquid Administration via: Cup;Straw (monitor) Medication Administration: Whole meds with puree (for ease and safety of swallowing) Supervision: Patient able to self-feed;Set-up assistance for safety Postural changes: Position pt fully upright for meals;Stay upright 30-60 min after meals;Out of bed for meals (Reflux precs.) Oral care recommendations: Oral care BID (2x/day);Pt independent with oral care;Staff/trained caregiver to provide oral care ([employer] care) Recommended consults: Consider dietitian consultation;Consider Palliative care     Recommendations  Oral care BID;Staff/trained caregiver to provide oral care ([employer] care)   Set up Supervision/Assistance ([employer]) Dysphagia, unspecified (R13.10) (lengthy  illness/hospitalization; Belching; need for support positioning upright for meals/po's; Pulmonary decline Chronic)     All goals met        Dave Portugal, MS, CCC-SLP Speech Language Pathologist Rehab Services; [facility] [place] [phone] (ascom) Dave Brown  04/01/2024, 5:58 PM

## 2024-04-01 NOTE — Progress Notes (Signed)
 [facility] [place], [place] 04/01/24  Subjective:   Hospital day # 35 78 year old male with hypertension, heart failure with preserved ejection fraction, hyperlipidemia, stage IV CKD, obstructive sleep apnea, MGUS, chronic respiratory failure on 2 L oxygen  presented from [facility] with shortness of breath.  Transferred to [facility] for need of continuous dialysis.  Update:   Patient seen sitting at bedside Appears well today Alert and oriented Denies pain  Denies shortness of breath, 3L [place]   Objective:  Vital signs in last 24 hours:  Temp:  [97.7 F (36.5 C)-98.3 F (36.8 C)] 97.7 F (36.5 C) (12/08 0841) Pulse Rate:  [70-90] 70 (12/08 0841) Resp:  [17-20] 18 (12/08 0841) BP: (104-125)/(50-69) 105/53 (12/08 0841) SpO2:  [91 %-100 %] 93 % (12/08 0841) Weight:  [112.4 kg] 112.4 kg (12/08 0433)  Weight change: -6.044 kg Filed Weights   03/30/24 0802 03/30/24 1225 04/01/24 0433  Weight: 118.4 kg 116.5 kg 112.4 kg    Intake/Output:    Intake/Output Summary (Last 24 hours) at 04/01/2024 1501 Last data filed at 04/01/2024 1300 Gross per 24 hour  Intake 720 ml  Output 200 ml  Net 520 ml       Physical Exam: General: NAD  Pulm/lungs Diminished, [place] O2  CVS/Heart S1S2 no rubs  Abdomen:  Distended, nontender  Extremities: No bilateral lower extremity edema  Neurologic: Alert, hard of hearing  Skin: Warm, dry  Access: Rt internal jugular permcath       Basic Metabolic Panel:  Recent Labs  Lab 03/26/24 0510 03/27/24 0427 03/28/24 0520 03/29/24 0458 03/30/24 0900 04/01/24 0538  NA 132* 133* 134* 133* 135 137  K 5.1 5.4* 5.1 4.2 4.5 4.2  CL 99 98 98 96* 98 100  CO2 25 26 26 30 28 26   GLUCOSE 122* 92 89 93 94 109*  BUN 24* 37* 47* 33* 40* 36*  CREATININE 3.23* 4.64* 5.12* 3.79* 4.57* 4.37*  CALCIUM  8.9 9.2 9.0 8.6* 9.0 9.7  MG 2.1 2.3 2.3  --   --   --   PHOS 4.2 5.7* 6.0* 3.9 4.1  --      CBC: Recent Labs  Lab  03/26/24 0510 03/27/24 0427 03/27/24 2056 03/28/24 0520 03/29/24 0458 03/30/24 0900 04/01/24 0538  WBC 18.6* 10.2  --  7.2 6.0 6.2  --   NEUTROABS 16.7* 8.3*  --  5.7 4.2  --   --   HGB 7.1* 6.9* 7.6* 7.5* 7.5* 7.6* 8.3*  HCT 22.0* 21.9* 24.5* 23.5* 23.4* 24.1*  --   MCV 100.5* 103.3*  --  100.9* 100.4* 102.1*  --   PLT 144* 151  --  150 151 176  --       Lab Results  Component Value Date   HEPBSAG NON REACTIVE 03/16/2024      Microbiology:  No results found for this or any previous visit (from the past 240 hours).   Coagulation Studies: No results for input(s): LABPROT, INR in the last 72 hours.   Urinalysis: No results for input(s): COLORURINE, LABSPEC, PHURINE, GLUCOSEU, HGBUR, BILIRUBINUR, KETONESUR, PROTEINUR, UROBILINOGEN, NITRITE, LEUKOCYTESUR in the last 72 hours.  Invalid input(s): APPERANCEUR    Imaging: No results found.       Medications:      vitamin C   500 mg Oral BID   Chlorhexidine  Gluconate Cloth  6 each Topical Q0600   feeding supplement (NEPRO CARB STEADY)  237 mL Oral TID BM   heparin  injection (subcutaneous)  5,000 Units  Subcutaneous Q8H   melatonin  2.5 mg Oral QHS   midodrine   10 mg Oral TID WC   multivitamin  1 tablet Oral QHS   mouth rinse  15 mL Mouth Rinse 4 times per day   pantoprazole   40 mg Oral Daily   sertraline   25 mg Oral Daily   zinc  sulfate (50mg  elemental zinc )  220 mg Oral Daily   docusate sodium , guaiFENesin -dextromethorphan , ipratropium-albuterol , midodrine , ondansetron  (ZOFRAN ) IV, mouth rinse, phenol, polyethylene glycol, sodium chloride , sodium chloride  flush, traZODone   Assessment/ Plan:  78 y.o. male with Obesity, HFpEF and obstructive sleep apnea/COPD requiring 2L supplemental oxygen , hyperlipidemia, chronic kidney disease stage IV, MGUS, hypertension  admitted on 03/08/2024 for Acute and chronic respiratory failure with hypoxia [J96.21] Hypotension [I95.9] Acute on chronic  respiratory failure with hypoxia and hypercapnia (HCC) [G03.78, J96.22]  Acute kidney injury on chronic kidney disease stage IV.  Baseline creatinine of 2.5-2.8. Acute kidney injury is thought to be secondary to pneumonia and circulatory shock causing ATN.  Patient responded well to IV Lasix  03/13/24.  However due to low CVP, further doses were held. CRRT stopped 11/22. First HD 11/23.  Update:  Midodrine  ordered with dialysis. Next treatment scheduled for Tuesday. Outpatient clinic search in progress. Will need to sit in chair on Tuesday for dialysis.   Lab Results  Component Value Date   CREATININE 4.37 (H) 04/01/2024   CREATININE 4.57 (H) 03/30/2024   CREATININE 3.79 (H) 03/29/2024     Intake/Output Summary (Last 24 hours) at 04/01/2024 1501 Last data filed at 04/01/2024 1300 Gross per 24 hour  Intake 720 ml  Output 200 ml  Net 520 ml       Acute respiratory failure Respiratory pathogen's panel by PCR is negative.  Blood cultures from 03/06/2024 are negative. Uses CPAP at night.  Sustaining 2- 3L nasal cannula.  Anemia of CKD.  Hemoglobin 8.2. Will transition retacrit  to 10000 units IV with dialysis..    LOS: [address] Faith Harris 12/8/20253:01 PM  [facility] [address] [place], [place] [phone]

## 2024-04-02 DIAGNOSIS — J439 Emphysema, unspecified: Secondary | ICD-10-CM

## 2024-04-02 LAB — RENAL FUNCTION PANEL
Albumin: 3.5 g/dL (ref 3.5–5.0)
Anion gap: 10 (ref 5–15)
BUN: 41 mg/dL — ABNORMAL HIGH (ref 8–23)
CO2: 27 mmol/L (ref 22–32)
Calcium: 9.9 mg/dL (ref 8.9–10.3)
Chloride: 100 mmol/L (ref 98–111)
Creatinine, Ser: 4.84 mg/dL — ABNORMAL HIGH (ref 0.61–1.24)
GFR, Estimated: 12 mL/min — ABNORMAL LOW (ref >60)
Glucose, Bld: 109 mg/dL — ABNORMAL HIGH (ref 70–99)
Phosphorus: 4.4 mg/dL (ref 2.5–4.6)
Potassium: 4.2 mmol/L (ref 3.5–5.1)
Sodium: 138 mmol/L (ref 135–145)

## 2024-04-02 LAB — CBC
HCT: 26.6 % — ABNORMAL LOW (ref 39.0–52.0)
Hemoglobin: 8.1 g/dL — ABNORMAL LOW (ref 13.0–17.0)
MCH: 32 pg (ref 26.0–34.0)
MCHC: 30.5 g/dL (ref 30.0–36.0)
MCV: 105.1 fL — ABNORMAL HIGH (ref 80.0–100.0)
Platelets: 191 K/uL (ref 150–400)
RBC: 2.53 MIL/uL — ABNORMAL LOW (ref 4.22–5.81)
RDW: 16.7 % — ABNORMAL HIGH (ref 11.5–15.5)
WBC: 9.4 K/uL (ref 4.0–10.5)
nRBC: 0 % (ref 0.0–0.2)

## 2024-04-02 LAB — HEPATITIS B CORE ANTIBODY, TOTAL: HEP B CORE AB: NEGATIVE

## 2024-04-02 MED ORDER — MIDODRINE HCL 5 MG PO TABS
ORAL_TABLET | ORAL | Status: AC
  Filled 2024-04-02: qty 2

## 2024-04-02 MED ORDER — EPOETIN ALFA-EPBX 10000 UNIT/ML IJ SOLN
INTRAMUSCULAR | Status: AC
  Filled 2024-04-02: qty 1

## 2024-04-02 MED ORDER — HEPARIN SODIUM (PORCINE) 1000 UNIT/ML IJ SOLN
INTRAMUSCULAR | Status: AC
  Filled 2024-04-02: qty 5

## 2024-04-02 NOTE — Progress Notes (Signed)
 [facility] [place], [place] 04/02/24  Subjective:   Hospital day # 74 78 year old male with hypertension, heart failure with preserved ejection fraction, hyperlipidemia, stage IV CKD, obstructive sleep apnea, MGUS, chronic respiratory failure on 2 L oxygen  presented from [facility] with shortness of breath.  Transferred to [facility] for need of continuous dialysis.  Update:   Patient seen and evaluated during dialysis   HEMODIALYSIS FLOWSHEET:  Blood Flow Rate (mL/min): 350 mL/min Arterial Pressure (mmHg): -196.55 mmHg Venous Pressure (mmHg): 146.25 mmHg TMP (mmHg): 11.11 mmHg Ultrafiltration Rate (mL/min): 819 mL/min Dialysate Flow Rate (mL/min): 300 ml/min Dialysis Fluid Bolus: Normal Saline Bolus Amount (mL): 300 mL  Tolerating treatment well, seated in chair   Objective:  Vital signs in last 24 hours:  Temp:  [97.9 F (36.6 C)-98.5 F (36.9 C)] 97.9 F (36.6 C) (12/09 0739) Pulse Rate:  [72-82] 74 (12/09 1000) Resp:  [15-25] 19 (12/09 1000) BP: (94-116)/(51-63) 94/51 (12/09 1000) SpO2:  [96 %-100 %] 99 % (12/09 1000) Weight:  [884 kg] 115 kg (12/09 0739)  Weight change:  Filed Weights   03/30/24 1225 04/01/24 0433 04/02/24 0739  Weight: 116.5 kg 112.4 kg 115 kg    Intake/Output:    Intake/Output Summary (Last 24 hours) at 04/02/2024 1023 Last data filed at 04/01/2024 1900 Gross per 24 hour  Intake 440 ml  Output --  Net 440 ml       Physical Exam: General: NAD  Pulm/lungs Clear, [place] O2  CVS/Heart S1S2 no rubs  Abdomen:  Distended, nontender  Extremities: No bilateral lower extremity edema  Neurologic: Alert, hard of hearing  Skin: Warm, dry  Access: Rt internal jugular permcath       Basic Metabolic Panel:  Recent Labs  Lab 03/27/24 0427 03/28/24 0520 03/29/24 0458 03/30/24 0900 04/01/24 0538 04/02/24 0805  NA 133* 134* 133* 135 137 138  K 5.4* 5.1 4.2 4.5 4.2 4.2  CL 98 98 96* 98 100 100  CO2 26 26 30 28 26  27   GLUCOSE 92 89 93 94 109* 109*  BUN 37* 47* 33* 40* 36* 41*  CREATININE 4.64* 5.12* 3.79* 4.57* 4.37* 4.84*  CALCIUM  9.2 9.0 8.6* 9.0 9.7 9.9  MG 2.3 2.3  --   --   --   --   PHOS 5.7* 6.0* 3.9 4.1  --  4.4     CBC: Recent Labs  Lab 03/27/24 0427 03/27/24 2056 03/28/24 0520 03/29/24 0458 03/30/24 0900 04/01/24 0538 04/02/24 0805  WBC 10.2  --  7.2 6.0 6.2  --  9.4  NEUTROABS 8.3*  --  5.7 4.2  --   --   --   HGB 6.9* 7.6* 7.5* 7.5* 7.6* 8.3* 8.1*  HCT 21.9* 24.5* 23.5* 23.4* 24.1*  --  26.6*  MCV 103.3*  --  100.9* 100.4* 102.1*  --  105.1*  PLT 151  --  150 151 176  --  191      Lab Results  Component Value Date   HEPBSAG NON REACTIVE 03/16/2024      Microbiology:  No results found for this or any previous visit (from the past 240 hours).   Coagulation Studies: No results for input(s): LABPROT, INR in the last 72 hours.   Urinalysis: No results for input(s): COLORURINE, LABSPEC, PHURINE, GLUCOSEU, HGBUR, BILIRUBINUR, KETONESUR, PROTEINUR, UROBILINOGEN, NITRITE, LEUKOCYTESUR in the last 72 hours.  Invalid input(s): APPERANCEUR    Imaging: No results found.       Medications:  vitamin C   500 mg Oral BID   Chlorhexidine  Gluconate Cloth  6 each Topical Q0600   epoetin  alfa-epbx (RETACRIT ) injection  10,000 Units Intravenous Q T,Th,Sat-1800   feeding supplement (NEPRO CARB STEADY)  237 mL Oral TID BM   heparin  injection (subcutaneous)  5,000 Units Subcutaneous Q8H   melatonin  2.5 mg Oral QHS   midodrine   10 mg Oral TID WC   multivitamin  1 tablet Oral QHS   mouth rinse  15 mL Mouth Rinse 4 times per day   pantoprazole   40 mg Oral Daily   sertraline   25 mg Oral Daily   zinc  sulfate (50mg  elemental zinc )  220 mg Oral Daily   docusate sodium , guaiFENesin -dextromethorphan , ipratropium-albuterol , midodrine , ondansetron  (ZOFRAN ) IV, mouth rinse, phenol, polyethylene glycol, sodium chloride , sodium chloride  flush,  traZODone   Assessment/ Plan:  78 y.o. 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  Baseline creatinine of 2.5-2.8. Acute kidney injury is thought to be secondary to pneumonia and circulatory shock causing ATN.  Patient responded well to IV Lasix  03/13/24.  However due to low CVP, further doses were held. CRRT stopped 11/22. First HD 11/23.  Update:  Midodrine  ordered with dialysis. Receiving dialysis today, UF 1.5L. Awaiting outpatient clinic acceptance. Next treatment scheduled for Thursday.   Lab Results  Component Value Date   CREATININE 4.84 (H) 04/02/2024   CREATININE 4.37 (H) 04/01/2024   CREATININE 4.57 (H) 03/30/2024     Intake/Output Summary (Last 24 hours) at 04/02/2024 1023 Last data filed at 04/01/2024 1900 Gross per 24 hour  Intake 440 ml  Output --  Net 440 ml       Acute respiratory failure Respiratory pathogen's panel by PCR is negative.  Blood cultures from 03/06/2024 are negative. Uses CPAP at night.  Sustaining 2- 3L nasal cannula. Will likely d/c home with oxygen   Anemia of CKD.  Hemoglobin 8.1. Continue retacrit  to 10000 units IV with dialysis..    LOS: [address] Faith Harris 12/9/202510:23 AM  [address] [place], [place] [phone]

## 2024-04-02 NOTE — Progress Notes (Signed)
 Progress Note   Patient: Dave Brown FMW:[id number] DOB: 06-23-1945 DOA: 03/08/2024     25 DOS: the patient was seen and examined on 04/02/2024   Brief hospital course: 78 y.o male with a past medical history significant for  HFpEF,, hypertension, hyperlipidemia, CKD stage IV, OSA, MGUS (12/21/2011), chronic respiratory failure on 2 L presented to [facility] on 03/06/24 with shortness of breath x 1 day along with mild increase in his lower extremity edema and orthopnea. He denied any fevers, chills, chest pain.     Of note, the patient was recently admitted to the hospital from 02/01/2024 to 02/05/2024 for respiratory failure due to CHF.  The patient did not require oxygen  at the time of his discharge.  His discharge weight was 286.  At his last office visit with his primary provider his weight was 291 pounds on 02/22/24.  Follow up with pulmonary on 03/04/24 office weight 29.   He was discharged home with furosemide  40 mg daily during last hospitalization.  11/12: Admitted to [facility] for treatment of Acute Decompensated HFpEF and AKI. 11/13: BP decreased, started on Levophed .  PCCM consulted.  ABX discontinued. 11/14: Transfer to [facility] for initiation of CRRT.  11/15: seizure like episode, intubated 11/16: Unable to wean; PICC line placed 11/21: Extubated to Bipap. Added Dobutamine .  11/23: Patient remains on bipap this AM. WBC stable at 7.6. H&H Stable. [place] SvO2 67%.  Electrolytes within normal range. NE decreased to 4. Remains on Dobutamin at 5.  11/24: Off BiPAP, awake and alert but remains encephalopathic.  Afebrile, Remains on Levophed , currently 10 mcg, weaning as tolerated.  Creatinine increased to 2.9, but UOP 1.4 L (net - 7.8L), Nephrology holding off on HD today. 11/25: No significant events overnight.  Midodrine  started overnight, now off Levophed .   Precedex  weaned off yesterday, remains encephalopathic but slowly improving. On 4L [place]. Creatinine worsened to 3.6, UOP 675 cc last  24 hrs (net - 8.4L), plan for HD today.  Plan for aggressive pulmonary toilet and mobilization as able.  11/26: Remains critically ill; desatted to the 80s overnight, placed on bipap and developed hypotension requiring initiation of levophed  gtt again. Hypercapnic overnight, will recheck vbg. Nephrology following, placed back on CRRT.  11/27: No acute events overnight, afebrile, remains on Levophed  and CRRT.  Worsening Leukocytosis, obtain Sputum culture and start Linezolid .  Weaned off BiPAP to [place] 11/28: remains on CRRT remains on pressors 12/01: Pt confused this am and refused to keep Bipap on.  Pt stable on 2L O2 via nasal canula no longer requiring vasopressors  03/26/24- patient for permcath placement. He is eating on his own but still behind on calories. He is being moved to [facility] and meds are refined today.  03/27/24- patient s/p permcath today.  He is hypotensive and has severe anemia this am.  Mild electrolyte derrangements which will improve post HD.  12/4.  Medical team taking over.  Patient tolerating dialysis with fluid removal.  Will transfer out of ICU. 12/6.  pCO2 58 on blood gas this morning.  Will be a candidate for noninvasive ventilation at home.  Dialysis today.  Will need outpatient dialysis. 12/7.  Feels okay. 12/9.  Patient seen on dialysis.  Ready to get out of the hospital.  BiPAP to be approved.  Has home oxygen  already.  Awaiting for outpatient dialysis slot to be financially approved.  Assessment and Plan: * Acute heart failure with preserved ejection fraction (HFpEF) (HCC) EF greater than 75%, dialysis to  help out with fluid management.  Blood pressure too low for other cardiac meds.  Acute kidney injury superimposed on CKD AKI on CKD stage IV.  Patient required continuous dialysis while in the ICU and now on hemodialysis.  PermCath placed on 12/3.  Nephrology to look into outpatient dialysis units.  Acute on chronic respiratory failure with hypoxia and hypercapnia  (HCC) Patient on nasal cannula oxygen  and BiPAP at night.  Patient was intubated on 11/15 and extubated on 11/21.  Blood gas yesterday other day shows pCO2 of 58.  Patient is a candidate for noninvasive ventilation and this will decrease hospitalizations.  Patient on 2 L oxygen .  Toxic metabolic encephalopathy Mental status improved likely secondary to uremia  PAF (paroxysmal atrial fibrillation) (HCC) Patient not on any blood thinners or rate controlling medications at this point.  Patient did have hemoptysis and hematuria and anemia during the hospital course.  [facility]) Required pressors during the hospital course.  Now on midodrine  3 times daily.  MRSA pneumonia (HCC) Treated during the hospital course  Anemia due to chronic kidney disease Anemia of chronic disease.  Hemoglobin 8.1  COPD (chronic obstructive pulmonary disease) (HCC) Patient with COPD and chronic respiratory failure on oxygen .  BiPAP will help reduce hospitalizations.  Generalized weakness Physical therapy recommending rehab.  Family would like to take home with home health.   Myocardial injury Seen by cardiology early on the hospital course was on heparin  but stopped secondary to hemoptysis and hematuria.  Cardiology will need to follow-up as outpatient.  Blood pressure too low for cardiac meds.        Subjective: Patient feels okay.  Offers no complaints.  Wanting to get out of the hospital.  Admitted with CHF exacerbation.  Physical Exam: Vitals:   04/02/24 1100 04/02/24 1104 04/02/24 1213 04/02/24 1536  BP: (!) 106/51 (!) 99/53 107/75 113/65  Pulse: 78 81 79 78  Resp: (!) 25 (!) 25 19 (!) 21  Temp:  98 F (36.7 C) 97.6 F (36.4 C) 97.8 F (36.6 C)  TempSrc:  Oral    SpO2: 99% 100% 99% 99%  Weight:  114 kg    Height:       Physical Exam HENT:     Head: Normocephalic.     Mouth/Throat:     Pharynx: No oropharyngeal exudate.  Eyes:     General: Lids are normal.     Conjunctiva/sclera:  Conjunctivae normal.  Cardiovascular:     Rate and Rhythm: Normal rate and regular rhythm.     Heart sounds: Normal heart sounds, S1 normal and S2 normal.  Pulmonary:     Breath sounds: Examination of the right-lower field reveals decreased breath sounds. Examination of the left-lower field reveals decreased breath sounds. Decreased breath sounds present. No wheezing, rhonchi or rales.  Abdominal:     Palpations: Abdomen is soft.     Tenderness: There is no abdominal tenderness.  Musculoskeletal:     Right lower leg: No swelling.     Left lower leg: No swelling.  Skin:    General: Skin is warm.     Findings: No rash.  Neurological:     Mental Status: He is alert.     Data Reviewed: Creatinine 4.84, hemoglobin 8.1  Family Communication: Spoke with daughter on the phone  Disposition: Status is: Inpatient Remains inpatient appropriate because: Getting noninvasive ventilation approved for nighttime.  Still awaiting outpatient dialysis slot to be financially approved.  Planned Discharge Destination: Home with Home Health (  refused rehab)    Time spent: 28 minutes  Author: Charlie Patterson, MD 04/02/2024 4:40 PM  For on call review [email or web address].

## 2024-04-02 NOTE — Progress Notes (Signed)
 Physical Therapy Treatment Patient Details Name: Dave Brown MRN: [id number] DOB: [birth year] Today's Date: 04/02/2024   History of Present Illness Dave Brown is a 78 y.o male with a past medical history significant for  HFpEF,, hypertension, hyperlipidemia, CKD stage IV, OSA, MGUS (12/21/2011), chronic respiratory failure on 2 L presented to [facility] on 03/06/24 with shortness of breath x 1 day along with mild increase in his lower extremity edema and orthopnea. 03/08/24 transferred to [facility] for initiation of CRRT. 03/09/24 seizure like episode, intubated. 03/15/24 extubated.    PT Comments  Pt returned from dialysis and agreed to gait.  Stated he does get up on his own and walk around the bed at times and to [facility] without incident.  He is able to get OOB and complete x 2 laps in room with RW and cga x 1.  No LOB or buckling but does fatigue.  Good safety.  Overall tolerates well but opts to return to bed to rest.  Sats 98% on 2 lpm support.   If plan is discharge home, recommend the following: A little help with walking and/or transfers;A little help with bathing/dressing/bathroom;Assistance with cooking/housework;Help with stairs or ramp for entrance;Assist for transportation   Can travel by private vehicle     No  Equipment Recommendations  None recommended by PT    Recommendations for Other Services       Precautions / Restrictions Precautions Precautions: Fall Recall of Precautions/Restrictions: Impaired Restrictions Weight Bearing Restrictions Per Provider Order: No     Mobility  Bed Mobility Overal bed mobility: Modified Independent       Supine to sit: Used rails, HOB elevated Sit to supine: HOB elevated, Used rails   General bed mobility comments: increased time for BLE    Transfers Overall transfer level: Needs assistance Equipment used: Rolling walker (2 wheels) Transfers: Sit to/from Stand Sit to Stand: Contact guard assist                 Ambulation/Gait Ambulation/Gait assistance: Contact guard assist Gait Distance (Feet): 40 Feet Assistive device: Rolling walker (2 wheels) Gait Pattern/deviations: Step-through pattern Gait velocity: dec     General Gait Details: generally steady but limited by fatigue.  had dialysis this am   Stairs             Wheelchair Mobility     Tilt Bed    Modified Rankin (Stroke Patients Only)       Balance                                            Communication Communication Communication: Impaired  Cognition Arousal: Alert Behavior During Therapy: WFL for tasks assessed/performed   PT - Cognitive impairments: Memory Difficult to assess due to: Hard of hearing/deaf                       Following commands: Intact, Impaired Following commands impaired: Follows one step commands with increased time    Cueing Cueing Techniques: Verbal cues, Tactile cues  Exercises      General Comments        Pertinent Vitals/Pain Pain Assessment Pain Assessment: No/denies pain    Home Living                          Prior Function  PT Goals (current goals can now be found in the care plan section) Progress towards PT goals: Progressing toward goals    Frequency    Min 2X/week      PT Plan      Co-evaluation              AM-PAC PT 6 Clicks Mobility   Outcome Measure  Help needed turning from your back to your side while in a flat bed without using bedrails?: None Help needed moving from lying on your back to sitting on the side of a flat bed without using bedrails?: None Help needed moving to and from a bed to a chair (including a wheelchair)?: None Help needed standing up from a chair using your arms (e.g., wheelchair or bedside chair)?: None Help needed to walk in hospital room?: A Little Help needed climbing 3-5 steps with a railing? : A Little 6 Click Score: 22    End of Session Equipment  Utilized During Treatment: Oxygen  Activity Tolerance: Patient tolerated treatment well Patient left: in bed;with call bell/phone within reach Nurse Communication: Mobility status PT Visit Diagnosis: Muscle weakness (generalized) (M62.81);Unsteadiness on feet (R26.81)     Time: 8690-8681 PT Time Calculation (min) (ACUTE ONLY): 9 min  Charges:    $Gait Training: 8-22 mins PT General Charges $$ ACUTE PT VISIT: 1 Visit                   Lauraine Gills, PTA 04/02/24, 1:39 PM

## 2024-04-02 NOTE — TOC Progression Note (Signed)
 Transition of Care [facility]) - Progression Note    Patient Details  Name: Braxdon Gappa MRN: [id number] Date of Birth: March 17, 1946  [facility] CM/SW Contact  Corean ONEIDA Haddock, RN Phone Number: 04/02/2024, 3:04 PM  Clinical Narrative:    Thomasina with [employer] notified that update note in Per Rian Satchel HD coordinator, outpatient HD placement still pending    Expected Discharge Plan: Home w Home Health Services Barriers to Discharge: Continued Medical Work up               Expected Discharge Plan and Services   Discharge Planning Services: CM Consult   Living arrangements for the past 2 months: Single Family Home                                       Social Drivers of Health (SDOH) Interventions SDOH Screenings   Food Insecurity: No Food Insecurity (03/07/2024)  Housing: Low Risk  (03/07/2024)  Transportation Needs: No Transportation Needs (03/07/2024)  Utilities: Not At Risk (03/07/2024)  Social Connections: Unknown (03/07/2024)  Tobacco Use: Medium Risk (03/19/2024)    Readmission Risk Interventions    03/18/2024   12:04 PM  Readmission Risk Prevention Plan  Transportation Screening Complete  PCP or Specialist Appt within 3-5 Days Complete  HRI or Home Care Consult Complete  Social Work Consult for Recovery Care Planning/Counseling Complete  Palliative Care Screening Not Applicable  Medication Review [employer]) Complete

## 2024-04-02 NOTE — Progress Notes (Signed)
 Hemodialysis Note:  Received patient in bed to unit. Alert and oriented. Informed consent singed and in chart.  Treatment initiated: 0755 Treatment completed: 1104  Access used: Right internal jugular catheter Access issues: Rinsed back 39 minutes early due to system clot. HD NP made aware  Patient tolerated well. Transported back to room, alert without acute distress. Report given to patient's RN.  Total UF removed: 900 ml Medications given: Retacrit  10000 units IV, Midodrine  10 mg IV  Post HD weight: 114 Kg  [facility]

## 2024-04-02 NOTE — Progress Notes (Signed)
 Due to the severity of the patient's chronic respiratory failure, secondary to COPD, the patient requires volume targeted pressure support from Home Mechanical Ventilation. OSA is not the predominant cause of their hypercapnia, and HMV is not being ordered to treat OSA. RAD with or without back up rate does not supply volume targeted ventilation to manage the patients Hypercapnia. BIPAP, BIPAP ST/ STA (RAD) have been ruled out. The patient requires ventilatory support for more than eight hours per 24-hour period to normalize the patient's PaCO2 and prevent readmissions. Failure to adequately ventilate will result in serious harm and/or death to the patient   Dr Charlie Patterson

## 2024-04-03 ENCOUNTER — Other Ambulatory Visit: Payer: Self-pay

## 2024-04-03 DIAGNOSIS — H9190 Unspecified hearing loss, unspecified ear: Secondary | ICD-10-CM

## 2024-04-03 DIAGNOSIS — N186 End stage renal disease: Secondary | ICD-10-CM

## 2024-04-03 MED ORDER — TRAZODONE HCL 50 MG PO TABS
50.0000 mg | ORAL_TABLET | Freq: Every evening | ORAL | 0 refills | Status: DC | PRN
  Filled 2024-04-03: qty 30, 30d supply, fill #0

## 2024-04-03 MED ORDER — DEXTROMETHORPHAN-GUAIFENESIN 20-200 MG/20ML PO LIQD
10.0000 mL | ORAL | 0 refills | Status: DC | PRN
  Filled 2024-04-03: qty 118, 1d supply, fill #0

## 2024-04-03 MED ORDER — NEPRO/CARBSTEADY PO LIQD
237.0000 mL | Freq: Three times a day (TID) | ORAL | 0 refills | Status: DC
  Filled 2024-04-03: qty 1000, 2d supply, fill #0
  Filled 2024-04-06: qty 948, 2d supply, fill #0

## 2024-04-03 MED ORDER — RENA-VITE PO TABS
1.0000 | ORAL_TABLET | Freq: Every day | ORAL | 1 refills | Status: DC
  Filled 2024-04-03: qty 30, 30d supply, fill #0

## 2024-04-03 MED ORDER — ENSURE PLUS HIGH PROTEIN PO LIQD
237.0000 mL | Freq: Three times a day (TID) | ORAL | Status: DC
  Administered 2024-04-04 – 2024-04-05 (×3): 237 mL via ORAL

## 2024-04-03 MED ORDER — IPRATROPIUM-ALBUTEROL 0.5-2.5 (3) MG/3ML IN SOLN
3.0000 mL | Freq: Four times a day (QID) | RESPIRATORY_TRACT | 1 refills | Status: DC | PRN
  Filled 2024-04-03: qty 360, 30d supply, fill #0

## 2024-04-03 MED ORDER — ZINC SULFATE 220 (50 ZN) MG PO TABS
220.0000 mg | ORAL_TABLET | Freq: Every day | ORAL | 0 refills | Status: DC
  Filled 2024-04-03: qty 30, 30d supply, fill #0

## 2024-04-03 MED ORDER — MIDODRINE HCL 10 MG PO TABS
10.0000 mg | ORAL_TABLET | ORAL | 1 refills | Status: DC | PRN
  Filled 2024-04-03: qty 30, 30d supply, fill #0

## 2024-04-03 NOTE — Progress Notes (Signed)
 Pt accepted at [facility] on TTS at 10:30am. Pt can start 04/09/24 and will need to arrive at 9:30am for new patient paperwork.                      SABRA Suzen Satchel Dialysis Navigator [phone]

## 2024-04-03 NOTE — Progress Notes (Addendum)
 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 He denied any fevers, chills, chest pain.     Of note, the patient was recently admitted to the hospital from 02/01/2024 to 02/05/2024 for respiratory failure due to CHF.  The patient did not require oxygen  at the time of his discharge. His discharge weight was 286.  At his last office visit with his primary provider his weight was 291 pounds on 02/22/24.  Follow up with pulmonary on 03/04/24 office weight 297.   He was discharged home with furosemide  40 mg daily during last hospitalization.  11/12: Admitted to [facility] to [place] for treatment of Acute Decompensated HFpEF and AKI. 11/13: BP decreased, started on Levophed .  PCCM consulted.  ABX discontinued. 11/14: Transfer to [facility] for initiation of CRRT.  11/15: seizure like episode, intubated 11/16: Unable to wean; PICC line placed 11/21: Extubated to Bipap. Added Dobutamine .  11/23: Patient remains on bipap this AM. WBC stable at 7.6. H&H Stable. [place] SvO2 67%.  Electrolytes within normal range. NE decreased to 4. Remains on Dobutamin at 5.  11/24: Off BiPAP, awake and alert but remains encephalopathic.  Afebrile, Remains on Levophed , currently 10 mcg, weaning as tolerated.  Creatinine increased to 2.9, but UOP 1.4 L (net - 7.8L), Nephrology holding off on HD today. 11/25: No significant events overnight.  Midodrine  started overnight, now off Levophed .   Precedex  weaned off yesterday, remains encephalopathic but slowly improving. On 4L [place]. Creatinine worsened to 3.6, UOP 675 cc last 24 hrs (net - 8.4L), plan for HD today.  Plan for aggressive pulmonary toilet and  mobilization as able.  11/26: Remains critically ill; desatted to the 80s overnight, placed on bipap and developed hypotension requiring initiation of levophed  gtt again. Hypercapnic overnight, will recheck vbg. Nephrology following, placed back on CRRT.  11/27: No acute events overnight, afebrile, remains on Levophed  and CRRT.  Worsening Leukocytosis, obtain Sputum culture and start Linezolid .  Weaned off BiPAP to [place] 11/28: remains on CRRT remains on pressors 12/01: Pt confused this am and refused to keep Bipap on.  Pt stable on 2L O2 via nasal canula no longer requiring vasopressors  03/26/24- patient for permcath placement. He is eating on his own but still behind on calories. He is being moved to [facility] and meds are refined today.  03/27/24- patient s/p permcath today.  He is hypotensive and has severe anemia this am.  Mild electrolyte derrangements which will improve post HD.  12/4.  Medical team taking over.  Patient tolerating dialysis with fluid removal.  Will transfer out of ICU. 12/6.  pCO2 58 on blood gas this morning.  Will be a candidate for noninvasive ventilation at home.  Dialysis today.  Will need outpatient dialysis. 12/7.  Feels okay. 12/9.  Patient seen on dialysis.  Ready to get out of the hospital.  BiPAP to be approved.  Has home oxygen  already.  Awaiting for outpatient dialysis slot to be financially approved.  12/10: I assumed care of the patient.  Respiratory care team consulted for NIV/HMV setting at home.  Hemodialysis bed available and offered, [facility] at 10:30a..  Patient is ready for  discharged however Dialysis coordinator states due to weather, patient will not be able to start HD until 12/16. Discharge orders canceled.  NIV setting: IPAP OF 18/PEEP OF 8,RR 12,FIO2 35%, tidal volume is 500 ml. TOC aware of the settings.  Assessment & Plan:   Principal Problem:   Acute heart failure with preserved ejection fraction (HFpEF) (HCC) Active Problems:   MRSA  pneumonia (HCC)   Anemia due to chronic kidney disease   Shock (HCC)   Acute kidney injury superimposed on CKD   Acute on chronic respiratory failure with hypoxia and hypercapnia (HCC)   Toxic metabolic encephalopathy   PAF (paroxysmal atrial fibrillation) (HCC)   Sleep apnea   Myocardial injury   Encounter for dialysis and dialysis catheter care   Generalized weakness   COPD (chronic obstructive pulmonary disease) (HCC)   ESRD on dialysis (HCC)   HOH (hard of hearing)   Assessment and Plan:  * Acute heart failure with preserved ejection fraction (HFpEF) (HCC) EF greater than 75%, dialysis to help out with fluid management.  Blood pressure too low for other cardiac meds.  Anemia due to chronic kidney disease Anemia of chronic disease.  Hemoglobin 8.1 HD with hematocrit per nephrology  MRSA pneumonia [facility]) Completed course during the hospitalization  Shock [facility]) Required pressors during the hospital course.  Now on midodrine  3 times daily.  Acute kidney injury superimposed on CKD AKI on CKD stage IV.  Patient required continuous dialysis while in the ICU and now on hemodialysis.  PermCath placed on 12/3.  Nephrology to look into outpatient dialysis units. 12/10: now on hemodialysis  Acute on chronic respiratory failure with hypoxia and hypercapnia (HCC) Patient on nasal cannula oxygen  and BiPAP at night.  Patient was intubated on 11/15 and extubated on 11/21.  Blood gas yesterday other day shows pCO2 of 58.  Patient is a candidate for noninvasive ventilation and this will decrease hospitalizations.  Patient on 2 L oxygen .  Toxic metabolic encephalopathy Mental status improved likely secondary to uremia  PAF (paroxysmal atrial fibrillation) (HCC) Patient not on any blood thinners or rate controlling medications at this point.  Patient did have hemoptysis and hematuria and anemia during the hospital course.  HOH (hard of hearing) Bilaterally, at baseline  ESRD on dialysis  [facility]) Via Permcath   COPD (chronic obstructive pulmonary disease) (HCC) Patient with COPD and chronic respiratory failure on oxygen .  BiPAP will help reduce hospitalizations.  12/10 patient requires full mechanical ventilation/noninvasive ventilator therapy.  At Is not able to come to patient's home to help set this up.  Adapt/respiratory therapist can show family how to hook it up and set up at the hospital and family can do this at home.  Respiratory consulted for ventilator setting.  Addendum: per respiratory IPAP of 18/PEEP of 8/RR 12/FiO2 35%/tidal volume 500 mL  Generalized weakness With balance difficulty Physical therapy recommending rehab.  Family would like to take home with home health.  12/10: Home health PT, OT ordered, face-to-face  Myocardial injury Seen by cardiology early on the hospital course was on heparin  but stopped secondary to hemoptysis and hematuria.  Cardiology will need to follow-up as outpatient.  Blood pressure too low for cardiac meds.  DVT prophylaxis: Heparin  5000 units subcutaneous every 8 hours Code Status: Full code Family Communication: No Disposition Plan: Patient is currently ready for discharge.  [facility] hemodialysis bed has been offered for Tuesday Thursday Saturday at 10:30 AM.  However given weather, patient will not be able to initiate dialysis  outpatient until 12/16. Level of care: Med-Surg  Consultants:  Nephrology, PCCM, TOC, PT, OT  Procedures:  PermCath placement  Antimicrobials: Completed antibiotic course during hospitalization  Subjective:  At bedside, patient was able to tell me his first and last name, his age, the current location and current calendar year.  Objective: Vitals:   04/02/24 2040 04/03/24 0413 04/03/24 0500 04/03/24 0745  BP: 125/69 112/65  (!) 100/57  Pulse: 70 73  72  Resp: 18 18  18   Temp: 97.8 F (36.6 C) 98 F (36.7 C)  97.8 F (36.6 C)  TempSrc:    Oral  SpO2: 100% 98%  99%  Weight:   112.8 kg    Height:        Intake/Output Summary (Last 24 hours) at 04/03/2024 1625 Last data filed at 04/03/2024 0900 Gross per 24 hour  Intake 240 ml  Output --  Net 240 ml   Filed Weights   04/02/24 0739 04/02/24 1104 04/03/24 0500  Weight: 115 kg 114 kg 112.8 kg   Examination:  General exam: Appears calm and comfortable.  Respiratory system: Clear to auscultation. Respiratory effort normal. Cardiovascular system: S1 & S2 heard, RRR. No JVD, murmurs, rubs, gallops or clicks. No pedal edema. Gastrointestinal system: Morbidly obese abdomen, abdomen is nondistended, soft and nontender. No organomegaly or masses felt. Normal bowel sounds heard. Central nervous system: Alert and oriented. No focal neurological deficits. Extremities: Symmetric 5 x 5 power. Skin: No rashes, lesions or ulcers Psychiatry: Judgement and insight appear normal. Mood & affect appropriate.   Data Reviewed: I have personally reviewed following labs and imaging studies  CBC: Recent Labs  Lab 03/27/24 2056 03/28/24 0520 03/29/24 0458 03/30/24 0900 04/01/24 0538 04/02/24 0805  WBC  --  7.2 6.0 6.2  --  9.4  NEUTROABS  --  5.7 4.2  --   --   --   HGB 7.6* 7.5* 7.5* 7.6* 8.3* 8.1*  HCT 24.5* 23.5* 23.4* 24.1*  --  26.6*  MCV  --  100.9* 100.4* 102.1*  --  105.1*  PLT  --  150 151 176  --  191   Basic Metabolic Panel: Recent Labs  Lab 03/28/24 0520 03/29/24 0458 03/30/24 0900 04/01/24 0538 04/02/24 0805  NA 134* 133* 135 137 138  K 5.1 4.2 4.5 4.2 4.2  CL 98 96* 98 100 100  CO2 26 30 28 26 27   GLUCOSE 89 93 94 109* 109*  BUN 47* 33* 40* 36* 41*  CREATININE 5.12* 3.79* 4.57* 4.37* 4.84*  CALCIUM  9.0 8.6* 9.0 9.7 9.9  MG 2.3  --   --   --   --   PHOS 6.0* 3.9 4.1  --  4.4   GFR: Estimated Creatinine Clearance: 15.6 mL/min (A) (by C-G formula based on SCr of 4.84 mg/dL (H)).  Liver Function Tests: Recent Labs  Lab 03/30/24 0900 04/02/24 0805  ALBUMIN 3.2* 3.5   BNP (last 3 results) Recent  Labs    02/01/24 1532 03/06/24 2129  PROBNP 6,941.0* 16,139.0*   CBG: Recent Labs  Lab 03/28/24 0732 03/28/24 1142 03/29/24 1549 03/29/24 2217 03/30/24 2032  GLUCAP 88 95 116* 103* 110*   Scheduled Meds:  vitamin C   500 mg Oral BID   Chlorhexidine  Gluconate Cloth  6 each Topical Q0600   epoetin  alfa-epbx (RETACRIT ) injection  10,000 Units Intravenous Q T,Th,Sat-1800   feeding supplement (NEPRO CARB STEADY)  237 mL Oral TID BM   heparin  injection (subcutaneous)  5,000 Units Subcutaneous  Q8H   melatonin  2.5 mg Oral QHS   midodrine   10 mg Oral TID WC   multivitamin  1 tablet Oral QHS   mouth rinse  15 mL Mouth Rinse 4 times per day   pantoprazole   40 mg Oral Daily   sertraline   25 mg Oral Daily   zinc  sulfate (50mg  elemental zinc )  220 mg Oral Daily    LOS: 26 days   Time spent: 50 minutes  Dr. Sherre [facility] If 7PM-7AM, please contact night-coverage 04/03/2024, 4:25 PM

## 2024-04-03 NOTE — Assessment & Plan Note (Signed)
 Via Permcath

## 2024-04-03 NOTE — Assessment & Plan Note (Signed)
 Bilaterally, at baseline

## 2024-04-03 NOTE — Progress Notes (Signed)
 Nutrition Follow-up  DOCUMENTATION CODES:   Obesity unspecified  INTERVENTION:   Ensure Plus High Protein po TID, each supplement provides 350 kcal and 20 grams of protein  Magic cup TID with meals, each supplement provides 290 kcal and 9 grams of protein  Liberal diet   Vitamin C  500mg  po BID x 30 days   Zinc  220mg  po daily x 30 days   Rena-vit daily po daily   Daily weights   NUTRITION DIAGNOSIS:   Increased nutrient needs related to acute illness (CRRT) as evidenced by estimated needs. -ongoing   GOAL:   Patient will meet greater than or equal to 90% of their needs -progressing   MONITOR:   PO intake, Supplement acceptance, Labs, Weight trends, I & O's, Skin  ASSESSMENT:   78 y/o male with h/o MGUS, OSA, CKD IV, DJD, HTN, CHF, kidney stones and HLD who is admitted with CHF, volume overload, cardiogenic shock and AKI requiring CRRT initiation 11/14.  Met with pt in room today. Pt sitting up in chair with family at bedside. Pt is anxious to go home. Pt with improved appetite and oral intake in hospital; pt eating 100% of meals and is eating the Magic Cups. Pt reports that he is no longer drinking the Nepro as it gave him diarrhea but he is agreeable to switch to Ensure. Per chart, pt is down 69lbs since admission and is now down 38lbs from his UBW. RD unsure how much weight loss is from lean body mass vs fluid changes. Pt looks similar on exam today from last week. Edema is improved. RD discussed with pt the importance of adequate nutrition needed to preserve lean muscle and replace losses from HD. Ensure coupons provided today. Pt with vitamin C  and zinc  deficiencies and will need to continue supplementation after discharge and follow up with PCP to have levels rechecked; this was discussed with pt and family today.    Medications reviewed and include: vitamin C , epoetin , heparin , melatonin, midodrine , rena-vit, protonix , zinc    Labs reviewed: K 4.2 wnl, BUN 41(H), creat  4.84(H), P 4.4 wnl- 12/9 Folate 8.0 wnl, B12 276 wnl, copper  100 wnl, B6 5.1 wnl, vitamin C  0.2(L), zinc  42(L)- 11/22 Hgb 8.1(L), Hct 26.6(L)- 12/9  Nutrition Focused Physical Exam:  Flowsheet Row Most Recent Value  Orbital Region No depletion  Upper Arm Region No depletion  Thoracic and Lumbar Region No depletion  Buccal Region No depletion  Temple Region No depletion  Clavicle Bone Region Mild depletion  Clavicle and Acromion Bone Region Mild depletion  Scapular Bone Region Mild depletion  Dorsal Hand No depletion  Patellar Region No depletion  Anterior Thigh Region No depletion  Posterior Calf Region No depletion  Edema (RD Assessment) Mild  Hair Reviewed  Eyes Reviewed  Mouth Reviewed  Skin Reviewed  Nails Reviewed   Diet Order:    Diet Order             Diet - low sodium heart healthy           Diet regular Room service appropriate? Yes with Assist; Fluid consistency: Thin  Diet effective now                  EDUCATION NEEDS:   Not appropriate for education at this time  Skin:  Skin Assessment: Reviewed RN Assessment (blister L buttocks)  Last BM:  12/10- type 5  Height:   Ht Readings from Last 1 Encounters:  03/27/24 5' 9 (1.753 m)  Weight:   Wt Readings from Last 1 Encounters:  04/03/24 112.8 kg    Ideal Body Weight:  72.7 kg  BMI:  Body mass index is 36.72 kg/m.  Estimated Nutritional Needs:   Kcal:  2700-3000kcal/day  Protein:  >135g/day  Fluid:  1.8-2.1L/day  Augustin Shams MS, RD, LDN If unable to be reached, please send secure chat to RD inpatient available from 8:00a-4:00p daily

## 2024-04-03 NOTE — Progress Notes (Signed)
 [facility] [place], [place] 04/03/24  Subjective:   Hospital day # 43 78 year old male with hypertension, heart failure with preserved ejection fraction, hyperlipidemia, stage IV CKD, obstructive sleep apnea, MGUS, chronic respiratory failure on 2 L oxygen  presented from [facility] with shortness of breath.  Transferred to [facility] for need of continuous dialysis.  Update:   Patient seen sitting up in chair States he's ready for discharge Weaned to room air  Objective:  Vital signs in last 24 hours:  Temp:  [97.6 F (36.4 C)-98 F (36.7 C)] 97.8 F (36.6 C) (12/10 0745) Pulse Rate:  [70-81] 72 (12/10 0745) Resp:  [18-25] 18 (12/10 0745) BP: (99-125)/(53-75) 100/57 (12/10 0745) SpO2:  [98 %-100 %] 99 % (12/10 0745) Weight:  [112.8 kg-114 kg] 112.8 kg (12/10 0500)  Weight change:  Filed Weights   04/02/24 0739 04/02/24 1104 04/03/24 0500  Weight: 115 kg 114 kg 112.8 kg    Intake/Output:    Intake/Output Summary (Last 24 hours) at 04/03/2024 1100 Last data filed at 04/03/2024 0900 Gross per 24 hour  Intake 240 ml  Output 900 ml  Net -660 ml       Physical Exam: General: NAD  Pulm/lungs Clear, [place] O2  CVS/Heart S1S2 no rubs  Abdomen:  Distended, nontender  Extremities: No bilateral lower extremity edema  Neurologic: Alert, hard of hearing  Skin: Warm, dry  Access: Rt internal jugular permcath       Basic Metabolic Panel:  Recent Labs  Lab 03/28/24 0520 03/29/24 0458 03/30/24 0900 04/01/24 0538 04/02/24 0805  NA 134* 133* 135 137 138  K 5.1 4.2 4.5 4.2 4.2  CL 98 96* 98 100 100  CO2 26 30 28 26 27   GLUCOSE 89 93 94 109* 109*  BUN 47* 33* 40* 36* 41*  CREATININE 5.12* 3.79* 4.57* 4.37* 4.84*  CALCIUM  9.0 8.6* 9.0 9.7 9.9  MG 2.3  --   --   --   --   PHOS 6.0* 3.9 4.1  --  4.4     CBC: Recent Labs  Lab 03/27/24 2056 03/28/24 0520 03/29/24 0458 03/30/24 0900 04/01/24 0538 04/02/24 0805  WBC  --  7.2 6.0 6.2  --   9.4  NEUTROABS  --  5.7 4.2  --   --   --   HGB 7.6* 7.5* 7.5* 7.6* 8.3* 8.1*  HCT 24.5* 23.5* 23.4* 24.1*  --  26.6*  MCV  --  100.9* 100.4* 102.1*  --  105.1*  PLT  --  150 151 176  --  191      Lab Results  Component Value Date   HEPBSAG NON REACTIVE 03/16/2024      Microbiology:  No results found for this or any previous visit (from the past 240 hours).   Coagulation Studies: No results for input(s): LABPROT, INR in the last 72 hours.   Urinalysis: No results for input(s): COLORURINE, LABSPEC, PHURINE, GLUCOSEU, HGBUR, BILIRUBINUR, KETONESUR, PROTEINUR, UROBILINOGEN, NITRITE, LEUKOCYTESUR in the last 72 hours.  Invalid input(s): APPERANCEUR    Imaging: No results found.       Medications:      vitamin C   500 mg Oral BID   Chlorhexidine  Gluconate Cloth  6 each Topical Q0600   epoetin  alfa-epbx (RETACRIT ) injection  10,000 Units Intravenous Q T,Th,Sat-1800   feeding supplement (NEPRO CARB STEADY)  237 mL Oral TID BM   heparin  injection (subcutaneous)  5,000 Units Subcutaneous Q8H   melatonin  2.5 mg Oral QHS  midodrine   10 mg Oral TID WC   multivitamin  1 tablet Oral QHS   mouth rinse  15 mL Mouth Rinse 4 times per day   pantoprazole   40 mg Oral Daily   sertraline   25 mg Oral Daily   zinc  sulfate (50mg  elemental zinc )  220 mg Oral Daily   docusate sodium , guaiFENesin -dextromethorphan , ipratropium-albuterol , midodrine , ondansetron  (ZOFRAN ) IV, mouth rinse, phenol, polyethylene glycol, sodium chloride , sodium chloride  flush, traZODone   Assessment/ Plan:  78 y.o. male with Obesity, HFpEF and obstructive sleep apnea/COPD requiring 2L supplemental oxygen , hyperlipidemia, chronic kidney disease stage IV, MGUS, hypertension  admitted on 03/08/2024 for Acute and chronic respiratory failure with hypoxia [J96.21] Hypotension [I95.9] Acute on chronic respiratory failure with hypoxia and hypercapnia (HCC) [G03.78, J96.22]  Acute  kidney injury on chronic kidney disease stage IV.  Baseline creatinine of 2.5-2.8. Acute kidney injury is thought to be secondary to pneumonia and circulatory shock causing ATN.  Patient responded well to IV Lasix  03/13/24.  However due to low CVP, further doses were held. CRRT stopped 11/22. First HD 11/23.  Update:  Midodrine  available with dialysis. Next treatment scheduled for Thursday. Received dialysis yesterday with UF . Patient has been accepted to [facility]  on a TTS schedule, can start next Tuesday.   Lab Results  Component Value Date   CREATININE 4.84 (H) 04/02/2024   CREATININE 4.37 (H) 04/01/2024   CREATININE 4.57 (H) 03/30/2024     Intake/Output Summary (Last 24 hours) at 04/03/2024 1100 Last data filed at 04/03/2024 0900 Gross per 24 hour  Intake 240 ml  Output 900 ml  Net -660 ml       Acute respiratory failure Respiratory pathogen's panel by PCR is negative.  Blood cultures from 03/06/2024 are negative. Uses CPAP at night.  Weaned to room air  Anemia of CKD.  Hemoglobin 8.1, at last check. Continue retacrit  to 10000 units IV with dialysis..    LOS: [address] Faith Harris 12/10/202511:00 AM  [address] [place], [place] [phone]

## 2024-04-03 NOTE — Progress Notes (Addendum)
 BIPAP SETTINGS FOR THIS PT. HERE ARE IPAP OF 18/PEEP OF 8,RR 12,FIO2 35%.,tidal volume of 500 ml.

## 2024-04-04 LAB — RENAL FUNCTION PANEL
Albumin: 3.4 g/dL — ABNORMAL LOW (ref 3.5–5.0)
Anion gap: 10 (ref 5–15)
BUN: 34 mg/dL — ABNORMAL HIGH (ref 8–23)
CO2: 27 mmol/L (ref 22–32)
Calcium: 9.6 mg/dL (ref 8.9–10.3)
Chloride: 100 mmol/L (ref 98–111)
Creatinine, Ser: 4.32 mg/dL — ABNORMAL HIGH (ref 0.61–1.24)
GFR, Estimated: 13 mL/min — ABNORMAL LOW (ref >60)
Glucose, Bld: 102 mg/dL — ABNORMAL HIGH (ref 70–99)
Phosphorus: 4.5 mg/dL (ref 2.5–4.6)
Potassium: 4.3 mmol/L (ref 3.5–5.1)
Sodium: 136 mmol/L (ref 135–145)

## 2024-04-04 LAB — CBC
HCT: 25.7 % — ABNORMAL LOW (ref 39.0–52.0)
Hemoglobin: 7.9 g/dL — ABNORMAL LOW (ref 13.0–17.0)
MCH: 32.5 pg (ref 26.0–34.0)
MCHC: 30.7 g/dL (ref 30.0–36.0)
MCV: 105.8 fL — ABNORMAL HIGH (ref 80.0–100.0)
Platelets: 186 K/uL (ref 150–400)
RBC: 2.43 MIL/uL — ABNORMAL LOW (ref 4.22–5.81)
RDW: 17.2 % — ABNORMAL HIGH (ref 11.5–15.5)
WBC: 8.1 K/uL (ref 4.0–10.5)
nRBC: 0 % (ref 0.0–0.2)

## 2024-04-04 MED ORDER — HEPARIN SODIUM (PORCINE) 1000 UNIT/ML IJ SOLN
INTRAMUSCULAR | Status: AC
  Filled 2024-04-04: qty 5

## 2024-04-04 MED ORDER — HEPARIN SODIUM (PORCINE) 1000 UNIT/ML IJ SOLN
INTRAMUSCULAR | Status: AC
  Filled 2024-04-04: qty 1

## 2024-04-04 MED ORDER — EPOETIN ALFA-EPBX 10000 UNIT/ML IJ SOLN
INTRAMUSCULAR | Status: AC
  Filled 2024-04-04: qty 1

## 2024-04-04 MED ORDER — STERILE WATER FOR INJECTION IJ SOLN
INTRAMUSCULAR | Status: AC
  Filled 2024-04-04: qty 10

## 2024-04-04 MED ORDER — ALTEPLASE 2 MG IJ SOLR
INTRAMUSCULAR | Status: AC
  Filled 2024-04-04: qty 4

## 2024-04-04 MED ORDER — MIDODRINE HCL 5 MG PO TABS
ORAL_TABLET | ORAL | Status: AC
  Filled 2024-04-04: qty 2

## 2024-04-04 NOTE — Progress Notes (Signed)
 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 03/08/24 transferred to [facility] for initiation of CRRT. 03/09/24 seizure like episode, intubated. 03/15/24 extubated.   OT comments  Pt is supine in bed on arrival. Pleasant and agreeable to OT session. He denies pain. Pt performed bed mobility at MOD I level, transfer and ambulation ~20 ft using RW with CGA/SBA while on RA with handheld pulse ox reading of 93%. Pt performed seated UB bathing and dressing on BSC with set up assist and LB bathing in standing with unilateral support on RW with CGA/SBA for safety. Noted with mild SOB, still reading 93% on handheld pulse ox, although dynamap reading of 83%. Replaced 02 at 2L/min and sp02 reading of 100%. Pt left seated in recliner with all needs in place. He remains with limited endurance and weakness compared to his baseline.  Will cont to require skilled acute OT services to maximize his safety and IND to return to PLOF.       If plan is discharge home, recommend the following:  A little help with walking and/or transfers;A little help with bathing/dressing/bathroom   Equipment Recommendations  BSC/3in1 (bari BSC)    Recommendations for Other Services      Precautions / Restrictions Precautions Precautions: Fall Recall of Precautions/Restrictions: Impaired Restrictions Weight Bearing Restrictions Per Provider Order: No       Mobility Bed Mobility Overal bed mobility: Modified Independent                  Transfers Overall transfer level: Needs assistance Equipment  used: Rolling walker (2 wheels) Transfers: Sit to/from Stand Sit to Stand: Contact guard assist, Supervision           General transfer comment: stands with supervision from EOB and BSC during session with ambulation 20 ft using RW with CGA/SBA     Balance Overall balance assessment: Needs assistance Sitting-balance support: No upper extremity supported, Feet supported Sitting balance-Leahy Scale: Good     Standing balance support: During functional activity, Reliant on assistive device for balance, Single extremity supported Standing balance-Leahy Scale: Fair Standing balance comment: SBA/supv during standing LB bathing                           ADL either performed or assessed with clinical judgement   ADL Overall ADL's : Needs assistance/impaired     Grooming: Supervision/safety;Wash/dry face;Sitting   Upper Body Bathing: Supervision/ safety;Sitting   Lower Body Bathing: Supervison/ safety;Sit to/from stand;Contact guard assist   Upper Body Dressing : Supervision/safety;Sitting Upper Body Dressing Details (indicate cue type and reason): change out gowns     Toilet Transfer: Contact guard assist;Supervision/safety;Rolling walker (2 wheels);Ambulation;BSC/3in1             General ADL Comments: modification of tasks required to maintain adequate breathing, no physical assist required for UB/LB bathing during session    Extremity/Trunk Assessment              Vision       Perception     Praxis     Communication Communication Communication:  Impaired   Cognition Arousal: Alert Behavior During Therapy: WFL for tasks assessed/performed                                 Following commands: Intact, Impaired Following commands impaired: Follows one step commands with increased time      Cueing   Cueing Techniques: Verbal cues, Tactile cues  Exercises      Shoulder Instructions       General Comments placed on RA with  discepancy in 02 readings from handheld oulse ox 93% and dynamap 83%, placed back on 2L and 02 at 100% seated in recliner; pt with mild SOB during session on RA    Pertinent Vitals/ Pain       Pain Assessment Pain Assessment: No/denies pain Pain Intervention(s): Monitored during session  Home Living                                          Prior Functioning/Environment              Frequency  Min 2X/week        Progress Toward Goals  OT Goals(current goals can now be found in the care plan section)  Progress towards OT goals: Progressing toward goals  Acute Rehab OT Goals Patient Stated Goal: get stronger OT Goal Formulation: With patient Time For Goal Achievement: 04/12/24 Potential to Achieve Goals: Good  Plan      Co-evaluation                 AM-PAC OT 6 Clicks Daily Activity     Outcome Measure   Help from another person eating meals?: None Help from another person taking care of personal grooming?: None Help from another person toileting, which includes using toliet, bedpan, or urinal?: A Little Help from another person bathing (including washing, rinsing, drying)?: A Little Help from another person to put on and taking off regular upper body clothing?: None Help from another person to put on and taking off regular lower body clothing?: A Lot 6 Click Score: 20    End of Session Equipment Utilized During Treatment: Rolling walker (2 wheels);Oxygen   OT Visit Diagnosis: Other abnormalities of gait and mobility (R26.89);Muscle weakness (generalized) (M62.81)   Activity Tolerance Patient tolerated treatment well   Patient Left with call bell/phone within reach;in chair;with chair alarm set   Nurse Communication Mobility status        Time: 8477-8457 OT Time Calculation (min): 20 min  Charges: OT General Charges $OT Visit: 1 Visit OT Treatments $Self Care/Home Management : 8-22 mins  Tamotsu Wiederholt Chrismon, OTR/L  04/04/2024,  4:43 PM   Exzavier Ruderman E Chrismon 04/04/2024, 4:41 PM

## 2024-04-04 NOTE — Progress Notes (Signed)
 PT Cancellation Note  Patient Details Name: Dave Brown MRN: [id number] DOB: 01-24-1946   Cancelled Treatment:    Reason Eval/Treat Not Completed: Patient at procedure or test/unavailable Patient has been in HD all morning. Will re-attempt PT at later date.   Malak Duchesneau 04/04/2024, 1:09 PM

## 2024-04-04 NOTE — Progress Notes (Signed)
 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 He denied any fevers, chills, chest pain.     Of note, the patient was recently admitted to the hospital from 02/01/2024 to 02/05/2024 for respiratory failure due to CHF.  The patient did not require oxygen  at the time of his discharge. His discharge weight was 286.  At his last office visit with his primary provider his weight was 291 pounds on 02/22/24.  Follow up with pulmonary on 03/04/24 office weight 297.   He was discharged home with furosemide  40 mg daily during last hospitalization.  11/12: Admitted to [facility] [place] for treatment of Acute Decompensated HFpEF and AKI. 11/13: BP decreased, started on Levophed .  PCCM consulted.  ABX discontinued. 11/14: Transfer to [facility] for initiation of CRRT.  11/15: seizure like episode, intubated 11/16: Unable to wean; PICC line placed 11/21: Extubated to Bipap. Added Dobutamine .  11/23: Patient remains on bipap this AM. WBC stable at 7.6. H&H Stable. Coox SvO2 67%.  Electrolytes within normal range. NE decreased to 4. Remains on Dobutamin at 5.  11/24: Off BiPAP, awake and alert but remains encephalopathic.  Afebrile, Remains on Levophed , currently 10 mcg, weaning as tolerated.  Creatinine increased to 2.9, but UOP 1.4 L (net - 7.8L), Nephrology holding off on HD today. 11/25: No significant events overnight.  Midodrine  started overnight, now off Levophed .   Precedex  weaned off yesterday, remains encephalopathic but slowly improving. On 4L [place]. Creatinine worsened to 3.6, UOP 675 cc last 24 hrs (net - 8.4L),  plan for HD today.  Plan for aggressive pulmonary toilet and mobilization as able.  11/26: Remains critically ill; desatted to the 80s overnight, placed on bipap and developed hypotension requiring initiation of levophed  gtt again. Hypercapnic overnight, will recheck vbg. Nephrology following, placed back on CRRT.  11/27: No acute events overnight, afebrile, remains on Levophed  and CRRT.  Worsening Leukocytosis, obtain Sputum culture and start Linezolid .  Weaned off BiPAP to  11/28: remains on CRRT remains on pressors 12/01: Pt confused this am and refused to keep Bipap on.  Pt stable on 2L O2 via nasal canula no longer requiring vasopressors  03/26/24- patient for permcath placement. He is eating on his own but still behind on calories. He is being moved to [facility] and meds are refined today.  03/27/24- patient s/p permcath today.  He is hypotensive and has severe anemia this am.  Mild electrolyte derrangements which will improve post HD.  12/4.  Medical team taking over.  Patient tolerating dialysis with fluid removal.  Will transfer out of ICU. 12/6.  pCO2 58 on blood gas this morning.  Will be a candidate for noninvasive ventilation at home.  Dialysis today.  Will need outpatient dialysis. 12/7.  Feels okay. 12/9.  Patient seen on dialysis.  Ready to get out of the hospital.  BiPAP to be approved.  Has home oxygen  already.  Awaiting for outpatient dialysis slot to be financially approved.  12/10: Respiratory care team consulted for NIV/HMV setting at home.  Hemodialysis bed available and offered, [facility] TTS at  10:30a..  Patient is ready for discharged however Dialysis coordinator states due to weather, patient will not be able to start HD until 12/16. Discharge orders canceled.  12/11: Patient received dialysis today  NIV setting: IPAP OF 18/PEEP OF 8,RR 12,FIO2 35%, tidal volume is 500 ml. TOC aware of the settings.  Assessment and Plan:  Acute heart failure with preserved ejection  fraction (HFpEF) (HCC) EF greater than 75%, dialysis to help out with fluid management.  Blood pressure too low for other cardiac meds.   Anemia due to chronic kidney disease Anemia of chronic disease.  Hemoglobin 8.1 HD with hematocrit per nephrology   MRSA pneumonia [facility]) Completed course during the hospitalization   [facility]) Required pressors during the hospital course.  Now on midodrine  3 times daily.   Acute kidney injury superimposed on CKD AKI on CKD stage IV.  Patient required continuous dialysis while in the ICU and now on hemodialysis.  PermCath placed on 12/3.  Nephrology to look into outpatient dialysis units. 12/10: now on hemodialysis   Acute on chronic respiratory failure with hypoxia and hypercapnia (HCC) Patient on nasal cannula oxygen  and BiPAP at night.  Patient was intubated on 11/15 and extubated on 11/21.  Blood gas yesterday other day shows pCO2 of 58.  Patient is a candidate for noninvasive ventilation and this will decrease hospitalizations.  Patient on 2 L oxygen .   Toxic metabolic encephalopathy Mental status improved likely secondary to uremia   PAF (paroxysmal atrial fibrillation) (HCC) Patient not on any blood thinners or rate controlling medications at this point.  Patient did have hemoptysis and hematuria and anemia during the hospital course.   HOH (hard of hearing) Bilaterally, at baseline   ESRD on dialysis [facility]) Via Permcath    COPD (chronic obstructive pulmonary disease) (HCC) Patient with COPD and chronic respiratory failure on oxygen .  BiPAP will help reduce hospitalizations.   12/10 patient requires full mechanical ventilation/noninvasive ventilator therapy.  At Is not able to come to patient's home to help set this up.  [employer]/respiratory therapist can show family how to hook it up and set up at the hospital and family can do this at home.  Respiratory consulted for ventilator setting.   NIIV setting: IPAP of 18/PEEP of 8/RR 12/FiO2  35%/tidal volume 500 mL   Generalized weakness With balance difficulty Physical therapy recommending rehab.  Family would like to take home with home health.  12/10: Home health PT, OT ordered, face-to-face   Myocardial injury Seen by cardiology early on the hospital course was on heparin  but stopped secondary to hemoptysis and hematuria.  Cardiology will need to follow-up as outpatient.  Blood pressure too low for cardiac meds.   DVT prophylaxis: Heparin  5000 units subcutaneous every 8 hours Code Status: Full code Family Communication: No Disposition Plan: Patient is currently ready for discharge.  [facility] hemodialysis bed has been offered for Tuesday Thursday Saturday at 10:30 AM.  Patient will not be able to initiate dialysis outpatient until 12/16. Level of care: Med-Surg   Consultants:  Nephrology, PCCM, TOC, PT, OT      Subjective: No fever or chills  Physical Exam: Vitals:   04/04/24 1230 04/04/24 1254 04/04/24 1255 04/04/24 1609  BP: 106/64 (!) 107/52  (!) 101/52  Pulse: 75 74 73 78  Resp: 20 18 (!) 23 18  Temp:  98.1 F (36.7 C)  98.6 F (37 C)  TempSrc:  Oral  Oral  SpO2: 96% 100% 100% 99%  Weight:   113.5  kg   Height:       General exam: Appears calm and comfortable.  Respiratory system: Clear to auscultation. Respiratory effort normal. Cardiovascular system: S1 & S2 heard, RRR. No JVD, murmurs, rubs, gallops or clicks. No pedal edema. Gastrointestinal system: Morbidly obese abdomen, abdomen is nondistended, soft and nontender. No organomegaly or masses felt. Normal bowel sounds heard. Central nervous system: Alert and oriented. No focal neurological deficits. Extremities: Symmetric 5 x 5 power. Skin: No rashes, lesions or ulcers Psychiatry: Judgement and insight appear normal. Mood & affect appropriate.   Data Reviewed:  Reviewed  Family Communication: None available  Disposition: Status is: Inpatient Remains inpatient appropriate because: Ongoing  recovery and arrangement for outpatient dialysis  Planned Discharge Destination: Home with Home Health versus rehab    Time spent: 35 minutes  Author: Nena Rebel, MD 04/04/2024 4:23 PM  For on call review [email or web address].

## 2024-04-04 NOTE — Plan of Care (Signed)

## 2024-04-04 NOTE — Progress Notes (Signed)
 Hemodialysis Note:  Received patient in bed to unit. Alert and oriented. Informed consent singed and in chart.  Treatment initiated: 0920 Treatment completed:1254  Access used: Right internal jugular catheter Access issues: Instilled cathflo on his HD access due to not working properly. Blood Flow Rate decreased to 300. Rinsed back 20 minutes early due to system clot  Patient tolerated well. Transported back to room, alert without acute distress. Report given to patient's RN.  Total UF removed: 700 ml Medications given: Midodrine  10 mg Tablet, Retacrit  10000 units IV  Post HD weight: 113.5 kg  [facility]

## 2024-04-04 NOTE — Progress Notes (Signed)
 [facility] [place], [place] 04/04/2024  Subjective:   Hospital day # 60 78 year old male with hypertension, heart failure with preserved ejection fraction, hyperlipidemia, stage IV CKD, obstructive sleep apnea, MGUS, chronic respiratory failure on 2 L oxygen  presented from [facility] with shortness of breath.  Transferred to [facility] for need of continuous dialysis.  Update:   Patient seen and evaluated during dialysis   HEMODIALYSIS FLOWSHEET:  Blood Flow Rate (mL/min): 280 mL/min Arterial Pressure (mmHg): -131.51 mmHg Venous Pressure (mmHg): 92.12 mmHg TMP (mmHg): 20.4 mmHg Ultrafiltration Rate (mL/min): 743 mL/min Dialysate Flow Rate (mL/min): 300 ml/min Dialysis Fluid Bolus: Normal Saline Bolus Amount (mL): 300 mL Tolerating treatment well   Objective:  Vital signs in last 24 hours:  Temp:  [97.7 F (36.5 C)-98.8 F (37.1 C)] 98.3 F (36.8 C) (12/11 0736) Pulse Rate:  [68-79] 73 (12/11 1030) Resp:  [15-21] 18 (12/11 1030) BP: (95-120)/(54-70) 117/64 (12/11 1030) SpO2:  [99 %-100 %] 99 % (12/11 1030) FiO2 (%):  [35 %] 35 % (12/10 2125) Weight:  [112.9 kg-114.2 kg] 114.2 kg (12/11 0736)  Weight change: -2.1 kg Filed Weights   04/03/24 0500 04/04/24 0425 04/04/24 0736  Weight: 112.8 kg 112.9 kg 114.2 kg    Intake/Output:    Intake/Output Summary (Last 24 hours) at 04/04/2024 1038 Last data filed at 04/03/2024 1900 Gross per 24 hour  Intake 240 ml  Output --  Net 240 ml       Physical Exam: General: NAD  Pulm/lungs Clear, [place] O2  CVS/Heart S1S2 no rubs  Abdomen:  Distended, nontender  Extremities: No bilateral lower extremity edema  Neurologic: Alert, hard of hearing  Skin: Warm, dry  Access: Rt internal jugular permcath       Basic Metabolic Panel:  Recent Labs  Lab 03/29/24 0458 03/30/24 0900 04/01/24 0538 04/02/24 0805 04/04/24 0748  NA 133* 135 137 138 136  K 4.2 4.5 4.2 4.2 4.3  CL 96* 98 100 100 100  CO2  30 28 26 27 27   GLUCOSE 93 94 109* 109* 102*  BUN 33* 40* 36* 41* 34*  CREATININE 3.79* 4.57* 4.37* 4.84* 4.32*  CALCIUM  8.6* 9.0 9.7 9.9 9.6  PHOS 3.9 4.1  --  4.4 4.5     CBC: Recent Labs  Lab 03/29/24 0458 03/30/24 0900 04/01/24 0538 04/02/24 0805 04/04/24 0748  WBC 6.0 6.2  --  9.4 8.1  NEUTROABS 4.2  --   --   --   --   HGB 7.5* 7.6* 8.3* 8.1* 7.9*  HCT 23.4* 24.1*  --  26.6* 25.7*  MCV 100.4* 102.1*  --  105.1* 105.8*  PLT 151 176  --  191 186      Lab Results  Component Value Date   HEPBSAG NON REACTIVE 03/16/2024      Microbiology:  No results found for this or any previous visit (from the past 240 hours).   Coagulation Studies: No results for input(s): LABPROT, INR in the last 72 hours.   Urinalysis: No results for input(s): COLORURINE, LABSPEC, PHURINE, GLUCOSEU, HGBUR, BILIRUBINUR, KETONESUR, PROTEINUR, UROBILINOGEN, NITRITE, LEUKOCYTESUR in the last 72 hours.  Invalid input(s): APPERANCEUR    Imaging: No results found.       Medications:      vitamin C   500 mg Oral BID   Chlorhexidine  Gluconate Cloth  6 each Topical Q0600   epoetin  alfa-epbx (RETACRIT ) injection  10,000 Units Intravenous Q T,Th,Sat-1800   feeding supplement  237 mL Oral TID BM  heparin  injection (subcutaneous)  5,000 Units Subcutaneous Q8H   melatonin  2.5 mg Oral QHS   midodrine   10 mg Oral TID WC   multivitamin  1 tablet Oral QHS   mouth rinse  15 mL Mouth Rinse 4 times per day   pantoprazole   40 mg Oral Daily   sertraline   25 mg Oral Daily   zinc  sulfate (50mg  elemental zinc )  220 mg Oral Daily   docusate sodium , guaiFENesin -dextromethorphan , ipratropium-albuterol , midodrine , ondansetron  (ZOFRAN ) IV, mouth rinse, phenol, polyethylene glycol, sodium chloride , sodium chloride  flush, traZODone   Assessment/ Plan:  78 y.o. male with Obesity, HFpEF and obstructive sleep apnea/COPD requiring 2L supplemental oxygen , hyperlipidemia, chronic  kidney disease stage IV, MGUS, hypertension  admitted on 03/08/2024 for Acute and chronic respiratory failure with hypoxia [J96.21] Hypotension [I95.9] Acute on chronic respiratory failure with hypoxia and hypercapnia (HCC) [G03.78, J96.22]  Acute kidney injury on chronic kidney disease stage IV.  Baseline creatinine of 2.5-2.8. Acute kidney injury is thought to be secondary to pneumonia and circulatory shock causing ATN.  Patient responded well to IV Lasix  03/13/24.  However due to low CVP, further doses were held. CRRT stopped 11/22. First HD 11/23.  Update:  Midodrine  available with dialysis.Receiving dialysis today, UF 1L as tolerated.  Patient has been accepted to [facility]  on a TTS schedule, can start next Tuesday.   Lab Results  Component Value Date   CREATININE 4.32 (H) 04/04/2024   CREATININE 4.84 (H) 04/02/2024   CREATININE 4.37 (H) 04/01/2024     Intake/Output Summary (Last 24 hours) at 04/04/2024 1038 Last data filed at 04/03/2024 1900 Gross per 24 hour  Intake 240 ml  Output --  Net 240 ml       Acute respiratory failure Respiratory pathogen's panel by PCR is negative.  Blood cultures from 03/06/2024 are negative. Uses CPAP at night.  Placed back on 3L  Anemia of CKD.  Hemoglobin 7.9. Continue retacrit  to 10000 units IV with dialysis..    LOS: [address] Faith Harris 12/11/202510:38 AM  [address] [place], [place] [phone]

## 2024-04-05 LAB — BASIC METABOLIC PANEL WITH GFR
Anion gap: 8 (ref 5–15)
BUN: 27 mg/dL — ABNORMAL HIGH (ref 8–23)
CO2: 28 mmol/L (ref 22–32)
Calcium: 9.6 mg/dL (ref 8.9–10.3)
Chloride: 98 mmol/L (ref 98–111)
Creatinine, Ser: 3.72 mg/dL — ABNORMAL HIGH (ref 0.61–1.24)
GFR, Estimated: 16 mL/min — ABNORMAL LOW (ref >60)
Glucose, Bld: 107 mg/dL — ABNORMAL HIGH (ref 70–99)
Potassium: 4.6 mmol/L (ref 3.5–5.1)
Sodium: 135 mmol/L (ref 135–145)

## 2024-04-05 LAB — CBC
HCT: 25.7 % — ABNORMAL LOW (ref 39.0–52.0)
Hemoglobin: 7.8 g/dL — ABNORMAL LOW (ref 13.0–17.0)
MCH: 31.8 pg (ref 26.0–34.0)
MCHC: 30.4 g/dL (ref 30.0–36.0)
MCV: 104.9 fL — ABNORMAL HIGH (ref 80.0–100.0)
Platelets: 151 K/uL (ref 150–400)
RBC: 2.45 MIL/uL — ABNORMAL LOW (ref 4.22–5.81)
RDW: 17.2 % — ABNORMAL HIGH (ref 11.5–15.5)
WBC: 7.6 K/uL (ref 4.0–10.5)
nRBC: 0 % (ref 0.0–0.2)

## 2024-04-05 MED ORDER — MIDODRINE HCL 5 MG PO TABS
ORAL_TABLET | ORAL | Status: AC
  Filled 2024-04-05: qty 2

## 2024-04-05 MED ORDER — HEPARIN SODIUM (PORCINE) 1000 UNIT/ML IJ SOLN
INTRAMUSCULAR | Status: AC
  Filled 2024-04-05: qty 5

## 2024-04-05 MED FILL — Heparin Sodium (Porcine) Inj 1000 Unit/ML: INTRAMUSCULAR | Qty: 2 | Status: AC

## 2024-04-05 NOTE — TOC Transition Note (Signed)
 Transition of Care [facility]) - Discharge Note   Patient Details  Name: Dave Brown MRN: [id number] Date of Birth: 07-Jan-1946  [facility] CM/SW Contact:  Alfonso Rummer, LCSW Phone Number: 04/05/2024, 2:45 PM   Clinical Narrative:     Pt will discharge home with [facility] following. LCSW A Rummer called [facility] with [place] to advise pt is discharging. [employer] dme confirmed services are in place and will meet with family to provide education of NIV equipment.     Barriers to Discharge: Continued Medical Work up   Patient Goals and CMS Choice Patient states their goals for this hospitalization and ongoing recovery are:: Daughter would like for him to get better and be able to go back home.          Discharge Placement                       Discharge Plan and Services Additional resources added to the After Visit Summary for     Discharge Planning Services: CM Consult                                 Social Drivers of Health (SDOH) Interventions SDOH Screenings   Food Insecurity: No Food Insecurity (03/07/2024)  Housing: Low Risk (03/07/2024)  Transportation Needs: No Transportation Needs (03/07/2024)  Utilities: Not At Risk (03/07/2024)  Social Connections: Unknown (03/07/2024)  Tobacco Use: Medium Risk (03/19/2024)     Readmission Risk Interventions    03/18/2024   12:04 PM  Readmission Risk Prevention Plan  Transportation Screening Complete  PCP or Specialist Appt within 3-5 Days Complete  HRI or Home Care Consult Complete  Social Work Consult for Recovery Care Planning/Counseling Complete  Palliative Care Screening Not Applicable  Medication Review Oceanographer) Complete

## 2024-04-05 NOTE — Progress Notes (Signed)
 [facility] [place], [place] 04/05/2024  Subjective:   Hospital day # 3 78 year old male with hypertension, heart failure with preserved ejection fraction, hyperlipidemia, stage IV CKD, obstructive sleep apnea, MGUS, chronic respiratory failure on 2 L oxygen  presented from [facility] with shortness of breath.  Transferred to [facility] for need of continuous dialysis.  Update:   Patient seen and evaluated during dialysis   HEMODIALYSIS FLOWSHEET:  Blood Flow Rate (mL/min): 399 mL/min Arterial Pressure (mmHg): -173.32 mmHg Venous Pressure (mmHg): 160.6 mmHg TMP (mmHg): 9.49 mmHg Ultrafiltration Rate (mL/min): 767 mL/min Dialysate Flow Rate (mL/min): 299 ml/min Dialysis Fluid Bolus: Normal Saline Bolus Amount (mL): 300 mL Tolerating treatment well   Objective:  Vital signs in last 24 hours:  Temp:  [98.1 F (36.7 C)-98.6 F (37 C)] 98.2 F (36.8 C) (12/12 0749) Pulse Rate:  [66-78] 69 (12/12 0900) Resp:  [18-23] 19 (12/12 0900) BP: (101-120)/(52-98) 107/52 (12/12 0900) SpO2:  [96 %-100 %] 100 % (12/12 0900) FiO2 (%):  [2 %] 2 % (12/11 1609) Weight:  [886 kg-114 kg] 114 kg (12/12 0749)  Weight change: 1.3 kg Filed Weights   04/04/24 1255 04/05/24 0500 04/05/24 0749  Weight: 113.5 kg 113 kg 114 kg    Intake/Output:    Intake/Output Summary (Last 24 hours) at 04/05/2024 0940 Last data filed at 04/04/2024 1255 Gross per 24 hour  Intake --  Output 700 ml  Net -700 ml       Physical Exam: General: NAD  Pulm/lungs Clear, Ellis O2  CVS/Heart S1S2 no rubs  Abdomen:  Distended, nontender  Extremities: 1+ lower extremity edema  Neurologic: Alert, hard of hearing  Skin: Warm, dry  Access: Rt internal jugular permcath       Basic Metabolic Panel:  Recent Labs  Lab 03/30/24 0900 04/01/24 0538 04/02/24 0805 04/04/24 0748 04/05/24 0529  NA 135 137 138 136 135  K 4.5 4.2 4.2 4.3 4.6  CL 98 100 100 100 98  CO2 28 26 27 27 28   GLUCOSE  94 109* 109* 102* 107*  BUN 40* 36* 41* 34* 27*  CREATININE 4.57* 4.37* 4.84* 4.32* 3.72*  CALCIUM  9.0 9.7 9.9 9.6 9.6  PHOS 4.1  --  4.4 4.5  --      CBC: Recent Labs  Lab 03/30/24 0900 04/01/24 0538 04/02/24 0805 04/04/24 0748 04/05/24 0815  WBC 6.2  --  9.4 8.1 7.6  HGB 7.6* 8.3* 8.1* 7.9* 7.8*  HCT 24.1*  --  26.6* 25.7* 25.7*  MCV 102.1*  --  105.1* 105.8* 104.9*  PLT 176  --  191 186 151      Lab Results  Component Value Date   HEPBSAG NON REACTIVE 03/16/2024      Microbiology:  No results found for this or any previous visit (from the past 240 hours).   Coagulation Studies: No results for input(s): LABPROT, INR in the last 72 hours.   Urinalysis: No results for input(s): COLORURINE, LABSPEC, PHURINE, GLUCOSEU, HGBUR, BILIRUBINUR, KETONESUR, PROTEINUR, UROBILINOGEN, NITRITE, LEUKOCYTESUR in the last 72 hours.  Invalid input(s): APPERANCEUR    Imaging: No results found.       Medications:      vitamin C   500 mg Oral BID   Chlorhexidine  Gluconate Cloth  6 each Topical Q0600   epoetin  alfa-epbx (RETACRIT ) injection  10,000 Units Intravenous Q T,Th,Sat-1800   feeding supplement  237 mL Oral TID BM   heparin  injection (subcutaneous)  5,000 Units Subcutaneous Q8H   melatonin  2.5 mg Oral QHS   midodrine   10 mg Oral TID WC   multivitamin  1 tablet Oral QHS   mouth rinse  15 mL Mouth Rinse 4 times per day   pantoprazole   40 mg Oral Daily   sertraline   25 mg Oral Daily   zinc  sulfate (50mg  elemental zinc )  220 mg Oral Daily   docusate sodium , guaiFENesin -dextromethorphan , ipratropium-albuterol , midodrine , ondansetron  (ZOFRAN ) IV, mouth rinse, phenol, polyethylene glycol, sodium chloride , sodium chloride  flush, traZODone   Assessment/ Plan:  78 y.o. male with Obesity, HFpEF and obstructive sleep apnea/COPD requiring 2L supplemental oxygen , hyperlipidemia, chronic kidney disease stage IV, MGUS, hypertension  admitted on  03/08/2024 for Acute and chronic respiratory failure with hypoxia [J96.21] Hypotension [I95.9] Acute on chronic respiratory failure with hypoxia and hypercapnia (HCC) [G03.78, J96.22]  Acute kidney injury on chronic kidney disease stage IV.  Baseline creatinine of 2.5-2.8. Acute kidney injury is thought to be secondary to pneumonia and circulatory shock causing ATN.  Patient responded well to IV Lasix  03/13/24.  However due to low CVP, further doses were held. CRRT stopped 11/22. First HD 11/23.  Update:  Midodrine  available with dialysis.Receiving dialysis today, UF as tolerated.  Patient has been accepted to [facility] on a TTS schedule, can start next Tuesday.   Lab Results  Component Value Date   CREATININE 3.72 (H) 04/05/2024   CREATININE 4.32 (H) 04/04/2024   CREATININE 4.84 (H) 04/02/2024     Intake/Output Summary (Last 24 hours) at 04/05/2024 0940 Last data filed at 04/04/2024 1255 Gross per 24 hour  Intake --  Output 700 ml  Net -700 ml       Acute respiratory failure Respiratory pathogen's panel by PCR is negative.  Blood cultures from 03/06/2024 are negative. Uses CPAP at night.  Placed back on 3L  Anemia of CKD.  Lab Results  Component Value Date   HGB 7.8 (L) 04/05/2024   Continue retacrit  to 10000 units IV with dialysis..    LOS: 28 Lauro Manlove 12/12/20259:40 AM  [facility] [address] [place], [place] [phone]

## 2024-04-05 NOTE — Progress Notes (Signed)
 Physical Therapy Treatment Patient Details Name: Dave Brown MRN: [id number] DOB: 1945/09/05 Today's Date: 04/05/2024   History of Present Illness Dave Brown is a 78 y.o male with a past medical history significant for  HFpEF,, hypertension, hyperlipidemia, CKD stage IV, OSA, MGUS (12/21/2011), chronic respiratory failure on 2 L presented to [facility] on 03/06/24 with shortness of breath x 1 day along with mild increase in his lower extremity edema and orthopnea. 03/08/24 transferred to [facility] for initiation of CRRT. 03/09/24 seizure like episode, intubated. 03/15/24 extubated.    PT Comments  Patient received sitting up on side of bed. Patient is agreeable to PT session. He is very HOH, reads lips well. Patient is able to stand with supervision and ambulates 100 feet with RW. 2 standing rest breaks needed during ambulation due to fatigue. O2 sats remained in mid 90%s throughout session on room air. Patient will continue to benefit from skilled PT to improve endurance and strength.        If plan is discharge home, recommend the following: A little help with walking and/or transfers;A little help with bathing/dressing/bathroom;Assistance with cooking/housework;Help with stairs or ramp for entrance;Assist for transportation   Can travel by private vehicle     Yes  Equipment Recommendations  None recommended by PT    Recommendations for Other Services       Precautions / Restrictions Precautions Precautions: Fall Recall of Precautions/Restrictions: Impaired Restrictions Weight Bearing Restrictions Per Provider Order: No     Mobility  Bed Mobility               General bed mobility comments: NT patient received sitting up on side of bed eating lunch.    Transfers Overall transfer level: Needs assistance Equipment used: Rolling walker (2 wheels) Transfers: Sit to/from Stand Sit to Stand: Modified independent (Device/Increase time)                 Ambulation/Gait Ambulation/Gait assistance: Supervision Gait Distance (Feet): 100 Feet Assistive device: Rolling walker (2 wheels) Gait Pattern/deviations: Step-through pattern, Trunk flexed Gait velocity: decr     General Gait Details: patient steady with RW, limited by fatigue. Required 2 standing rest breaks during ambulation. O2 sats > 95% throughout on room air.   Stairs             Wheelchair Mobility     Tilt Bed    Modified Rankin (Stroke Patients Only)       Balance Overall balance assessment: Modified Independent Sitting-balance support: Feet supported Sitting balance-Leahy Scale: Normal     Standing balance support: Bilateral upper extremity supported, During functional activity, Reliant on assistive device for balance Standing balance-Leahy Scale: Good Standing balance comment: no external support needed                            Communication Communication Communication: Impaired Factors Affecting Communication: Hearing impaired  Cognition Arousal: Alert Behavior During Therapy: WFL for tasks assessed/performed   PT - Cognitive impairments: Difficult to assess Difficult to assess due to: Hard of hearing/deaf                       Following commands: Impaired Following commands impaired: Follows one step commands with increased time    Cueing Cueing Techniques: Verbal cues, Tactile cues  Exercises      General Comments        Pertinent Vitals/Pain Pain Assessment Pain Assessment: No/denies pain  Home Living                          Prior Function            PT Goals (current goals can now be found in the care plan section) Acute Rehab PT Goals Patient Stated Goal: to get better PT Goal Formulation: With patient Time For Goal Achievement: 04/19/24 Potential to Achieve Goals: Good Progress towards PT goals: Progressing toward goals    Frequency    Min 2X/week      PT Plan       Co-evaluation              AM-PAC PT 6 Clicks Mobility   Outcome Measure  Help needed turning from your back to your side while in a flat bed without using bedrails?: None Help needed moving from lying on your back to sitting on the side of a flat bed without using bedrails?: None Help needed moving to and from a bed to a chair (including a wheelchair)?: None Help needed standing up from a chair using your arms (e.g., wheelchair or bedside chair)?: None Help needed to walk in hospital room?: A Little Help needed climbing 3-5 steps with a railing? : A Little 6 Click Score: 22    End of Session   Activity Tolerance: Patient limited by fatigue Patient left: Other (comment) (seated on side of bed) Nurse Communication: Mobility status PT Visit Diagnosis: Muscle weakness (generalized) (M62.81);Difficulty in walking, not elsewhere classified (R26.2)     Time: 8589-8577 PT Time Calculation (min) (ACUTE ONLY): 12 min  Charges:    $Gait Training: 8-22 mins PT General Charges $$ ACUTE PT VISIT: 1 Visit                     Chai Verdejo, PT, GCS 04/05/2024,2:38 PM

## 2024-04-05 NOTE — Progress Notes (Signed)
 Pt discharged to home via wheelchair with daughter. Discharge instructions reviewed and all questions/concerns answered. PIV x2 removed. Dialysis catheter in right chest in place, clean dressing.

## 2024-04-05 NOTE — Discharge Summary (Addendum)
 Physician Discharge Summary   Patient: Dave Brown MRN: [id number] DOB: February 16, 1946  Admit date:     03/08/2024  Discharge date: 04/05/2024  Discharge Physician: Nena Rebel   PCP: Debrah Josette ORN., PA-C   Recommendations at discharge:   Continue to follow-up with nephrology and outpatient dialysis Tuesday Thursday and Saturday  Discharge Diagnoses: Principal Problem:   Acute heart failure with preserved ejection fraction (HFpEF) (HCC) Active Problems:   MRSA pneumonia (HCC)   Anemia due to chronic kidney disease   Shock (HCC)   Acute kidney injury superimposed on CKD   Acute on chronic respiratory failure with hypoxia and hypercapnia (HCC)   Toxic metabolic encephalopathy   PAF (paroxysmal atrial fibrillation) (HCC)   Sleep apnea   Myocardial injury   Encounter for dialysis and dialysis catheter care   Generalized weakness   COPD (chronic obstructive pulmonary disease) (HCC)   ESRD on dialysis (HCC)   HOH (hard of hearing) Sepsis present on admission with evidence of pneumonia, Temp 96.5, RR 24, WBC 17.1  Resolved Problems:   * No resolved hospital problems. 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 He denied any fevers, chills, chest pain.     Of note, the patient was recently admitted to the hospital from 02/01/2024 to 02/05/2024 for respiratory failure due to CHF.  The patient did not require oxygen  at the time of his discharge. His discharge weight was 286.  At his last office visit with his primary provider his weight was 291 pounds on 02/22/24.  Follow up with pulmonary on 03/04/24 office weight 297.   He was discharged home with furosemide  40 mg daily during last hospitalization.  11/12: Admitted to [facility] to  [place] for treatment of Acute Decompensated HFpEF and AKI. 11/13: BP decreased, started on Levophed .  PCCM consulted.  ABX discontinued. 11/14: Transfer to [facility] for initiation of CRRT.  11/15: seizure like episode, intubated 11/16: Unable to wean; PICC line placed 11/21: Extubated to Bipap. Added Dobutamine .  11/23: Patient remains on bipap this AM. WBC stable at 7.6. H&H Stable. [place]ox SvO2 67%.  Electrolytes within normal range. NE decreased to 4. Remains on Dobutamin at 5.  11/24: Off BiPAP, awake and alert but remains encephalopathic.  Afebrile, Remains on Levophed , currently 10 mcg, weaning as tolerated.  Creatinine increased to 2.9, but UOP 1.4 L (net - 7.8L), Nephrology holding off on HD today. 11/25: No significant events overnight.  Midodrine  started overnight, now off Levophed .   Precedex  weaned off yesterday, remains encephalopathic but slowly improving. On 4L [place]. Creatinine worsened to 3.6, UOP 675 cc last 24 hrs (net - 8.4L), plan for HD today.  Plan for aggressive pulmonary toilet and mobilization as able.  11/26: Remains critically ill; desatted to the 80s overnight, placed on bipap and developed hypotension requiring initiation of levophed  gtt again. Hypercapnic overnight, will recheck vbg. Nephrology following, placed back on CRRT.  11/27: No acute events overnight, afebrile, remains on Levophed  and CRRT.  Worsening Leukocytosis, obtain Sputum culture and start Linezolid .  Weaned off BiPAP to [place] 11/28: remains on CRRT remains on pressors 12/01: Pt confused this am and refused to keep Bipap on.  Pt stable on 2L O2 via nasal canula no longer requiring  vasopressors  03/26/24- patient for permcath placement. He is eating on his own but still behind on calories. He is being moved to [facility] and meds are refined today.  03/27/24- patient s/p permcath today.  He is hypotensive and has severe anemia this am.  Mild electrolyte derrangements which will improve post HD.  12/4.  Medical team taking  over.  Patient tolerating dialysis with fluid removal.  Will transfer out of ICU. 12/6.  pCO2 58 on blood gas this morning.  Will be a candidate for noninvasive ventilation at home.  Dialysis today.  Will need outpatient dialysis. 12/7.  Feels okay. 12/9.  Patient seen on dialysis.  Ready to get out of the hospital.  BiPAP to be approved.  Has home oxygen  already.  Awaiting for outpatient dialysis slot to be financially approved.  12/10: Respiratory care team consulted for NIV/HMV setting at home.  Hemodialysis bed available and offered, [facility] at 10:30a..  Patient is ready for discharged however Dialysis coordinator states due to weather, patient will not be able to start HD until 12/16. Discharge orders canceled.  12/11: Patient received dialysis 12/12: Patient received dialysis  NIV setting: IPAP OF 18/PEEP OF 8,RR 12,FIO2 35%, tidal volume is 500 ml. TOC aware of the settings.  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 Blood pressure too low for other cardiac meds.  Anemia due to chronic kidney disease Anemia of chronic disease.  Hemoglobin 7.8 12/12 HD with hematocrit per nephrology  MRSA pneumonia [facility]) Completed course during the hospitalization  Shock [facility]) Required pressors during the hospital course. Now on midodrine  10 mg 3 times daily.  Acute kidney injury superimposed on CKD AKI on CKD stage IV.  Patient required continuous dialysis while in the ICU and now on hemodialysis.  PermCath placed on 12/3. Nephrology to look into outpatient dialysis units. 12/10: now on hemodialysis Outpatient dialysis has been arranged beginning next Tuesday.  Patient will be discharged home with home care 04/05/2024  Acute on chronic respiratory failure with hypoxia and hypercapnia (HCC) Patient on nasal cannula oxygen  and BiPAP at night.  Patient was intubated  on 11/15 and extubated on 11/21.  Blood gas yesterday other day shows pCO2 of 58.  Patient is a candidate for noninvasive ventilation and this will decrease hospitalizations.   Patient is saturating 92 to 94% on room air  Toxic metabolic encephalopathy Mental status improved likely secondary to uremia, resolved  PAF (paroxysmal atrial fibrillation) (HCC) Patient not on any blood thinners or rate controlling medications at this point.   Patient did have hemoptysis and hematuria and anemia during the hospital course.  HOH (hard of hearing) Bilaterally, at baseline  ESRD on dialysis [facility]) Via Permcath per nephrology TTS  COPD (chronic obstructive pulmonary disease) (HCC) Patient with COPD and chronic respiratory failure on oxygen . BiPAP will help reduce hospitalizations. He has been set up for CPAP during the night  12/10 patient requires full mechanical ventilation/noninvasive ventilator therapy.  At Is not able to come to patient's home to help set this up.   [employer]/respiratory therapist is working to give information to the daughter who is coming around 3-4 o'clock today.  Patient will be discharged home with home care after that education was done to the daughter by the respiratory therapist from [employer].  CPAP/BiPAP setting.  IPAP of 18/PEEP of 8/RR 12/FiO2 35%/tidal volume 500 mL  Generalized weakness With balance difficulty  Physical therapy recommending rehab.  Family would like to take home with home health.  12/10: Home health PT, OT ordered, face-to-face  Myocardial injury Seen by cardiology early on the hospital course was on heparin  but stopped secondary to hemoptysis and hematuria.  Cardiology will need to follow-up as outpatient.  Blood pressure too low for cardiac meds.       Pain control - [place]  Controlled Substance Reporting System database was reviewed. and patient was instructed, not to drive, operate heavy machinery, perform activities at heights, swimming  or participation in water  activities or provide baby-sitting services while on Pain, Sleep and Anxiety Medications; until their outpatient Physician has advised to do so again. Also recommended to not to take more than prescribed Pain, Sleep and Anxiety Medications.  Consultants: Nephrology cardiology Procedures performed: Dialysis, permacath Disposition: Home health Diet recommendation:  Discharge Diet Orders (From admission, onward)     Start     Ordered   04/03/24 0000  Diet - low sodium heart healthy        04/03/24 1043           Cardiac diet DISCHARGE MEDICATION: Allergies as of 04/05/2024   No Known Allergies      Medication List     STOP taking these medications    hydrocortisone  sodium succinate  100 MG injection Commonly known as: SOLU-CORTEF    norepinephrine  4-5 MG/250ML-% Soln Commonly known as: LEVOPHED        TAKE these medications    albuterol  (2.5 MG/3ML) 0.083% nebulizer solution Commonly known as: PROVENTIL  Take 2.5 mg by nebulization every 6 (six) hours as needed for shortness of breath or wheezing.   b complex-vitamin c -folic acid  0.8 MG Tabs tablet Take 1 tablet by mouth at bedtime.   feeding supplement (NEPRO CARB STEADY) Liqd Take 237 mLs by mouth 3 (three) times daily between meals.   ipratropium-albuterol  0.5-2.5 (3) MG/3ML Soln Commonly known as: DUONEB Take 3 mLs by nebulization every 6 (six) hours as needed.   midodrine  10 MG tablet Commonly known as: PROAMATINE  Take 1 tablet (10 mg total) by mouth 3 (three) times daily with meals. What changed: Another medication with the same name was added. Make sure you understand how and when to take each.   midodrine  10 MG tablet Commonly known as: PROAMATINE  Take 1 tablet (10 mg total) by mouth every dialysis (hypotension). What changed: You were already taking a medication with the same name, and this prescription was added. Make sure you understand how and when to take each.   Robafen  DM 20-200 MG/20ML Liqd Generic drug: Dextromethorphan -guaiFENesin  Take 10 mLs by mouth every 4 (four) hours as needed for cough.   rosuvastatin  10 MG tablet Commonly known as: CRESTOR  Take 10 mg by mouth daily.   sertraline  25 MG tablet Commonly known as: ZOLOFT  Take 25 mg by mouth in the morning.   traZODone  50 MG tablet Commonly known as: DESYREL  Take 1 tablet (50 mg total) by mouth at bedtime as needed for sleep.   Tylenol  325 MG tablet Generic drug: acetaminophen  Take 325-650 mg by mouth every 8 (eight) hours as needed for mild pain (pain score 1-3) (or headaches).   Zinc  Sulfate 220 (50 Zn) MG Tabs Take 1 tablet (220 mg total) by mouth daily.               Durable Medical Equipment  (From admission, onward)           Start     Ordered  04/03/24 1043  For home use only DME Nebulizer machine  Once       Question Answer Comment  Patient needs a nebulizer to treat with the following condition COPD exacerbation (HCC)   Length of Need Lifetime   Additional equipment included Administration kit   Additional equipment included Filter      04/03/24 1043   04/01/24 1420  For home use only DME oxygen   Once       Question Answer Comment  Length of Need Lifetime   Mode or (Route) Nasal cannula   Liters per Minute 3   Frequency Continuous (stationary and portable oxygen  unit needed)   Oxygen  conserving device No   Oxygen  delivery system: Gas      04/01/24 1420   04/01/24 1411  For home use only DME Bipap  Once       Comments: Can use trilogy machine  Question Answer Comment  Length of Need Lifetime   Bleed in oxygen  (LPM) 3   Keep O2 saturation between 90%      04/01/24 1411            Contact information for after-discharge care     Home Medical Care     [facility] [place] .   Service: Home Health Services Contact information: [phone] [address] [place]  [phone]                    Discharge  Exam: Fredricka Weights   04/05/24 0500 04/05/24 0749 04/05/24 1115  Weight: 113 kg 114 kg 113 kg   Constitutional: Alert, awake, calm, comfortable HEENT: Neck supple Respiratory: Clear to auscultation B/L, no wheezing, no rales.  Cardiovascular: Regular rate and rhythm, no murmurs / rubs / gallops. No extremity edema. 2+ pedal pulses. No carotid bruits.  Abdomen: Soft, no tenderness, Bowel sounds positive.  Obese abdomen Musculoskeletal: no clubbing / cyanosis. Good ROM, no contractures. Normal muscle tone.  Skin: no rashes, lesions, ulcers. Neurologic: CN 2-12 grossly intact. Sensation intact, No focal deficit identified Psychiatric: Alert and oriented x 3. Normal mood.     Condition at discharge: good  The results of significant diagnostics from this hospitalization (including imaging, microbiology, ancillary and laboratory) are listed below for reference.   Imaging Studies: PERIPHERAL VASCULAR CATHETERIZATION Result Date: 03/27/2024 See surgical note for result.  DG Chest 1 View Result Date: 03/21/2024 EXAM: 1 AP VIEW(S) XRAY OF THE CHEST 03/19/2024 07:25:00 AM COMPARISON: Portable chest 03/19/2024. CLINICAL HISTORY: 741019 PICC (peripherally inserted central catheter) in place [id number] PICC (peripherally inserted central catheter) in place FINDINGS: LINES, TUBES AND DEVICES: Right PICC has been pulled back into the distal SVC. LUNGS AND PLEURA: Small layering pleural effusions continue to be noted with overlying basilar atelectasis or consolidation. There is Perihilar vascular congestion, central and basilar interstitial edema. Overall aeration is unchanged with no other focal abnormality. No pneumothorax. HEART AND MEDIASTINUM: Stable cardiomegaly. Mediastinum is stable with widening probably due to lipomatosis. There is calcification in the transverse aorta. BONES AND SOFT TISSUES: Thoracic spondylosis. IMPRESSION: 1. Right PICC terminates in the distal SVC. 2. Small layering pleural  effusions with overlying basilar atelectasis or consolidation. 3. Stable cardiomegaly with perihilar vascular congestion and central and basilar interstitial edema. Electronically signed by: Francis Quam MD 03/21/2024 07:40 AM EST RP Workstation: [id number]   US  EKG SITE RITE Result Date: 03/21/2024 If Site Rite image not attached, placement could not be confirmed due to current cardiac rhythm.  DG Abd 1  View Result Date: 03/19/2024 CLINICAL DATA:  Abdominal distension. EXAM: ABDOMEN - 1 VIEW COMPARISON:  Radiographs 03/09/2024.  Chest CT 03/07/2024. FINDINGS: Three supine views of the abdomen are submitted. The enteric tube has been removed. A left femoral line is in place. There is increased moderate to marked gaseous distention of the stomach. There is gas throughout the small and large bowel which are not significantly distended. Similar small left pleural effusion and associated left basilar airspace disease, probably atelectasis. IMPRESSION: Increased gaseous distention of the stomach following enteric tube removal. This could reflect gastroparesis or ileus. No evidence of bowel obstruction. Electronically Signed   By: Elsie Perone M.D.   On: 03/19/2024 18:54   DG Chest Port 1 View Result Date: 03/19/2024 CLINICAL DATA:  427266 Acute respiratory failure with hypoxia (HCC) [id number] EXAM: PORTABLE CHEST 1 VIEW COMPARISON:  Radiographs 03/12/2024 and 03/09/2024.  CT 03/07/2024. FINDINGS: 0557 hours. Interval extubation and removal of the enteric tube. Right arm PICC projects to the level of the superior cavoatrial junction. Stable cardiomegaly and aortic atherosclerosis. Interval decreased volume of right pleural effusion with improved aeration of the right lung base. Left pleural effusion and probable left basilar atelectasis have not significantly changed. Stable mild vascular congestion. No pneumothorax or acute osseous abnormality. IMPRESSION: 1. Interval extubation and removal of enteric tube.  2. Interval decreased volume of right pleural effusion with improved aeration of the right lung base. 3. Stable left pleural effusion and probable left basilar atelectasis. Electronically Signed   By: Elsie Perone M.D.   On: 03/19/2024 11:01   ECHOCARDIOGRAM LIMITED Result Date: 03/16/2024    ECHOCARDIOGRAM LIMITED REPORT   Patient Name:   ANTHONEY SHEPPARD Date of Exam: 03/16/2024 Medical Rec #:  [id number]   Height:       69.0 in Accession #:    [id number]  Weight:       278.0 lb Date of Birth:  Dec 17, 1945    BSA:          2.376 m Patient Age:    78 years    BP:           126/46 mmHg Patient Gender: M           HR:           82 bpm. Exam Location:  [facility] Procedure: Limited Echo and Limited Color Doppler (Both Spectral and Color Flow            Doppler were utilized during procedure). Indications:     Shock R57.9  History:         Patient has prior history of Echocardiogram examinations, most                  recent 03/07/2024.  Sonographer:     Thedora Louder RDCS, FASE Referring Phys:  [id number] TRAVIS SKIPINA Diagnosing Phys: Caron Poser  Sonographer Comments: This study was perform with patient positioned supine and on BiPaP. IMPRESSIONS  1. Left ventricular ejection fraction, by estimation, is >75%. The left ventricle has hyperdynamic function. Left ventricular endocardial border not optimally defined to evaluate regional wall motion. There is severe concentric left ventricular hypertrophy. Left ventricular diastolic parameters are consistent with Grade I diastolic dysfunction (impaired relaxation).  2. Right ventricular systolic function is normal. The right ventricular size is normal.  3. There is no evidence of pericardial effusion.  4. The mitral valve is degenerative. No evidence of mitral valve regurgitation. No evidence of mitral stenosis.  5. The aortic valve has  an indeterminant number of cusps. There is moderate calcification of the aortic valve. Aortic valve regurgitation is not visualized. Aortic  valve sclerosis/calcification is present, without any evidence of aortic stenosis.  6. Aortic dilatation noted. There is mild dilatation of the aortic root, measuring 42 mm.  7. The inferior vena cava is dilated in size with <50% respiratory variability, suggesting right atrial pressure of 15 mmHg. Comparison(s): A prior study was performed on 03/07/2024. No significant change from prior study. FINDINGS  Left Ventricle: Left ventricular ejection fraction, by estimation, is >75%. The left ventricle has hyperdynamic function. Left ventricular endocardial border not optimally defined to evaluate regional wall motion. There is severe concentric left ventricular hypertrophy. Left ventricular diastolic parameters are consistent with Grade I diastolic dysfunction (impaired relaxation). Right Ventricle: The right ventricular size is normal. No increase in right ventricular wall thickness. Right ventricular systolic function is normal. Left Atrium: Left atrial size was not assessed. Right Atrium: Right atrial size was not assessed. Pericardium: There is no evidence of pericardial effusion. Mitral Valve: The mitral valve is degenerative in appearance. There is mild calcification of the mitral valve leaflet(s). Mild mitral annular calcification. No evidence of mitral valve stenosis. Tricuspid Valve: The tricuspid valve is not well visualized. Tricuspid valve regurgitation is not demonstrated. No evidence of tricuspid stenosis. Aortic Valve: The aortic valve has an indeterminant number of cusps. There is moderate calcification of the aortic valve. Aortic valve regurgitation is not visualized. Aortic valve sclerosis/calcification is present, without any evidence of aortic stenosis. Aortic valve mean gradient measures 8.0 mmHg. Aortic valve peak gradient measures 13.7 mmHg. Pulmonic Valve: The pulmonic valve was not assessed. Aorta: Aortic dilatation noted. There is mild dilatation of the aortic root, measuring 42 mm. Venous: The  inferior vena cava is dilated in size with less than 50% respiratory variability, suggesting right atrial pressure of 15 mmHg. Additional Comments: Spectral Doppler performed. Color Doppler performed.  LEFT VENTRICLE PLAX 2D LVIDd:         4.30 cm   Diastology LVIDs:         3.00 cm   LV e' medial:    5.22 cm/s LV PW:         1.50 cm   LV E/e' medial:  16.2 LV IVS:        1.50 cm   LV e' lateral:   6.96 cm/s LVOT diam:     2.00 cm   LV E/e' lateral: 12.1 LVOT Area:     3.14 cm LV IVRT:       121 msec  RIGHT VENTRICLE RV S prime:     13.80 cm/s TAPSE (M-mode): 1.9 cm LEFT ATRIUM         Index LA diam:    4.40 cm 1.85 cm/m  AORTIC VALVE AV Vmax:      185.00 cm/s AV Vmean:     129.000 cm/s AV VTI:       0.296 m AV Peak Grad: 13.7 mmHg AV Mean Grad: 8.0 mmHg  AORTA Ao Root diam: 4.20 cm MITRAL VALVE MV Area (PHT): 3.53 cm    SHUNTS MV Decel Time: 215 msec    Systemic Diam: 2.00 cm MV E velocity: 84.40 cm/s MV A velocity: 87.00 cm/s MV E/A ratio:  0.97 Caron Poser Electronically signed by Caron Poser Signature Date/Time: 03/16/2024/9:29:35 AM    Final    DG Chest Port 1 View Result Date: 03/12/2024 CLINICAL DATA:  Intubated.  Respiratory failure. EXAM: PORTABLE CHEST 1 VIEW COMPARISON:  03/09/2024. FINDINGS: Stable endotracheal tube tip located approximately 2.3 cm above the carina. Right-sided PICC tip overlies the superior cavoatrial junction. Enteric tube courses below the left hemidiaphragm, beyond the field of view. Stable mild enlargement of the cardiac silhouette. Similar small bilateral pleural effusions with bibasilar opacities, favored to reflect atelectasis. Pulmonary vascular congestion with possible mild interstitial edema. No pneumothorax. Visualized osseous structures are unchanged. IMPRESSION: 1. Appropriately positioned support apparatus, as above. 2. Similar small bilateral pleural effusions with bibasilar opacities, favored to reflect atelectasis. 3. Pulmonary vascular congestion with  possible mild interstitial edema. Electronically Signed   By: Harrietta Sherry M.D.   On: 03/12/2024 12:10   EEG adult Result Date: 03/11/2024 Shelton Arlin KIDD, MD     03/11/2024  4:18 PM Patient Name: Clide Remmers MRN: [id number] Epilepsy Attending: Arlin KIDD Shelton Referring Physician/Provider: Malka Domino, MD Date: 03/11/2024 Duration: 26.59 mins Patient history: 78yo M with ams. EEG to evaluate for seizure Level of alertness: Comatose AEDs during EEG study: Propofol  Technical aspects: This EEG study was done with scalp electrodes positioned according to the 10-20 International system of electrode placement. Electrical activity was reviewed with band pass filter of 1-70Hz , sensitivity of 7 uV/mm, display speed of 4mm/sec with a 60Hz  notched filter applied as appropriate. EEG data were recorded continuously and digitally stored.  Video monitoring was available and reviewed as appropriate. Description: EEG showed continuous generalized 3 to 6 Hz theta-delta slowing admixed with 13-15hz  beta activity distributed symmetrically and diffusely. Hyperventilation and photic stimulation were not performed.   ABNORMALITY - Continuous slow, generalized IMPRESSION: This study is suggestive of severe diffuse encephalopathy, nonspecific etiology but likely related to sedation. No seizures or epileptiform discharges were seen throughout the recording. Priyanka KIDD Shelton   US  EKG SITE RITE Result Date: 03/10/2024 If Site Rite image not attached, placement could not be confirmed due to current cardiac rhythm.  CT HEAD WO CONTRAST ( ) Result Date: 03/10/2024 EXAM: CT HEAD WITHOUT CONTRAST 03/10/2024 10:05:27 AM TECHNIQUE: CT of the head was performed without the administration of intravenous contrast. Automated exposure control, iterative reconstruction, and/or weight based adjustment of the mA/kV was utilized to reduce the radiation dose to as low as reasonably achievable. COMPARISON: None available. CLINICAL  HISTORY: Seizure, new-onset, no history of trauma. FINDINGS: BRAIN AND VENTRICLES: There is mild age-related cerebral volume loss present. No acute hemorrhage. No evidence of acute infarct. No hydrocephalus. No extra-axial collection. No mass effect or midline shift. ORBITS: No acute abnormality. SINUSES: No acute abnormality. SOFT TISSUES AND SKULL: Oral tracheal and orogastric tubes are present. No acute soft tissue abnormality. No skull fracture. IMPRESSION: 1. No acute intracranial abnormality. 2. Mild age-related cerebral volume loss. Electronically signed by: Evalene Coho MD 03/10/2024 10:09 AM EST RP Workstation: [id number]   DG Abd Portable 1V Result Date: 03/09/2024 CLINICAL DATA:  Enteric catheter placement EXAM: PORTABLE ABDOMEN - 1 VIEW COMPARISON:  None Available. FINDINGS: Frontal view of the lower chest and upper abdomen demonstrates enteric catheter passing below diaphragm, tip and side port projecting over the gastric body. Bowel gas pattern is unremarkable. IMPRESSION: 1. Enteric catheter tip projecting over the gastric body. Electronically Signed   By: Ozell Daring M.D.   On: 03/09/2024 16:38   DG Chest Port 1 View Result Date: 03/09/2024 CLINICAL DATA:  Status post intubation and orogastric tube placement. EXAM: PORTABLE CHEST 1 VIEW COMPARISON:  March 06, 2024 FINDINGS: Endotracheal tube is seen with its distal tip approximately 2.3 cm from the carina. An enteric tube  is noted with its distal end extending below the diaphragm into the body of the stomach. The cardiac silhouette is mildly enlarged and unchanged in size. There is moderate severity calcification of the aortic arch. Low lung volumes are noted. There are small bilateral pleural effusions. Mild bibasilar atelectatic changes are suspected. No pneumothorax is identified. The visualized skeletal structures are unremarkable. IMPRESSION: 1. Endotracheal tube and enteric tube positioning, as described above. 2. Low lung  volumes with small bilateral pleural effusions and mild bibasilar atelectatic changes. Electronically Signed   By: Suzen Dials M.D.   On: 03/09/2024 16:36   US  Abdomen Complete Result Date: 03/07/2024 EXAM: COMPLETE ABDOMINAL ULTRASOUND TECHNIQUE: Real-time ultrasonography of the abdomen was performed. COMPARISON: None available. CLINICAL HISTORY: Acute kidney injury, thrombocytopenia. FINDINGS: LIVER: Hepatic steatosis. No intrahepatic biliary ductal dilatation. Patent portal vein with antegrade flow. BILIARY SYSTEM: The gallbladder was unable to be visualized due to body habitus and difficulty with positioning. The common bile duct measures 4 mm. No cholelithiasis. KIDNEYS: Right kidney measures 8.2 cm in length. Left kidney measures 9.9 cm in length. There is increased cortical echogenicity in both kidneys. A 2.8 cm cyst is present in the right kidney, which does not require follow up. Limited visualization of the left kidney. PANCREAS: The pancreas was not visualized. SPLEEN: Splenomegaly measuring 16.7 cm greatest dimension. Volume of 923 ml. VESSELS: Poor visualization of the aorta and IVC. OTHER: The exam is significantly limited by body habitus and inability to roll / position properly. IMPRESSION: 1. Limited exam. Gallbladder not visualized. 2. Increased cortical echogenicity in both kidneys, compatible with medical renal disease. 3. Splenomegaly measuring 16.7 cm (volume 923 mL). 4. Hepatic steatosis. Electronically signed by: Norman Gatlin MD 03/07/2024 07:25 PM EST RP Workstation: [id number]   CT CHEST WO CONTRAST Result Date: 03/07/2024 CLINICAL DATA:  Pneumonia complication suspected. EXAM: CT CHEST WITHOUT CONTRAST TECHNIQUE: Multidetector CT imaging of the chest was performed following the standard protocol without IV contrast. RADIATION DOSE REDUCTION: This exam was performed according to the departmental dose-optimization program which includes automated exposure control, adjustment  of the mA and/or kV according to patient size and/or use of iterative reconstruction technique. COMPARISON:  Chest radiograph dated 03/06/2024. FINDINGS: Evaluation of this exam is limited in the absence of intravenous contrast. Cardiovascular: Borderline cardiomegaly. No pericardial effusion. Advanced 3 vessel coronary vascular calcification. Mild atherosclerotic calcification of the thoracic aorta. No aneurysmal dilatation. There is mild dilatation of the main pulmonary trunk suggestive of pulmonary hypertension. Mediastinum/Nodes: No hilar or mediastinal adenopathy. The esophagus and the thyroid gland are grossly unremarkable no mediastinal fluid collection. Lungs/Pleura: Small bilateral pleural effusions with partial compressive atelectasis of the lower lobes versus pneumonia. No pneumothorax. A 1 cm nodular density in the distal trachea (57/4) likely a mucous secretion. A polyp is less likely. Attention on follow-up imaging recommended. The central airways are patent. Upper Abdomen: Indeterminate 3.5 cm right adrenal nodule, possibly an adenoma. MRI can provide better evaluation. Musculoskeletal: Osteopenia with degenerative changes spine. No acute osseous pathology. A 2.5 cm probable sebaceous cyst the subcutaneous soft tissues of the posterior thoracic wall to the right of the midline. IMPRESSION: 1. Small bilateral pleural effusions with partial compressive atelectasis of the lower lobes versus pneumonia. 2.  Aortic Atherosclerosis (ICD10-I70.0). Electronically Signed   By: Vanetta Chou M.D.   On: 03/07/2024 18:40   ECHOCARDIOGRAM LIMITED Result Date: 03/07/2024    ECHOCARDIOGRAM LIMITED REPORT   Patient Name:   KENI ELISON Date of Exam: 03/07/2024 Medical Rec #:  [id number]   Height:       69.0 in Accession #:    [id number]  Weight:       300.5 lb Date of Birth:  14-Jul-1945    BSA:          2.456 m Patient Age:    78 years    BP:           86/46 mmHg Patient Gender: M           HR:           66 bpm.  Exam Location:  [facility] Procedure: Limited Echo, Cardiac Doppler and Intracardiac Opacification Agent            (Both Spectral and Color Flow Doppler were utilized during            procedure). Indications:    Shock R57.9  History:        Patient has prior history of Echocardiogram examinations, most                 recent 02/02/2024. CHF; Risk Factors:Sleep Apnea, Hypertension                 and Dyslipidemia. Hx of COVID-19.  Sonographer:    Aida Pizza RCS Referring Phys: [id number] TORIBIO JAYSON SHARPS IMPRESSIONS  1. Limited Echo to evaluate LV function.  2. No LV thrombus by Definity . Left ventricular ejection fraction, by estimation, is 60 to 65%. The left ventricle has normal function. The left ventricle has no regional wall motion abnormalities. 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 Left ventricular diastolic parameters are consistent with Grade III diastolic dysfunction (restrictive). Elevated left atrial pressure.  3. Right ventricular systolic function is normal. The right ventricular size is normal. Tricuspid regurgitation signal is inadequate for assessing PA pressure.  4. Aortic dilatation noted. There is mild dilatation of the aortic root, measuring 40 mm.  5. The inferior vena cava is dilated in size with <50% respiratory variability, suggesting right atrial pressure of 15 mmHg. Comparison(s): No significant change from prior study. FINDINGS  Left Ventricle: No LV thrombus by Definity . Left ventricular ejection fraction, by estimation, is 60 to 65%. The left ventricle has normal function. The left ventricle has no regional wall motion abnormalities. Definity  contrast agent was given IV to delineate the left ventricular endocardial borders. The left ventricular internal cavity size was normal in size. 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 Left ventricular diastolic parameters are consistent with Grade III diastolic dysfunction (restrictive).  Elevated left atrial pressure. Right Ventricle: The right ventricular size is normal. No increase in right ventricular wall thickness. Right ventricular systolic function is normal. Tricuspid regurgitation signal is inadequate for assessing PA pressure. Left Atrium: Left atrial size was normal in size. Right Atrium: Right atrial size was normal in size. Pericardium: There is no evidence of pericardial effusion. Mitral Valve: The mitral valve is normal in structure. MV peak gradient, 4.2 mmHg. The mean mitral valve gradient is 1.0 mmHg. Tricuspid Valve: The tricuspid valve is normal in structure. Aortic Valve: The aortic valve is tricuspid. Pulmonic Valve: The pulmonic valve was normal in structure. Aorta: Aortic dilatation noted. There is mild dilatation of the aortic root, measuring 40 mm. Venous: The inferior vena cava is dilated in size with less than 50% respiratory variability, suggesting right atrial pressure of 15 mmHg. IAS/Shunts: No atrial level shunt detected by color flow Doppler. LEFT VENTRICLE PLAX 2D LVIDd:  3.80 cm LVIDs:         2.40 cm LV PW:         1.60 cm LV IVS:        1.70 cm LVOT diam:     1.90 cm LVOT Area:     2.84 cm  RIGHT VENTRICLE TAPSE (M-mode): 1.4 cm LEFT ATRIUM         Index LA diam:    3.70 cm 1.51 cm/m   AORTA Ao Root diam: 4.00 cm MITRAL VALVE MV Area (PHT): 4.89 cm     SHUNTS MV Peak grad:  4.2 mmHg     Systemic Diam: 1.90 cm MV Mean grad:  1.0 mmHg MV Vmax:       1.03 m/s MV Vmean:      54.2 cm/s MV Decel Time: 155 msec MV E velocity: 109.00 cm/s MV A velocity: 52.40 cm/s MV E/A ratio:  2.08 Vishnu Priya Mallipeddi Electronically signed by Diannah Late Mallipeddi Signature Date/Time: 03/07/2024/4:59:24 PM    Final    DG Chest Port 1 View Result Date: 03/06/2024 EXAM: 1 VIEW(S) XRAY OF THE CHEST 03/06/2024 09:53:00 PM COMPARISON: Comparison with 02/01/2024. CLINICAL HISTORY: SOB. FINDINGS: LUNGS AND PLEURA: Low lung volumes. Pulmonary vascularity is accentuated.  Patchy airspace opacities in the right mid and lower lung with airspace opacities in the left lower lung. Small right pleural effusion. No pneumothorax. HEART AND MEDIASTINUM: Stable cardiomegaly. Aortic atherosclerotic calcification. The cardiomediastinal silhouette is accentuated. BONES AND SOFT TISSUES: No acute osseous abnormality. IMPRESSION: 1. Findings concerning for multifocal pneumonia. Small pleural effusion. Electronically signed by: Norman Gatlin MD 03/06/2024 09:59 PM EST RP Workstation: [id number]    Microbiology: Results for orders placed or performed during the hospital encounter of 03/08/24  MRSA Next Gen by PCR, Nasal     Status: None   Collection Time: 03/08/24  7:23 PM   Specimen: Nasal Mucosa; Nasal Swab  Result Value Ref Range Status   MRSA by PCR Next Gen NOT DETECTED NOT DETECTED Final    Comment: (NOTE) The GeneXpert MRSA Assay (FDA approved for NASAL specimens only), is one component of a comprehensive MRSA colonization surveillance program. It is not intended to diagnose MRSA infection nor to guide or monitor treatment for MRSA infections. Test performance is not FDA approved in patients less than 43 years old. Performed at [facility], [address] [place], [place] [address]   Culture, Respiratory w Gram Stain     Status: None   Collection Time: 03/12/24  8:21 AM   Specimen: Tracheal Aspirate; Respiratory  Result Value Ref Range Status   Specimen Description   Final    TRACHEAL ASPIRATE Performed at [facility], [address] [place], [place] [address]    Special Requests   Final    NONE Performed at [facility], [address] [place], [place] [address]    Gram Stain   Final    RARE WBC PRESENT,BOTH PMN AND MONONUCLEAR FEW GRAM POSITIVE RODS    Culture   Final    MODERATE METHICILLIN RESISTANT STAPHYLOCOCCUS AUREUS WITHIN MIXED FLORA Performed at [facility], [address], [place], [place] [address]     Report Status 03/15/2024 FINAL  Final   Organism ID, Bacteria METHICILLIN RESISTANT STAPHYLOCOCCUS AUREUS  Final      Susceptibility   Methicillin resistant staphylococcus aureus - MIC*    CIPROFLOXACIN >=8 RESISTANT Resistant     ERYTHROMYCIN >=8 RESISTANT Resistant     GENTAMICIN <=  0.5 SENSITIVE Sensitive     OXACILLIN >=4 RESISTANT Resistant     TETRACYCLINE <=1 SENSITIVE Sensitive     VANCOMYCIN  1 SENSITIVE Sensitive     TRIMETH/SULFA >=320 RESISTANT Resistant     CLINDAMYCIN <=0.25 SENSITIVE Sensitive     RIFAMPIN <=0.5 SENSITIVE Sensitive     Inducible Clindamycin NEGATIVE Sensitive     LINEZOLID  2 SENSITIVE Sensitive     * MODERATE METHICILLIN RESISTANT STAPHYLOCOCCUS AUREUS  MRSA Next Gen by PCR, Nasal     Status: Abnormal   Collection Time: 03/13/24 11:57 AM   Specimen: Nasal Mucosa; Nasal Swab  Result Value Ref Range Status   MRSA by PCR Next Gen DETECTED (A) NOT DETECTED Final    Comment: RESULT CALLED TO, READ BACK BY AND VERIFIED WITH: JORGE MORALES AT 1326 ON 03/13/24 BY SS (NOTE) The GeneXpert MRSA Assay (FDA approved for NASAL specimens only), is one component of a comprehensive MRSA colonization surveillance program. It is not intended to diagnose MRSA infection nor to guide or monitor treatment for MRSA infections. Test performance is not FDA approved in patients less than 72 years old. Performed at [facility], [address] [place], [place] [address]   Expectorated Sputum Assessment w Gram Stain, Rflx to Resp Cult     Status: None   Collection Time: 03/21/24  5:20 PM   Specimen: Throat; Sputum  Result Value Ref Range Status   Specimen Description THROAT  Final   Special Requests NONE  Final   Sputum evaluation   Final    Sputum specimen not acceptable for testing.  Please recollect.   RECOLLECT [facility] 1815 03/21/2024 [facility] Performed at [facility], [address] [place], [place] [address]    Report Status 03/21/2024 FINAL   Final    Labs: CBC: Recent Labs  Lab 03/30/24 0900 04/01/24 0538 04/02/24 0805 04/04/24 0748 04/05/24 0815  WBC 6.2  --  9.4 8.1 7.6  HGB 7.6* 8.3* 8.1* 7.9* 7.8*  HCT 24.1*  --  26.6* 25.7* 25.7*  MCV 102.1*  --  105.1* 105.8* 104.9*  PLT 176  --  191 186 151   Basic Metabolic Panel: Recent Labs  Lab 03/30/24 0900 04/01/24 0538 04/02/24 0805 04/04/24 0748 04/05/24 0529  NA 135 137 138 136 135  K 4.5 4.2 4.2 4.3 4.6  CL 98 100 100 100 98  CO2 28 26 27 27 28   GLUCOSE 94 109* 109* 102* 107*  BUN 40* 36* 41* 34* 27*  CREATININE 4.57* 4.37* 4.84* 4.32* 3.72*  CALCIUM  9.0 9.7 9.9 9.6 9.6  PHOS 4.1  --  4.4 4.5  --    Liver Function Tests: Recent Labs  Lab 03/30/24 0900 04/02/24 0805 04/04/24 0748  ALBUMIN 3.2* 3.5 3.4*   CBG: Recent Labs  Lab 03/29/24 1549 03/29/24 2217 03/30/24 2032  GLUCAP 116* 103* 110*    Discharge time spent: greater than 30 minutes.  Signed: Nena Rebel, MD [facility] 04/05/2024

## 2024-04-05 NOTE — Plan of Care (Signed)
°  Problem: Education: Goal: Knowledge of General Education information will improve Description: Including pain rating scale, medication(s)/side effects and non-pharmacologic comfort measures Outcome: Adequate for Discharge   Problem: Health Behavior/Discharge Planning: Goal: Ability to manage health-related needs will improve Outcome: Adequate for Discharge   Problem: Clinical Measurements: Goal: Ability to maintain clinical measurements within normal limits will improve Outcome: Adequate for Discharge Goal: Will remain free from infection Outcome: Adequate for Discharge Goal: Diagnostic test results will improve Outcome: Adequate for Discharge Goal: Respiratory complications will improve Outcome: Adequate for Discharge Goal: Cardiovascular complication will be avoided Outcome: Adequate for Discharge   Problem: Activity: Goal: Risk for activity intolerance will decrease Outcome: Adequate for Discharge   Problem: Nutrition: Goal: Adequate nutrition will be maintained Outcome: Adequate for Discharge   Problem: Coping: Goal: Level of anxiety will decrease Outcome: Adequate for Discharge   Problem: Elimination: Goal: Will not experience complications related to bowel motility Outcome: Adequate for Discharge Goal: Will not experience complications related to urinary retention Outcome: Adequate for Discharge   Problem: Pain Managment: Goal: General experience of comfort will improve and/or be controlled Outcome: Adequate for Discharge   Problem: Safety: Goal: Ability to remain free from injury will improve Outcome: Adequate for Discharge   Problem: Skin Integrity: Goal: Risk for impaired skin integrity will decrease Outcome: Adequate for Discharge   Problem: Education: Goal: Ability to describe self-care measures that may prevent or decrease complications (Diabetes Survival Skills Education) will improve Outcome: Adequate for Discharge Goal: Individualized Educational  Video(s) Outcome: Adequate for Discharge   Problem: Coping: Goal: Ability to adjust to condition or change in health will improve Outcome: Adequate for Discharge   Problem: Fluid Volume: Goal: Ability to maintain a balanced intake and output will improve Outcome: Adequate for Discharge   Problem: Health Behavior/Discharge Planning: Goal: Ability to identify and utilize available resources and services will improve Outcome: Adequate for Discharge Goal: Ability to manage health-related needs will improve Outcome: Adequate for Discharge   Problem: Metabolic: Goal: Ability to maintain appropriate glucose levels will improve Outcome: Adequate for Discharge   Problem: Nutritional: Goal: Maintenance of adequate nutrition will improve Outcome: Adequate for Discharge Goal: Progress toward achieving an optimal weight will improve Outcome: Adequate for Discharge   Problem: Skin Integrity: Goal: Risk for impaired skin integrity will decrease Outcome: Adequate for Discharge   Problem: Tissue Perfusion: Goal: Adequacy of tissue perfusion will improve Outcome: Adequate for Discharge   Problem: Education: Goal: Knowledge of disease and its progression will improve Outcome: Adequate for Discharge   Problem: Health Behavior/Discharge Planning: Goal: Ability to manage health-related needs will improve Outcome: Adequate for Discharge   Problem: Clinical Measurements: Goal: Complications related to the disease process or treatment will be avoided or minimized Outcome: Adequate for Discharge Goal: Dialysis access will remain free of complications Outcome: Adequate for Discharge   Problem: Activity: Goal: Activity intolerance will improve Outcome: Adequate for Discharge   Problem: Fluid Volume: Goal: Fluid volume balance will be maintained or improved Outcome: Adequate for Discharge   Problem: Nutritional: Goal: Ability to make appropriate dietary choices will improve Outcome:  Adequate for Discharge   Problem: Respiratory: Goal: Respiratory symptoms related to disease process will be avoided Outcome: Adequate for Discharge   Problem: Self-Concept: Goal: Body image disturbance will be avoided or minimized Outcome: Adequate for Discharge   Problem: Urinary Elimination: Goal: Progression of disease will be identified and treated Outcome: Adequate for Discharge

## 2024-04-05 NOTE — Progress Notes (Signed)
 Hemodialysis Note:  Received patient in bed to unit. Alert and oriented. Informed consent singed and in chart.  Treatment initiated: 0800 Treatment completed: 1115  Access used: Right internal jugular catheter Access issues: None  Patient tolerated well. Transported back to room, alert without acute distress. Report given to patient's RN.  Total UF removed: 1 liter Medications given: Midodrine  10 mg Tablet  Post HD weight: 113 Kg  [facility]

## 2024-04-05 NOTE — Progress Notes (Signed)
 This RN spoke with [name] (patient's daughter) about CPAP machine and teaching. Pt is scheduled for discharge today. Pt already wearing older generation of CPAP but needs fitted for new one. Technologist for company to return at 1530 to teach family and patient about CPAP. MD updated that pt will complete this today. Pt scheduled for outpatient dialysis on 12/16.   Pt on 1L [place] with O2 at 100, pt taken off and placed on room air. Pt O2 saturation 96-97%, MD aware and ok with patient being off oxygen  if O2 sat above 92%.

## 2024-04-06 ENCOUNTER — Other Ambulatory Visit: Payer: Self-pay

## 2024-04-10 ENCOUNTER — Other Ambulatory Visit: Payer: Self-pay

## 2024-04-11 ENCOUNTER — Other Ambulatory Visit: Payer: Self-pay

## 2024-04-11 MED ORDER — TRAZODONE HCL 50 MG PO TABS
50.0000 mg | ORAL_TABLET | Freq: Every evening | ORAL | 0 refills | Status: DC | PRN
  Filled 2024-04-11 – 2024-04-28 (×2): qty 30, 30d supply, fill #0

## 2024-04-11 MED ORDER — ZINC SULFATE 220 (50 ZN) MG PO TABS
220.0000 mg | ORAL_TABLET | Freq: Every day | ORAL | 0 refills | Status: DC
  Filled 2024-04-11 – 2024-04-28 (×2): qty 30, 30d supply, fill #0

## 2024-04-29 ENCOUNTER — Other Ambulatory Visit (HOSPITAL_COMMUNITY): Payer: Self-pay

## 2024-04-29 ENCOUNTER — Other Ambulatory Visit: Payer: Self-pay

## 2024-04-30 ENCOUNTER — Other Ambulatory Visit (HOSPITAL_COMMUNITY): Payer: Self-pay

## 2024-05-01 ENCOUNTER — Encounter: Payer: Self-pay | Admitting: Student in an Organized Health Care Education/Training Program

## 2024-05-01 ENCOUNTER — Other Ambulatory Visit: Payer: Self-pay

## 2024-05-01 ENCOUNTER — Ambulatory Visit (INDEPENDENT_AMBULATORY_CARE_PROVIDER_SITE_OTHER): Admitting: Student in an Organized Health Care Education/Training Program

## 2024-05-01 VITALS — BP 123/62 | HR 83 | Ht 69.0 in | Wt 262.0 lb

## 2024-05-01 DIAGNOSIS — I5032 Chronic diastolic (congestive) heart failure: Secondary | ICD-10-CM

## 2024-05-01 DIAGNOSIS — H6121 Impacted cerumen, right ear: Secondary | ICD-10-CM | POA: Insufficient documentation

## 2024-05-01 DIAGNOSIS — Z992 Dependence on renal dialysis: Secondary | ICD-10-CM | POA: Diagnosis not present

## 2024-05-01 DIAGNOSIS — F39 Unspecified mood [affective] disorder: Secondary | ICD-10-CM | POA: Diagnosis not present

## 2024-05-01 DIAGNOSIS — D472 Monoclonal gammopathy: Secondary | ICD-10-CM

## 2024-05-01 DIAGNOSIS — J42 Unspecified chronic bronchitis: Secondary | ICD-10-CM

## 2024-05-01 DIAGNOSIS — N186 End stage renal disease: Secondary | ICD-10-CM

## 2024-05-01 DIAGNOSIS — R5381 Other malaise: Secondary | ICD-10-CM | POA: Diagnosis not present

## 2024-05-01 MED ORDER — BUDESONIDE-FORMOTEROL FUMARATE 160-4.5 MCG/ACT IN AERO: 2.0000 | INHALATION_SPRAY | Freq: Two times a day (BID) | RESPIRATORY_TRACT | 5 refills | Status: AC

## 2024-05-01 MED ORDER — ROSUVASTATIN CALCIUM 10 MG PO TABS: 10.0000 mg | ORAL_TABLET | Freq: Every day | ORAL | 3 refills | Status: AC

## 2024-05-01 MED ORDER — SERTRALINE HCL 25 MG PO TABS: 25.0000 mg | ORAL_TABLET | Freq: Every morning | ORAL | 3 refills | Status: AC

## 2024-05-01 MED ORDER — TRAZODONE HCL 50 MG PO TABS: 50.0000 mg | ORAL_TABLET | Freq: Every evening | ORAL | 0 refills | Status: AC | PRN

## 2024-05-01 MED ORDER — IPRATROPIUM-ALBUTEROL 0.5-2.5 (3) MG/3ML IN SOLN: 3.0000 mL | Freq: Four times a day (QID) | RESPIRATORY_TRACT | 1 refills | Status: AC | PRN

## 2024-05-01 MED ORDER — MIDODRINE HCL 10 MG PO TABS: 10.0000 mg | ORAL_TABLET | Freq: Three times a day (TID) | ORAL | 2 refills | Status: AC

## 2024-05-01 NOTE — Progress Notes (Signed)
 "  New Patient Office Visit  Patient ID: Dave Brown, Male   DOB: February 04, 1946 79 y.o. MRN: [id number]  Chief Complaint  Patient presents with   Establish Care   Hospitalization Follow-up    On dialysis    Subjective:     Dave Brown presents to establish care  HPI  Discussed the use of AI scribe software for clinical note transcription with the patient, who gave verbal consent to proceed.  History of Present Illness Dave Brown is a 79 year old male with chronic kidney disease who presents for follow-up after starting dialysis. He is accompanied by his daughter, [name].  He was hospitalized in October and November 2025 and began dialysis in December 2025. He attends dialysis three times a week for four hours each session at [facility] in [place]. His blood pressure was initially unstable during dialysis, but adjusting the timing of his midodrine  has helped stabilize it. He currently uses a catheter for dialysis access. He experiences shortness of breath with minimal exertion, with no significant improvement on non-dialysis days.  He has a history of COPD and uses Symbicort  twice daily. He has not used his albuterol  rescue inhaler since returning home from the hospital. He monitors his oxygen  levels and uses supplemental oxygen  when levels drop below 90%.  He reports significant weight loss since his hospitalization, from 317 pounds in November 2025 to 261 pounds currently, primarily due to fluid removal. He is able to complete full dialysis sessions without needing to leave early.  He experiences pain in his leg and reports having had multiple lines and intubation during his hospitalization. He is undergoing physical therapy once a week and is working on increasing his activity level at home.  His current medications include midodrine , rosuvastatin , trazodone , sertraline , a B complex vitamin, and a multivitamin. He also uses a CPAP machine at night for sleep apnea.  He has a history of  high blood pressure, which he believes contributed to his kidney failure. He has not experienced any heart attacks or strokes. He reports a history of falls last year but none recently. He initially used a walker and then a cane after returning home from the hospital.  He lives with his son and has family support nearby. 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 He has experienced significant personal loss, including the death of his wife four years ago and a child.  He reports a cyst on his back, which has been present for some time. It is not currently causing him pain or other symptoms.   Outpatient Encounter Medications as of 05/01/2024  Medication Sig   albuterol  (PROVENTIL ) (2.5 MG/3ML) 0.083% nebulizer solution Take 2.5 mg by nebulization every 6 (six) hours as needed for shortness of breath or wheezing.   [DISCONTINUED] budesonide -formoterol  (SYMBICORT ) 160-4.5 MCG/ACT inhaler Inhale 2 puffs into the lungs 2 (two) times daily.   [DISCONTINUED] Dextromethorphan -guaiFENesin  20-200 MG/20ML LIQD Take 10 mLs by mouth every 4 (four) hours as needed for cough.   [DISCONTINUED] ipratropium-albuterol  (DUONEB) 0.5-2.5 (3) MG/3ML SOLN Take 3 mLs by nebulization every 6 (six) hours as needed.   [DISCONTINUED] midodrine  (PROAMATINE ) 10 MG tablet Take 1 tablet (10 mg total) by mouth 3 (three) times daily with meals.   [DISCONTINUED] midodrine  (PROAMATINE ) 10 MG tablet Take 1 tablet (10 mg total) by mouth every dialysis (hypotension).   [DISCONTINUED] multivitamin (RENA-VIT) TABS tablet Take 1 tablet by mouth at bedtime.   [DISCONTINUED] Nutritional Supplements (FEEDING SUPPLEMENT,  NEPRO CARB STEADY,) LIQD Take 237 mLs by mouth 3 (three) times daily between meals.   [DISCONTINUED] rosuvastatin  (CRESTOR ) 10 MG tablet Take 10 mg by mouth daily.   [DISCONTINUED] sertraline  (ZOLOFT ) 25 MG tablet Take 25 mg by mouth in the morning.   [DISCONTINUED] traZODone  (DESYREL ) 50 MG  tablet Take 1 tablet (50 mg total) by mouth at bedtime as needed for sleep.   [DISCONTINUED] TYLENOL  325 MG tablet Take 325-650 mg by mouth every 8 (eight) hours as needed for mild pain (pain score 1-3) (or headaches).   [DISCONTINUED] Zinc  Sulfate 220 (50 Zn) MG TABS Take 1 tablet (220 mg total) by mouth daily.   budesonide -formoterol  (SYMBICORT ) 160-4.5 MCG/ACT inhaler Inhale 2 puffs into the lungs 2 (two) times daily.   ipratropium-albuterol  (DUONEB) 0.5-2.5 (3) MG/3ML SOLN Take 3 mLs by nebulization every 6 (six) hours as needed.   midodrine  (PROAMATINE ) 10 MG tablet Take 1 tablet (10 mg total) by mouth 3 (three) times daily with meals.   rosuvastatin  (CRESTOR ) 10 MG tablet Take 1 tablet (10 mg total) by mouth daily.   sertraline  (ZOLOFT ) 25 MG tablet Take 1 tablet (25 mg total) by mouth in the morning.   traZODone  (DESYREL ) 50 MG tablet Take 1 tablet (50 mg total) by mouth at bedtime as needed for sleep.   No facility-administered encounter medications on file as of 05/01/2024.    Past Medical History:  Diagnosis Date   CHF (congestive heart failure) (HCC)    Degenerative joint disease    High cholesterol    Hypertension    Kidney stones    Obesity    Respiratory failure with hypoxia (HCC)    Sleep apnea     Past Surgical History:  Procedure Laterality Date   DIALYSIS/PERMA CATHETER INSERTION N/A 03/27/2024   Procedure: DIALYSIS/PERMA CATHETER INSERTION;  Surgeon: Marea Selinda RAMAN, MD;  Location: [facility] INVASIVE CV LAB;  Service: Cardiovascular;  Laterality: N/A;    History reviewed. No pertinent family history.    Objective:    BP 123/62   Pulse 83   Ht 5' 9 (1.753 m)   Wt 262 lb (118.8 kg)   SpO2 98%   BMI 38.69 kg/m   Physical Exam  Gen: Moderately frail appearing older man Ears: Right ear impacted with cerumen, this was removed with curette, some inflammation and bleeding at the ear canal afterwards is minor, tympanic membrane appeared normal bilaterally Neck:  Increased circumference, normal thyroid, no nodules or adenopathy Heart: Regular, no murmur Lungs: Unlabored, clear throughout with no crackles Back: 3 cm inclusion cyst on the right side of the mid back is mildly erythematous but nontender to touch with no drainage Abd: Obese, soft, nontender Ext: Warm, no edema, some discomfort to palpation at the left groin where he had prior Vas-Cath access. MSK: Moderate diffuse osteoarthritis, limited range of motion in the left hip on internal rotation but not much discomfort Neuro: Alert, deconditioned, diffuse sarcopenia, delayed get up and go, wide-based forward leaning gait without assist device, unsteady getting up onto the table, full strength upper and lower extremities     Assessment & Plan:   Problem List Items Addressed This Visit       High   Chronic heart failure with preserved ejection fraction (HCC) (Chronic)   Chronic and stable.  No history of ischemic heart disease.  Volume status is now being managed with hemodialysis.  Looks euvolemic today on exam.  Lots of physical deconditioning from recent prolonged hospitalization.  Currently has  NYHA class II symptoms.  Previously had hypertension but now has been struggling with hypotension on HD days.  Antihypertensives have been discontinued and he is using midodrine  for blood pressure support at this point.      Relevant Medications   midodrine  (PROAMATINE ) 10 MG tablet   rosuvastatin  (CRESTOR ) 10 MG tablet   COPD (chronic obstructive pulmonary disease) (HCC) (Chronic)   His COPD is managed with Symbicort . Continue Symbicort  inhaler twice daily and monitor respiratory symptoms, using a rescue inhaler as needed.  Symbicort  and DuoNebs refilled.  I think his respiratory status is gena be much improved since starting dialysis.  Still need to work on deconditioning which he is doing through home health PT.      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  He undergoes hemodialysis three times a week, with blood pressure support by midodrine . Significant weight loss suggests improved fluid management, but the current catheter access increases infection risk. Continue hemodialysis thrice weekly and adjust midodrine  timing to before sessions. Consider transitioning to a fistula for long-term access. Monitor for infection signs such as fever, chills, and confusion. Adherence to the dialysis schedule is encouraged.  Overall he seems to be doing very well on dialysis, adjusting to the new schedule well, certainly helping to manage his volume status well.  Still urinates about 3 times daily.      Relevant Medications   midodrine  (PROAMATINE ) 10 MG tablet     Medium    Physical deconditioning (Chronic)   Acute issue since the prolonged ICU hospitalization in December.  Working with home health physical therapy and making some good progress.  Has support from family, lives with his son.  No recent falls or safety concerns.  I recommend he continue with physical therapy, follow-up with me in 6 weeks to check on his progress.  We talked about our goals of maximizing quality of life and functional abilities.      Mood disorder (Chronic)   Chronic and stable.  Adjusting very well since prolonged hospitalization.  Has had a very difficult 4-year since the death of his wife.  Currently doing well on sertraline  daily and uses trazodone  at night to help with sleep.  No side effects to these medications.  I have refilled them.      Relevant Medications   sertraline  (ZOLOFT ) 25 MG tablet   traZODone  (DESYREL ) 50 MG tablet     Low   Impacted cerumen, right ear   Procedure Note: Manual Removal of Impacted Cerumen Using a  Curette   Indication:  Cerumen impaction in the right ear causing symptoms (e.g., hearing loss, pain, tinnitus) and preventing assessment of the ear canal and tympanic membrane.  Procedure:  Explained the procedure to the patient and informed consent was obtained  Review patient history for contraindications (e.g., nonintact tympanic membrane, history of ear surgery, anatomical abnormalities).  After a position of the patient's head upright, I visualized the ear canal and cerumen using an otoscope.  I gently inserted the curette into the ear canal avoiding contact with the canal walls, and carefully scooped the cerumen, removing it in small pieces.  I reassessed the ear canal and tympanic membrane, there was no residual cerumen, small amount of inflammation and bleeding in  the right ear canal is likely to improve in the next few days.  Follow-Up:  I instructed the patient to report any persistent symptoms such as pain, discharge, or hearing loss.  Schedule a follow-up appointment if necessary.       Other Visit Diagnoses       Monoclonal paraproteinemia       Relevant Medications   rosuvastatin  (CRESTOR ) 10 MG tablet        Return in about 6 weeks (around 06/12/2024).   Cleatus [name] Specking, MD [facility]     "

## 2024-05-01 NOTE — Assessment & Plan Note (Signed)
 Procedure Note: Manual Removal of Impacted Cerumen Using a Curette   Indication:  Cerumen impaction in the right ear causing symptoms (e.g., hearing loss, pain, tinnitus) and preventing assessment of the ear canal and tympanic membrane.  Procedure:  Explained the procedure to the patient and informed consent was obtained  Review patient history for contraindications (e.g., nonintact tympanic membrane, history of ear surgery, anatomical abnormalities).  After a position of the patient's head upright, I visualized the ear canal and cerumen using an otoscope.  I gently inserted the curette into the ear canal avoiding contact with the canal walls, and carefully scooped the cerumen, removing it in small pieces.  I reassessed the ear canal and tympanic membrane, there was no residual cerumen, small amount of inflammation and bleeding in the right ear canal is likely to improve in the next few days.  Follow-Up:  I instructed the patient to report any persistent symptoms such as pain, discharge, or hearing loss.  Schedule a follow-up appointment if necessary.

## 2024-05-01 NOTE — Assessment & Plan Note (Signed)
 Chronic and stable.  No history of ischemic heart disease.  Volume status is now being managed with hemodialysis.  Looks euvolemic today on exam.  Lots of physical deconditioning from recent prolonged hospitalization.  Currently has NYHA class II symptoms.  Previously had hypertension but now has been struggling with hypotension on HD days.  Antihypertensives have been discontinued and he is using midodrine  for blood pressure support at this point.

## 2024-05-01 NOTE — Assessment & Plan Note (Signed)
 His COPD is managed with Symbicort . Continue Symbicort  inhaler twice daily and monitor respiratory symptoms, using a rescue inhaler as needed.  Symbicort  and DuoNebs refilled.  I think his respiratory status is gena be much improved since starting dialysis.  Still need to work on deconditioning which he is doing through home health PT.

## 2024-05-01 NOTE — Assessment & Plan Note (Signed)
 Chronic kidney disease due to difficult to control hypertension, now ESRD on HD through a right sided temporary line since December.  He undergoes hemodialysis three times a week, with blood pressure support by midodrine . Significant weight loss suggests improved fluid management, but the current catheter access increases infection risk. Continue hemodialysis thrice weekly and adjust midodrine  timing to before sessions. Consider transitioning to a fistula for long-term access. Monitor for infection signs such as fever, chills, and confusion. Adherence to the dialysis schedule is encouraged.  Overall he seems to be doing very well on dialysis, adjusting to the new schedule well, certainly helping to manage his volume status well.  Still urinates about 3 times daily.

## 2024-05-01 NOTE — Patient Instructions (Signed)
" °  VISIT SUMMARY: Dave Brown, a 79 year old male with chronic kidney disease, came in for a follow-up after starting dialysis. He has been attending dialysis sessions three times a week and has experienced significant weight loss due to fluid removal. He also has a history of COPD, hypertension, hyperlipidemia, osteoarthritis, and obstructive sleep apnea. He is currently managing his conditions with various medications and physical therapy.  YOUR PLAN: -END-STAGE RENAL DISEASE ON HEMODIALYSIS: End-stage renal disease is the final stage of chronic kidney disease where the kidneys can no longer function on their own. You will continue hemodialysis three times a week, and your blood pressure will be managed with midodrine  taken before sessions. We will consider transitioning to a fistula for long-term access to reduce infection risk. Please monitor for signs of infection such as fever, chills, and confusion, and adhere to your dialysis schedule.  -CHRONIC OBSTRUCTIVE PULMONARY DISEASE (COPD): COPD is a chronic inflammatory lung disease that obstructs airflow from the lungs. Continue using your Symbicort  inhaler twice daily and monitor your respiratory symptoms. Use your rescue inhaler as needed.  -HYPERTENSION: Hypertension is high blood pressure. Continue taking midodrine  as prescribed to prevent low blood pressure during dialysis.  -HYPERLIPIDEMIA: Hyperlipidemia is having high levels of fats (lipids) in the blood. Continue taking rosuvastatin  as prescribed to manage your cholesterol levels.  -OSTEOARTHRITIS OF HIP: Osteoarthritis is a type of arthritis that occurs when flexible tissue at the ends of bones wears down. Continue your physical therapy exercises at home and try to walk and stay mobile as much as you can tolerate.  -OBSTRUCTIVE SLEEP APNEA: Obstructive sleep apnea is a condition where breathing repeatedly stops and starts during sleep. Continue using your CPAP machine at night and monitor  your oxygen  levels and compliance.  -GENERAL HEALTH MAINTENANCE: We discussed the importance of physical activity and monitoring for infections. Please stay as active as you can and watch for any signs of infection, reporting any symptoms immediately.  INSTRUCTIONS: Please continue attending your dialysis sessions three times a week and take your midodrine  before each session. Monitor for any signs of infection and report them immediately. Continue using your Symbicort  inhaler twice daily and your CPAP machine at night. Keep up with your physical therapy exercises and try to stay as active as possible. Follow up with us  as scheduled for your next appointment.  "

## 2024-05-01 NOTE — Assessment & Plan Note (Signed)
 Chronic and stable.  Adjusting very well since prolonged hospitalization.  Has had a very difficult 4-year since the death of his wife.  Currently doing well on sertraline  daily and uses trazodone  at night to help with sleep.  No side effects to these medications.  I have refilled them.

## 2024-05-01 NOTE — Assessment & Plan Note (Signed)
 Acute issue since the prolonged ICU hospitalization in December.  Working with home health physical therapy and making some good progress.  Has support from family, lives with his son.  No recent falls or safety concerns.  I recommend he continue with physical therapy, follow-up with me in 6 weeks to check on his progress.  We talked about our goals of maximizing quality of life and functional abilities.

## 2024-06-12 ENCOUNTER — Ambulatory Visit

## 2024-06-12 ENCOUNTER — Ambulatory Visit: Admitting: Student in an Organized Health Care Education/Training Program
